# Patient Record
Sex: Female | Born: 1980
Health system: Southern US, Community
[De-identification: ages and names within clinical notes are randomized; demographics above are authoritative.]

## PROBLEM LIST (undated history)

## (undated) ENCOUNTER — Ambulatory Visit (HOSPITAL_COMMUNITY): Admission: EM

## (undated) ENCOUNTER — Ambulatory Visit: Admission: EM | Source: Home / Self Care

## (undated) DIAGNOSIS — D649 Anemia, unspecified: Secondary | ICD-10-CM

## (undated) DIAGNOSIS — F419 Anxiety disorder, unspecified: Secondary | ICD-10-CM

## (undated) DIAGNOSIS — K219 Gastro-esophageal reflux disease without esophagitis: Secondary | ICD-10-CM

## (undated) DIAGNOSIS — F410 Panic disorder [episodic paroxysmal anxiety] without agoraphobia: Secondary | ICD-10-CM

## (undated) DIAGNOSIS — R42 Dizziness and giddiness: Secondary | ICD-10-CM

## (undated) HISTORY — PX: NO PAST SURGERIES: SHX2092

## (undated) HISTORY — DX: Gastro-esophageal reflux disease without esophagitis: K21.9

---
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
## 2002-01-24 ENCOUNTER — Inpatient Hospital Stay (HOSPITAL_COMMUNITY): Admission: AD | Admit: 2002-01-24 | Discharge: 2002-01-27 | Payer: Self-pay | Admitting: Obstetrics

## 2002-12-31 ENCOUNTER — Other Ambulatory Visit: Admission: RE | Admit: 2002-12-31 | Discharge: 2002-12-31 | Payer: Self-pay | Admitting: Obstetrics and Gynecology

## 2003-07-11 ENCOUNTER — Inpatient Hospital Stay (HOSPITAL_COMMUNITY): Admission: AD | Admit: 2003-07-11 | Discharge: 2003-07-11 | Payer: Self-pay | Admitting: Obstetrics and Gynecology

## 2003-07-11 ENCOUNTER — Inpatient Hospital Stay (HOSPITAL_COMMUNITY): Admission: AD | Admit: 2003-07-11 | Discharge: 2003-07-13 | Payer: Self-pay | Admitting: Obstetrics and Gynecology

## 2003-10-24 ENCOUNTER — Emergency Department (HOSPITAL_COMMUNITY): Admission: EM | Admit: 2003-10-24 | Discharge: 2003-10-24 | Payer: Self-pay | Admitting: Emergency Medicine

## 2004-06-15 ENCOUNTER — Emergency Department (HOSPITAL_COMMUNITY): Admission: EM | Admit: 2004-06-15 | Discharge: 2004-06-15 | Payer: Self-pay | Admitting: Emergency Medicine

## 2004-06-21 ENCOUNTER — Inpatient Hospital Stay (HOSPITAL_COMMUNITY): Admission: AD | Admit: 2004-06-21 | Discharge: 2004-06-21 | Payer: Self-pay | Admitting: Obstetrics and Gynecology

## 2004-06-26 ENCOUNTER — Emergency Department (HOSPITAL_COMMUNITY): Admission: EM | Admit: 2004-06-26 | Discharge: 2004-06-26 | Payer: Self-pay | Admitting: Emergency Medicine

## 2004-07-16 ENCOUNTER — Inpatient Hospital Stay (HOSPITAL_COMMUNITY): Admission: AD | Admit: 2004-07-16 | Discharge: 2004-07-16 | Payer: Self-pay | Admitting: Obstetrics and Gynecology

## 2004-09-17 ENCOUNTER — Emergency Department (HOSPITAL_COMMUNITY): Admission: EM | Admit: 2004-09-17 | Discharge: 2004-09-17 | Payer: Self-pay | Admitting: Emergency Medicine

## 2004-09-19 ENCOUNTER — Ambulatory Visit: Payer: Self-pay | Admitting: *Deleted

## 2004-09-19 ENCOUNTER — Ambulatory Visit: Payer: Self-pay

## 2006-12-18 ENCOUNTER — Inpatient Hospital Stay (HOSPITAL_COMMUNITY): Admission: AD | Admit: 2006-12-18 | Discharge: 2006-12-18 | Payer: Self-pay | Admitting: Family Medicine

## 2007-03-24 ENCOUNTER — Emergency Department (HOSPITAL_COMMUNITY): Admission: EM | Admit: 2007-03-24 | Discharge: 2007-03-24 | Payer: Self-pay | Admitting: Emergency Medicine

## 2007-06-28 ENCOUNTER — Inpatient Hospital Stay (HOSPITAL_COMMUNITY): Admission: AD | Admit: 2007-06-28 | Discharge: 2007-06-28 | Payer: Self-pay | Admitting: Obstetrics

## 2007-07-17 ENCOUNTER — Inpatient Hospital Stay (HOSPITAL_COMMUNITY): Admission: AD | Admit: 2007-07-17 | Discharge: 2007-07-17 | Payer: Self-pay | Admitting: Obstetrics

## 2007-07-31 ENCOUNTER — Ambulatory Visit (HOSPITAL_COMMUNITY): Admission: RE | Admit: 2007-07-31 | Discharge: 2007-07-31 | Payer: Self-pay | Admitting: Obstetrics and Gynecology

## 2007-09-08 ENCOUNTER — Ambulatory Visit (HOSPITAL_COMMUNITY): Admission: RE | Admit: 2007-09-08 | Discharge: 2007-09-08 | Payer: Self-pay | Admitting: Obstetrics and Gynecology

## 2007-09-13 ENCOUNTER — Ambulatory Visit: Payer: Self-pay | Admitting: Obstetrics and Gynecology

## 2007-09-13 ENCOUNTER — Inpatient Hospital Stay (HOSPITAL_COMMUNITY): Admission: AD | Admit: 2007-09-13 | Discharge: 2007-09-13 | Payer: Self-pay | Admitting: Internal Medicine

## 2007-10-10 ENCOUNTER — Ambulatory Visit (HOSPITAL_COMMUNITY): Admission: RE | Admit: 2007-10-10 | Discharge: 2007-10-10 | Payer: Self-pay | Admitting: Obstetrics and Gynecology

## 2008-01-07 ENCOUNTER — Inpatient Hospital Stay (HOSPITAL_COMMUNITY): Admission: RE | Admit: 2008-01-07 | Discharge: 2008-01-09 | Payer: Self-pay | Admitting: Obstetrics and Gynecology

## 2008-03-11 ENCOUNTER — Emergency Department (HOSPITAL_COMMUNITY): Admission: EM | Admit: 2008-03-11 | Discharge: 2008-03-11 | Payer: Self-pay | Admitting: Emergency Medicine

## 2008-04-13 ENCOUNTER — Emergency Department (HOSPITAL_COMMUNITY): Admission: EM | Admit: 2008-04-13 | Discharge: 2008-04-13 | Payer: Self-pay | Admitting: Emergency Medicine

## 2008-04-17 IMAGING — US US OB DETAIL+14 WK
1 series · 14 of 28 positions shown · non-contrast
Comparison: none

OBSTETRICAL ULTRASOUND:
 This ultrasound was performed in The [HOSPITAL], and the [facility] report will be stored to [REDACTED] PACS.

[Series 1: us ob detail+14 wk · 14 of 83 slices shown]
[im 4/83]
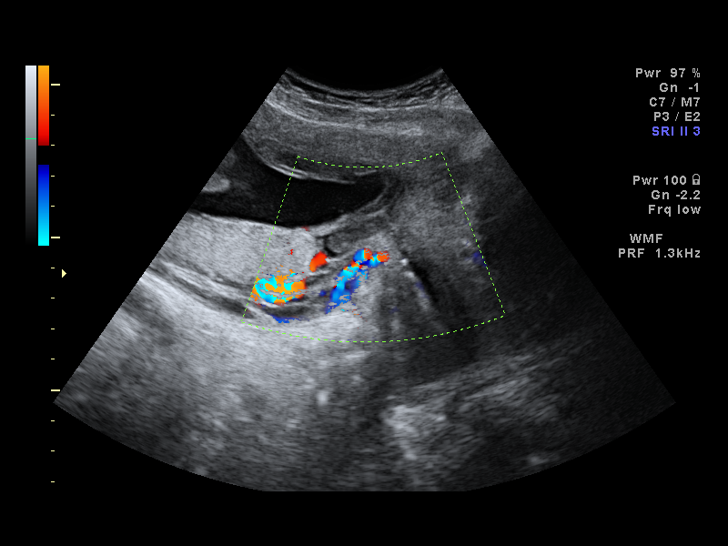
[im 10/83]
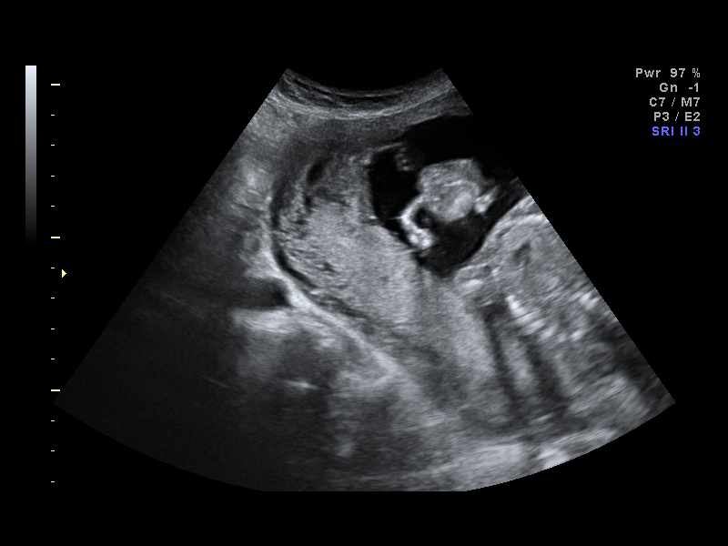
[im 16/83]
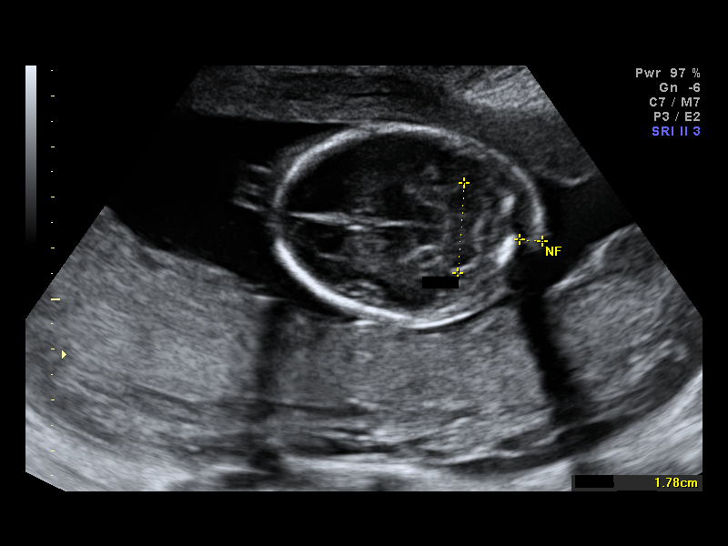
[im 22/83]
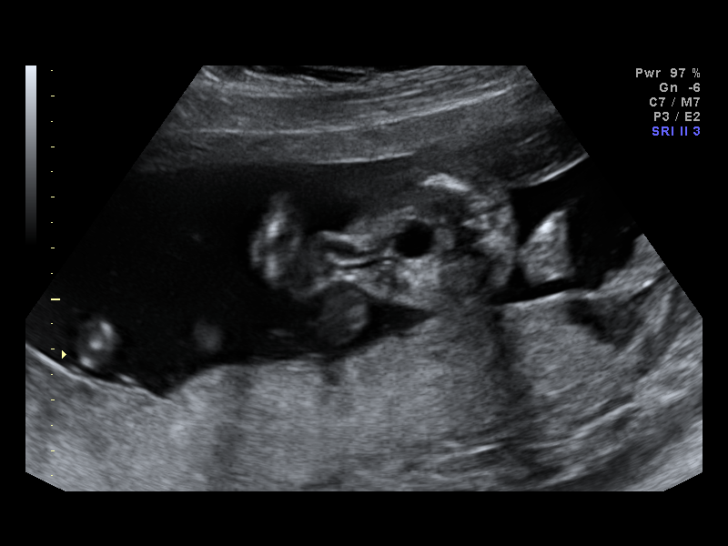
[im 28/83]
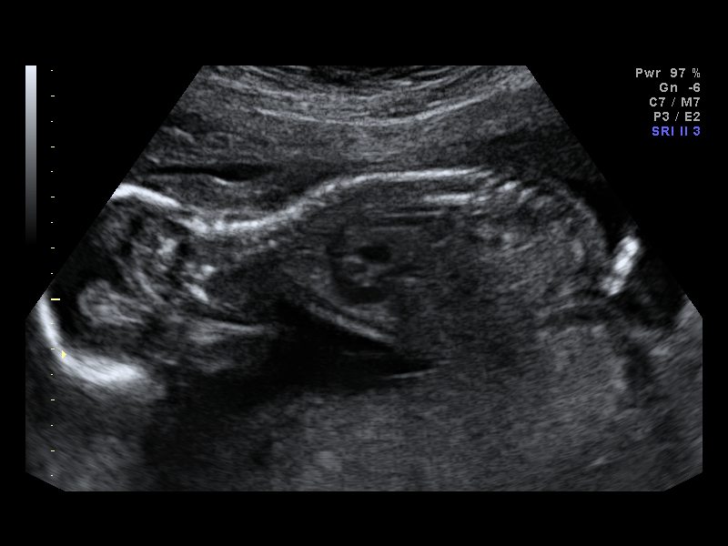
[im 34/83]
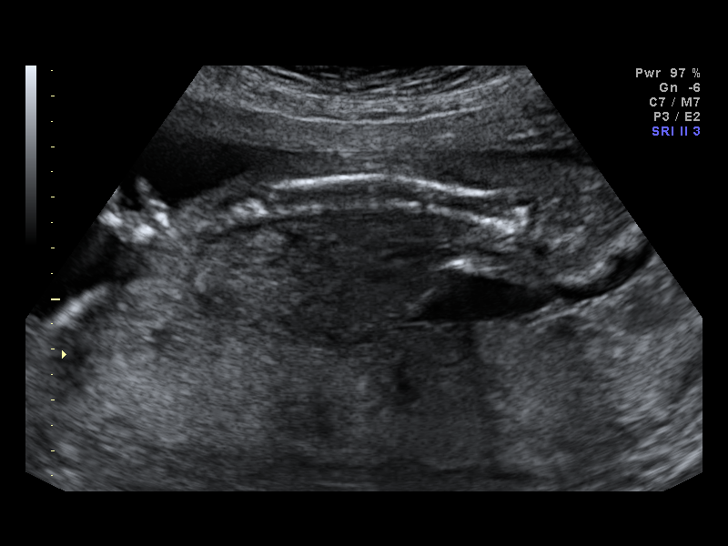
[im 40/83]
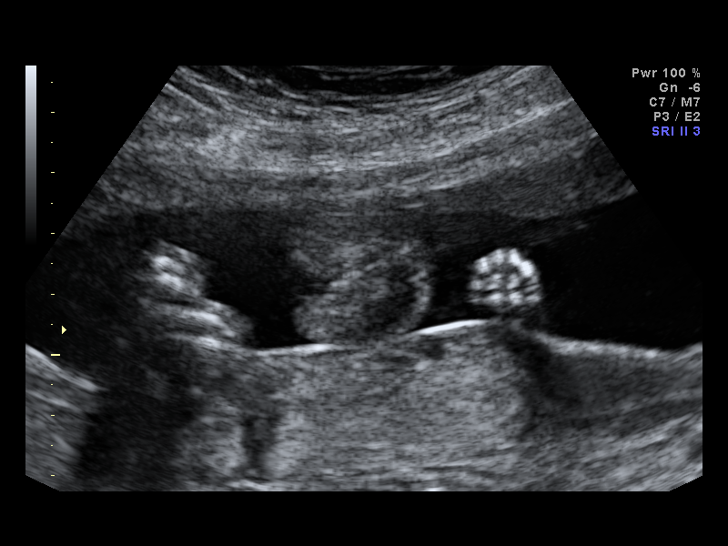
[im 46/83]
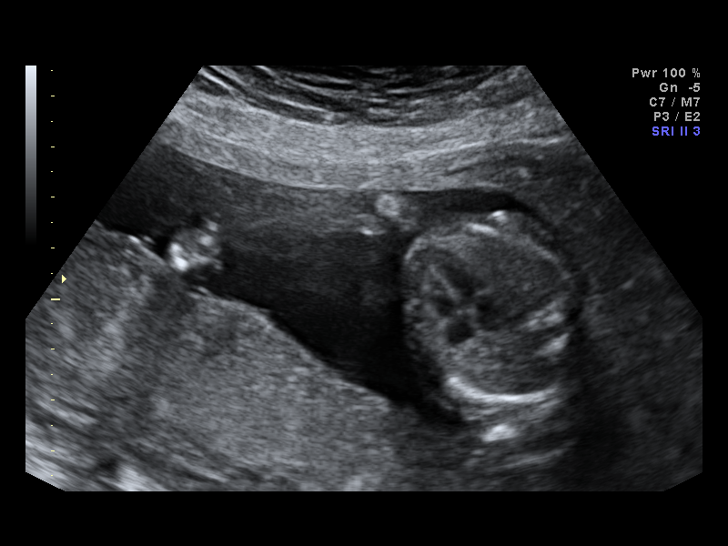
[im 52/83]
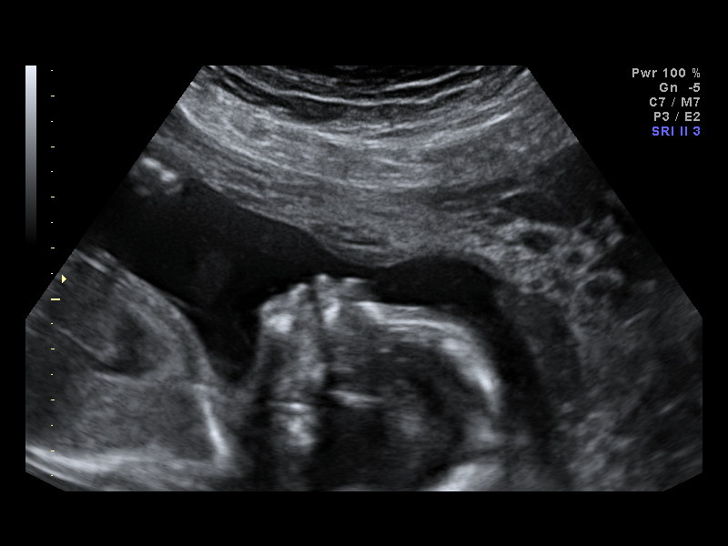
[im 58/83]
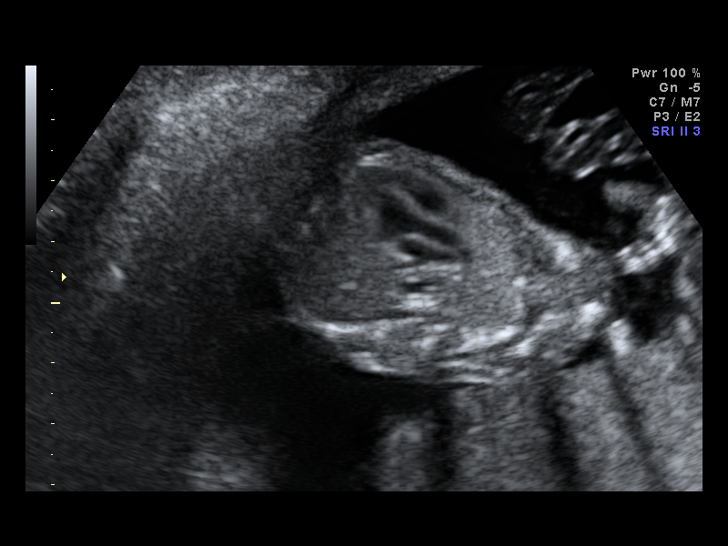
[im 64/83]
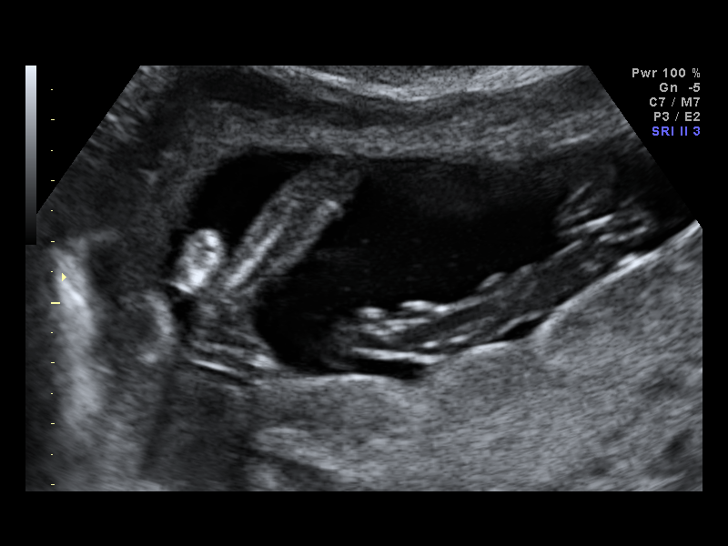
[im 70/83]
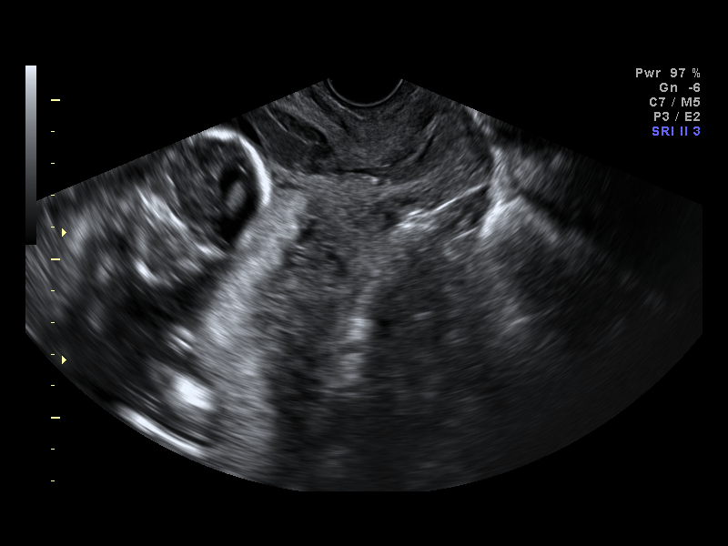
[im 76/83]
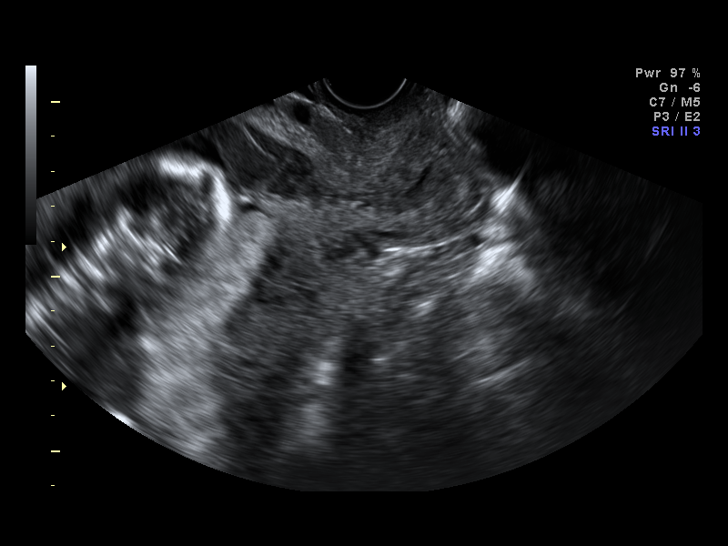
[im 83/83]
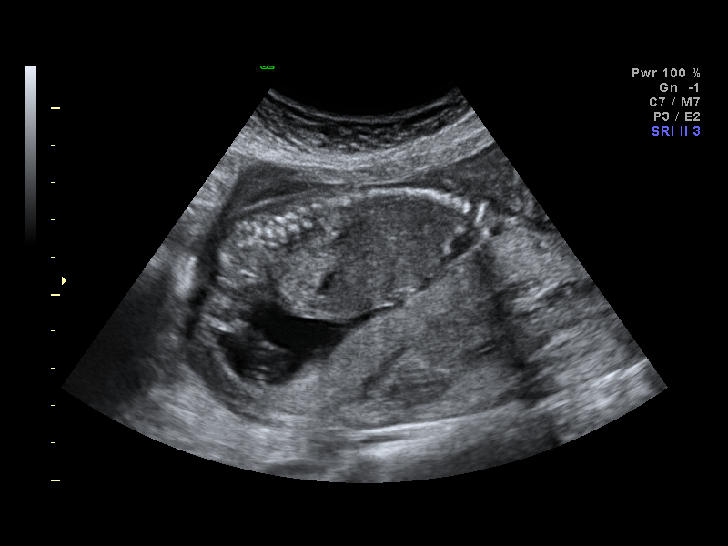

[14 of 28 positions shown; findings below may reference images not displayed]

IMPRESSION: The [facility] report has also been faxed to the ordering physician.

## 2008-04-19 ENCOUNTER — Emergency Department (HOSPITAL_COMMUNITY): Admission: EM | Admit: 2008-04-19 | Discharge: 2008-04-19 | Payer: Self-pay | Admitting: Family Medicine

## 2010-08-13 ENCOUNTER — Encounter: Payer: Self-pay | Admitting: Obstetrics and Gynecology

## 2010-12-05 NOTE — H&P (Signed)
NAMEDEMYA, SCRUGGS            ACCOUNT NO.:  [id number]   MEDICAL RECORD NO.:  [id number]          PATIENT TYPE:  INP   LOCATION:  9164                          FACILITY:  [facility]   PHYSICIAN:  Naima A. Dillard, M.D. DATE OF BIRTH:  11/10/1980   DATE OF ADMISSION:  01/07/2008  DATE OF DISCHARGE:                              HISTORY & PHYSICAL   Ms. Suman is a 30 year old gravida 4, para 2-0-1-2 at 40-6/7 weeks,  who presents for induction secondary to post dates.  She reports  positive fetal movement.  Denies any leaking or bleeding.  Denies any  PIH symptoms.  Her pregnancy has been remarkable for:  1.  Positive  group B Streptococcus; 2.  Marginal cord insertion; 3.  History of a  previa.   PRENATAL LABORATORIES:  Blood type is O positive, Rh antibody negative,  VDRL nonreactive, rubella titer positive, hepatitis B surface antigen  negative, HIV nonreactive.  Sickle test was negative.  Pap was normal in  November 2008.  Quadruple screen was normal.  The patient had a PT, PTT,  and INR done in January 2009 which were normal.  I am unsure of why  those were done.  She had a normal Glucola.  Group B Streptococcus  culture was positive at 36 weeks.   HISTORY OF PRESENT PREGNANCY:  The patient entered care at approximately  28 weeks at [facility].  She had had an ultrasound at [facility] just prior to that time, with estimated fetal weight at  54th percentile, posterior placenta, with a marginal cord insertion.  She had been followed at Dr. Elsie Stain office prior to transferring to  [facility].  At 35 weeks, she had another ultrasound at [facility].  There was a marginal cord insertion.  Still, estimated fetal  weight was at the 63rd percentile, with normal fluid. She had a Glucola  at 36 weeks, because she had not had it at Dr. Elsie Stain office. GC and  Chlamydia cultures were negative at 35 weeks.  She had some decreased  fetal movement at 37  weeks.  She had an NST that was reactive.  Cervix  at that time was 1, 50%, vertex -2.  The cervical exam that Dr. Normand Sloop  performed approximately 1 week ago showed her cervix to be 3 cm.  She  has had no other complications.   OBSTETRICAL HISTORY:  In 2003, she had a vaginal birth of a female infant,  weight 8 pounds 6 ounces, at 41 weeks.  She had a not long labor.  She  had epidural anesthesia, but had no complications.  In 2004, she had a  vaginal birth of a female infant, weight 6 pounds, at 41 weeks.  She had a  not long labor, per her report. Epidural anesthesia.  She had no  complications.  In 2006, she had a 5-week TAB in [place].   MEDICAL HISTORY:  1. She is a previous patch and Yasmin user.  2. She reports the usual childhood illnesses.  3. She has had occasional UTIs.   SURGICAL HISTORY:  Wisdom teeth x4.  Only other hospitalization was for childbirth.   She has no known medication allergies.   FAMILY HISTORY:  Her mother has hypertension.  Both her son have sickle  cell trait.  Her mother has chronic bronchitis.  Her father has  seizures.  Her paternal grandmother has Alzheimer's.  Her mother has  breast cancer.  She is not sure of the timing of that diagnosis.   GENETIC HISTORY:  Remarkable the patient's brother having some type of  spina bifida, and the patient's sons x2 with sickle cell trait.   SOCIAL HISTORY:  The patient is single.  Father of the baby is involved  and supportive.  His name is [employer]. The patient has 2 years  of college.  She is employed at a daycare.  Her partner has his GED.  He  is a Nature conservation officer.  She has been followed by the physician service at [facility].  She denies any alcohol, drug, or tobacco use during this  pregnancy.   PHYSICAL EXAMINATION:  VITAL SIGNS:  Stable.  The patient is febrile.  HEENT: Within normal limits.  LUNGS:  Breath sounds are clear.  HEART:  Regular rate and rhythm, without murmur.  BREASTS:   Soft and nontender.  ABDOMEN:  Fundal height is approximately 39 cm.  Estimated fetal weight  is 7-8 pounds.  PELVIC:  Uterine contractions are occasional and mild.  Cervix on the  last exam was 3 cm, vertex by Dr. Redmond Baseman exam.  Fetal heart rate is  reactive.  There was one mild variable noted with a contraction.  Contractions are occasional and mild.  EXTREMITIES:  Deep tendon reflexes are 2+, without clonus.  There is a  trace edema noted.   IMPRESSION:  1. Intrauterine pregnancy at 40-6/7 weeks.  2. Post dates.  3. Positive group B Streptococcus.  4. Marginal cord insertion.   PLAN:  1. Admit to birthing suite for consult with Dr. Jaymes Graff as      attending physician.  2. Routine physician orders.  3. Plan group B Streptococcus prophylaxis with penicillin G.  4. The patient plans epidural as labor progresses.      Renaldo Reel Emilee Hero, C.N.M.      Naima A. Normand Sloop, M.D.  Electronically Signed    VLL/MEDQ  D:  01/07/2008  T:  01/07/2008  Job:  [id number]

## 2010-12-08 NOTE — H&P (Signed)
NAME:  Breanna Thomas, Breanna Thomas                      ACCOUNT NO.:  [id number]   MEDICAL RECORD NO.:  [id number]                   PATIENT TYPE:   LOCATION:                                       [facility]:  [facility]   PHYSICIAN:  Naima A. Dillard, M.D.              DATE OF BIRTH:  06-28-1981   DATE OF ADMISSION:  07/11/2003  DATE OF DISCHARGE:                                HISTORY & PHYSICAL   HISTORY:  Breanna Thomas is a 30 year old single black female gravida 2, para  1-0-0-1 at 40-6/7 who presents with uterine contractions every three to five  minutes for one hour.  She denies bleeding, leaking, headache, nausea and  vomiting, visual disturbances.  She reports positive fetal movement.  She  rates her pain as a 4/10 on the pain scale now.  Her pregnancy has been  followed by the [facility] OB[facility]GYN M.D. service and has been remarkable  for questionable last menstrual period, history of frequent UTIs, group B  Strep negative.  She was evaluated for labor at noon today and her cervical  examination was 2 and 60% with no change after ambulation.  Upon arrival to  the hospital this evening, her cervix was 2.5 cm, 70%, vertex -2/-3 with  intact bag of waters.  She declined ambulation and also declined going home  because of the pain.  The patient spoke with Dr. Normand Sloop and was offered  morphine rest.  Her prenatal laboratories were collected on December 31, 2002.  Hemoglobin 10.1, hematocrit 32.0, platelets 280,000.  Blood type O+.  Antibody negative.  RPR nonreactive.  Rubella immune.  Hepatitis B surface  antigen negative.  HIV nonreactive.  Pap within normal limits.  Gonorrhea  negative.  Chlamydia negative.  February 08, 2003 her quad screen was within  normal limits.  Her one-hour Glucola on January 04, 2003 was 143 and her  hemoglobin at that time was 9.1.  Her three-hour glucose tolerance test on  May 21, 2003 was within normal limits.  Culture of the vaginal tract for  group B Strep in November  was negative.   HISTORY OF PRESENT PREGNANCY:  She presented for care at [facility] on  December 31, 2002 at [redacted] weeks gestation.  Pregnancy ultrasonography at [redacted] weeks  gestation showed growth consistent with [facility] of July 05, 2003.  Pregnancy  ultrasonography at [redacted] weeks gestation.  __________ dates showed EFW in the  50th-75th percentile with normal fluid.  Rest of her prenatal care was  unremarkable.   PAST OBSTETRICAL HISTORY:  She is a gravida 2, para 1-0-0-1.  In July of  2003, she vaginally delivered a female infant at [redacted] weeks gestation weighing 8  pounds 6 ounces after 16 hours of labor.  She had an epidural for anesthesia  and there were no complications.   ALLERGIES:  She has no medication allergies.   PAST MEDICAL HISTORY:  She reports having had the  usual childhood illnesses.  She has frequent urinary tract infections.   PAST SURGICAL HISTORY:  Wisdom teeth extraction at the age of 52.   FAMILY HISTORY:  Remarkable for parents with history of hypertension, father  with history of seizure, mother with breast cancer.   GENETIC HISTORY:  Remarkable for father of the baby with positive sickle  cell trait.   SOCIAL HISTORY:  The patient is single.  Father of the baby is involved.  His name is Therapist, nutritional.  The patient has 14 years of education and is unemployed  presently.  Father of the baby has 13 years of education and works at [employer].  They deny any alcohol, tobacco, or illicit drug use since  a positive pregnancy test.   PHYSICAL EXAMINATION:  VITAL SIGNS:  Stable.  She is afebrile.  HEENT:  Grossly within normal limits.  CHEST:  Clear to auscultation.  HEART:  Regular rate and rhythm.  ABDOMEN:  Gravid in contour with fundal height extending approximately 39 cm  to the pubic symphysis.  Fetal heart rate is reactive and reassuring with  moderate variables.  Uterine contractions are irregular.  PELVIC:  Cervical examination is 6 cm, 90%, vertex, -1 after  morphine rest.  EXTREMITIES:  Within normal limits.   ASSESSMENT:  1. Intrauterine pregnancy at term.  2. Active labor.  3. Group B Strep negative.   PLAN:  1. Admit to birthing suites per Dr. Normand Sloop.  2. Routine M.D. orders.  3. The patient plans epidural.  4. Anticipate normal spontaneous vaginal birth.     Cam Hai, C.N.M.                     Naima A. Normand Sloop, M.D.    KS/MEDQ  D:  07/12/2003  T:  07/12/2003  Job:  [id number]

## 2011-04-19 LAB — CBC
HCT: 27.6 — ABNORMAL LOW
Hemoglobin: 8.9 — ABNORMAL LOW
MCHC: 31.9
MCHC: 32.3
MCV: 79.5
Platelets: 223
RDW: 16.7 — ABNORMAL HIGH

## 2011-12-04 ENCOUNTER — Telehealth: Payer: Self-pay | Admitting: Obstetrics and Gynecology

## 2011-12-04 NOTE — Telephone Encounter (Signed)
Triage/pt not seen since 09

## 2011-12-05 NOTE — Telephone Encounter (Signed)
SR pt 

## 2011-12-07 NOTE — Telephone Encounter (Signed)
Had Implanon put in at [facility] and asking if spotting is normal.  Told pt that it is normal and can take 3 - 6 cycles to regulate.  Advised pt to make appt if bleeding no better by then.  Pt agreeable.  ld

## 2012-03-24 ENCOUNTER — Emergency Department (HOSPITAL_COMMUNITY)
Admission: EM | Admit: 2012-03-24 | Discharge: 2012-03-24 | Disposition: A | Discharge status: Home / Self Care | Payer: Medicaid Other | Attending: Emergency Medicine | Admitting: Emergency Medicine

## 2012-03-24 ENCOUNTER — Encounter (HOSPITAL_COMMUNITY): Payer: Self-pay | Admitting: Emergency Medicine

## 2012-03-24 ENCOUNTER — Emergency Department (HOSPITAL_COMMUNITY): Payer: Medicaid Other

## 2012-03-24 DIAGNOSIS — R42 Dizziness and giddiness: Secondary | ICD-10-CM | POA: Insufficient documentation

## 2012-03-24 DIAGNOSIS — R51 Headache: Secondary | ICD-10-CM | POA: Insufficient documentation

## 2012-03-24 HISTORY — DX: Anemia, unspecified: D64.9

## 2012-03-24 HISTORY — DX: Dizziness and giddiness: R42

## 2012-03-24 LAB — URINALYSIS, ROUTINE W REFLEX MICROSCOPIC
Bilirubin Urine: NEGATIVE
Glucose, UA: NEGATIVE mg/dL
Hgb urine dipstick: NEGATIVE
Ketones, ur: NEGATIVE mg/dL
Leukocytes, UA: NEGATIVE
Nitrite: NEGATIVE
Protein, ur: NEGATIVE mg/dL
Specific Gravity, Urine: 1.016 (ref 1.005–1.030)
Urobilinogen, UA: 1 mg/dL (ref 0.0–1.0)
pH: 6 (ref 5.0–8.0)

## 2012-03-24 LAB — CBC WITH DIFFERENTIAL/PLATELET
Basophils Absolute: 0 10*3/uL (ref 0.0–0.1)
Basophils Relative: 0 % (ref 0–1)
Eosinophils Absolute: 0.1 10*3/uL (ref 0.0–0.7)
Eosinophils Relative: 1 % (ref 0–5)
HCT: 36.5 % (ref 36.0–46.0)
Hemoglobin: 11.6 g/dL — ABNORMAL LOW (ref 12.0–15.0)
Lymphocytes Relative: 23 % (ref 12–46)
Lymphs Abs: 1.6 10*3/uL (ref 0.7–4.0)
MCH: 28.1 pg (ref 26.0–34.0)
MCHC: 31.8 g/dL (ref 30.0–36.0)
MCV: 88.4 fL (ref 78.0–100.0)
Monocytes Absolute: 0.5 10*3/uL (ref 0.1–1.0)
Monocytes Relative: 7 % (ref 3–12)
Neutro Abs: 4.8 10*3/uL (ref 1.7–7.7)
Neutrophils Relative %: 69 % (ref 43–77)
Platelets: 250 10*3/uL (ref 150–400)
RBC: 4.13 MIL/uL (ref 3.87–5.11)
RDW: 12.1 % (ref 11.5–15.5)
WBC: 6.9 10*3/uL (ref 4.0–10.5)

## 2012-03-24 LAB — BASIC METABOLIC PANEL
BUN: 9 mg/dL (ref 6–23)
CO2: 26 mEq/L (ref 19–32)
Calcium: 9.2 mg/dL (ref 8.4–10.5)
Chloride: 104 mEq/L (ref 96–112)
Creatinine, Ser: 0.68 mg/dL (ref 0.50–1.10)
GFR calc Af Amer: >90 mL/min (ref >90)
GFR calc non Af Amer: >90 mL/min (ref >90)
Glucose, Bld: 102 mg/dL — ABNORMAL HIGH (ref 70–99)
Potassium: 3.9 mEq/L (ref 3.5–5.1)
Sodium: 138 mEq/L (ref 135–145)

## 2012-03-24 LAB — PREGNANCY, URINE: Preg Test, Ur: NEGATIVE

## 2012-03-24 MED ORDER — KETOROLAC TROMETHAMINE 30 MG/ML IJ SOLN
30.0000 mg | Freq: Once | INTRAMUSCULAR | Status: AC
  Administered 2012-03-24: 30 mg via INTRAVENOUS
  Filled 2012-03-24: qty 1

## 2012-03-24 MED ORDER — MECLIZINE HCL 25 MG PO TABS: 25.0000 mg | ORAL_TABLET | Freq: Three times a day (TID) | ORAL | Status: AC | PRN

## 2012-03-24 MED ORDER — SODIUM CHLORIDE 0.9 % IV BOLUS (SEPSIS)
1000.0000 mL | Freq: Once | INTRAVENOUS | Status: AC
  Administered 2012-03-24: 1000 mL via INTRAVENOUS

## 2012-03-24 MED ORDER — SODIUM CHLORIDE 0.9 % IV SOLN
Freq: Once | INTRAVENOUS | Status: AC
  Administered 2012-03-24: 14:00:00 via INTRAVENOUS

## 2012-03-24 NOTE — ED Notes (Signed)
Pt to CT

## 2012-03-24 NOTE — ED Notes (Addendum)
Pt states she has been "lightheaded" for about two weeks. States she feels a little better when it's cool in the room and worse when it's warm. She is lightheaded when she goes to bed and when she wakes up. Pt went to [facility] on Thursday (4 days ago) and was told she had low BP, might be dehydrated and to increase her fluid intake. States she has been drinking roughly 8 oz of fluid every hour while awake since Thursday without relief.   States she has hx Vertigo, "but this is not vertigo. It feels different."

## 2012-03-24 NOTE — ED Notes (Addendum)
Ambulated independently to restroom. Steady gait.

## 2012-03-24 NOTE — ED Provider Notes (Signed)
History     CSN: [id number]  Arrival date & time 03/24/12  3086   First MD Initiated Contact with Patient 03/24/12 [phone]      Chief Complaint  Patient presents with  . Dizziness    (Consider location/radiation/quality/duration/timing/severity/associated sxs/prior treatment) HPI The patient is a 31 year old female with a history of vertigo that presents to the ED with a chief complaint of dizziness.  This feeling of being "lightheaded" began about 2 weeks ago and comes on intermittently.  She states this is "not the same as vertigo."  The patient also reports intermittent headache for the past two days.  She took acetaminophen for the headache with mild relief.  The patient denies fever, syncope, vision changes, chest pain, SOB, nausea, vomiting, abdominal pain, constipation, diarrhea, weakness, numbness, and urinary frequency.  The patient denies that her dizziness is associated with postural changes.  She reports increasing her fluid intake recently to try and prevent dehydration.  The patient is currently on birth control and is not sexually active.  The patient states that it seems to be worse when she is at work walking a lot.  Past Medical History  Diagnosis Date  . Anemia   . Vertigo     History reviewed. No pertinent past surgical history.  No family history on file.  History  Substance Use Topics  . Smoking status: Never Smoker   . Smokeless tobacco: Never Used  . Alcohol Use: 0.6 oz/week    0 Cans of beer, 1 Glasses of wine, 0 Shots of liquor, 0 Drinks containing 0.5 oz of alcohol per week     Has a glass of wine maybe once a month    OB History    Grav Para Term Preterm Abortions TAB SAB Ect Mult Living                  Review of Systems All other systems negative except as documented in the HPI. All pertinent positives and negatives as reviewed in the HPI.   Allergies  Review of patient's allergies indicates no known allergies.  Home Medications   Current  Outpatient Rx  Name Route Sig Dispense Refill  . ETONOGESTREL 68 MG [place] IMPL Subcutaneous Inject 1 each into the skin once.      BP 110/68  Pulse 84  Temp 99 F (37.2 C) (Oral)  Resp 18  SpO2 99%  Physical Exam  Nursing note and vitals reviewed. Constitutional: She is oriented to person, place, and time. She appears well-developed and well-nourished. No distress.  HENT:  Head: Normocephalic and atraumatic.  Mouth/Throat: Oropharynx is clear and moist. No oropharyngeal exudate.  Eyes: Conjunctivae and EOM are normal. Pupils are equal, round, and reactive to light. Right eye exhibits no discharge. Left eye exhibits no discharge.  Neck: Normal range of motion. Neck supple. No thyromegaly present.  Cardiovascular: Normal rate, regular rhythm, normal heart sounds and intact distal pulses.  Exam reveals no gallop and no friction rub.   No murmur heard. Pulmonary/Chest: Effort normal and breath sounds normal. No respiratory distress. She has no wheezes. She has no rales.  Abdominal: Soft. Bowel sounds are normal. She exhibits no distension and no mass. There is no tenderness. There is no guarding.  Musculoskeletal: She exhibits no edema.  Lymphadenopathy:    She has no cervical adenopathy.  Neurological: She is alert and oriented to person, place, and time. She has normal strength. No sensory deficit. She exhibits normal muscle tone. Coordination and gait  normal. GCS eye subscore is 4. GCS verbal subscore is 5. GCS motor subscore is 6.  Skin: Skin is warm and dry. No rash noted. No erythema.  Psychiatric: She has a normal mood and affect. Her behavior is normal. Judgment and thought content normal.    ED Course  Procedures (including critical care time)  Labs Reviewed  CBC WITH DIFFERENTIAL - Abnormal; Notable for the following:    Hemoglobin 11.6 (*)     All other components within normal limits  BASIC METABOLIC PANEL - Abnormal; Notable for the following:    Glucose, Bld 102 (*)       All other components within normal limits  URINALYSIS, ROUTINE W REFLEX MICROSCOPIC  PREGNANCY, URINE      MDM   Patient presented with 2 weeks of intermittent dizziness. The patient states that this gets worse with working and walking. The patient has normal neurological exam here in the ER. The patient states that this feels like she is swimmy headed and not like her previous vertigo. The patient is given IV fluids here in the ER. The patient has had benign testing except her hgb was slightly low at 11.6. CT scan was normal. Patient is advised to follow up with her PCP.     Carlyle Dolly, PA-C 03/25/12 [phone]

## 2012-03-25 NOTE — ED Provider Notes (Signed)
Medical screening examination/treatment/procedure(s) were performed by non-physician practitioner and as supervising physician I was immediately available for consultation/collaboration.  Jones Skene, M.D.     Jones Skene, MD 03/25/12 1031

## 2013-03-28 ENCOUNTER — Encounter (HOSPITAL_COMMUNITY): Payer: Self-pay | Admitting: Physical Medicine and Rehabilitation

## 2013-03-28 ENCOUNTER — Emergency Department (HOSPITAL_COMMUNITY)
Admission: EM | Admit: 2013-03-28 | Discharge: 2013-03-28 | Disposition: A | Discharge status: Home / Self Care | Payer: BC Managed Care – PPO | Attending: Emergency Medicine | Admitting: Emergency Medicine

## 2013-03-28 ENCOUNTER — Emergency Department (HOSPITAL_COMMUNITY): Payer: BC Managed Care – PPO

## 2013-03-28 DIAGNOSIS — F411 Generalized anxiety disorder: Secondary | ICD-10-CM | POA: Insufficient documentation

## 2013-03-28 DIAGNOSIS — F419 Anxiety disorder, unspecified: Secondary | ICD-10-CM

## 2013-03-28 DIAGNOSIS — Z3202 Encounter for pregnancy test, result negative: Secondary | ICD-10-CM | POA: Insufficient documentation

## 2013-03-28 DIAGNOSIS — R5381 Other malaise: Secondary | ICD-10-CM | POA: Insufficient documentation

## 2013-03-28 DIAGNOSIS — Z862 Personal history of diseases of the blood and blood-forming organs and certain disorders involving the immune mechanism: Secondary | ICD-10-CM | POA: Insufficient documentation

## 2013-03-28 DIAGNOSIS — R110 Nausea: Secondary | ICD-10-CM | POA: Insufficient documentation

## 2013-03-28 DIAGNOSIS — R0602 Shortness of breath: Secondary | ICD-10-CM | POA: Insufficient documentation

## 2013-03-28 HISTORY — DX: Anxiety disorder, unspecified: F41.9

## 2013-03-28 LAB — URINALYSIS, ROUTINE W REFLEX MICROSCOPIC
Glucose, UA: NEGATIVE mg/dL
Hgb urine dipstick: NEGATIVE
Protein, ur: NEGATIVE mg/dL
Specific Gravity, Urine: 1.017 (ref 1.005–1.030)
Urobilinogen, UA: 1 mg/dL (ref 0.0–1.0)

## 2013-03-28 LAB — POCT I-STAT, CHEM 8
BUN: 8 mg/dL (ref 6–23)
Creatinine, Ser: 0.8 mg/dL (ref 0.50–1.10)
Hemoglobin: 13.6 g/dL (ref 12.0–15.0)
Potassium: 4.1 mEq/L (ref 3.5–5.1)
Sodium: 142 mEq/L (ref 135–145)
TCO2: 23 mmol/L (ref 0–100)

## 2013-03-28 LAB — URINE MICROSCOPIC-ADD ON

## 2013-03-28 LAB — POCT PREGNANCY, URINE: Preg Test, Ur: NEGATIVE

## 2013-03-28 MED ORDER — SODIUM CHLORIDE 0.9 % IV BOLUS (SEPSIS)
1000.0000 mL | Freq: Once | INTRAVENOUS | Status: AC
  Administered 2013-03-28: 1000 mL via INTRAVENOUS

## 2013-03-28 MED ORDER — LORAZEPAM 1 MG PO TABS: 1.0000 mg | ORAL_TABLET | Freq: Four times a day (QID) | ORAL | Status: DC | PRN

## 2013-03-28 MED ORDER — LORAZEPAM 2 MG/ML IJ SOLN
1.0000 mg | Freq: Once | INTRAMUSCULAR | Status: AC
  Administered 2013-03-28: 1 mg via INTRAVENOUS
  Filled 2013-03-28: qty 1

## 2013-03-28 NOTE — ED Notes (Signed)
Pt presents to department via [facility] for evaluation of anxiety and palpitations. Ongoing x1 week. States panic attacks and severe anxiety for the past several days. Was seen by PCP and prescribed clonazepam, but unable to get prescription filled. Pt also states nausea, dizziness and diaphoresis. Pt is conscious alert and oriented x4. Ambulatory upon arrival. CBG 106.

## 2013-03-28 NOTE — ED Provider Notes (Signed)
CSN: [id number]     Arrival date & time 03/28/13  6213 History   First MD Initiated Contact with Patient 03/28/13 [phone]     Chief Complaint  Patient presents with  . Anxiety   (Consider location/radiation/quality/duration/timing/severity/associated sxs/prior Treatment) HPI Comments: Patient is a 32 year old female with history of anemia, vertigo, anxiety presents today after having panic attacks for the past week. She reports that these attacks last approximately 10 minutes when they occur. She stopped them by putting ice on her head. She reports it gets worse when she thinks of "her situation". She states that she tends to catastrophize and is concerned that these panic attacks could be more serious. She was at her primary care physician yesterday and was given a prescription for clonazepam. She was unable to fill this prescription because her PCP apparently did not call in the prescription. She also had her thyroid tested yesterday. No fevers, chills, cough, vomiting, abdominal pain, chest pain, pleuritic pain.   The history is provided by the patient. No language interpreter was used.    Past Medical History  Diagnosis Date  . Anemia   . Vertigo   . Anxiety    No past surgical history on file. No family history on file. History  Substance Use Topics  . Smoking status: Never Smoker   . Smokeless tobacco: Never Used  . Alcohol Use: 0.6 oz/week    1 Glasses of wine, 0 Cans of beer, 0 Shots of liquor, 0 Drinks containing 0.5 oz of alcohol per week     Comment: social   OB History   Grav Para Term Preterm Abortions TAB SAB Ect Mult Living                 Review of Systems  Constitutional: Positive for fatigue. Negative for fever and chills.  Respiratory: Positive for shortness of breath. Negative for cough, chest tightness, wheezing and stridor.   Gastrointestinal: Positive for nausea. Negative for vomiting and abdominal pain.  Skin: Negative for rash.  Psychiatric/Behavioral: The  patient is nervous/anxious.   All other systems reviewed and are negative.    Allergies  Review of patient's allergies indicates no known allergies.  Home Medications   Current Outpatient Rx  Name  Route  Sig  Dispense  Refill  . etonogestrel (IMPLANON) 68 MG IMPL implant   Subcutaneous   Inject 1 each into the skin once.          BP 115/78  Pulse 89  Temp(Src) 98.1 F (36.7 C) (Oral)  Resp 16  SpO2 98% Physical Exam  Nursing note and vitals reviewed. Constitutional: She is oriented to person, place, and time. She appears well-developed and well-nourished. No distress.  HENT:  Head: Normocephalic and atraumatic.  Right Ear: External ear normal.  Left Ear: External ear normal.  Nose: Nose normal.  Mouth/Throat: Oropharynx is clear and moist.  Eyes: Conjunctivae are normal.  Neck: Normal range of motion.  Cardiovascular: Normal rate, regular rhythm and normal heart sounds.   Pulmonary/Chest: Effort normal and breath sounds normal. No stridor. No respiratory distress. She has no wheezes. She has no rales.  Abdominal: Soft. She exhibits no distension.  Musculoskeletal: Normal range of motion.  Neurological: She is alert and oriented to person, place, and time. She has normal strength.  Skin: Skin is warm and dry. She is not diaphoretic. No erythema.  Psychiatric: She has a normal mood and affect. Her behavior is normal.    ED Course  Procedures (including critical  care time) Labs Review Labs Reviewed  URINALYSIS, ROUTINE W REFLEX MICROSCOPIC - Abnormal; Notable for the following:    APPearance CLOUDY (*)    Leukocytes, UA MODERATE (*)    All other components within normal limits  URINE MICROSCOPIC-ADD ON - Abnormal; Notable for the following:    Squamous Epithelial / LPF MANY (*)    Bacteria, UA FEW (*)    All other components within normal limits  POCT I-STAT, CHEM 8 - Abnormal; Notable for the following:    Glucose, Bld 105 (*)    All other components within  normal limits  URINE CULTURE  POCT PREGNANCY, URINE    Date: 03/28/2013  Rate: 86  Rhythm: normal sinus rhythm  QRS Axis: normal  Intervals: normal  ST/T Wave abnormalities: nonspecific T wave changes  Conduction Disutrbances:none  Narrative Interpretation:   Old EKG Reviewed: none available    Imaging Review Dg Chest 2 View  03/28/2013   CLINICAL DATA:  Shortness of breath.  EXAM: CHEST  2 VIEW  COMPARISON:  03/11/2008  FINDINGS: The lungs appear clear. The cardiac and mediastinal contours normal.  No pleural effusion identified.  IMPRESSION: No active cardiopulmonary disease.   Electronically Signed   By: Herbie Baltimore   On: 03/28/2013 08:58    MDM   1. Anxiety    Patient presents to the emergency department complaining of symptoms consistent with anxiety.  Patient has a history of same with similar episodes.  The patient is resting comfortably, in no apparent distress and asymptomatic.  Labs, ECG and vital signs reviewed.  No exophthalmos, pregnancy test negative, no signs of UTI.  Stress reducing mechanisms discussed including caffeine intake.  Patient has been referred to psychiatric services for follow-up.  Discharged with a 3 day prescription for Ativan 1mg .  I discussed this case with Dr. Rosalia Hammers who agrees with plan.        Mora Bellman, PA-C 03/28/13 1048

## 2013-03-30 LAB — URINE CULTURE: Colony Count: 55000

## 2013-03-30 NOTE — ED Provider Notes (Signed)
History/physical exam/procedure(s) were performed by non-physician practitioner and as supervising physician I was immediately available for consultation/collaboration. I have reviewed all notes and am in agreement with care and plan.   Hilario Quarry, MD 03/30/13 1245

## 2013-04-06 ENCOUNTER — Emergency Department (HOSPITAL_COMMUNITY): Payer: BC Managed Care – PPO

## 2013-04-06 ENCOUNTER — Emergency Department (HOSPITAL_COMMUNITY)
Admission: EM | Admit: 2013-04-06 | Discharge: 2013-04-06 | Disposition: A | Discharge status: Home / Self Care | Payer: BC Managed Care – PPO | Attending: Emergency Medicine | Admitting: Emergency Medicine

## 2013-04-06 ENCOUNTER — Encounter (HOSPITAL_COMMUNITY): Payer: Self-pay | Admitting: *Deleted

## 2013-04-06 DIAGNOSIS — R000 Tachycardia, unspecified: Secondary | ICD-10-CM | POA: Insufficient documentation

## 2013-04-06 DIAGNOSIS — R5381 Other malaise: Secondary | ICD-10-CM | POA: Insufficient documentation

## 2013-04-06 DIAGNOSIS — Z862 Personal history of diseases of the blood and blood-forming organs and certain disorders involving the immune mechanism: Secondary | ICD-10-CM | POA: Insufficient documentation

## 2013-04-06 DIAGNOSIS — R0789 Other chest pain: Secondary | ICD-10-CM | POA: Insufficient documentation

## 2013-04-06 DIAGNOSIS — F411 Generalized anxiety disorder: Secondary | ICD-10-CM | POA: Insufficient documentation

## 2013-04-06 DIAGNOSIS — R0602 Shortness of breath: Secondary | ICD-10-CM

## 2013-04-06 DIAGNOSIS — R209 Unspecified disturbances of skin sensation: Secondary | ICD-10-CM | POA: Insufficient documentation

## 2013-04-06 LAB — BASIC METABOLIC PANEL
BUN: 6 mg/dL (ref 6–23)
CO2: 25 mEq/L (ref 19–32)
Chloride: 102 mEq/L (ref 96–112)
GFR calc non Af Amer: >90 mL/min (ref >90)
Glucose, Bld: 98 mg/dL (ref 70–99)
Potassium: 4.4 mEq/L (ref 3.5–5.1)
Sodium: 136 mEq/L (ref 135–145)

## 2013-04-06 LAB — CBC WITH DIFFERENTIAL/PLATELET
Basophils Relative: 4 % — ABNORMAL HIGH (ref 0–1)
Eosinophils Relative: 1 % (ref 0–5)
HCT: 35.1 % — ABNORMAL LOW (ref 36.0–46.0)
Hemoglobin: 11.2 g/dL — ABNORMAL LOW (ref 12.0–15.0)
Lymphocytes Relative: 66 % — ABNORMAL HIGH (ref 12–46)
Monocytes Relative: 11 % (ref 3–12)
Neutro Abs: 1.4 10*3/uL — ABNORMAL LOW (ref 1.7–7.7)
Neutrophils Relative %: 18 % — ABNORMAL LOW (ref 43–77)
RBC: 4.03 MIL/uL (ref 3.87–5.11)

## 2013-04-06 LAB — TROPONIN I: Troponin I: <0.3 ng/mL (ref <0.30)

## 2013-04-06 LAB — POCT PREGNANCY, URINE: Preg Test, Ur: NEGATIVE

## 2013-04-06 MED ORDER — LORAZEPAM 2 MG/ML IJ SOLN
1.0000 mg | Freq: Once | INTRAMUSCULAR | Status: AC
  Administered 2013-04-06: 1 mg via INTRAVENOUS
  Filled 2013-04-06: qty 1

## 2013-04-06 MED ORDER — IOHEXOL 350 MG/ML SOLN
100.0000 mL | Freq: Once | INTRAVENOUS | Status: AC | PRN
  Administered 2013-04-06: 100 mL via INTRAVENOUS

## 2013-04-06 MED ORDER — HYDROXYZINE HCL 25 MG PO TABS: 25.0000 mg | ORAL_TABLET | Freq: Four times a day (QID) | ORAL | Status: DC

## 2013-04-06 NOTE — ED Notes (Signed)
Patient transported to CT 

## 2013-04-06 NOTE — ED Notes (Signed)
Pt states for the past 2 weeks her heart has been beating fast when she is sitting or at night when laying down, pt states has a hx of anxiety attacks but this is different, pt states has felt weak and short of breath also, been to doctor x 2 and was told had an irregular heart beat, states heart rate is usually 60-80 but has recently been 100.

## 2013-04-06 NOTE — ED Provider Notes (Signed)
CSN: [id number]     Arrival date & time 04/06/13  0827 History   First MD Initiated Contact with Patient 04/06/13 0840     Chief Complaint  Patient presents with  . Tachycardia  . Anxiety   (Consider location/radiation/quality/duration/timing/severity/associated sxs/prior Treatment) HPI  Breanna Thomas is a 32 y.o. female with past medical history significant for anemia and anxiety complaining of palpitations (sensation that her heart is beating fast and irregular) especially at night when she is sitting or laying down. This is associated with sensation of bilateral head numbness. Patient states it is a typical of her prior anxiety attacks, patient has had associated weakness, chest pain described as sharp and  shortness of breath. Symptoms have been intermittent for 2 weeks. States she has seen her primary care physician and has been told that she has an "irregular" States her rate is normally in the 60s. Patient denies recent travel, mobilizations, history of DVT or PE, family history of early cardiac death, nausea vomiting, change in bowel or bladder habits, dysarthria, ataxia, focal weakness.  Past Medical History  Diagnosis Date  . Anemia   . Vertigo   . Anxiety    History reviewed. No pertinent past surgical history. No family history on file. History  Substance Use Topics  . Smoking status: Never Smoker   . Smokeless tobacco: Never Used  . Alcohol Use: 0.6 oz/week    1 Glasses of wine, 0 Cans of beer, 0 Shots of liquor, 0 Drinks containing 0.5 oz of alcohol per week     Comment: social   OB History   Grav Para Term Preterm Abortions TAB SAB Ect Mult Living                 Review of Systems 10 systems reviewed and found to be negative, except as noted in the HPI   Allergies  Review of patient's allergies indicates no known allergies.  Home Medications   Current Outpatient Rx  Name  Route  Sig  Dispense  Refill  . acetaminophen (TYLENOL) 500 MG tablet   Oral    Take 1,000 mg by mouth every 6 (six) hours as needed for pain.         Marland Kitchen etonogestrel (IMPLANON) 68 MG IMPL implant   Subcutaneous   Inject 1 each into the skin once.         Marland Kitchen LORazepam (ATIVAN) 1 MG tablet   Oral   Take 1 tablet (1 mg total) by mouth every 6 (six) hours as needed for anxiety.   10 tablet   0    BP 111/50  Pulse 111  Temp(Src) 98.1 F (36.7 C) (Oral)  Resp 18  SpO2 99% Physical Exam  Nursing note and vitals reviewed. Constitutional: She is oriented to person, place, and time. She appears well-developed and well-nourished. No distress.  HENT:  Head: Normocephalic.  Mouth/Throat: Oropharynx is clear and moist.  Eyes: Conjunctivae and EOM are normal. Pupils are equal, round, and reactive to light.  Neck: Normal range of motion.  Cardiovascular: Regular rhythm and intact distal pulses.   Mild tachycardia  Pulmonary/Chest: Effort normal and breath sounds normal. No stridor. No respiratory distress. She has no wheezes. She has no rales. She exhibits no tenderness.  Abdominal: Soft. Bowel sounds are normal. She exhibits no distension and no mass. There is no tenderness. There is no rebound and no guarding.  Musculoskeletal: Normal range of motion.  Neurological: She is alert and oriented to person, place, and  time.  Cranial nerves III through XII intact, strength 5 out of 5x4 extremities, negative pronator drift, finger to nose and heel-to-shin coordinated, sensation intact to pinprick and light touch in bilateral upper and lower extremities and also on the face, gait is coordinated and Romberg is negative.     Psychiatric: She has a normal mood and affect.  Seems anxious    ED Course  Procedures (including critical care time) Labs Review Labs Reviewed  CBC WITH DIFFERENTIAL - Abnormal; Notable for the following:    Hemoglobin 11.2 (*)    HCT 35.1 (*)    Neutrophils Relative % 18 (*)    Lymphocytes Relative 66 (*)    Basophils Relative 4 (*)    Neutro  Abs 1.4 (*)    Lymphs Abs 5.1 (*)    Basophils Absolute 0.3 (*)    All other components within normal limits  D-DIMER, QUANTITATIVE - Abnormal; Notable for the following:    D-Dimer, Quant 1.54 (*)    All other components within normal limits  BASIC METABOLIC PANEL  TROPONIN I  POCT PREGNANCY, URINE   Imaging Review Dg Chest 2 View  04/06/2013   *RADIOLOGY REPORT*  Clinical Data: Chest pain, shortness of breath  CHEST - 2 VIEW  Comparison:  03/28/2013  Findings:  The heart size and mediastinal contours are within normal limits.  Both lungs are clear.  The visualized skeletal structures are unremarkable.  IMPRESSION: No active cardiopulmonary disease.   Original Report Authenticated By: Judie Petit. Miles Costain, M.D.   Ct Angio Chest Pe W/cm &/or Wo Cm  04/06/2013   CLINICAL DATA:  Chest pain and shortness of breast  EXAM: CT ANGIOGRAPHY CHEST WITH CONTRAST  TECHNIQUE: Multidetector CT imaging of the chest was performed using the standard protocol during bolus administration of intravenous contrast. Multiplanar CT image reconstructions including MIPs were obtained to evaluate the vascular anatomy.  CONTRAST:  OMNIPAQUE IOHEXOL 350 MG/ML SOLN  COMPARISON:  Chest radiograph April 06, 2013  FINDINGS: There is no demonstrable pulmonary embolus. There is no thoracic aortic aneurysm or dissection.  The lungs are clear. There is no demonstrable pulmonary embolus. Pericardium is not thickened.  In the visualized upper abdomen, the spleen appears prominent, measuring slightly greater than 13 cm in length, although incompletely visualized. Visualized upper abdomen otherwise appears normal.  No blastic or lytic bone lesions are appreciated. Thyroid appears normal.  Review of the MIP images confirms the above findings.  IMPRESSION: Prominent spleen. Lungs clear. No demonstrable pulmonary embolus.   Electronically Signed   By: Bretta Bang   On: 04/06/2013 10:50     Date: 04/06/2013  Rate: 88  Rhythm: normal  sinus rhythm  QRS Axis: normal  Intervals: normal  ST/T Wave abnormalities: Nonspecific T wave changes  Conduction Disutrbances:none  Narrative Interpretation:   Old EKG Reviewed: unchanged  MDM   1. SOB (shortness of breath)     Filed Vitals:   04/06/13 0834  BP: 111/50  Pulse: 111  Temp: 98.1 F (36.7 C)  TempSrc: Oral  Resp: 18  SpO2: 99%     Schyler E Duling is a 32 y.o. female with past medical history of anemia and anxiety disorder complaining of sensation of palpitations associated with chest pain and shortness of breath, worse when laying flat or sitting down for 2 weeks. Patient states that this is not typical of her prior anxiety exacerbations, however, she seems anxious to me. Patient is not anemic, EKG nonischemic, troponin is negative however d-dimer  is elevated at 1.54. CT angiogram shows no signs of PE. I had extensive discussion with the patient and her gra.ndparents about the physical exam and testing today I have advised her that I think this is likely secondary to anxiety and have also advised her that long-term use of Ativan may be addictive, I will write her a prescription for Atarax I have encouraged her to followup with her counselor. Discussed case with attending who agrees with plan and stability to d/c to home.   Medications  LORazepam (ATIVAN) injection 1 mg (1 mg Intravenous Given 04/06/13 0946)  iohexol (OMNIPAQUE) 350 MG/ML injection 100 mL (100 mLs Intravenous Contrast Given 04/06/13 1023)    Pt is hemodynamically stable, appropriate for, and amenable to discharge at this time. Pt verbalized understanding and agrees with care plan. All questions answered. Outpatient follow-up and specific return precautions discussed.    Note: Portions of this report may have been transcribed using voice recognition software. Every effort was made to ensure accuracy; however, inadvertent computerized transcription errors may be present      Wynetta Emery,  PA-C 04/06/13 1615

## 2013-04-06 NOTE — ED Notes (Signed)
Pt taken to restroom, could not get urine sample at this time.

## 2013-04-08 NOTE — ED Provider Notes (Signed)
Medical screening examination/treatment/procedure(s) were performed by non-physician practitioner and as supervising physician I was immediately available for consultation/collaboration.   Laray Anger, DO 04/08/13 2300

## 2013-04-09 ENCOUNTER — Emergency Department (HOSPITAL_COMMUNITY)
Admission: EM | Admit: 2013-04-09 | Discharge: 2013-04-09 | Disposition: A | Discharge status: Home / Self Care | Payer: BC Managed Care – PPO | Attending: Emergency Medicine | Admitting: Emergency Medicine

## 2013-04-09 ENCOUNTER — Encounter (HOSPITAL_COMMUNITY): Payer: Self-pay | Admitting: Emergency Medicine

## 2013-04-09 DIAGNOSIS — Z79899 Other long term (current) drug therapy: Secondary | ICD-10-CM | POA: Insufficient documentation

## 2013-04-09 DIAGNOSIS — R0602 Shortness of breath: Secondary | ICD-10-CM | POA: Insufficient documentation

## 2013-04-09 DIAGNOSIS — F411 Generalized anxiety disorder: Secondary | ICD-10-CM | POA: Insufficient documentation

## 2013-04-09 DIAGNOSIS — Z862 Personal history of diseases of the blood and blood-forming organs and certain disorders involving the immune mechanism: Secondary | ICD-10-CM | POA: Insufficient documentation

## 2013-04-09 DIAGNOSIS — F419 Anxiety disorder, unspecified: Secondary | ICD-10-CM

## 2013-04-09 NOTE — ED Notes (Signed)
Pt was seen on Monday for anxiety attacks , prescribed a medication but it is not working. Pt states she has been having attacks all day.

## 2013-04-09 NOTE — ED Provider Notes (Signed)
CSN: [id number]     Arrival date & time 04/09/13  1612 History  This chart was scribed for non-physician practitioner Renne Crigler, PA-C working with Roney Marion, MD by Danella Maiers, ED Scribe. This patient was seen in room WTR8/WTR8 and the patient's care was started at 4:39 PM.    Chief Complaint  Patient presents with  . Anxiety   The history is provided by the patient. No language interpreter was used.   HPI Comments: Breanna Thomas is a 32 y.o. female who presents to the Emergency Department complaining of constant anxiety from when she woke up this morning until now. She states that for the past two weeks every night around 1230-1am she wakes from sleep gasping for air because of an anxiety attack and she feels like she is going to die. She has been to the ER multiple times where she had her blood and heart checked and was told it was anxiety. She has an appt with a psychiatrist but not until Oct 21st and with her primary care next Wednesday. She tried hydroxyzine with no relief. She was started on escitalopram however it made her stomach hurt so she discontinued after one dose. She has a prescription for lorazepam but claims she does not like how it makes her feel "like a zombie". No SI/HI. The onset of this condition was acute. The course is waxing and waning. Aggravating factors: none. Alleviating factors: none.    Past Medical History  Diagnosis Date  . Anemia   . Vertigo   . Anxiety    History reviewed. No pertinent past surgical history. No family history on file. History  Substance Use Topics  . Smoking status: Never Smoker   . Smokeless tobacco: Never Used  . Alcohol Use: 0.6 oz/week    1 Glasses of wine, 0 Cans of beer, 0 Shots of liquor, 0 Drinks containing 0.5 oz of alcohol per week     Comment: social   OB History   Grav Para Term Preterm Abortions TAB SAB Ect Mult Living                 Review of Systems  Constitutional: Negative for fever.  HENT:  Negative for sore throat and rhinorrhea.   Eyes: Negative for redness.  Respiratory: Positive for shortness of breath. Negative for cough.   Cardiovascular: Negative for chest pain.  Gastrointestinal: Negative for nausea, vomiting, abdominal pain and diarrhea.  Genitourinary: Negative for dysuria.  Musculoskeletal: Negative for myalgias.  Skin: Negative for rash.  Neurological: Negative for headaches.  Psychiatric/Behavioral: Negative for suicidal ideas. The patient is nervous/anxious.     Allergies  Review of patient's allergies indicates no known allergies.  Home Medications   Current Outpatient Rx  Name  Route  Sig  Dispense  Refill  . acetaminophen (TYLENOL) 500 MG tablet   Oral   Take 1,000 mg by mouth every 6 (six) hours as needed for pain.         Marland Kitchen etonogestrel (IMPLANON) 68 MG IMPL implant   Subcutaneous   Inject 1 each into the skin once.         . hydrOXYzine (ATARAX/VISTARIL) 25 MG tablet   Oral   Take 1 tablet (25 mg total) by mouth every 6 (six) hours.   12 tablet   0   . LORazepam (ATIVAN) 0.5 MG tablet   Oral   Take 0.5 mg by mouth 2 (two) times daily as needed for anxiety.  BP 106/66  Pulse 103  Temp(Src) 98.5 F (36.9 C) (Oral)  Resp 20  SpO2 95% Physical Exam  Nursing note and vitals reviewed. Constitutional: She appears well-developed and well-nourished.  HENT:  Head: Normocephalic and atraumatic.  Eyes: Conjunctivae are normal. Right eye exhibits no discharge. Left eye exhibits no discharge.  Neck: Normal range of motion. Neck supple.  Cardiovascular: Normal rate, regular rhythm and normal heart sounds.   Pulmonary/Chest: Effort normal and breath sounds normal.  Abdominal: Soft. There is no tenderness.  Neurological: She is alert.  Skin: Skin is warm and dry.  Psychiatric: She has a normal mood and affect.    ED Course  Procedures (including critical care time) Medications - No data to display  DIAGNOSTIC  STUDIES: Oxygen Saturation is 95% on room air, normal by my interpretation.    COORDINATION OF CARE: 5:44 PM- Discussed treatment plan with pt which includes continuing with the lorazepam until she can see the psychiatrist. Pt agrees to plan.  Vital signs reviewed and are as follows: Filed Vitals:   04/09/13 1800  BP: 115/74  Pulse:   Temp:   Resp:   BP 115/74  Pulse 103  Temp(Src) 98.5 F (36.9 C) (Oral)  Resp 20  SpO2 95%  Patient counseled at length. Urged patient to followup with primary care physician and psychiatrist. She will use Ativan for now.  Labs Review Labs Reviewed - No data to display Imaging Review No results found.  MDM   1. Anxiety    Patient with history of anxiety, recurrent panic attacks. Do not suspect any cardiovascular problems. Patient appears well but anxious. She seems to be have been receiving appropriate care, unfortunately she is unable to tolerate the SSRI that she was prescribed and does not like taking a benzodiazepine medication. She has followup, but anxiety is debilitating. No acute indications for psychiatric intervention. Patient will continue to make due with benzodiazepine and followup as soon as possible.      I personally performed the services described in this documentation, which was scribed in my presence. The recorded information has been reviewed and is accurate.   Renne Crigler, PA-C 04/09/13 2032

## 2013-04-10 ENCOUNTER — Ambulatory Visit (INDEPENDENT_AMBULATORY_CARE_PROVIDER_SITE_OTHER): Payer: BC Managed Care – PPO | Admitting: Neurology

## 2013-04-10 ENCOUNTER — Encounter: Payer: Self-pay | Admitting: Neurology

## 2013-04-10 VITALS — BP 112/78 | HR 124 | Temp 99.1°F | Ht 64.0 in | Wt 211.0 lb

## 2013-04-10 DIAGNOSIS — J988 Other specified respiratory disorders: Secondary | ICD-10-CM

## 2013-04-10 DIAGNOSIS — F410 Panic disorder [episodic paroxysmal anxiety] without agoraphobia: Secondary | ICD-10-CM

## 2013-04-10 DIAGNOSIS — R400 Somnolence: Secondary | ICD-10-CM

## 2013-04-10 DIAGNOSIS — G4710 Hypersomnia, unspecified: Secondary | ICD-10-CM

## 2013-04-10 DIAGNOSIS — R0609 Other forms of dyspnea: Secondary | ICD-10-CM

## 2013-04-10 DIAGNOSIS — F419 Anxiety disorder, unspecified: Secondary | ICD-10-CM | POA: Insufficient documentation

## 2013-04-10 DIAGNOSIS — Z836 Family history of other diseases of the respiratory system: Secondary | ICD-10-CM

## 2013-04-10 DIAGNOSIS — R002 Palpitations: Secondary | ICD-10-CM

## 2013-04-10 DIAGNOSIS — R0683 Snoring: Secondary | ICD-10-CM

## 2013-04-10 DIAGNOSIS — F411 Generalized anxiety disorder: Secondary | ICD-10-CM

## 2013-04-10 NOTE — Patient Instructions (Addendum)
Based on your symptoms and your exam I believe you are at risk for obstructive sleep apnea or OSA, and I think we should proceed with a sleep study to determine whether you do or do not have OSA and how severe it is. If you have more than mild OSA, I want you to consider treatment with CPAP. Please remember, the risks and ramifications of moderate to severe obstructive sleep apnea or OSA are: Cardiovascular disease, including congestive heart failure, stroke, difficult to control hypertension, arrhythmias, and even type 2 diabetes has been linked to untreated OSA. Sleep apnea causes disruption of sleep and sleep deprivation in most cases, which, in turn, can cause recurrent headaches, problems with memory, mood, concentration, focus, and vigilance. Most people with untreated sleep apnea report excessive daytime sleepiness, which can affect their ability to drive. Please do not drive if you feel sleepy.  I will see you back after your sleep study to go over the test results and where to go from there. We will call you after your sleep study and to set up an appointment at the time.   Please remember to try to maintain good sleep hygiene, which means: Keep a regular sleep and wake schedule, try not to exercise or have a meal within 2 hours of your bedtime, try to keep your bedroom conducive for sleep, that is, cool and dark, without light distractors such as an illuminated alarm clock, and refrain from watching TV right before sleep or in the middle of the night and do not keep the TV or radio on during the night. Also, try not to use or play on electronic devices at bedtime, such as your cell phone, tablet PC or laptop. If you like to read at bedtime on an electronic device, try to dim the background light as much as possible.  

## 2013-04-10 NOTE — Progress Notes (Signed)
Subjective:    Patient ID: Breanna Thomas is a 32 y.o. female.  HPI  Huston Foley, MD, PhD [facility] [address] [address] [place], [place] [address]  Dear Dr. Leavy Cella,   I saw your patient, Breanna Thomas, upon your kind request in my neurologic clinic today for initial consultation of her sleep disorder, in particular, concern for obstructive sleep apnea. The patient is accompanied by her father today. As you know, Breanna Thomas is a very pleasant 32 year old right-handed woman with an underlying medical history of anxiety, and depression, who has been waking up with a sense of panic and inability to breathe. She has a long history of anxiety, but has only recently started medication.  She has recently been to the ER several times for Sx of SOB, palpitations, dizziness and a hightened sense of anxiety. She was prescribed 04/03/13, but stopped it after one day, as she felt drowsy, had stomach ache. She was given Ativan by the ER, which actually worked well and she is taking 0.5 mg bid at this time.   Her typical bedtime is reported to be around 9:30 or 10 PM and usual wake time is around 6 AM. Sleep onset typically occurs within 15 minutes. She reports feeling poorly rested upon awakening. She wakes up on an average 1 times in the middle of the night and mostly around 12:30 AM or 1 AM with a sense of lightheadedness, inability to breathe, and palpitations. She has to go to the bathroom 0 times on a typical night. She admits to occasional morning headaches.  She reports excessive daytime somnolence (EDS) and Her Epworth Sleepiness Score (ESS) is 9/24 today. She has not fallen asleep while driving. The patient has not been taking a planned nap, but she is off work until next week and has been taking a nap during the day.  She has an appointment with a psychiatrist on 05/12/13. She has started seeing a therapist.   She may have some snoring, but the degree is unclear  and it is unclear, if she has witnessed apneas. The patient has not noted any RLS symptoms and is not known to kick while asleep or before falling asleep. There is a family history of OSA in her father and sister.  She is a restless sleeper and in the morning, the bed is quite disheveled.   She denies cataplexy, sleep paralysis, hypnagogic or hypnopompic hallucinations, or sleep attacks. She does not report any vivid dreams, nightmares, dream enactments, or parasomnias, such as sleep talking or sleep walking. The patient has not had a sleep study or a home sleep test.  She consumes very little caffeine now.  Her bedroom is usually dark and cool. There is TV in the bedroom and usually it is on at night, sometimes all night.  She is a single mother of 3 younger children ages 54 through 18.  Her Past Medical History Is Significant For: Past Medical History  Diagnosis Date  . Anemia   . Vertigo   . Anxiety     Her Past Surgical History Is Significant For: History reviewed. No pertinent past surgical history.  Her Family History Is Significant For: Family History  Problem Relation Age of Onset  . Seizures    . Heart failure      Her Social History Is Significant For: History   Social History  . Marital Status: Single    Spouse Name: N/A    Number of Children: 3  .  Years of Education: College   Occupational History  .      [employer]   Social History Main Topics  . Smoking status: Never Smoker   . Smokeless tobacco: Never Used  . Alcohol Use: 0.6 oz/week    1 Glasses of wine, 0 Cans of beer, 0 Shots of liquor, 0 Drinks containing 0.5 oz of alcohol per week     Comment: social  . Drug Use: No  . Sexual Activity: Not Currently    Birth Control/ Protection: Implant   Other Topics Concern  . None   Social History Narrative   Patient lives at home alone.   Caffeine Use: none in the past 2 weeks, soda occasionally    Her Allergies Are:  No Known Allergies:   Her  Current Medications Are:  Outpatient Encounter Prescriptions as of 04/10/2013  Medication Sig Dispense Refill  . acetaminophen (TYLENOL) 500 MG tablet Take 1,000 mg by mouth every 6 (six) hours as needed for pain.      Marland Kitchen etonogestrel (IMPLANON) 68 MG IMPL implant Inject 1 each into the skin once.      Marland Kitchen LORazepam (ATIVAN) 0.5 MG tablet Take 0.5 mg by mouth 2 (two) times daily as needed for anxiety.      . [DISCONTINUED] hydrOXYzine (ATARAX/VISTARIL) 25 MG tablet Take 1 tablet (25 mg total) by mouth every 6 (six) hours.  12 tablet  0   No facility-administered encounter medications on file as of 04/10/2013.  :  Review of Systems:  Out of a complete 14 point review of systems, all are reviewed and negative with the exception of these symptoms as listed below:  Review of Systems  Constitutional: Positive for fever and fatigue.  Respiratory: Positive for shortness of breath.   Cardiovascular: Positive for palpitations.  Endocrine: Positive for cold intolerance (feeling cold), heat intolerance (feeling hot) and polydipsia.  Musculoskeletal: Positive for myalgias.  Neurological: Positive for dizziness, numbness and headaches.  Hematological:       Anemia  Psychiatric/Behavioral: Positive for sleep disturbance (sleepiness).       Anxiety Decreased energy Change in appetite Disinterest in activites    Objective:  Neurologic Exam  Physical Exam Physical Examination:   Filed Vitals:   04/10/13 1014  BP: 112/78  Pulse: 124  Temp:     General Examination: The patient is a very pleasant 32 y.o. female in no acute distress. She appears well-developed and well-nourished and well groomed. She is overweight and appears mildly anxious.   HEENT: Normocephalic, atraumatic, pupils are equal, round and reactive to light and accommodation. Funduscopic exam is normal with sharp disc margins noted. Extraocular tracking is good without limitation to gaze excursion or nystagmus noted. Normal smooth  pursuit is noted. Hearing is grossly intact. Tympanic membranes are clear bilaterally. Face is symmetric with normal facial animation and normal facial sensation. Speech is clear with no dysarthria noted. There is no hypophonia. There is no lip, neck/head, jaw or voice tremor. Neck is supple with full range of passive and active motion. There are no carotid bruits on auscultation. Oropharynx exam reveals: mild mouth dryness, adequate dental hygiene and moderate airway crowding, due to larger tongue, narrow airway entry and tonsillar size of 1-2+, she has a very sensitive gag reflex. Mallampati is class III. Tongue protrudes centrally and palate elevates symmetrically.   Chest: Clear to auscultation without wheezing, rhonchi or crackles noted.  Heart: S1+S2+0, regular and normal without murmurs, rubs or gallops noted.   Abdomen: Soft, non-tender  and non-distended with normal bowel sounds appreciated on auscultation.  Extremities: There is no pitting edema in the distal lower extremities bilaterally. Pedal pulses are intact.  Skin: Warm and dry without trophic changes noted. There are no varicose veins.  Musculoskeletal: exam reveals no obvious joint deformities, tenderness or joint swelling or erythema.   Neurologically:  Mental status: The patient is awake, alert and oriented in all 4 spheres. Her memory, attention, language and knowledge are appropriate. There is no aphasia, agnosia, apraxia or anomia. Speech is clear with normal prosody and enunciation. Thought process is linear. Mood is congruent and affect is normal.  Cranial nerves are as described above under HEENT exam. In addition, shoulder shrug is normal with equal shoulder height noted. Motor exam: Normal bulk, strength and tone is noted. There is no drift, tremor or rebound. Romberg is negative. Reflexes are 2+ throughout. Toes are downgoing bilaterally. Fine motor skills are intact with normal finger taps, normal hand movements, normal  rapid alternating patting, normal foot taps and normal foot agility.  Cerebellar testing shows no dysmetria or intention tremor on finger to nose testing. Heel to shin is unremarkable bilaterally. There is no truncal or gait ataxia.  Sensory exam is intact to light touch, pinprick, vibration, temperature sense and proprioception in the upper and lower extremities.  Gait, station and balance are unremarkable. No veering to one side is noted. No leaning to one side is noted. Posture is age-appropriate and stance is narrow based. No problems turning are noted. She turns en bloc. Tandem walk is unremarkable. Intact toe and heel stance is noted.             Assessment and Plan:   In summary, Breanna Thomas is a very pleasant 32 y.o.-year old female with a history and physical exam concerning for obstructive sleep apnea (OSA). She has a positive family history of obstructive sleep apnea and her sister and her father who both use a CPAP machine, in addition she has been waking up with a sense of palpitation, inability to breathe and a sense of panic. While this could be all related to her anxiety attacks, I do believe that we need to do further testing in the form of sleep study because she also has a moderately tight airway and she is overweight and endorses daytime tiredness. I had a long chat with the patient and her father about my findings and the diagnosis, its prognosis and treatment options. We talked about medical treatments and non-pharmacological approaches. I explained in particular the risks and ramifications of untreated moderate to severe OSA, especially with respect to developing cardiovascular disease down the Road, including congestive heart failure, difficult to treat hypertension, cardiac arrhythmias, or stroke. Even type 2 diabetes has in part been linked to untreated OSA. We talked about trying to maintain a healthy lifestyle in general, as well as the importance of weight control. I  encouraged the patient to eat healthy, exercise daily and keep well hydrated, to keep a scheduled bedtime and wake time routine, to not skip any meals and eat healthy snacks in between meals. I have suggested that she touch base with you regarding an alternative to Lexapro which she stopped after only one day due to side effects. She has started therapy. I recommended the following at this time: sleep study with potential positive airway pressure titration.  I explained the sleep test procedure to the patient and also outlined possible surgical and non-surgical treatment options of OSA, including the use  of a custom-made dental device, upper airway surgical options, such as pillar implants, radiofrequency surgery, tongue base surgery, and UPPP. I also explained the CPAP treatment option to the patient, who indicated that she would be willing to try CPAP if the need arises. I explained the importance of being compliant with PAP treatment, not only for insurance purposes but primarily to improve Her symptoms, and for the patient's long term health benefit, including to reduce Her cardiovascular risks. I answered all her questions today and the patient and her father were in agreement. I would like to see her back after the sleep study is completed and encouraged them to call with any interim questions, concerns, problems or updates.   Thank you very much for allowing me to participate in the care of this nice patient. If I can be of any further assistance to you please do not hesitate to call me at [phone].  Sincerely,   Huston Foley, MD, PhD

## 2013-04-12 NOTE — ED Provider Notes (Signed)
Medical screening examination/treatment/procedure(s) were performed by non-physician practitioner and as supervising physician I was immediately available for consultation/collaboration.  Roney Marion, MD 04/12/13 [phone]

## 2013-05-15 ENCOUNTER — Ambulatory Visit (INDEPENDENT_AMBULATORY_CARE_PROVIDER_SITE_OTHER): Payer: BC Managed Care – PPO

## 2013-05-15 DIAGNOSIS — Z836 Family history of other diseases of the respiratory system: Secondary | ICD-10-CM

## 2013-05-15 DIAGNOSIS — F410 Panic disorder [episodic paroxysmal anxiety] without agoraphobia: Secondary | ICD-10-CM

## 2013-05-15 DIAGNOSIS — R0609 Other forms of dyspnea: Secondary | ICD-10-CM

## 2013-05-15 DIAGNOSIS — R002 Palpitations: Secondary | ICD-10-CM

## 2013-05-15 DIAGNOSIS — IMO0002 Reserved for concepts with insufficient information to code with codable children: Secondary | ICD-10-CM

## 2013-05-15 DIAGNOSIS — R400 Somnolence: Secondary | ICD-10-CM

## 2013-05-15 DIAGNOSIS — J988 Other specified respiratory disorders: Secondary | ICD-10-CM

## 2013-05-15 DIAGNOSIS — R0683 Snoring: Secondary | ICD-10-CM

## 2013-05-29 ENCOUNTER — Telehealth: Payer: Self-pay | Admitting: Neurology

## 2013-05-29 NOTE — Telephone Encounter (Signed)
Please call and notify the patient that the recent sleep study did not show any significant obstructive sleep apnea. Please inform patient that I would like to go over the details of the study during a follow up appointment and if not already previously scheduled, arrange a followup appointment (please utilize a followu-up slot). Also, route or fax report to PCP and referring MD, if other than PCP.  Once you have spoken to patient, you can close this encounter.   Thanks,  Treylan Mcclintock, MD, PhD [facility]  

## 2013-06-01 NOTE — Telephone Encounter (Signed)
I called and spoke with the patient to inform her that her recent sleep study didn't reveal any significant obstructive sleep apnea. Patient is scheduled to see Dr. Frances Furbish on 06-24-13 to discuss her finding. I will mail a copy to the patient and fax a copy to Dr. Sharrell Ku office.

## 2013-06-03 ENCOUNTER — Encounter: Payer: Self-pay | Admitting: *Deleted

## 2013-06-24 ENCOUNTER — Ambulatory Visit: Payer: BC Managed Care – PPO | Admitting: Neurology

## 2013-07-22 ENCOUNTER — Encounter (HOSPITAL_COMMUNITY): Payer: Self-pay | Admitting: Family

## 2013-07-22 ENCOUNTER — Inpatient Hospital Stay (HOSPITAL_COMMUNITY)
Admission: AD | Admit: 2013-07-22 | Discharge: 2013-07-22 | Disposition: A | Discharge status: Home / Self Care | Payer: BC Managed Care – PPO | Source: Ambulatory Visit | Attending: Obstetrics & Gynecology | Admitting: Obstetrics & Gynecology

## 2013-07-22 DIAGNOSIS — N939 Abnormal uterine and vaginal bleeding, unspecified: Secondary | ICD-10-CM

## 2013-07-22 DIAGNOSIS — N760 Acute vaginitis: Secondary | ICD-10-CM | POA: Insufficient documentation

## 2013-07-22 DIAGNOSIS — N898 Other specified noninflammatory disorders of vagina: Secondary | ICD-10-CM

## 2013-07-22 DIAGNOSIS — B9689 Other specified bacterial agents as the cause of diseases classified elsewhere: Secondary | ICD-10-CM | POA: Insufficient documentation

## 2013-07-22 DIAGNOSIS — N949 Unspecified condition associated with female genital organs and menstrual cycle: Secondary | ICD-10-CM | POA: Insufficient documentation

## 2013-07-22 DIAGNOSIS — N841 Polyp of cervix uteri: Secondary | ICD-10-CM | POA: Insufficient documentation

## 2013-07-22 DIAGNOSIS — N938 Other specified abnormal uterine and vaginal bleeding: Secondary | ICD-10-CM | POA: Insufficient documentation

## 2013-07-22 DIAGNOSIS — A499 Bacterial infection, unspecified: Secondary | ICD-10-CM | POA: Insufficient documentation

## 2013-07-22 LAB — POCT PREGNANCY, URINE: Preg Test, Ur: NEGATIVE

## 2013-07-22 LAB — URINALYSIS, ROUTINE W REFLEX MICROSCOPIC
Bilirubin Urine: NEGATIVE
Glucose, UA: NEGATIVE mg/dL
Ketones, ur: NEGATIVE mg/dL
Nitrite: NEGATIVE
Specific Gravity, Urine: 1.01 (ref 1.005–1.030)
Urobilinogen, UA: 0.2 mg/dL (ref 0.0–1.0)

## 2013-07-22 LAB — WET PREP, GENITAL: Yeast Wet Prep HPF POC: NONE SEEN

## 2013-07-22 MED ORDER — METRONIDAZOLE 500 MG PO TABS: 500.0000 mg | ORAL_TABLET | Freq: Two times a day (BID) | ORAL | Status: DC

## 2013-07-22 MED ORDER — DOXYCYCLINE HYCLATE 100 MG PO CAPS: 100.0000 mg | ORAL_CAPSULE | Freq: Two times a day (BID) | ORAL | Status: DC

## 2013-07-22 NOTE — MAU Provider Note (Signed)
History     CSN: [id number]  Arrival date and time: 07/22/13 1012   None     Chief Complaint  Patient presents with  . Vaginal Bleeding  . Back Pain  . Dysuria   HPI Pt is not pregnant with Nexplanon/Implanon which pt had inserted in 2013.  Pt has had normal periods since Nexplanon And has had some in between periods.  Pt does not have cramps. Pt has urine that feels warm and feel like she has to pee Constantly- sometimes has left back pain on and off  Past Medical History  Diagnosis Date  . Anemia   . Vertigo   . Anxiety     History reviewed. No pertinent past surgical history.  Family History  Problem Relation Age of Onset  . Seizures    . Heart failure      History  Substance Use Topics  . Smoking status: Never Smoker   . Smokeless tobacco: Never Used  . Alcohol Use: 0.6 oz/week    1 Glasses of wine, 0 Cans of beer, 0 Shots of liquor, 0 Drinks containing 0.5 oz of alcohol per week     Comment: social    Allergies: No Known Allergies  Prescriptions prior to admission  Medication Sig Dispense Refill  . acetaminophen (TYLENOL) 500 MG tablet Take 1,000 mg by mouth every 6 (six) hours as needed for pain.      Marland Kitchen etonogestrel (IMPLANON) 68 MG IMPL implant Inject 1 each into the skin once.      Marland Kitchen LORazepam (ATIVAN) 2 MG tablet Take 2 mg by mouth at bedtime.      . Multiple Vitamin (MULTIVITAMIN WITH MINERALS) TABS tablet Take 1 tablet by mouth daily.        Review of Systems  Constitutional: Negative for fever and chills.  Gastrointestinal: Negative for nausea, vomiting, abdominal pain, diarrhea and constipation.  Genitourinary: Negative for dysuria and urgency.   Physical Exam   Blood pressure 116/80, pulse 85, temperature 98.1 F (36.7 C), temperature source Oral, resp. rate 16, height 5\' 3"  (1.6 m), weight 93.35 kg (205 lb 12.8 oz), last menstrual period 06/22/2013, SpO2 100.00%.  Physical Exam  Nursing note and vitals reviewed. Constitutional: She is  oriented to person, place, and time. She appears well-developed and well-nourished. No distress.  HENT:  Head: Normocephalic.  Eyes: Pupils are equal, round, and reactive to light.  Neck: Normal range of motion. Neck supple.  Cardiovascular: Normal rate.   Respiratory: Effort normal.  GI: Soft.  Genitourinary:  Small amount of pink blood in vault; cervix clean, endocervical polyp friable and partially able to grasp with kelly forceps but not in entirety; uterus NSSC NT; adnexa without palpable enlargement or tenderness  Musculoskeletal: Normal range of motion.  Neurological: She is alert and oriented to person, place, and time.  Skin: Skin is warm and dry.    MAU Course  Procedures Results for orders placed during the hospital encounter of 07/22/13 (from the past 24 hour(s))  URINALYSIS, ROUTINE W REFLEX MICROSCOPIC     Status: Abnormal   Collection Time    07/22/13 10:45 AM      Result Value Range   Color, Urine YELLOW  YELLOW   APPearance CLEAR  CLEAR   Specific Gravity, Urine 1.010  1.005 - 1.030   pH 6.0  5.0 - 8.0   Glucose, UA NEGATIVE  NEGATIVE mg/dL   Hgb urine dipstick LARGE (*) NEGATIVE   Bilirubin Urine NEGATIVE  NEGATIVE  Ketones, ur NEGATIVE  NEGATIVE mg/dL   Protein, ur NEGATIVE  NEGATIVE mg/dL   Urobilinogen, UA 0.2  0.0 - 1.0 mg/dL   Nitrite NEGATIVE  NEGATIVE   Leukocytes, UA NEGATIVE  NEGATIVE  URINE MICROSCOPIC-ADD ON     Status: None   Collection Time    07/22/13 10:45 AM      Result Value Range   WBC, UA 0-2  <3 WBC/hpf   RBC / HPF 0-2  <3 RBC/hpf  POCT PREGNANCY, URINE     Status: None   Collection Time    07/22/13 11:28 AM      Result Value Range   Preg Test, Ur NEGATIVE  NEGATIVE  WET PREP, GENITAL     Status: Abnormal   Collection Time    07/22/13 12:00 PM      Result Value Range   Yeast Wet Prep HPF POC NONE SEEN  NONE SEEN   Trich, Wet Prep NONE SEEN  NONE SEEN   Clue Cells Wet Prep HPF POC MANY (*) NONE SEEN   WBC, Wet Prep HPF POC  MODERATE (*) NONE SEEN      Assessment and Plan  Abnormal bleeding with Nexplanon/Implanon- Doxycycline 100mg  BId for 10 days Endocervical polyp Bacterial vaginosis- flagyl 500mg  BID for 7 days F/u for GYN care- recommended GYN clinic   [facility] 07/22/2013, 11:28 AM

## 2013-07-22 NOTE — MAU Note (Addendum)
32 yo, Implanon since 2013, presents to MAU with c/o vaginal bleeding between periods x 4 months. Denies pain. Reports pain with urination and urinary urgency. New sexual partner within last 6 months.

## 2013-07-22 NOTE — MAU Note (Signed)
Patient states she has had an Implanon for almost 6 years, the last one is due to be changed 2016. Has had periods but is now bleeding in between the periods and having back pain. Has been having a little abdominal pain with urination. Spotting bright red today.

## 2013-07-24 LAB — GC/CHLAMYDIA PROBE AMP
CT Probe RNA: NEGATIVE
GC Probe RNA: NEGATIVE

## 2013-07-26 NOTE — MAU Provider Note (Signed)
Attestation of Attending Supervision of Advanced Practitioner (CNM/NP): Evaluation and management procedures were performed by the Advanced Practitioner under my supervision and collaboration. I have reviewed the Advanced Practitioner's note and chart, and I agree with the management and plan.  LEGGETT,KELLY H. 12:56 PM

## 2013-08-21 ENCOUNTER — Ambulatory Visit (INDEPENDENT_AMBULATORY_CARE_PROVIDER_SITE_OTHER): Payer: BC Managed Care – PPO | Admitting: Family Medicine

## 2013-08-21 ENCOUNTER — Encounter: Payer: Self-pay | Admitting: Family Medicine

## 2013-08-21 VITALS — BP 120/83 | HR 95 | Temp 97.2°F | Ht 64.0 in | Wt 204.9 lb

## 2013-08-21 DIAGNOSIS — N841 Polyp of cervix uteri: Secondary | ICD-10-CM

## 2013-08-21 DIAGNOSIS — N926 Irregular menstruation, unspecified: Secondary | ICD-10-CM

## 2013-08-21 NOTE — Patient Instructions (Signed)
Etonogestrel implant What is this medicine? ETONOGESTREL (et oh noe JES trel) is a contraceptive (birth control) device. It is used to prevent pregnancy. It can be used for up to 3 years. This medicine may be used for other purposes; ask your health care provider or pharmacist if you have questions. COMMON BRAND NAME(S): Implanon, Nexplanon  What should I tell my health care provider before I take this medicine? They need to know if you have any of these conditions: -abnormal vaginal bleeding -blood vessel disease or blood clots -cancer of the breast, cervix, or liver -depression -diabetes -gallbladder disease -headaches -heart disease or recent heart attack -high blood pressure -high cholesterol -kidney disease -liver disease -renal disease -seizures -tobacco smoker -an unusual or allergic reaction to etonogestrel, other hormones, anesthetics or antiseptics, medicines, foods, dyes, or preservatives -pregnant or trying to get pregnant -breast-feeding How should I use this medicine? This device is inserted just under the skin on the inner side of your upper arm by a health care professional. Talk to your pediatrician regarding the use of this medicine in children. Special care may be needed. Overdosage: If you think you've taken too much of this medicine contact a poison control center or emergency room at once. Overdosage: If you think you have taken too much of this medicine contact a poison control center or emergency room at once. NOTE: This medicine is only for you. Do not share this medicine with others. What if I miss a dose? This does not apply. What may interact with this medicine? Do not take this medicine with any of the following medications: -amprenavir -bosentan -fosamprenavir This medicine may also interact with the following medications: -barbiturate medicines for inducing sleep or treating seizures -certain medicines for fungal infections like ketoconazole and  itraconazole -griseofulvin -medicines to treat seizures like carbamazepine, felbamate, oxcarbazepine, phenytoin, topiramate -modafinil -phenylbutazone -rifampin -some medicines to treat HIV infection like atazanavir, indinavir, lopinavir, nelfinavir, tipranavir, ritonavir -St. John's wort This list may not describe all possible interactions. Give your health care provider a list of all the medicines, herbs, non-prescription drugs, or dietary supplements you use. Also tell them if you smoke, drink alcohol, or use illegal drugs. Some items may interact with your medicine. What should I watch for while using this medicine? This product does not protect you against HIV infection (AIDS) or other sexually transmitted diseases. You should be able to feel the implant by pressing your fingertips over the skin where it was inserted. Tell your doctor if you cannot feel the implant. What side effects may I notice from receiving this medicine? Side effects that you should report to your doctor or health care professional as soon as possible: -allergic reactions like skin rash, itching or hives, swelling of the face, lips, or tongue -breast lumps -changes in vision -confusion, trouble speaking or understanding -dark urine -depressed mood -general ill feeling or flu-like symptoms -light-colored stools -loss of appetite, nausea -right upper belly pain -severe headaches -severe pain, swelling, or tenderness in the abdomen -shortness of breath, chest pain, swelling in a leg -signs of pregnancy -sudden numbness or weakness of the face, arm or leg -trouble walking, dizziness, loss of balance or coordination -unusual vaginal bleeding, discharge -unusually weak or tired -yellowing of the eyes or skin Side effects that usually do not require medical attention (Report these to your doctor or health care professional if they continue or are bothersome.): -acne -breast pain -changes in  weight -cough -fever or chills -headache -irregular menstrual bleeding -itching, burning,   and vaginal discharge -pain or difficulty passing urine -sore throat This list may not describe all possible side effects. Call your doctor for medical advice about side effects. You may report side effects to FDA at 1-800-FDA-1088. Where should I keep my medicine? This drug is given in a hospital or clinic and will not be stored at home. NOTE: This sheet is a summary. It may not cover all possible information. If you have questions about this medicine, talk to your doctor, pharmacist, or health care provider.  2014, Elsevier/Gold Standard. (2012-01-14 15:37:45)  

## 2013-08-21 NOTE — Progress Notes (Signed)
Subjective:     Patient ID: Breanna Thomas, female   DOB: 1981/05/16, 33 y.o.   MRN: [id number]  HPI Patient is a 33 yo G3P3003 who presents for f/u from the MAU for vaginal bleeding. In the MAU she was found to have a cervical polyp. Per the patient this was apparently removed. She notes being here today for evaluation of whether or not she has additional polyps. She notes being on implanon since 2013. During this time she has had irregular periods typically once a month, but the bleeding can last for 1-3 weeks at a time. Over the past couple of months she has what she calls her regular period, then she will have bleeding later in the month. She describes all her bleeding as spotting. She notes starting menarche at age 82. She notes some lower pelvic pressure with this. She has no abdominal surgical history. Her last pap smear was in August 2014. She notes she is currently on her period.   Review of Systems see HPI     Objective:   Physical Exam  Constitutional: She appears well-developed and well-nourished. No distress.  HENT:  Head: Normocephalic and atraumatic.  Cardiovascular: Normal rate, regular rhythm and normal heart sounds.   Pulmonary/Chest: Effort normal and breath sounds normal.  Abdominal: Soft. She exhibits no distension. There is no tenderness.  Genitourinary:  Vulva without abnormalities, external vagina and internal vagina without abnormalities, cervix and os visualized without evidence of a polyp, small amount of bleeding noted from the os  BP 120/83  Pulse 95  Temp(Src) 97.2 F (36.2 C)  Ht 5\' 4"  (1.626 m)  Wt 204 lb 14.4 oz (92.942 kg)  BMI 35.15 kg/m2  LMP 08/17/2013      Assessment:     Abnormal uterine bleeding most likely related to implanon     Plan:     Discussed that removal of polyp may resolve this issue, though this is likely due to the implanon and that this is a normal variant reaction. Patient to be scheduled for [place] to evaluate for any  additional structural lesions if bleeding different from her baseline. Discussed that if she becomes unhappy with the irregular bleeding in the future, we could treat this with a course of OCP to her implanon.    Tommi Rumps, MD [facility] PGY-2   I spoke with and examined patient and agree with resident's note and plan of care.  Fredrik Rigger, MD OB Fellow 08/21/2013 9:17 AM

## 2013-08-25 ENCOUNTER — Ambulatory Visit (HOSPITAL_COMMUNITY): Payer: BC Managed Care – PPO

## 2013-09-04 ENCOUNTER — Ambulatory Visit: Payer: BC Managed Care – PPO | Admitting: Family Medicine

## 2013-09-22 ENCOUNTER — Encounter: Payer: Self-pay | Admitting: Neurology

## 2013-09-22 ENCOUNTER — Ambulatory Visit (INDEPENDENT_AMBULATORY_CARE_PROVIDER_SITE_OTHER): Payer: BC Managed Care – PPO | Admitting: Neurology

## 2013-09-22 ENCOUNTER — Encounter (INDEPENDENT_AMBULATORY_CARE_PROVIDER_SITE_OTHER): Payer: Self-pay

## 2013-09-22 VITALS — BP 110/74 | HR 77 | Temp 98.9°F | Ht 64.0 in | Wt 203.0 lb

## 2013-09-22 DIAGNOSIS — R209 Unspecified disturbances of skin sensation: Secondary | ICD-10-CM

## 2013-09-22 DIAGNOSIS — S0990XA Unspecified injury of head, initial encounter: Secondary | ICD-10-CM

## 2013-09-22 DIAGNOSIS — R42 Dizziness and giddiness: Secondary | ICD-10-CM

## 2013-09-22 DIAGNOSIS — F411 Generalized anxiety disorder: Secondary | ICD-10-CM

## 2013-09-22 DIAGNOSIS — R202 Paresthesia of skin: Secondary | ICD-10-CM
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
## 2013-09-22 NOTE — Patient Instructions (Signed)
We will do some blood work, MRI brain and MRA head and call you with the test results.  I can see you back as needed.  I would recommend that you see a psychiatrist and a counselor.

## 2013-09-25 ENCOUNTER — Other Ambulatory Visit (INDEPENDENT_AMBULATORY_CARE_PROVIDER_SITE_OTHER): Payer: BC Managed Care – PPO

## 2013-09-25 DIAGNOSIS — Z0289 Encounter for other administrative examinations: Secondary | ICD-10-CM

## 2013-09-26 LAB — TSH: TSH: 1.29 u[IU]/mL (ref 0.450–4.500)

## 2013-09-26 LAB — SEDIMENTATION RATE: SED RATE: 4 mm/h (ref 0–32)

## 2013-09-26 LAB — C-REACTIVE PROTEIN: CRP: 1 mg/L (ref 0.0–4.9)

## 2013-09-26 LAB — ANA W/REFLEX: Anti Nuclear Antibody(ANA): NEGATIVE

## 2013-09-26 LAB — RHEUMATOID FACTOR: Rhuematoid fact SerPl-aCnc: 7.3 IU/mL (ref 0.0–13.9)

## 2013-09-28 NOTE — Progress Notes (Signed)
Quick Note:  Please advise patient that the recent labs which included thyroid function and inflammatory markers were negative, i.e. In the normal range. Star Age, MD, PhD [facility] ([facility]  ______

## 2013-09-29 ENCOUNTER — Ambulatory Visit (HOSPITAL_COMMUNITY): Payer: BC Managed Care – PPO

## 2013-09-29 NOTE — Progress Notes (Signed)
Quick Note:  Shared normal labs with patient thru VM message per Dr Guadelupe Sabin findings ______

## 2013-10-01 ENCOUNTER — Other Ambulatory Visit: Payer: Self-pay

## 2013-10-15 ENCOUNTER — Encounter (HOSPITAL_COMMUNITY): Payer: Self-pay | Admitting: Emergency Medicine

## 2013-10-15 DIAGNOSIS — H53149 Visual discomfort, unspecified: Secondary | ICD-10-CM | POA: Insufficient documentation

## 2013-10-15 DIAGNOSIS — Z862 Personal history of diseases of the blood and blood-forming organs and certain disorders involving the immune mechanism: Secondary | ICD-10-CM | POA: Insufficient documentation

## 2013-10-15 DIAGNOSIS — IMO0002 Reserved for concepts with insufficient information to code with codable children: Secondary | ICD-10-CM | POA: Insufficient documentation

## 2013-10-15 DIAGNOSIS — F411 Generalized anxiety disorder: Secondary | ICD-10-CM | POA: Insufficient documentation

## 2013-10-15 DIAGNOSIS — Y939 Activity, unspecified: Secondary | ICD-10-CM | POA: Insufficient documentation

## 2013-10-15 DIAGNOSIS — Z79899 Other long term (current) drug therapy: Secondary | ICD-10-CM | POA: Insufficient documentation

## 2013-10-15 DIAGNOSIS — S0990XA Unspecified injury of head, initial encounter: Secondary | ICD-10-CM | POA: Insufficient documentation

## 2013-10-15 DIAGNOSIS — R42 Dizziness and giddiness: Secondary | ICD-10-CM | POA: Insufficient documentation

## 2013-10-15 DIAGNOSIS — Y9289 Other specified places as the place of occurrence of the external cause: Secondary | ICD-10-CM | POA: Insufficient documentation

## 2013-10-15 NOTE — ED Notes (Signed)
Pt. reports frontal headache onset last week , pt. stated she accidentally hit her head against edge of wall last week , no LOC / ambulatory , alert and oriented .

## 2013-10-16 ENCOUNTER — Encounter (HOSPITAL_COMMUNITY): Payer: Self-pay | Admitting: Radiology

## 2013-10-16 ENCOUNTER — Emergency Department (HOSPITAL_COMMUNITY): Payer: Medicaid Other

## 2013-10-16 ENCOUNTER — Emergency Department (HOSPITAL_COMMUNITY)
Admission: EM | Admit: 2013-10-16 | Discharge: 2013-10-16 | Disposition: A | Discharge status: Home / Self Care | Payer: Medicaid Other | Attending: Emergency Medicine | Admitting: Emergency Medicine

## 2013-10-16 DIAGNOSIS — R51 Headache: Secondary | ICD-10-CM

## 2013-10-16 DIAGNOSIS — R519 Headache, unspecified: Secondary | ICD-10-CM

## 2013-10-16 MED ORDER — KETOROLAC TROMETHAMINE 30 MG/ML IJ SOLN
30.0000 mg | Freq: Once | INTRAMUSCULAR | Status: AC
  Administered 2013-10-16: 30 mg via INTRAVENOUS
  Filled 2013-10-16: qty 1

## 2013-10-16 MED ORDER — ONDANSETRON 8 MG PO TBDP: 8.0000 mg | ORAL_TABLET | Freq: Three times a day (TID) | ORAL | Status: DC | PRN

## 2013-10-16 MED ORDER — HYDROCODONE-ACETAMINOPHEN 5-325 MG PO TABS: 1.0000 | ORAL_TABLET | ORAL | Status: DC | PRN

## 2013-10-16 MED ORDER — DIPHENHYDRAMINE HCL 50 MG/ML IJ SOLN
25.0000 mg | Freq: Once | INTRAMUSCULAR | Status: AC
  Administered 2013-10-16: 25 mg via INTRAVENOUS
  Filled 2013-10-16: qty 1

## 2013-10-16 MED ORDER — DEXAMETHASONE SODIUM PHOSPHATE 10 MG/ML IJ SOLN
10.0000 mg | Freq: Once | INTRAMUSCULAR | Status: AC
  Administered 2013-10-16: 10 mg via INTRAVENOUS
  Filled 2013-10-16: qty 1

## 2013-10-16 MED ORDER — METOCLOPRAMIDE HCL 5 MG/ML IJ SOLN
10.0000 mg | Freq: Once | INTRAMUSCULAR | Status: AC
  Administered 2013-10-16: 10 mg via INTRAVENOUS
  Filled 2013-10-16: qty 2

## 2013-10-16 NOTE — ED Provider Notes (Signed)
Medical screening examination/treatment/procedure(s) were conducted as a shared visit with non-physician practitioner(s) and myself.  I personally evaluated the patient during the encounter.   EKG Interpretation None      Feels much better at time of discharge. Dc home. Outpatient follow up. Doubt [place], MD 10/16/13 [phone]

## 2013-10-16 NOTE — Discharge Instructions (Signed)
Your CAT scan and examination today has not shown any signs concerning for any emergent cause of your headache. Your providers today feel that your headache may be related to your head injury and possible mild concussion. Please followup with your primary care provider and neurology specialists for continued evaluation and treatment of your symptoms. Return at any time for changing or worsening symptoms.    Migraine Headache A migraine headache is very bad, throbbing pain on one or both sides of your head. Talk to your doctor about what things may bring on (trigger) your migraine headaches. HOME CARE  Only take medicines as told by your doctor.  Lie down in a dark, quiet room when you have a migraine.  Keep a journal to find out if certain things bring on migraine headaches. For example, write down:  What you eat and drink.  How much sleep you get.  Any change to your diet or medicines.  Lessen how much alcohol you drink.  Quit smoking if you smoke.  Get enough sleep.  Lessen any stress in your life.  Keep lights dim if bright lights bother you or make your migraines worse. GET HELP RIGHT AWAY IF:   Your migraine becomes really bad.  You have a fever.  You have a stiff neck.  You have trouble seeing.  Your muscles are weak, or you lose muscle control.  You lose your balance or have trouble walking.  You feel like you will pass out (faint), or you pass out.  You have really bad symptoms that are different than your first symptoms. MAKE SURE YOU:   Understand these instructions.  Will watch your condition.  Will get help right away if you are not doing well or get worse. Document Released: 04/17/2008 Document Revised: 10/01/2011 Document Reviewed: 03/16/2013 [facility] Patient Information 2014 [place].

## 2013-10-16 NOTE — ED Provider Notes (Signed)
CSN: [id number]     Arrival date & time 10/15/13  2120 History   First MD Initiated Contact with Patient 10/16/13 0007     Chief Complaint  Patient presents with  . Headache   HPI  History provided by the patient. Patient is a 33 year old female with history of menstrual headaches, anemia and vertigo who presents with complaints of persistent headache for the past one week. Patient states that her headache seemed to begin after she bumped her for head on the edge of a wall in her closet. Since that time she's continued to have headache that feels like other headaches she has had in the past but it has not improved with any over-the-counter or home medications. Headache is located around the 4 head and temporal areas. Patient does report some continued dizziness symptoms that she has been having for a long period of time. She was evaluated by a neurologist for this who has scheduled an MRI earlier this month but she states the co-pay was too expensive and she has not been able to followup. No other change in her dizziness symptoms. She has not had any associated fever, chills or sweats. No nausea or vomiting symptoms. No other aggravating or alleviating factors. No other associated symptoms.    Past Medical History  Diagnosis Date  . Anemia   . Vertigo   . Anxiety    History reviewed. No pertinent past surgical history. Family History  Problem Relation Age of Onset  . Seizures    . Heart failure     History  Substance Use Topics  . Smoking status: Never Smoker   . Smokeless tobacco: Never Used  . Alcohol Use: 0.6 oz/week    1 Glasses of wine, 0 Cans of beer, 0 Shots of liquor, 0 Drinks containing 0.5 oz of alcohol per week     Comment: social   OB History   Grav Para Term Preterm Abortions TAB SAB Ect Mult Living   3 3             Review of Systems  Constitutional: Negative for fever, chills and diaphoresis.  Eyes: Positive for photophobia.  Respiratory: Negative for shortness  of breath.   Cardiovascular: Negative for chest pain.  Gastrointestinal: Negative for nausea, vomiting and abdominal pain.  Musculoskeletal: Negative for neck pain.  Neurological: Positive for dizziness and headaches.  Psychiatric/Behavioral: Negative for confusion.  All other systems reviewed and are negative.      Allergies  Review of patient's allergies indicates no known allergies.  Home Medications   Current Outpatient Rx  Name  Route  Sig  Dispense  Refill  . acetaminophen (TYLENOL) 500 MG tablet   Oral   Take 1,000 mg by mouth every 6 (six) hours as needed for pain.         Marland Kitchen ALPRAZolam (XANAX XR) 2 MG 24 hr tablet   Oral   Take 1 tablet by mouth at bedtime.         Marland Kitchen amphetamine-dextroamphetamine (ADDERALL) 10 MG tablet   Oral   Take 10 mg by mouth daily with breakfast.         . busPIRone (BUSPAR) 7.5 MG tablet   Oral   Take 1 tablet by mouth at bedtime.         Marland Kitchen etonogestrel (IMPLANON) 68 MG IMPL implant   Subcutaneous   Inject 1 each into the skin once.         Marland Kitchen LORazepam (ATIVAN) 2 MG tablet  Oral   Take 1 mg by mouth at bedtime.           BP 116/77  Pulse 72  Temp(Src) 98.2 F (36.8 C) (Oral)  Resp 18  Wt 206 lb 2 oz (93.498 kg)  SpO2 97%  LMP 10/03/2013 Physical Exam  Nursing note and vitals reviewed. Constitutional: She is oriented to person, place, and time. She appears well-developed and well-nourished. No distress.  HENT:  Head: Normocephalic and atraumatic.  Eyes: Conjunctivae and EOM are normal. Pupils are equal, round, and reactive to light.  Neck: Normal range of motion. Neck supple.  No meningeal signs  Cardiovascular: Normal rate and regular rhythm.   No murmur heard. Pulmonary/Chest: Effort normal and breath sounds normal. No respiratory distress. She has no wheezes. She has no rales.  Abdominal: Soft. There is no tenderness. There is no rigidity, no rebound, no guarding, no CVA tenderness and no tenderness at  McBurney's point.  Musculoskeletal: Normal range of motion.  Neurological: She is alert and oriented to person, place, and time. She has normal strength. No cranial nerve deficit or sensory deficit. Gait normal.  Skin: Skin is warm and dry. No rash noted.  Psychiatric: She has a normal mood and affect. Her behavior is normal.    ED Course  Procedures   DIAGNOSTIC STUDIES: Oxygen Saturation is 97% on room air.    COORDINATION OF CARE:  Nursing notes reviewed. Vital signs reviewed. Initial pt interview and examination performed.   12:38 AM-patient seen and evaluated. Patient with no acute distress. No focal neurologic findings. Patient has had some ongoing chronic dizziness symptoms. Was scheduled for an MRI earlier this month however has not followed through. No changes in her dizziness symptoms. No other concerning or red flag symptoms for her headache today.   Patient having significant improvement of headache. CT scan unremarkable. At this time she will be discharged home and can followup with her primary care provider and neurologist. She agrees with plan.  Treatment plan initiated: Medications  metoCLOPramide (REGLAN) injection 10 mg (10 mg Intravenous Given 10/16/13 0105)  diphenhydrAMINE (BENADRYL) injection 25 mg (25 mg Intravenous Given 10/16/13 0059)  ketorolac (TORADOL) 30 MG/ML injection 30 mg (30 mg Intravenous Given 10/16/13 0101)  dexamethasone (DECADRON) injection 10 mg (10 mg Intravenous Given 10/16/13 0103)      Imaging Review Ct Head Wo Contrast  10/16/2013   CLINICAL DATA:  Frontal headache and  EXAM: CT HEAD WITHOUT CONTRAST  TECHNIQUE: Contiguous axial images were obtained from the base of the skull through the vertex without intravenous contrast.  COMPARISON:  Prior CT from 03/24/2012  FINDINGS: There is no acute intracranial hemorrhage or infarct. No mass lesion or midline shift. Gray-white matter differentiation is well maintained. Ventricles are normal in size  without evidence of hydrocephalus. CSF containing spaces are within normal limits. No extra-axial fluid collection.  The calvarium is intact.  Orbital soft tissues are within normal limits.  The paranasal sinuses and mastoid air cells are well pneumatized and free of fluid.  Scalp soft tissues are unremarkable.  IMPRESSION: No acute intracranial process.   Electronically Signed   By: Jeannine Boga M.D.   On: 10/16/2013 01:32     MDM   Final diagnoses:  Headache        Martie Lee, PA-C 10/16/13 [phone]

## 2013-10-20 ENCOUNTER — Telehealth: Payer: Self-pay

## 2013-10-20 NOTE — Telephone Encounter (Signed)
Patient wants to cancel her appointment.  Stated she has already established care with another provider.

## 2013-10-21 ENCOUNTER — Ambulatory Visit: Payer: Medicaid Other | Admitting: Family Medicine

## 2013-10-27 ENCOUNTER — Other Ambulatory Visit: Payer: Self-pay

## 2013-10-29 ENCOUNTER — Encounter (HOSPITAL_COMMUNITY): Payer: Self-pay | Admitting: Emergency Medicine

## 2013-10-29 ENCOUNTER — Emergency Department (HOSPITAL_COMMUNITY)
Admission: EM | Admit: 2013-10-29 | Discharge: 2013-10-29 | Disposition: A | Discharge status: Home / Self Care | Payer: PRIVATE HEALTH INSURANCE | Source: Home / Self Care | Attending: Emergency Medicine | Admitting: Emergency Medicine

## 2013-10-29 DIAGNOSIS — F411 Generalized anxiety disorder: Secondary | ICD-10-CM

## 2013-10-29 DIAGNOSIS — F419 Anxiety disorder, unspecified: Secondary | ICD-10-CM

## 2013-10-29 MED ORDER — BUSPIRONE HCL 7.5 MG PO TABS: 7.5000 mg | ORAL_TABLET | Freq: Two times a day (BID) | ORAL | Status: DC

## 2013-10-29 MED ORDER — LORAZEPAM 0.5 MG PO TABS: 0.5000 mg | ORAL_TABLET | Freq: Two times a day (BID) | ORAL | Status: DC

## 2013-10-29 NOTE — ED Provider Notes (Signed)
Medical screening examination/treatment/procedure(s) were performed by non-physician practitioner and as supervising physician I was immediately available for consultation/collaboration.  Philipp Deputy, M.D.  Harden Mo, MD 10/29/13 [phone]

## 2013-10-29 NOTE — ED Provider Notes (Signed)
CSN: [id number]     Arrival date & time 10/29/13  5784 History   First MD Initiated Contact with Patient 10/29/13 0915     Chief Complaint  Patient presents with  . Anxiety  . Medication Refill   (Consider location/radiation/quality/duration/timing/severity/associated sxs/prior Treatment) HPI Comments: Pt takes buspirone 7.5mg  BID. Takes lorazepam 0.5mg  qhs but would like to wean off of it. Is out of meds, unable to get appt with [facility] until late in May, they referred her here.  Sx stable, no change.  Gets panic attacks 2-3 times per week at night. Pt does not want to increase buspirone dose, feels she is stable at this dose.   Patient is a 33 y.o. female presenting with anxiety. The history is provided by the patient.  Anxiety This is a chronic problem. Episode onset: 9 months ago. The problem occurs every several days. Progression since onset: stable. Nothing aggravates the symptoms. Nothing relieves the symptoms. Treatments tried: buspirone and lorazepam.    Past Medical History  Diagnosis Date  . Anemia   . Vertigo   . Anxiety    History reviewed. No pertinent past surgical history. Family History  Problem Relation Age of Onset  . Seizures    . Heart failure     History  Substance Use Topics  . Smoking status: Never Smoker   . Smokeless tobacco: Never Used  . Alcohol Use: 0.6 oz/week    1 Glasses of wine, 0 Cans of beer, 0 Shots of liquor, 0 Drinks containing 0.5 oz of alcohol per week     Comment: social   OB History   Grav Para Term Preterm Abortions TAB SAB Ect Mult Living   3 3             Review of Systems  Constitutional: Negative for activity change and appetite change.  Psychiatric/Behavioral: Positive for sleep disturbance. Negative for suicidal ideas, hallucinations, self-injury and dysphoric mood. The patient is nervous/anxious.     Allergies  Review of patient's allergies indicates no known allergies.  Home Medications    Current Outpatient Rx  Name  Route  Sig  Dispense  Refill  . etonogestrel (IMPLANON) 68 MG IMPL implant   Subcutaneous   Inject 1 each into the skin once.         . busPIRone (BUSPAR) 7.5 MG tablet   Oral   Take 1 tablet (7.5 mg total) by mouth 2 (two) times daily.   60 tablet   1   . HYDROcodone-acetaminophen (NORCO/VICODIN) 5-325 MG per tablet   Oral   Take 1 tablet by mouth every 4 (four) hours as needed for moderate pain.   12 tablet   0   . LORazepam (ATIVAN) 0.5 MG tablet   Oral   Take 1 tablet (0.5 mg total) by mouth 2 (two) times daily.   60 tablet   0   . ondansetron (ZOFRAN ODT) 8 MG disintegrating tablet   Oral   Take 1 tablet (8 mg total) by mouth every 8 (eight) hours as needed for nausea or vomiting.   10 tablet   0    BP 114/79  Pulse 84  Temp(Src) 99.1 F (37.3 C) (Oral)  Resp 16  SpO2 100%  LMP 10/03/2013 Physical Exam  Constitutional: She appears well-developed and well-nourished.  Psychiatric: She has a normal mood and affect. Her speech is normal and behavior is normal. Judgment and thought content normal. Cognition and memory are normal.  ED Course  Procedures (including critical care time) Labs Review Labs Reviewed - No data to display Imaging Review No results found.   MDM   1. Anxiety   refilled buspirone 7.5mg  BID #30 with 1 refill. Rx lorazepam 0.5mg  BID #60. Pt is not to take lorazapam like this, but is going to wean herself off. Discussed 0.5mg  qod, 0.25mg  qod for a couple of weeks. If stable, take 0.25mg  qhs for a couple of weeks. If stable, take 0.25mg  qod for a couple of weeks then stop taking. Pt to f/u with [facility] at end of May as scheduled for further tx/refills.      Carvel Getting, NP 10/29/13 [phone]

## 2013-10-29 NOTE — ED Notes (Signed)
Patient ran out of medication yesterday.  Patient contacted [facility] and unable to be seen before May--told to come to [facility].  Patient reports how she is feeling today is no different than how she has felt for 7-8 months.  Patient specifically requesting buspiron

## 2013-10-29 NOTE — Discharge Instructions (Signed)
Take 0.5mg  lorazepam every other night, and take half (0.25mg ) every other night.  Try this for a couple of weeks. If you are not having increases in symptoms, take 0.25mg  every night for a couple of weeks.  If that goes well, take 0.25mg  every other night.  If that goes well, you can take 0.25mg  twice weekly and then stop completely.    Panic Attacks Panic attacks are sudden, short feelings of great fear or discomfort. You may have them for no reason when you are relaxed, when you are uneasy (anxious), or when you are sleeping.  HOME CARE  Take all your medicines as told.  Check with your doctor before starting new medicines.  Keep all doctor visits. GET HELP IF:  You are not able to take your medicines as told.  Your symptoms do not get better.  Your symptoms get worse. GET HELP RIGHT AWAY IF:  Your attacks seem different than your normal attacks.  You have thoughts about hurting yourself or others.  You take panic attack medicine and you have a side effect. MAKE SURE YOU:  Understand these instructions.  Will watch your condition.  Will get help right away if you are not doing well or get worse. Document Released: 08/11/2010 Document Revised: 04/29/2013 Document Reviewed: 02/20/2013 [facility] Patient Information 2014 [place], [place].

## 2013-11-17 ENCOUNTER — Ambulatory Visit (HOSPITAL_COMMUNITY): Payer: Self-pay | Admitting: Psychiatry

## 2013-12-03 ENCOUNTER — Encounter (HOSPITAL_COMMUNITY): Payer: Self-pay | Admitting: Emergency Medicine

## 2013-12-03 ENCOUNTER — Emergency Department (HOSPITAL_COMMUNITY)
Admission: EM | Admit: 2013-12-03 | Discharge: 2013-12-03 | Disposition: A | Discharge status: Home / Self Care | Payer: PRIVATE HEALTH INSURANCE | Attending: Emergency Medicine | Admitting: Emergency Medicine

## 2013-12-03 DIAGNOSIS — R209 Unspecified disturbances of skin sensation: Secondary | ICD-10-CM | POA: Insufficient documentation

## 2013-12-03 DIAGNOSIS — M25569 Pain in unspecified knee: Secondary | ICD-10-CM | POA: Insufficient documentation

## 2013-12-03 DIAGNOSIS — Z862 Personal history of diseases of the blood and blood-forming organs and certain disorders involving the immune mechanism: Secondary | ICD-10-CM | POA: Insufficient documentation

## 2013-12-03 DIAGNOSIS — F411 Generalized anxiety disorder: Secondary | ICD-10-CM | POA: Insufficient documentation

## 2013-12-03 DIAGNOSIS — M549 Dorsalgia, unspecified: Secondary | ICD-10-CM | POA: Insufficient documentation

## 2013-12-03 DIAGNOSIS — M25561 Pain in right knee: Secondary | ICD-10-CM

## 2013-12-03 MED ORDER — IBUPROFEN 400 MG PO TABS
800.0000 mg | ORAL_TABLET | Freq: Once | ORAL | Status: AC
  Administered 2013-12-03: 800 mg via ORAL
  Filled 2013-12-03: qty 2

## 2013-12-03 NOTE — Discharge Instructions (Signed)
1. Medications: Ibuprofen 800 mg 3 times per day with food, usual home medications 2. Treatment: rest, drink plenty of fluids, gentle stretching as discussed, use brace, ice, elevate  3. Follow Up: Please followup with your primary doctor at your already scheduled appointment in one week for discussion of your diagnoses and further evaluation after today's visit;   Arthralgia Your caregiver has diagnosed you as suffering from an arthralgia. Arthralgia means there is pain in a joint. This can come from many reasons including:  Bruising the joint which causes soreness (inflammation) in the joint.  Wear and tear on the joints which occur as we grow older (osteoarthritis).  Overusing the joint.  Various forms of arthritis.  Infections of the joint. Regardless of the cause of pain in your joint, most of these different pains respond to anti-inflammatory drugs and rest. The exception to this is when a joint is infected, and these cases are treated with antibiotics, if it is a bacterial infection. HOME CARE INSTRUCTIONS   Rest the injured area for as long as directed by your caregiver. Then slowly start using the joint as directed by your caregiver and as the pain allows. Crutches as directed may be useful if the ankles, knees or hips are involved. If the knee was splinted or casted, continue use and care as directed. If an stretchy or elastic wrapping bandage has been applied today, it should be removed and re-applied every 3 to 4 hours. It should not be applied tightly, but firmly enough to keep swelling down. Watch toes and feet for swelling, bluish discoloration, coldness, numbness or excessive pain. If any of these problems (symptoms) occur, remove the ace bandage and re-apply more loosely. If these symptoms persist, contact your caregiver or return to this location.  For the first 24 hours, keep the injured extremity elevated on pillows while lying down.  Apply ice for 15-20 minutes to the sore  joint every couple hours while awake for the first half day. Then 03-04 times per day for the first 48 hours. Put the ice in a plastic bag and place a towel between the bag of ice and your skin.  Wear any splinting, casting, elastic bandage applications, or slings as instructed.  Only take over-the-counter or prescription medicines for pain, discomfort, or fever as directed by your caregiver. Do not use aspirin immediately after the injury unless instructed by your physician. Aspirin can cause increased bleeding and bruising of the tissues.  If you were given crutches, continue to use them as instructed and do not resume weight bearing on the sore joint until instructed. Persistent pain and inability to use the sore joint as directed for more than 2 to 3 days are warning signs indicating that you should see a caregiver for a follow-up visit as soon as possible. Initially, a hairline fracture (break in bone) may not be evident on X-rays. Persistent pain and swelling indicate that further evaluation, non-weight bearing or use of the joint (use of crutches or slings as instructed), or further X-rays are indicated. X-rays may sometimes not show a small fracture until a week or 10 days later. Make a follow-up appointment with your own caregiver or one to whom we have referred you. A radiologist (specialist in reading X-rays) may read your X-rays. Make sure you know how you are to obtain your X-ray results. Do not assume everything is normal if you do not hear from [place]. SEEK MEDICAL CARE IF: Bruising, swelling, or pain increases. [place]  IF:   Your fingers or toes are numb or blue.  The pain is not responding to medications and continues to stay the same or get worse.  The pain in your joint becomes severe.  You develop a fever over 102 F (38.9 C).  It becomes impossible to move or use the joint. MAKE SURE YOU:   Understand these instructions.  Will watch your  condition.  Will get help right away if you are not doing well or get worse. Document Released: 07/09/2005 Document Revised: 10/01/2011 Document Reviewed: 02/25/2008 [facility] Patient Information 2014 [place].

## 2013-12-03 NOTE — ED Provider Notes (Signed)
CSN: [id number]     Arrival date & time 12/03/13  1744 History   This chart was scribed for non-physician practitioner Abigail Butts, PA-C, working with Babette Relic, MD, by Neta Ehlers, ED Scribe. This patient was seen in room TR08C/TR08C and the patient's care was started at 7:24 PM.  First MD Initiated Contact with Patient 12/03/13 1859     Chief Complaint  Patient presents with  . Leg Pain     The history is provided by the patient. No language interpreter was used.   HPI Comments: Breanna Thomas is a 33 y.o. female, with a ho anxiety, who presents to the Emergency Department complaining of pain which "shoots" from her right lower leg anterioraly towards her knee and which has been occurring intermittently for six months. She states the pain became most noticeable to her right knee two days ago. She states the pain occurs two to three times a week, and that the pain lasts for several minutes. She characterizes the pain as "throbbing" and "shooting." Episodes last for only several minutes at a time and then resolve completely and spontaneously. The pt reports that when she is recumbent, the pain is most noticeable. Nothing seems to improve the pain. She also endorses "stiffness" to her back, but no back pain. She denies recent injuries or falls. She also denies exercising on a regular basis. The pt states the bruises present on her leg "come out of nowhere." She has not used any medication for the symptoms.   Past Medical History  Diagnosis Date  . Anemia   . Vertigo   . Anxiety    History reviewed. No pertinent past surgical history. Family History  Problem Relation Age of Onset  . Seizures    . Heart failure     History  Substance Use Topics  . Smoking status: Never Smoker   . Smokeless tobacco: Never Used  . Alcohol Use: 0.6 oz/week    1 Glasses of wine, 0 Cans of beer, 0 Shots of liquor, 0 Drinks containing 0.5 oz of alcohol per week     Comment: social   OB  History   Grav Para Term Preterm Abortions TAB SAB Ect Mult Living   3 3             Review of Systems  Musculoskeletal: Positive for arthralgias, back pain and myalgias.  Neurological: Positive for numbness.  All other systems reviewed and are negative.   Allergies  Review of patient's allergies indicates no known allergies.  Home Medications   Prior to Admission medications   Medication Sig Start Date End Date Taking? Authorizing Provider  busPIRone (BUSPAR) 7.5 MG tablet Take 7.5 mg by mouth at bedtime.   Yes Historical Provider, MD  etonogestrel (IMPLANON) 68 MG IMPL implant Inject 1 each into the skin once.   Yes Historical Provider, MD   Triage Vitals: BP 125/74  Pulse 87  Temp(Src) 98.5 F (36.9 C) (Oral)  Resp 18  SpO2 98%  Physical Exam  Nursing note and vitals reviewed. Constitutional: She appears well-developed and well-nourished. No distress.  Awake, alert, nontoxic appearance  HENT:  Head: Normocephalic and atraumatic.  Mouth/Throat: Oropharynx is clear and moist. No oropharyngeal exudate.  Eyes: Conjunctivae are normal. No scleral icterus.  Neck: Normal range of motion. Neck supple.  Full ROM without pain  Cardiovascular: Normal rate, regular rhythm, normal heart sounds and intact distal pulses.   No murmur heard. Capillary refill is <3 seconds  Pulmonary/Chest: Effort  normal and breath sounds normal. No respiratory distress. She has no wheezes.  Abdominal: Soft. Bowel sounds are normal. She exhibits no distension and no mass. There is no tenderness. There is no rebound and no guarding.  Musculoskeletal: Normal range of motion. She exhibits tenderness. She exhibits no edema.       Right knee: Normal. No tenderness found.  Full range of motion of the T-spine and L-spine No tenderness to palpation of the spinous processes of the T-spine or L-spine No tenderness to palpation of the paraspinous muscles of the L-spine Full range of motion of bilateral hips,  knees, ankles and toes  No swelling or edema to the right leg, negative Homans sign  No pain to palpation of the calf, no palpable cord  Pain to palpation of the right knee, no joint line tenderness, no abnormal patellar movement  Lymphadenopathy:    She has no cervical adenopathy.  Neurological: She is alert. She has normal reflexes. Coordination normal.  Speech is clear and goal oriented, follows commands Normal 5/5 strength in upper and lower extremities bilaterally including dorsiflexion and plantar flexion, strong and equal grip strength Sensation normal to light and sharp touch Moves extremities without ataxia, coordination intact Normal gait Normal balance  Skin: Skin is warm and dry. No rash noted. She is not diaphoretic. No erythema.  No tenting of the skin  Psychiatric: She has a normal mood and affect. Her behavior is normal.    ED Course  Procedures (including critical care time)  DIAGNOSTIC STUDIES: Oxygen Saturation is 98% on room air, normal by my interpretation.    COORDINATION OF CARE:  7:31 PM- Discussed treatment plan with patient, and the patient agreed to the plan.  The plan includes usage of IBU and a brace.   Labs Review Labs Reviewed - No data to display  Imaging Review No results found.   EKG Interpretation None      MDM   Final diagnoses:  Arthralgia of right knee  Arthralgia of right lower leg   Breanna Thomas presents with intermittent, infrequent and spontaneously resolving anterior right lower leg pain. Patient now endorsing right knee pain for 2 days also intermittent and spontaneously resolving. The physical exam no abnormality noted. Specifically no edema or pain to palpation of the calf to suggest a DVT. No knee effusion. No erythema or induration of the skin to suggest septic joint. Patient with full range of motion, normal sensation and neurologically intact in the lower extremities.  Recommend anti-inflammatories for possible  knee arthritis or malacia patellar. Patient without injury and therefore does not need x-ray.  Patient will see her primary care physician within one week for further evaluation.  She is to return to the emergency department for worsening symptoms, shortness of breath or other concerning complaints.  It has been determined that no acute conditions requiring further emergency intervention are present at this time. The patient/guardian have been advised of the diagnosis and plan. We have discussed signs and symptoms that warrant return to the ED, such as changes or worsening in symptoms.   Vital signs are stable at discharge.   BP 125/74  Pulse 87  Temp(Src) 98.5 F (36.9 C) (Oral)  Resp 18  SpO2 98%  Patient/guardian has voiced understanding and agreed to follow-up with the PCP or specialist.  I personally performed the services described in this documentation, which was scribed in my presence. The recorded information has been reviewed and is accurate.      Jarrett Soho  [place], PA-C 12/03/13 1944

## 2013-12-03 NOTE — ED Notes (Signed)
She states shes had "shooting pains from my right foot to my knee for over 6 months." describes as intermittent. She denies any injuries to her leg.

## 2013-12-04 NOTE — ED Provider Notes (Signed)
Medical screening examination/treatment/procedure(s) were performed by non-physician practitioner and as supervising physician I was immediately available for consultation/collaboration.   EKG Interpretation None       Babette Relic, MD 12/04/13 2146

## 2013-12-10 ENCOUNTER — Emergency Department (HOSPITAL_COMMUNITY)
Admission: EM | Admit: 2013-12-10 | Discharge: 2013-12-10 | Disposition: A | Discharge status: Home / Self Care | Payer: Medicaid Other | Attending: Emergency Medicine | Admitting: Emergency Medicine

## 2013-12-10 ENCOUNTER — Encounter (HOSPITAL_COMMUNITY): Payer: Self-pay | Admitting: Emergency Medicine

## 2013-12-10 ENCOUNTER — Emergency Department (HOSPITAL_COMMUNITY): Payer: Medicaid Other

## 2013-12-10 DIAGNOSIS — Z79899 Other long term (current) drug therapy: Secondary | ICD-10-CM | POA: Insufficient documentation

## 2013-12-10 DIAGNOSIS — F411 Generalized anxiety disorder: Secondary | ICD-10-CM | POA: Insufficient documentation

## 2013-12-10 DIAGNOSIS — Z862 Personal history of diseases of the blood and blood-forming organs and certain disorders involving the immune mechanism: Secondary | ICD-10-CM | POA: Insufficient documentation

## 2013-12-10 DIAGNOSIS — R202 Paresthesia of skin: Secondary | ICD-10-CM

## 2013-12-10 DIAGNOSIS — R209 Unspecified disturbances of skin sensation: Secondary | ICD-10-CM | POA: Insufficient documentation

## 2013-12-10 DIAGNOSIS — M79609 Pain in unspecified limb: Secondary | ICD-10-CM | POA: Insufficient documentation

## 2013-12-10 NOTE — ED Notes (Signed)
Pt reports decreased sensation in neck/L upper arm since this am. Pt reports issues with cervical spine pain for several months. Also reports R toe "numbness" for past 3 months. Full ROM.

## 2013-12-10 NOTE — Discharge Instructions (Signed)
Paresthesia °Paresthesia is a burning or prickling feeling. This feeling can happen in any part of the body. It often happens in the hands, arms, legs, or feet. °HOME CARE °· Avoid drinking alcohol. °· Try massage or needle therapy (acupuncture) to help with your problems. °· Keep all doctor visits as told. °GET HELP RIGHT AWAY IF:  °· You feel weak. °· You have trouble walking or moving. °· You have problems speaking or seeing. °· You feel confused. °· You cannot control when you poop (bowel movement) or pee (urinate). °· You lose feeling (numbness) after an injury. °· You pass out (faint). °· Your burning or prickling feeling gets worse when you walk. °· You have pain, cramps, or feel dizzy. °· You have a rash. °MAKE SURE YOU:  °· Understand these instructions. °· Will watch your condition. °· Will get help right away if you are not doing well or get worse. °Document Released: 06/21/2008 Document Revised: 10/01/2011 Document Reviewed: 03/30/2011 °[employer]® Patient Information ©2014 [employer]. ° °

## 2013-12-10 NOTE — ED Provider Notes (Signed)
CSN: [id number]     Arrival date & time 12/10/13  0827 History   First MD Initiated Contact with Patient 12/10/13 [phone]     Chief Complaint  Patient presents with  . Neck Pain  . Toe Pain     (Consider location/radiation/quality/duration/timing/severity/associated sxs/prior Treatment) Patient is a 33 y.o. female presenting with neck pain and toe pain.  Neck Pain Toe Pain   Patient complaining of right side shoulder numbness that began an unknown time ago.  It is constant.  She denies neck injury or pain.  She can not name any increasing or decreasing factors.  She has no weakness, speech problem or visual changes.   She states she was seen two days ago for right knee pain in the [place] and no x-rays were obtained.  She had some right great toe numbness.  Past Medical History  Diagnosis Date  . Anemia   . Vertigo   . Anxiety    History reviewed. No pertinent past surgical history. Family History  Problem Relation Age of Onset  . Seizures    . Heart failure     History  Substance Use Topics  . Smoking status: Never Smoker   . Smokeless tobacco: Never Used  . Alcohol Use: 0.6 oz/week    1 Glasses of wine, 0 Cans of beer, 0 Shots of liquor, 0 Drinks containing 0.5 oz of alcohol per week     Comment: social   OB History   Grav Para Term Preterm Abortions TAB SAB Ect Mult Living   3 3             Review of Systems  Musculoskeletal: Positive for neck pain.  All other systems reviewed and are negative.     Allergies  Review of patient's allergies indicates no known allergies.  Home Medications   Prior to Admission medications   Medication Sig Start Date End Date Taking? Authorizing Provider  busPIRone (BUSPAR) 15 MG tablet Take 7.5 mg by mouth daily.   Yes Historical Provider, MD  etonogestrel (IMPLANON) 68 MG IMPL implant Inject 1 each into the skin once.   Yes Historical Provider, MD   BP 118/81  Pulse 83  Temp(Src) 98.4 F (36.9 C) (Oral)  Resp 20  SpO2  100%  LMP 12/04/1942 Physical Exam  Nursing note and vitals reviewed. Constitutional: She is oriented to person, place, and time. She appears well-developed and well-nourished.  obese  HENT:  Head: Normocephalic and atraumatic.  Right Ear: Tympanic membrane and external ear normal.  Left Ear: Tympanic membrane and external ear normal.  Nose: Nose normal. Right sinus exhibits no maxillary sinus tenderness and no frontal sinus tenderness. Left sinus exhibits no maxillary sinus tenderness and no frontal sinus tenderness.  Mouth/Throat: Oropharynx is clear and moist.  Eyes: Conjunctivae and EOM are normal. Pupils are equal, round, and reactive to light. Right eye exhibits no nystagmus. Left eye exhibits no nystagmus.  Neck: Normal range of motion. Neck supple.  Cardiovascular: Normal rate, regular rhythm, normal heart sounds and intact distal pulses.   Pulmonary/Chest: Effort normal and breath sounds normal. No respiratory distress. She exhibits no tenderness.  Abdominal: Soft. Bowel sounds are normal. She exhibits no distension and no mass. There is no tenderness.  Musculoskeletal: Normal range of motion. She exhibits no edema and no tenderness.  Neurological: She is alert and oriented to person, place, and time. She has normal strength and normal reflexes. No sensory deficit. She displays a negative Romberg sign. GCS eye  subscore is 4. GCS verbal subscore is 5. GCS motor subscore is 6.  Reflex Scores:      Tricep reflexes are 2+ on the right side and 2+ on the left side.      Bicep reflexes are 2+ on the right side and 2+ on the left side.      Brachioradialis reflexes are 2+ on the right side and 2+ on the left side.      Patellar reflexes are 2+ on the right side and 2+ on the left side.      Achilles reflexes are 2+ on the right side and 2+ on the left side. Patient with normal gait without ataxia, shuffling, spasm, or antalgia. Speech is normal without dysarthria, dysphasia, or  aphasia. Muscle strength is 5/5 in bilateral shoulders, elbow flexor and extensors, wrist flexor and extensors, and intrinsic hand muscles. 5/5 bilateral lower extremity hip flexors, extensors, knee flexors and extensors, and ankle dorsi and plantar flexors. Sensation intact bilaterally to sharp and soft stimuli    Skin: Skin is warm and dry. No rash noted.  Psychiatric: She has a normal mood and affect. Her behavior is normal. Judgment and thought content normal.    ED Course  Procedures (including critical care time) Labs Review Labs Reviewed - No data to display  Imaging Review Dg Cervical Spine Complete  12/10/2013   CLINICAL DATA:  Neck pain for 3-4 weeks.  Right arm numbness.  EXAM: CERVICAL SPINE  4+ VIEWS  COMPARISON:  None.  FINDINGS: There is no evidence of cervical spine fracture or prevertebral soft tissue swelling. Alignment is normal. No other significant bone abnormalities are identified.  IMPRESSION: Negative cervical spine radiographs.   Electronically Signed   By: Rolla Flatten M.D.   On: 12/10/2013 09:54     EKG Interpretation None      MDM   Final diagnoses:  Paresthesia   Patient with normal neurological exam and complaints of paresthesias right shoulder area. Plain cervical spine x-rays obtained do not show any abnormalities. She has no complaints of pain of the head neck, thoracic, or lumbar spine. Given her normal neurological exam with the normal sensory component, I have low index of suspicion for cord or head abnormalities. I discussed return precautions and need for followup the patient and she wishes understanding.    Shaune Pollack, MD 12/10/13 1014

## 2013-12-15 ENCOUNTER — Encounter: Payer: Self-pay | Admitting: Internal Medicine

## 2013-12-15 ENCOUNTER — Ambulatory Visit: Payer: PRIVATE HEALTH INSURANCE | Attending: Internal Medicine | Admitting: Internal Medicine

## 2013-12-15 VITALS — BP 111/79 | HR 83 | Temp 98.1°F | Resp 16 | Wt 206.6 lb

## 2013-12-15 DIAGNOSIS — M5380 Other specified dorsopathies, site unspecified: Secondary | ICD-10-CM

## 2013-12-15 DIAGNOSIS — F419 Anxiety disorder, unspecified: Secondary | ICD-10-CM

## 2013-12-15 DIAGNOSIS — Z130 Encounter for screening for diseases of the blood and blood-forming organs and certain disorders involving the immune mechanism: Secondary | ICD-10-CM | POA: Insufficient documentation

## 2013-12-15 DIAGNOSIS — Z79899 Other long term (current) drug therapy: Secondary | ICD-10-CM | POA: Insufficient documentation

## 2013-12-15 DIAGNOSIS — M62830 Muscle spasm of back: Secondary | ICD-10-CM

## 2013-12-15 DIAGNOSIS — F411 Generalized anxiety disorder: Secondary | ICD-10-CM | POA: Insufficient documentation

## 2013-12-15 DIAGNOSIS — Z139 Encounter for screening, unspecified: Secondary | ICD-10-CM

## 2013-12-15 LAB — CBC WITH DIFFERENTIAL/PLATELET
Basophils Absolute: 0 10*3/uL (ref 0.0–0.1)
Basophils Relative: 0 % (ref 0–1)
Eosinophils Absolute: 0.1 10*3/uL (ref 0.0–0.7)
Eosinophils Relative: 1 % (ref 0–5)
HEMATOCRIT: 34.6 % — AB (ref 36.0–46.0)
Hemoglobin: 11.4 g/dL — ABNORMAL LOW (ref 12.0–15.0)
LYMPHS PCT: 38 % (ref 12–46)
Lymphs Abs: 2.5 10*3/uL (ref 0.7–4.0)
MCH: 28.7 pg (ref 26.0–34.0)
MCHC: 32.9 g/dL (ref 30.0–36.0)
MCV: 87.2 fL (ref 78.0–100.0)
Monocytes Absolute: 0.5 10*3/uL (ref 0.1–1.0)
Monocytes Relative: 7 % (ref 3–12)
NEUTROS PCT: 54 % (ref 43–77)
Neutro Abs: 3.5 10*3/uL (ref 1.7–7.7)
Platelets: 275 10*3/uL (ref 150–400)
RBC: 3.97 MIL/uL (ref 3.87–5.11)
RDW: 12.2 % (ref 11.5–15.5)
WBC: 6.5 10*3/uL (ref 4.0–10.5)

## 2013-12-15 LAB — COMPLETE METABOLIC PANEL WITH GFR
ALT: 11 U/L (ref 0–35)
AST: 20 U/L (ref 0–37)
Albumin: 3.9 g/dL (ref 3.5–5.2)
Alkaline Phosphatase: 42 U/L (ref 39–117)
BILIRUBIN TOTAL: 0.4 mg/dL (ref 0.2–1.2)
BUN: 10 mg/dL (ref 6–23)
CALCIUM: 9 mg/dL (ref 8.4–10.5)
CO2: 27 meq/L (ref 19–32)
CREATININE: 0.68 mg/dL (ref 0.50–1.10)
Chloride: 107 mEq/L (ref 96–112)
GLUCOSE: 111 mg/dL — AB (ref 70–99)
Potassium: 3.8 mEq/L (ref 3.5–5.3)
Sodium: 137 mEq/L (ref 135–145)
Total Protein: 6.6 g/dL (ref 6.0–8.3)

## 2013-12-15 MED ORDER — CYCLOBENZAPRINE HCL 10 MG PO TABS: 10.0000 mg | ORAL_TABLET | Freq: Every day | ORAL | Status: DC

## 2013-12-15 NOTE — Progress Notes (Signed)
Patient here to establish care Takes buspar and ativan as needed

## 2013-12-15 NOTE — Progress Notes (Signed)
Patient Demographics  Breanna Thomas, is a 33 y.o. female  VOH:[id number]  IRS:[id number]  DOB - [birth year]  CC:  Chief Complaint  Patient presents with  . Establish Care       HPI: Breanna Thomas is a 33 y.o. female here today to establish medical care. Patient has history of anxiety and is currently taking BuSpar and Ativan when necessary as per patient she saw a psychiatrist 6 right months ago, she denies any SI or HI, and is requesting referral to see a counselor, patient also reported to have some pain in upper back denies any fall or trauma had been to ER a few times for the pain including right knee pain which she doesn't have now, she denies any fever chills. Patient has No headache, No chest pain, No abdominal pain - No Nausea, No new weakness tingling or numbness, No Cough - SOB.  No Known Allergies Past Medical History  Diagnosis Date  . Anemia   . Vertigo   . Anxiety    Current Outpatient Prescriptions on File Prior to Visit  Medication Sig Dispense Refill  . etonogestrel (IMPLANON) 68 MG IMPL implant Inject 1 each into the skin once.      . busPIRone (BUSPAR) 15 MG tablet Take 7.5 mg by mouth 2 (two) times daily.        No current facility-administered medications on file prior to visit.   Family History  Problem Relation Age of Onset  . Seizures    . Heart failure    . Diabetes Mother   . Hypertension Mother   . Heart disease Father   . Seizures Father   . Diabetes Sister    History   Social History  . Marital Status: Single    Spouse Name: N/A    Number of Children: 3  . Years of Education: College   Occupational History  .      [place] History Main Topics  . Smoking status: Never Smoker   . Smokeless tobacco: Never Used  . Alcohol Use: 0.6 oz/week    1 Glasses of wine, 0 Cans of beer, 0 Shots of liquor, 0 Drinks containing 0.5 oz of alcohol per week     Comment: social  . Drug Use: No  . Sexual Activity: Not  Currently    Birth Control/ Protection: Implant   Other Topics Concern  . Not on file   Social History Narrative   Patient lives at home alone.   Caffeine Use: none in the past 2 weeks, soda occasionally    Review of Systems: Constitutional: Negative for fever, chills, diaphoresis, activity change, appetite change and fatigue. HENT: Negative for ear pain, nosebleeds, congestion, facial swelling, rhinorrhea, neck pain, neck stiffness and ear discharge.  Eyes: Negative for pain, discharge, redness, itching and visual disturbance. Respiratory: Negative for cough, choking, chest tightness, shortness of breath, wheezing and stridor.  Cardiovascular: Negative for chest pain, palpitations and leg swelling. Gastrointestinal: Negative for abdominal distention. Genitourinary: Negative for dysuria, urgency, frequency, hematuria, flank pain, decreased urine volume, difficulty urinating and dyspareunia.  Musculoskeletal: Negative for back pain, joint swelling, arthralgia and gait problem. Neurological: Negative for dizziness, tremors, seizures, syncope, facial asymmetry, speech difficulty, weakness, light-headedness, numbness and headaches.  Hematological: Negative for adenopathy. Does not bruise/bleed easily. Psychiatric/Behavioral: Negative for hallucinations, behavioral problems, confusion, dysphoric mood, decreased concentration and agitation.    Objective:   Filed Vitals:   12/15/13 1214  BP: 111/79  Pulse: 83  Temp: 98.1 F (36.7 C)  Resp: 16    Physical Exam: Constitutional: Patient appears well-developed and well-nourished. No distress. HENT: Normocephalic, atraumatic, External right and left ear normal. Oropharynx is clear and moist.  Eyes: Conjunctivae and EOM are normal. PERRLA, no scleral icterus. Neck: Normal ROM. Neck supple. No JVD. No tracheal deviation. No thyromegaly. CVS: RRR, S1/S2 +, no murmurs, no gallops, no carotid bruit.  Pulmonary: Effort and breath sounds  normal, no stridor, rhonchi, wheezes, rales.  Abdominal: Soft. BS +, no distension, tenderness, rebound or guarding.  Musculoskeletal: Normal range of motion. No edema and no tenderness.  Lymphadenopathy: No lymphadenopathy noted, cervical, inguinal or axillar Neuro: Alert. Normal reflexes, muscle tone coordination. No cranial nerve deficit. Skin: Skin is warm and dry. No rash noted. Not diaphoretic. No erythema. No pallor. Psychiatric: Normal mood and affect. Behavior, judgment, thought content normal.  Lab Results  Component Value Date   WBC 7.7 04/06/2013   HGB 11.2* 04/06/2013   HCT 35.1* 04/06/2013   MCV 87.1 04/06/2013   PLT 202 04/06/2013   Lab Results  Component Value Date   CREATININE 0.66 04/06/2013   BUN 6 04/06/2013   NA 136 04/06/2013   K 4.4 04/06/2013   CL 102 04/06/2013   CO2 25 04/06/2013    No results found for this basename: HGBA1C   Lipid Panel  No results found for this basename: chol, trig, hdl, cholhdl, vldl, ldlcalc       Assessment and plan:   1. Anxiety Continue with BuSpar, Ativan when necessary, she'll be given a followup with [facility].  2. Back muscle spasm Advised patient to apply heating pad, Flexeril each bedtime. - cyclobenzaprine (FLEXERIL) 10 MG tablet; Take 1 tablet (10 mg total) by mouth at bedtime.  Dispense: 30 tablet; Refill: 1  3. Screening Baseline blood work - COMPLETE METABOLIC PANEL WITH GFR - Vit D  25 hydroxy (rtn osteoporosis monitoring) - CBC with Differential        Health Maintenance  -Pap Smear: Following up with the GYN   Return in about 3 months (around 03/17/2014) for anxiety, [facility] officer, [facility] Visit.     Breanna Marek, MD

## 2013-12-16 ENCOUNTER — Ambulatory Visit: Payer: PRIVATE HEALTH INSURANCE | Attending: Internal Medicine | Admitting: *Deleted

## 2013-12-16 ENCOUNTER — Telehealth: Payer: Self-pay

## 2013-12-16 DIAGNOSIS — Z599 Problem related to housing and economic circumstances, unspecified: Secondary | ICD-10-CM

## 2013-12-16 LAB — VITAMIN D 25 HYDROXY (VIT D DEFICIENCY, FRACTURES): VIT D 25 HYDROXY: 24 ng/mL — AB (ref 30–89)

## 2013-12-16 MED ORDER — VITAMIN D (ERGOCALCIFEROL) 1.25 MG (50000 UNIT) PO CAPS: 50000.0000 [IU] | ORAL_CAPSULE | ORAL | Status: DC

## 2013-12-16 NOTE — Progress Notes (Signed)
Patient needed mental health resources.  LCSW provided patient with walk in information for [facility].  Christene Lye MSW, LCSW

## 2013-12-16 NOTE — Telephone Encounter (Signed)
Patient is aware of her lab results and [facility]

## 2013-12-16 NOTE — Telephone Encounter (Signed)
Message copied by Dorothe Pea on Wed Dec 16, 2013  2:41 PM ------      Message from: Lorayne Marek      Created: Wed Dec 16, 2013 10:10 AM       Blood work reviewed, noticed low vitamin D, call patient advise to start ergocalciferol 50,000 units once a week for the duration of  12 weeks.       noticed impaired fasting glucose, call and advise patient for low carbohydrate diet.       ------

## 2013-12-28 ENCOUNTER — Telehealth: Payer: Self-pay | Admitting: Internal Medicine

## 2013-12-28 NOTE — Telephone Encounter (Signed)
Pt calling because after 12/15/13 visit was not told what to do regarding right leg pain/swelling. Please f/u with pt.

## 2013-12-30 ENCOUNTER — Telehealth: Payer: Self-pay

## 2013-12-30 ENCOUNTER — Encounter: Payer: Self-pay | Admitting: Internal Medicine

## 2013-12-30 ENCOUNTER — Ambulatory Visit: Payer: PRIVATE HEALTH INSURANCE | Attending: Internal Medicine | Admitting: Internal Medicine

## 2013-12-30 VITALS — BP 119/81 | HR 74 | Temp 98.4°F | Resp 17 | Wt 208.4 lb

## 2013-12-30 DIAGNOSIS — R42 Dizziness and giddiness: Secondary | ICD-10-CM | POA: Insufficient documentation

## 2013-12-30 DIAGNOSIS — R7301 Impaired fasting glucose: Secondary | ICD-10-CM | POA: Insufficient documentation

## 2013-12-30 DIAGNOSIS — Z79899 Other long term (current) drug therapy: Secondary | ICD-10-CM | POA: Insufficient documentation

## 2013-12-30 DIAGNOSIS — M25561 Pain in right knee: Secondary | ICD-10-CM | POA: Insufficient documentation

## 2013-12-30 DIAGNOSIS — M25569 Pain in unspecified knee: Secondary | ICD-10-CM

## 2013-12-30 DIAGNOSIS — E559 Vitamin D deficiency, unspecified: Secondary | ICD-10-CM | POA: Insufficient documentation

## 2013-12-30 DIAGNOSIS — D649 Anemia, unspecified: Secondary | ICD-10-CM | POA: Insufficient documentation

## 2013-12-30 DIAGNOSIS — F411 Generalized anxiety disorder: Secondary | ICD-10-CM | POA: Insufficient documentation

## 2013-12-30 DIAGNOSIS — F419 Anxiety disorder, unspecified: Secondary | ICD-10-CM

## 2013-12-30 MED ORDER — BUSPIRONE HCL 15 MG PO TABS: 15.0000 mg | ORAL_TABLET | Freq: Two times a day (BID) | ORAL | Status: DC

## 2013-12-30 NOTE — Progress Notes (Signed)
Patient here with her mom States was at the gym this am and felt like she was going to pass out Was having some SOB, heart was racinp Mom had called paramedics and they advised her to follow up with her primary office

## 2013-12-30 NOTE — Telephone Encounter (Signed)
Returned patient call  Patient not available Left message on voice mail to return our call 

## 2013-12-30 NOTE — Progress Notes (Signed)
MRN: [id number] Name: Breanna Thomas  Sex: female Age: 33 y.o. DOB: 07/12/1981  Allergies: Review of patient's allergies indicates no known allergies.  Chief Complaint  Patient presents with  . Panic Attack    HPI: Patient is 33 y.o. female who has history of generalized anxiety disorder/panic attack as per patient she was taking BuSpar 7.5 mg twice a day for the last one month she reduced down to 7.5 mg daily, also last few days she has been having lot of panic attacks as per patient she also tried it in the past didn't help much, currently denies any chest shortness of breath her blood pressure is normal and pulses normal, previous blood work reviewed with the patient her TSH level was normal noted vitamin D deficiency and impaired fasting glucose. Patient reported to have right knee pain on and off, denies any fall or trauma.  Past Medical History  Diagnosis Date  . Anemia   . Vertigo   . Anxiety     History reviewed. No pertinent past surgical history.    Medication List       This list is accurate as of: 12/30/13 11:35 AM.  Always use your most recent med list.               busPIRone 15 MG tablet  Commonly known as:  BUSPAR  Take 7.5 mg by mouth 2 (two) times daily.     busPIRone 15 MG tablet  Commonly known as:  BUSPAR  Take 1 tablet (15 mg total) by mouth 2 (two) times daily.     cyclobenzaprine 10 MG tablet  Commonly known as:  FLEXERIL  Take 1 tablet (10 mg total) by mouth at bedtime.     IMPLANON 68 MG Impl implant  Generic drug:  etonogestrel  Inject 1 each into the skin once.     LORazepam 1 MG tablet  Commonly known as:  ATIVAN  Take 1 mg by mouth every 8 (eight) hours as needed for anxiety.     Vitamin D (Ergocalciferol) 50000 UNITS Caps capsule  Commonly known as:  DRISDOL  Take 1 capsule (50,000 Units total) by mouth every 7 (seven) days.        Meds ordered this encounter  Medications  . busPIRone (BUSPAR) 15 MG tablet    Sig:  Take 1 tablet (15 mg total) by mouth 2 (two) times daily.    Dispense:  60 tablet    Refill:  3     There is no immunization history on file for this patient.  Family History  Problem Relation Age of Onset  . Seizures    . Heart failure    . Diabetes Mother   . Hypertension Mother   . Heart disease Father   . Seizures Father   . Diabetes Sister     History  Substance Use Topics  . Smoking status: Never Smoker   . Smokeless tobacco: Never Used  . Alcohol Use: 0.6 oz/week    1 Glasses of wine, 0 Cans of beer, 0 Shots of liquor, 0 Drinks containing 0.5 oz of alcohol per week     Comment: social    Review of Systems   As noted in HPI  Filed Vitals:   12/30/13 1120  BP: 119/81  Pulse: 74  Temp: 98.4 F (36.9 C)  Resp: 17    Physical Exam  Physical Exam  Constitutional: No distress.  Eyes: EOM are normal. Pupils are equal, round, and reactive  to light.  Cardiovascular: Normal rate and regular rhythm.   Pulmonary/Chest: Breath sounds normal. No respiratory distress. She has no wheezes. She has no rales.  Musculoskeletal:  Right knee Good ROM, minimal tenderness medially     CBC    Component Value Date/Time   WBC 6.5 12/15/2013 1239   RBC 3.97 12/15/2013 1239   HGB 11.4* 12/15/2013 1239   HCT 34.6* 12/15/2013 1239   PLT 275 12/15/2013 1239   MCV 87.2 12/15/2013 1239   LYMPHSABS 2.5 12/15/2013 1239   MONOABS 0.5 12/15/2013 1239   EOSABS 0.1 12/15/2013 1239   BASOSABS 0.0 12/15/2013 1239    CMP     Component Value Date/Time   NA 137 12/15/2013 1239   K 3.8 12/15/2013 1239   CL 107 12/15/2013 1239   CO2 27 12/15/2013 1239   GLUCOSE 111* 12/15/2013 1239   BUN 10 12/15/2013 1239   CREATININE 0.68 12/15/2013 1239   CREATININE 0.66 04/06/2013 0925   CALCIUM 9.0 12/15/2013 1239   PROT 6.6 12/15/2013 1239   ALBUMIN 3.9 12/15/2013 1239   AST 20 12/15/2013 1239   ALT 11 12/15/2013 1239   ALKPHOS 42 12/15/2013 1239   BILITOT 0.4 12/15/2013 1239   GFRNONAA >89 12/15/2013 1239     GFRNONAA >90 04/06/2013 0925   GFRAA >89 12/15/2013 1239   GFRAA >90 04/06/2013 0925    No results found for this basename: chol, tri, ldl    No components found with this basename: hga1c    Lab Results  Component Value Date/Time   AST 20 12/15/2013 12:39 PM    Assessment and Plan  Anxiety - Plan: I have increased the dose of BuSpar, she will take 50 mg at night and 7.5 mg in the morning for a few weeks and if needed she will increase it to 15mg  twice a day busPIRone (BUSPAR) 15 MG tablet, she can take lorazepam when necessary  Right knee pain - Plan: DG Knee Complete 4 Views Right  IFG (impaired fasting glucose) Advised patient for low carbohydrate diet.  Unspecified vitamin D deficiency Patient has started taking vitamin D supplement.   Return in about 3 months (around 04/01/2014) for IFG, anxiety.  Lorayne Marek, MD

## 2013-12-30 NOTE — Patient Instructions (Signed)

## 2013-12-31 ENCOUNTER — Ambulatory Visit (HOSPITAL_COMMUNITY)
Admission: RE | Admit: 2013-12-31 | Discharge: 2013-12-31 | Disposition: A | Discharge status: Home / Self Care | Payer: PRIVATE HEALTH INSURANCE | Source: Ambulatory Visit | Attending: Internal Medicine | Admitting: Internal Medicine

## 2013-12-31 DIAGNOSIS — M25569 Pain in unspecified knee: Secondary | ICD-10-CM | POA: Insufficient documentation

## 2013-12-31 DIAGNOSIS — M25469 Effusion, unspecified knee: Secondary | ICD-10-CM | POA: Insufficient documentation

## 2013-12-31 DIAGNOSIS — M25561 Pain in right knee: Secondary | ICD-10-CM

## 2014-01-01 ENCOUNTER — Telehealth: Payer: Self-pay

## 2014-01-01 DIAGNOSIS — M25569 Pain in unspecified knee: Secondary | ICD-10-CM

## 2014-01-01 NOTE — Telephone Encounter (Signed)
Message copied by Dorothe Pea on Fri Jan 01, 2014 10:05 AM ------      Message from: Lorayne Marek      Created: Fri Jan 01, 2014  9:37 AM       Call and let the patient know that her knee x-rays is normal. ------

## 2014-01-01 NOTE — Telephone Encounter (Signed)
Patient is aware of her x ray results Patient is requesting referral to ortho

## 2014-01-07 ENCOUNTER — Encounter (HOSPITAL_COMMUNITY): Payer: Self-pay | Admitting: Emergency Medicine

## 2014-01-07 ENCOUNTER — Emergency Department (HOSPITAL_COMMUNITY): Payer: Medicaid Other

## 2014-01-07 ENCOUNTER — Emergency Department (HOSPITAL_COMMUNITY)
Admission: EM | Admit: 2014-01-07 | Discharge: 2014-01-07 | Disposition: A | Discharge status: Home / Self Care | Payer: Medicaid Other | Attending: Emergency Medicine | Admitting: Emergency Medicine

## 2014-01-07 DIAGNOSIS — R0602 Shortness of breath: Secondary | ICD-10-CM | POA: Insufficient documentation

## 2014-01-07 DIAGNOSIS — R51 Headache: Secondary | ICD-10-CM | POA: Insufficient documentation

## 2014-01-07 DIAGNOSIS — F411 Generalized anxiety disorder: Secondary | ICD-10-CM | POA: Insufficient documentation

## 2014-01-07 DIAGNOSIS — Z862 Personal history of diseases of the blood and blood-forming organs and certain disorders involving the immune mechanism: Secondary | ICD-10-CM | POA: Insufficient documentation

## 2014-01-07 DIAGNOSIS — R079 Chest pain, unspecified: Secondary | ICD-10-CM | POA: Insufficient documentation

## 2014-01-07 DIAGNOSIS — F419 Anxiety disorder, unspecified: Secondary | ICD-10-CM

## 2014-01-07 DIAGNOSIS — Z79899 Other long term (current) drug therapy: Secondary | ICD-10-CM | POA: Insufficient documentation

## 2014-01-07 LAB — BASIC METABOLIC PANEL
BUN: 11 mg/dL (ref 6–23)
CHLORIDE: 104 meq/L (ref 96–112)
CO2: 22 mEq/L (ref 19–32)
Calcium: 8.9 mg/dL (ref 8.4–10.5)
Creatinine, Ser: 0.69 mg/dL (ref 0.50–1.10)
GFR calc Af Amer: >90 mL/min (ref >90)
Glucose, Bld: 93 mg/dL (ref 70–99)
Potassium: 4 mEq/L (ref 3.7–5.3)
Sodium: 137 mEq/L (ref 137–147)

## 2014-01-07 LAB — CBC WITH DIFFERENTIAL/PLATELET
BASOS PCT: 0 % (ref 0–1)
Basophils Absolute: 0 10*3/uL (ref 0.0–0.1)
Eosinophils Absolute: 0 10*3/uL (ref 0.0–0.7)
Eosinophils Relative: 0 % (ref 0–5)
HEMATOCRIT: 36.8 % (ref 36.0–46.0)
HEMOGLOBIN: 11.6 g/dL — AB (ref 12.0–15.0)
Lymphocytes Relative: 42 % (ref 12–46)
Lymphs Abs: 2 10*3/uL (ref 0.7–4.0)
MCH: 28.3 pg (ref 26.0–34.0)
MCHC: 31.5 g/dL (ref 30.0–36.0)
MCV: 89.8 fL (ref 78.0–100.0)
MONO ABS: 0.3 10*3/uL (ref 0.1–1.0)
MONOS PCT: 7 % (ref 3–12)
Neutro Abs: 2.4 10*3/uL (ref 1.7–7.7)
Neutrophils Relative %: 51 % (ref 43–77)
Platelets: 270 10*3/uL (ref 150–400)
RBC: 4.1 MIL/uL (ref 3.87–5.11)
RDW: 12.4 % (ref 11.5–15.5)
WBC: 4.7 10*3/uL (ref 4.0–10.5)

## 2014-01-07 LAB — TROPONIN I: Troponin I: <0.3 ng/mL (ref <0.30)

## 2014-01-07 MED ORDER — ACETAMINOPHEN 325 MG PO TABS
650.0000 mg | ORAL_TABLET | Freq: Once | ORAL | Status: DC
  Filled 2014-01-07: qty 2

## 2014-01-07 NOTE — ED Provider Notes (Signed)
Medical screening examination/treatment/procedure(s) were conducted as a shared visit with non-physician practitioner(s) and myself.  I personally evaluated the patient during the encounter.   EKG Interpretation   Date/Time:  Thursday January 07 2014 08:00:43 EDT Ventricular Rate:  87 PR Interval:  127 QRS Duration: 83 QT Interval:  397 QTC Calculation: 478 R Axis:   32 Text Interpretation:  Sinus rhythm Borderline T abnormalities, inferior  leads Borderline prolonged QT interval No significant change since last  tracing Confirmed by Mart Colpitts  MD, [place] ([id number]) on 01/07/2014 8:14:37 AM      I interviewed and examined the patient. Lungs are CTAB. Cardiac exam wnl. Abdomen soft.  Sx c/w previous panic attacks. Pt now following w/ psychiatrist. Labs non-contrib. Low risk for MACE per HEART score. Will rec f/u w/ psych.   Blanchard Kelch, MD 01/07/14 2216

## 2014-01-07 NOTE — ED Provider Notes (Signed)
CSN: [id number]     Arrival date & time 01/07/14  0757 History   First MD Initiated Contact with Patient 01/07/14 0805     Chief Complaint  Patient presents with  . Chest Pain   HPI  Breanna Thomas is a 33 y.o. female with a PMH of anemia, vertigo, and anxiety who presents to the ED for evaluation of chest pain. History was provided by the patient and her mother. Patient states she woke up with chest tightness which started at 5:00 this morning. States she also felt diaphoretic and "clammy", lightheaded, SOB, and anxious. Similar to panic attacks in the past. Patient denies any nausea, palpitations, emesis, cough, leg edema, dizziness. Her symptoms have almost resolved and she feels "much better." States now has developed a right sided headache. Has hx of panic attacks in the past. Patient has had several 911 calls this week for panic attacks. She was seen at [facility] yesterday and has a new psychiatrist who started a new prescription. She currently takes Buspar for anxiety and is planning to pick up her new prescription today (unknown medication). She also previously took Ativan but was told to stop taking this. Patient is a single mother of 3 children and is very stressed. She denies any hx of tobacco use. No drug use including cocaine. No FH of cardiac disease. No recent fevers, chills, cough, or other concerns. No history of DVT, PE, clotting disorder, cancer, recent surgery, immobilization, travel. On implanon for contraception.     Past Medical History  Diagnosis Date  . Anemia   . Vertigo   . Anxiety    Past Surgical History  Procedure Laterality Date  . No past surgeries     Family History  Problem Relation Age of Onset  . Seizures    . Heart failure    . Diabetes Mother   . Hypertension Mother   . Heart disease Father   . Seizures Father   . Diabetes Sister    History  Substance Use Topics  . Smoking status: Never Smoker   . Smokeless tobacco: Never Used  . Alcohol Use:  No     Comment: social   OB History   Grav Para Term Preterm Abortions TAB SAB Ect Mult Living   3 3             Review of Systems  Constitutional: Negative for fever, chills, activity change, appetite change and fatigue.  HENT: Negative for congestion, rhinorrhea and sore throat.   Respiratory: Positive for chest tightness and shortness of breath. Negative for cough and wheezing.   Cardiovascular: Positive for chest pain. Negative for palpitations and leg swelling.  Gastrointestinal: Negative for nausea, vomiting and abdominal pain.  Genitourinary: Negative for dysuria and difficulty urinating.  Musculoskeletal: Negative for back pain and myalgias.  Neurological: Positive for light-headedness and headaches. Negative for dizziness, syncope, weakness and numbness.    Allergies  Review of patient's allergies indicates no known allergies.  Home Medications   Prior to Admission medications   Medication Sig Start Date End Date Taking? Authorizing Provider  busPIRone (BUSPAR) 15 MG tablet Take 1 tablet (15 mg total) by mouth 2 (two) times daily. 12/30/13  Yes Lorayne Marek, MD  etonogestrel (IMPLANON) 68 MG IMPL implant Inject 1 each into the skin once.   Yes Historical Provider, MD  LORazepam (ATIVAN) 1 MG tablet Take 1 mg by mouth every 8 (eight) hours as needed for anxiety.   Yes Historical Provider, MD  Vitamin  D, Ergocalciferol, (DRISDOL) 50000 UNITS CAPS capsule Take 1 capsule (50,000 Units total) by mouth every 7 (seven) days. 12/16/13  Yes Deepak Advani, MD   BP 119/76  Pulse 83  Temp(Src) 98.3 F (36.8 C)  Resp 20  SpO2 100%  LMP 12/31/2013  Filed Vitals:   01/07/14 0804 01/07/14 0830 01/07/14 0926 01/07/14 1110  BP: 119/76  119/82 112/85  Pulse: 83 82 64 74  Temp: 98.3 F (36.8 C)     Resp: 20 18 20 18   SpO2: 100% 100% 100% 100%    Physical Exam  Nursing note and vitals reviewed. Constitutional: She is oriented to person, place, and time. She appears  well-developed and well-nourished. No distress.  HENT:  Head: Normocephalic and atraumatic.  Right Ear: External ear normal.  Left Ear: External ear normal.  Mouth/Throat: Oropharynx is clear and moist.  Eyes: Conjunctivae are normal. Right eye exhibits no discharge. Left eye exhibits no discharge.  Neck: Normal range of motion. Neck supple.  Cardiovascular: Normal rate, regular rhythm, normal heart sounds and intact distal pulses.  Exam reveals no gallop and no friction rub.   No murmur heard. Pulmonary/Chest: Effort normal and breath sounds normal. No respiratory distress. She has no wheezes. She has no rales. She exhibits no tenderness.  Abdominal: Soft. She exhibits no distension. There is no tenderness.  Musculoskeletal: Normal range of motion. She exhibits no edema and no tenderness.  No LE edema or calf tenderness bilaterally  Neurological: She is alert and oriented to person, place, and time.  Skin: Skin is warm and dry. She is not diaphoretic.    ED Course  Procedures (including critical care time) Labs Review Labs Reviewed - No data to display  Imaging Review Dg Chest 2 View  01/07/2014   CLINICAL DATA:  Chest pain  EXAM: CHEST  2 VIEW  COMPARISON:  04/06/2013  FINDINGS: The heart size and mediastinal contours are within normal limits. Both lungs are clear. The visualized skeletal structures are unremarkable.  IMPRESSION: No active cardiopulmonary disease.   Electronically Signed   By: Lahoma Crocker M.D.   On: 01/07/2014 09:16     EKG Interpretation   Date/Time:  Thursday January 07 2014 08:00:43 EDT Ventricular Rate:  87 PR Interval:  127 QRS Duration: 83 QT Interval:  397 QTC Calculation: 478 R Axis:   32 Text Interpretation:  Sinus rhythm Borderline T abnormalities, inferior  leads Borderline prolonged QT interval No significant change since last  tracing Confirmed by HARRISON  MD, [place] ([id number]) on 01/07/2014 8:14:37 AM      Results for orders placed during the  hospital encounter of 01/07/14  CBC WITH DIFFERENTIAL      Result Value Ref Range   WBC 4.7  4.0 - 10.5 K/uL   RBC 4.10  3.87 - 5.11 MIL/uL   Hemoglobin 11.6 (*) 12.0 - 15.0 g/dL   HCT 36.8  36.0 - 46.0 %   MCV 89.8  78.0 - 100.0 fL   MCH 28.3  26.0 - 34.0 pg   MCHC 31.5  30.0 - 36.0 g/dL   RDW 12.4  11.5 - 15.5 %   Platelets 270  150 - 400 K/uL   Neutrophils Relative % 51  43 - 77 %   Neutro Abs 2.4  1.7 - 7.7 K/uL   Lymphocytes Relative 42  12 - 46 %   Lymphs Abs 2.0  0.7 - 4.0 K/uL   Monocytes Relative 7  3 - 12 %   Monocytes  Absolute 0.3  0.1 - 1.0 K/uL   Eosinophils Relative 0  0 - 5 %   Eosinophils Absolute 0.0  0.0 - 0.7 K/uL   Basophils Relative 0  0 - 1 %   Basophils Absolute 0.0  0.0 - 0.1 K/uL  BASIC METABOLIC PANEL      Result Value Ref Range   Sodium 137  137 - 147 mEq/L   Potassium 4.0  3.7 - 5.3 mEq/L   Chloride 104  96 - 112 mEq/L   CO2 22  19 - 32 mEq/L   Glucose, Bld 93  70 - 99 mg/dL   BUN 11  6 - 23 mg/dL   Creatinine, Ser 0.69  0.50 - 1.10 mg/dL   Calcium 8.9  8.4 - 10.5 mg/dL   GFR calc non Af Amer >90  >90 mL/min   GFR calc Af Amer >90  >90 mL/min  TROPONIN I      Result Value Ref Range   Troponin I <0.30  <0.30 ng/mL     MDM   CHESSA BARRASSO is a 33 y.o. female with a PMH of anemia, vertigo, and anxiety who presents to the ED for evaluation of chest pain. Etiology of chest pain likely due to anxiety. Patient has had similar presentations in the past. Currently seeing a psychiatrist for this and is starting a new medication. EKG negative for any acute ischemic changes. Chest x-ray negative for an acute cardiopulmonary process. Troponin negative. No risk factors for cardiac disease. Vital signs stable. Has exogenous estrogen with implanon, however, doubt PE. No tachycardia, tachypnea, or hypoxia. Vital signs stable. Patient had improvements in her condition throughout her ED visit. Social work evaluated patient for further resources for her anxiety.  Return precautions, discharge instructions, and follow-up was discussed with the patient before discharge.    Rechecks  9:45 AM = Headache improving. No chest pain or SOB. Patient's condition improved. Talking with social worker about plan for anxiety.     Discharge Medication List as of 01/07/2014  9:47 AM       Final impressions: 1. Chest pain   2. Anxiety      Mercy Moore PA-C   This patient was discussed with Dr. Floria Raveling, PA-C 01/07/14 1213

## 2014-01-07 NOTE — ED Notes (Signed)
Social work at bedside with patient and patient's mother.

## 2014-01-07 NOTE — ED Notes (Signed)
Patient transported to X-ray 

## 2014-01-07 NOTE — ED Notes (Signed)
MD at bedside. (Dr. Harrison) 

## 2014-01-07 NOTE — Discharge Instructions (Signed)
Follow-up with your psychiatrist in 3 weeks  Pick up your new prescription for your new medication and take as directed  Return to the emergency department if you develop any changing/worsening condition, difficulty breathing, fever, coughing up blood, or any other concerns (please read additional information regarding your condition below)   Chest Pain (Nonspecific) It is often hard to give a specific diagnosis for the cause of chest pain. There is always a chance that your pain could be related to something serious, such as a heart attack or a blood clot in the lungs. You need to follow up with your health care provider for further evaluation. CAUSES   Heartburn.  Pneumonia or bronchitis.  Anxiety or stress.  Inflammation around your heart (pericarditis) or lung (pleuritis or pleurisy).  A blood clot in the lung.  A collapsed lung (pneumothorax). It can develop suddenly on its own (spontaneous pneumothorax) or from trauma to the chest.  Shingles infection (herpes zoster virus). The chest wall is composed of bones, muscles, and cartilage. Any of these can be the source of the pain.  The bones can be bruised by injury.  The muscles or cartilage can be strained by coughing or overwork.  The cartilage can be affected by inflammation and become sore (costochondritis). DIAGNOSIS  Lab tests or other studies may be needed to find the cause of your pain. Your health care provider may have you take a test called an ambulatory electrocardiogram (ECG). An ECG records your heartbeat patterns over a 24-hour period. You may also have other tests, such as:  Transthoracic echocardiogram (TTE). During echocardiography, sound waves are used to evaluate how blood flows through your heart.  Transesophageal echocardiogram (TEE).  Cardiac monitoring. This allows your health care provider to monitor your heart rate and rhythm in real time.  Holter monitor. This is a portable device that records your  heartbeat and can help diagnose heart arrhythmias. It allows your health care provider to track your heart activity for several days, if needed.  Stress tests by exercise or by giving medicine that makes the heart beat faster. TREATMENT   Treatment depends on what may be causing your chest pain. Treatment may include:  Acid blockers for heartburn.  Anti-inflammatory medicine.  Pain medicine for inflammatory conditions.  Antibiotics if an infection is present.  You may be advised to change lifestyle habits. This includes stopping smoking and avoiding alcohol, caffeine, and chocolate.  You may be advised to keep your head raised (elevated) when sleeping. This reduces the chance of acid going backward from your stomach into your esophagus. Most of the time, nonspecific chest pain will improve within 2-3 days with rest and mild pain medicine.  HOME CARE INSTRUCTIONS   If antibiotics were prescribed, take them as directed. Finish them even if you start to feel better.  For the next few days, avoid physical activities that bring on chest pain. Continue physical activities as directed.  Do not use any tobacco products, including cigarettes, chewing tobacco, or electronic cigarettes.  Avoid drinking alcohol.  Only take medicine as directed by your health care provider.  Follow your health care provider's suggestions for further testing if your chest pain does not go away.  Keep any follow-up appointments you made. If you do not go to an appointment, you could develop lasting (chronic) problems with pain. If there is any problem keeping an appointment, call to reschedule. SEEK MEDICAL CARE IF:   Your chest pain does not go away, even after treatment.  You have a rash with blisters on your chest.  You have a fever. SEEK IMMEDIATE MEDICAL CARE IF:   You have increased chest pain or pain that spreads to your arm, neck, jaw, back, or abdomen.  You have shortness of breath.  You have  an increasing cough, or you cough up blood.  You have severe back or abdominal pain.  You feel nauseous or vomit.  You have severe weakness.  You faint.  You have chills. This is an emergency. Do not wait to see if the pain will go away. Get medical help at once. Call your local emergency services (911 in U.S.). Do not drive yourself to the hospital. MAKE SURE YOU:   Understand these instructions.  Will watch your condition.  Will get help right away if you are not doing well or get worse. Document Released: 04/18/2005 Document Revised: 07/14/2013 Document Reviewed: 02/12/2008 [facility] Patient Information 2015 [place], [place]. This information is not intended to replace advice given to you by your health care provider. Make sure you discuss any questions you have with your health care provider.  Generalized Anxiety Disorder Generalized anxiety disorder (GAD) is a mental disorder. It interferes with life functions, including relationships, work, and school. GAD is different from normal anxiety, which everyone experiences at some point in their lives in response to specific life events and activities. Normal anxiety actually helps [place] prepare for and get through these life events and activities. Normal anxiety goes away after the event or activity is over.  GAD causes anxiety that is not necessarily related to specific events or activities. It also causes excess anxiety in proportion to specific events or activities. The anxiety associated with GAD is also difficult to control. GAD can vary from mild to severe. People with severe GAD can have intense waves of anxiety with physical symptoms (panic attacks).  SYMPTOMS The anxiety and worry associated with GAD are difficult to control. This anxiety and worry are related to many life events and activities and also occur more days than not for 6 months or longer. People with GAD also have three or more of the following symptoms (one or more in  children):  Restlessness.   Fatigue.  Difficulty concentrating.   Irritability.  Muscle tension.  Difficulty sleeping or unsatisfying sleep. DIAGNOSIS GAD is diagnosed through an assessment by your caregiver. Your caregiver will ask you questions aboutyour mood,physical symptoms, and events in your life. Your caregiver may ask you about your medical history and use of alcohol or drugs, including prescription medications. Your caregiver may also do a physical exam and blood tests. Certain medical conditions and the use of certain substances can cause symptoms similar to those associated with GAD. Your caregiver may refer you to a mental health specialist for further evaluation. TREATMENT The following therapies are usually used to treat GAD:   Medication--Antidepressant medication usually is prescribed for long-term daily control. Antianxiety medications may be added in severe cases, especially when panic attacks occur.   Talk therapy (psychotherapy)--Certain types of talk therapy can be helpful in treating GAD by providing support, education, and guidance. A form of talk therapy called cognitive behavioral therapy can teach you healthy ways to think about and react to daily life events and activities.  Stress managementtechniques--These include yoga, meditation, and exercise and can be very helpful when they are practiced regularly. A mental health specialist can help determine which treatment is best for you. Some people see improvement with one therapy. However, other people require a  combination of therapies. Document Released: 11/03/2012 Document Reviewed: 11/03/2012 [facility] Patient Information 2015 [place]. This information is not intended to replace advice given to you by your health care provider. Make sure you discuss any questions you have with your health care provider.   Emergency Department Resource Guide 1) Find a Doctor and Pay Out of Pocket Although you  won't have to find out who is covered by your insurance plan, it is a good idea to ask around and get recommendations. You will then need to call the office and see if the doctor you have chosen will accept you as a new patient and what types of options they offer for patients who are self-pay. Some doctors offer discounts or will set up payment plans for their patients who do not have insurance, but you will need to ask so you aren't surprised when you get to your appointment.  2) Contact Your Local Health Department Not all health departments have doctors that can see patients for sick visits, but many do, so it is worth a call to see if yours does. If you don't know where your local health department is, you can check in your phone book. The CDC also has a tool to help you locate your state's health department, and many state websites also have listings of all of their local health departments.  3) Find a [facility] If your illness is not likely to be very severe or complicated, you may want to try a walk in clinic. These are popping up all over the country in pharmacies, drugstores, and shopping centers. They're usually staffed by nurse practitioners or physician assistants that have been trained to treat common illnesses and complaints. They're usually fairly quick and inexpensive. However, if you have serious medical issues or chronic medical problems, these are probably not your best option.  No Primary Care Doctor: - Call Health Connect at  [phone] - they can help you locate a primary care doctor that  accepts your insurance, provides certain services, etc. - Physician Referral Service- [phone]  Chronic Pain Problems: Organization         Address  Phone   Notes  [facility]  [phone] Patients need to be referred by their primary care doctor.   Medication Assistance: Organization         Address  Phone   Notes  [facility] Medication [facility] [address] [place], [place] [address] [phone] --Must be a resident of [facility] -- Must have NO insurance coverage whatsoever (no Medicaid/ Medicare, etc.) -- The pt. MUST have a primary care doctor that directs their care regularly and follows them in the community   MedAssist  [phone]   [employer]  [phone]    Agencies that provide inexpensive medical care: Organization         Address  Phone   Notes  [place]  [phone]   [facility]    [phone]   [facility] [place], [place] [address] [phone]   [place] [address] [place] [phone]   Planned Parenthood    [phone]   [facility]    [phone]   [place] and [address], [place] Phone:  [phone], Fax:  [phone] Hours of Operation:  9 am - 6 pm, M-F.  Also accepts Medicaid/Medicare and self-pay.  [facility] for [address], [address], [place] Phone: [phone], Fax: [phone]. Hours of Operation:  8:30 am - 5:30 pm, M-F.  Also accepts Medicaid and self-pay.  [facility] [place] [address] [place] Phone: [phone]   [place], [place], [place] [phone], Ext. [phone] Mondays & Thursdays: 7-9 AM.  First 15 patients are seen on a first come, first serve basis.    [place] Providers:  Organization         Address  Phone   Notes  [facility] [address], [place] [phone] Also accepts self-pay patients.  [facility] [address], [place]  [phone]   [place], [address], [place] [phone]   [facility] [address] [place] [phone]   Lucianne Lei [address], [place]   [phone] Only accepts [place] Access [place] patients after they have their name applied to their card.   Self-Pay (no insurance) in [facility]:  Organization         Address  Phone   Notes  Sickle Cell Patients, [facility] [place] [phone]   [facility] [place] [phone]   [facility] [place]  [place], [place], [place] [phone]   [facility]/Dr. Osei-Bonsu  [address] [place] or [address], [place] [phone] Phone number for both [place] and [place] locations is the same.  Urgent Medical and [facility] [address] [place] [phone]   [facility] [address] [place] or [address] [phone]   [facility] [address], [place] [phone], phone; [phone], fax Sees patients 1st and 3rd Saturday of every month.  Must not qualify for public or private insurance (i.e. Medicaid, Medicare, [place] Health Choice, Veterans' Benefits)  Household income should be no more than 200% of the poverty level The clinic cannot treat you if you are pregnant or think you are pregnant  Sexually transmitted diseases are not treated at the clinic.    Dental Care: Organization         Address  Phone  Notes  [facility] of [facility] [place] [phone] Accepts children up to age 42 who are enrolled in [place] or [place]; pregnant women with a Medicaid card; and children who have applied for Medicaid or [place] Health Choice, but were declined, whose parents can pay a reduced fee at time of service.  [facility] Department of [facility]  [address], [place] [phone] Accepts  children up to age 58 who are enrolled in [place] or [place]; pregnant women with a Medicaid card; and children who have applied for Medicaid or [place] Health Choice, but were declined, whose parents can pay a reduced fee at time of service.  [place] Adult Dental Access PROGRAM  [place] [phone] Patients are seen by appointment only. Walk-ins are not accepted. [place] will see patients 50 years of age and older. Monday - Tuesday (  8am-5pm) Most Wednesdays (8:30-5pm) $30 per visit, cash only  [facility] Adult Dental Access PROGRAM  [address], [facility] [phone] Patients are seen by appointment only. Walk-ins are not accepted. [place] will see patients 21 years of age and older. One Wednesday Evening (Monthly: Volunteer Based).  $30 per visit, cash only  [place]  [phone] for adults; Children under age 45, call Graduate Pediatric Dentistry at [phone]. Children aged 43-14, please call [phone] to request a pediatric application.  Dental services are provided in all areas of dental care including fillings, crowns and bridges, complete and partial dentures, implants, gum treatment, root canals, and extractions. Preventive care is also provided. Treatment is provided to both adults and children. Patients are selected via a lottery and there is often a waiting list.   [facility] [address] [place]  [phone] [email or web address]   [facility] [address] [place], [place] [phone], Ext. [phone] Second and Fourth Thursday of each month, opens at 6:30 AM; Clinic ends at 9 AM.  Patients are seen on a first-come first-served basis, and a limited number are seen during each clinic.   [facility]  [address] Hillard Danker [place], [place] [phone]   Eligibility Requirements You must have lived in [place], [place], or [place] counties for at least the  last three months.   You cannot be eligible for state or federal sponsored [employer], including [employer], [place], or [employer].   You generally cannot be eligible for healthcare insurance through your employer.    How to apply: Eligibility screenings are held every Tuesday and Wednesday afternoon from 1:00 pm until 4:00 pm. You do not need an appointment for the interview!  [facility] [address], [place], [place]   [place]  [place] Department  [place]  [phone]    Behavioral Health Resources in the Community: Intensive Outpatient Programs Organization         Address  Phone  Notes  [place]. [address] [place], [place] [phone]   [facility] Outpatient [address] [place], [place]   ADS: Alcohol & Drug Svcs [address], [place], [place]   [place] [address]  [place], [place] or [phone]   Substance Abuse Resources Organization         Address  Phone  Notes  Alcohol and Drug Services  [phone]   [place]  [phone]   [facility] [place]   Chinita Pester  [phone]   Residential & Outpatient Substance Abuse Program  [phone]   Psychological Services Organization         Address  Phone  Notes  [facility] [place]  [place]  [phone]   [place] [address], [place] or [phone]    Mobile Crisis Teams Organization         Address  Phone  Notes  [facility], Mobile Crisis Care Unit  [phone]   [facility]  [address] [place], [place]   [place] Levels [address], [place] [phone]     Self-Help/Support Groups Organization         Address  Phone  Notes  Mental Health Assoc. of [place] - variety of support groups  [place] Call for more information  Narcotics Anonymous (NA), [facility] [address], [employer] [place]  2 meetings at this location   Special educational needs teacher         Address  Phone  Notes  [facility] [place],    [place]  [phone]   [facility]  [address] [place] [address], [place], [place]   [place], [place] [phone] Admissions: 8am-3pm M-F  Incentives Substance [place] [address]    [place], [place] [phone]   [facility] [address] [place], [employer], [place]   [facility] [address]  [place], [place]   [facility] Programs - Intensive Outpatient [address], [place], [place]   [facility].,  [place], [place] [phone] or [phone]   [facility] (RTS) [address], [place], [place] Accepts Medicaid  [facility] [place] [address],  [place] [phone] Substance Abuse/Addiction Treatment   [facility] [place] Organization         Address  Phone  Notes  [facility]  [phone]   Domenic Schwab, PhD [address] Arlis Porta [place], [place]   9053483088 or [phone]   [place], [place] [phone]   [facility] [address], [place], [place] [phone] Insurance/Medicaid/sponsorship through [facility] and Families [address] [place]                                    [place], [place] [phone] [place] [address], [place] [phone]    Dr. Adele Schilder  [phone]   Free  Clinic of [place] Dept. 1) [address] [place] 2) [place] 3)  [place] [address], [place] [phone]  [phone]   [place] [phone] or [phone] (After Hours)

## 2014-01-07 NOTE — ED Notes (Signed)
Pt arrives [facility] from home for chest pain that began at 0500 this morning. Pt states it woke her up out of her sleep. Pt with history of Anxiety. Pt takes Lorazepam. PCP instructed patient to stop taking medication. Pt stopped taking medication 2 days ago.

## 2014-01-07 NOTE — Progress Notes (Signed)
CSW consult to pt regarding anxiety and requesting for Cognitive Behavioral Therapy (CBT). CSW spoke with pt regarding coping skills to combat automatic negative thoughts. CSW shared information on breathing techniques to assist with anxiety. CSW gave resources regarding Mobile Crisis Unit for assistance. Pt [facility], pt wanted to know if [facility] has a CBT group. CSW suggested that pt stop by [facility] to inquire about the group for CSW could not reach by phone. CSW shared information on how to apply for SSDI per pt.'s request. Pt/pt.'s mother voiced understanding and appreciative of information shared. No further CSW intervention needed.   [address] [place]

## 2014-01-11 ENCOUNTER — Encounter (HOSPITAL_COMMUNITY): Payer: Self-pay | Admitting: Emergency Medicine

## 2014-01-11 ENCOUNTER — Telehealth: Payer: Self-pay | Admitting: Internal Medicine

## 2014-01-11 ENCOUNTER — Emergency Department (HOSPITAL_COMMUNITY)
Admission: EM | Admit: 2014-01-11 | Discharge: 2014-01-11 | Disposition: A | Discharge status: Home / Self Care | Payer: Medicaid Other | Attending: Emergency Medicine | Admitting: Emergency Medicine

## 2014-01-11 DIAGNOSIS — T50905A Adverse effect of unspecified drugs, medicaments and biological substances, initial encounter: Secondary | ICD-10-CM

## 2014-01-11 DIAGNOSIS — F419 Anxiety disorder, unspecified: Secondary | ICD-10-CM

## 2014-01-11 DIAGNOSIS — R5383 Other fatigue: Principal | ICD-10-CM

## 2014-01-11 DIAGNOSIS — T43205A Adverse effect of unspecified antidepressants, initial encounter: Secondary | ICD-10-CM | POA: Insufficient documentation

## 2014-01-11 DIAGNOSIS — Z79899 Other long term (current) drug therapy: Secondary | ICD-10-CM | POA: Insufficient documentation

## 2014-01-11 DIAGNOSIS — F411 Generalized anxiety disorder: Secondary | ICD-10-CM | POA: Insufficient documentation

## 2014-01-11 DIAGNOSIS — N390 Urinary tract infection, site not specified: Secondary | ICD-10-CM

## 2014-01-11 DIAGNOSIS — Z862 Personal history of diseases of the blood and blood-forming organs and certain disorders involving the immune mechanism: Secondary | ICD-10-CM | POA: Insufficient documentation

## 2014-01-11 DIAGNOSIS — R630 Anorexia: Secondary | ICD-10-CM | POA: Insufficient documentation

## 2014-01-11 DIAGNOSIS — R5381 Other malaise: Secondary | ICD-10-CM | POA: Insufficient documentation

## 2014-01-11 DIAGNOSIS — R51 Headache: Secondary | ICD-10-CM | POA: Insufficient documentation

## 2014-01-11 DIAGNOSIS — Z3202 Encounter for pregnancy test, result negative: Secondary | ICD-10-CM | POA: Insufficient documentation

## 2014-01-11 LAB — URINALYSIS, ROUTINE W REFLEX MICROSCOPIC
BILIRUBIN URINE: NEGATIVE
Glucose, UA: NEGATIVE mg/dL
Ketones, ur: >80 mg/dL — AB
Nitrite: NEGATIVE
PROTEIN: NEGATIVE mg/dL
Specific Gravity, Urine: 1.022 (ref 1.005–1.030)
UROBILINOGEN UA: 2 mg/dL — AB (ref 0.0–1.0)
pH: 6 (ref 5.0–8.0)

## 2014-01-11 LAB — I-STAT CHEM 8, ED
BUN: 8 mg/dL (ref 6–23)
CALCIUM ION: 1.13 mmol/L (ref 1.12–1.23)
Chloride: 107 mEq/L (ref 96–112)
Creatinine, Ser: 0.6 mg/dL (ref 0.50–1.10)
GLUCOSE: 74 mg/dL (ref 70–99)
HEMATOCRIT: 39 % (ref 36.0–46.0)
HEMOGLOBIN: 13.3 g/dL (ref 12.0–15.0)
POTASSIUM: 3 meq/L — AB (ref 3.7–5.3)
Sodium: 145 mEq/L (ref 137–147)
TCO2: 20 mmol/L (ref 0–100)

## 2014-01-11 LAB — CBC WITH DIFFERENTIAL/PLATELET
BASOS ABS: 0 10*3/uL (ref 0.0–0.1)
BASOS PCT: 0 % (ref 0–1)
EOS ABS: 0 10*3/uL (ref 0.0–0.7)
Eosinophils Relative: 1 % (ref 0–5)
HCT: 40.5 % (ref 36.0–46.0)
HEMOGLOBIN: 13.1 g/dL (ref 12.0–15.0)
Lymphocytes Relative: 47 % — ABNORMAL HIGH (ref 12–46)
Lymphs Abs: 2.3 10*3/uL (ref 0.7–4.0)
MCH: 28.4 pg (ref 26.0–34.0)
MCHC: 32.3 g/dL (ref 30.0–36.0)
MCV: 87.9 fL (ref 78.0–100.0)
MONOS PCT: 9 % (ref 3–12)
Monocytes Absolute: 0.4 10*3/uL (ref 0.1–1.0)
Neutro Abs: 2 10*3/uL (ref 1.7–7.7)
Neutrophils Relative %: 43 % (ref 43–77)
Platelets: 306 10*3/uL (ref 150–400)
RBC: 4.61 MIL/uL (ref 3.87–5.11)
RDW: 12 % (ref 11.5–15.5)
WBC: 4.7 10*3/uL (ref 4.0–10.5)

## 2014-01-11 LAB — URINE MICROSCOPIC-ADD ON

## 2014-01-11 LAB — CK: CK TOTAL: 77 U/L (ref 7–177)

## 2014-01-11 LAB — PREGNANCY, URINE: Preg Test, Ur: NEGATIVE

## 2014-01-11 MED ORDER — POTASSIUM CHLORIDE CRYS ER 20 MEQ PO TBCR
40.0000 meq | EXTENDED_RELEASE_TABLET | Freq: Once | ORAL | Status: AC
  Administered 2014-01-11: 40 meq via ORAL
  Filled 2014-01-11: qty 2

## 2014-01-11 MED ORDER — LORAZEPAM 0.5 MG PO TABS
0.5000 mg | ORAL_TABLET | Freq: Once | ORAL | Status: AC
  Administered 2014-01-11: 0.5 mg via ORAL

## 2014-01-11 MED ORDER — SODIUM CHLORIDE 0.9 % IV BOLUS (SEPSIS)
1000.0000 mL | Freq: Once | INTRAVENOUS | Status: AC
  Administered 2014-01-11: 1000 mL via INTRAVENOUS

## 2014-01-11 MED ORDER — CIPROFLOXACIN HCL 500 MG PO TABS: 500.0000 mg | ORAL_TABLET | Freq: Two times a day (BID) | ORAL | Status: DC

## 2014-01-11 NOTE — Progress Notes (Signed)
[facility] CL spoke with patient about [employer]. Patient PCP is [facility]. CL provided pt with upcoming apt information with pcp. Patient expressed concerns with CL about new medication prescribed for anxiety, making her sick. Stated to CL that she has anxiety but can still function until pt started taking new medication. Patient stated now her head fills heavy and she can't get out of bed. CL provided pt with upcoming pcp apt information.

## 2014-01-11 NOTE — ED Notes (Signed)
Toilet offered. Pt  Unable to give urine at this time

## 2014-01-11 NOTE — ED Notes (Signed)
Patient has returned from restroom, patient was unable to provide specimen- RN Renita notified

## 2014-01-11 NOTE — ED Notes (Signed)
Pt c/o head pain and weakness x 3 days. Pt c/o nausea but no vomiting. Pt also states she has blurred vision at times.

## 2014-01-11 NOTE — ED Provider Notes (Addendum)
CSN: [id number]     Arrival date & time 01/11/14  0849 History   First MD Initiated Contact with Patient 01/11/14 [phone]     Chief Complaint  Patient presents with  . Headache  . Weakness     (Consider location/radiation/quality/duration/timing/severity/associated sxs/prior Treatment) HPI Comments: Patient presents with generalized weakness. She states she started a new medication for anxiety 3 days ago and the day after she started she started feeling weak all over. She says her arms and legs feel heavy. She's remained in bed for the last 2 days. She feels at times lightheaded and at times dizzy. She says it's worse when she is up walking. She denies any spinning sensation. She denies any headache but says her head feels swollen. She denies any nausea or vomiting. She feels like the symptoms could be related to a new medication. She is [facility]. She denies any URI symptoms other than some mild rhinorrhea. She denies any urinary symptoms. She denies any abdominal pain, nausea or vomiting. She denies he fevers or chills.  Patient is a 33 y.o. female presenting with headaches and weakness.  Headache Associated symptoms: dizziness, fatigue and myalgias   Associated symptoms: no abdominal pain, no back pain, no congestion, no cough, no diarrhea, no fever, no nausea, no numbness and no vomiting   Weakness Pertinent negatives include no chest pain, no abdominal pain, no headaches and no shortness of breath.    Past Medical History  Diagnosis Date  . Anemia   . Vertigo   . Anxiety    Past Surgical History  Procedure Laterality Date  . No past surgeries     Family History  Problem Relation Age of Onset  . Seizures    . Heart failure    . Diabetes Mother   . Hypertension Mother   . Heart disease Father   . Seizures Father   . Diabetes Sister    History  Substance Use Topics  . Smoking status: Never Smoker   . Smokeless tobacco: Never Used  .  Alcohol Use: No     Comment: social   OB History   Grav Para Term Preterm Abortions TAB SAB Ect Mult Living   3 3             Review of Systems  Constitutional: Positive for activity change, appetite change and fatigue. Negative for fever, chills and diaphoresis.  HENT: Negative for congestion, rhinorrhea and sneezing.   Eyes: Negative.   Respiratory: Negative for cough, chest tightness and shortness of breath.   Cardiovascular: Negative for chest pain and leg swelling.  Gastrointestinal: Negative for nausea, vomiting, abdominal pain, diarrhea and blood in stool.  Genitourinary: Negative for frequency, hematuria, flank pain and difficulty urinating.  Musculoskeletal: Positive for myalgias. Negative for arthralgias and back pain.  Skin: Negative for rash.  Neurological: Positive for dizziness, weakness and light-headedness. Negative for speech difficulty, numbness and headaches.      Allergies  Review of patient's allergies indicates no known allergies.  Home Medications   Prior to Admission medications   Medication Sig Start Date End Date Taking? Authorizing Provider  etonogestrel (IMPLANON) 68 MG IMPL implant Inject 1 each into the skin once.   Yes Historical Provider, MD  LORazepam (ATIVAN) 1 MG tablet Take 1 mg by mouth every 8 (eight) hours as needed for anxiety.   Yes Historical Provider, MD  traZODone (DESYREL) 50 MG tablet Take 50 mg by mouth at bedtime.  Yes Historical Provider, MD  venlafaxine (EFFEXOR) 75 MG tablet Take 75 mg by mouth 2 (two) times daily.   Yes Historical Provider, MD  Vitamin D, Ergocalciferol, (DRISDOL) 50000 UNITS CAPS capsule Take 1 capsule (50,000 Units total) by mouth every 7 (seven) days. 12/16/13  Yes Lorayne Marek, MD  ciprofloxacin (CIPRO) 500 MG tablet Take 1 tablet (500 mg total) by mouth 2 (two) times daily. One po bid x 7 days 01/11/14   Malvin Johns, MD   BP 126/84  Pulse 96  Temp(Src) 98.5 F (36.9 C) (Oral)  Resp 20  SpO2 96%  LMP  12/31/2013 Physical Exam  Constitutional: She is oriented to person, place, and time. She appears well-developed and well-nourished.  HENT:  Head: Normocephalic and atraumatic.  Eyes: Pupils are equal, round, and reactive to light.  Neck: Normal range of motion. Neck supple.  Cardiovascular: Normal rate, regular rhythm and normal heart sounds.   Pulmonary/Chest: Effort normal and breath sounds normal. No respiratory distress. She has no wheezes. She has no rales. She exhibits no tenderness.  Abdominal: Soft. Bowel sounds are normal. There is no tenderness. There is no rebound and no guarding.  Musculoskeletal: Normal range of motion. She exhibits no edema.  Lymphadenopathy:    She has no cervical adenopathy.  Neurological: She is alert and oriented to person, place, and time. She has normal strength. No cranial nerve deficit or sensory deficit. GCS eye subscore is 4. GCS verbal subscore is 5. GCS motor subscore is 6.  Finger to nose intact. No pronator drift  Skin: Skin is warm and dry. No rash noted.  Psychiatric: She has a normal mood and affect.    ED Course  Procedures (including critical care time) Labs Review Results for orders placed during the hospital encounter of 01/11/14  CBC WITH DIFFERENTIAL      Result Value Ref Range   WBC 4.7  4.0 - 10.5 K/uL   RBC 4.61  3.87 - 5.11 MIL/uL   Hemoglobin 13.1  12.0 - 15.0 g/dL   HCT 40.5  36.0 - 46.0 %   MCV 87.9  78.0 - 100.0 fL   MCH 28.4  26.0 - 34.0 pg   MCHC 32.3  30.0 - 36.0 g/dL   RDW 12.0  11.5 - 15.5 %   Platelets 306  150 - 400 K/uL   Neutrophils Relative % 43  43 - 77 %   Neutro Abs 2.0  1.7 - 7.7 K/uL   Lymphocytes Relative 47 (*) 12 - 46 %   Lymphs Abs 2.3  0.7 - 4.0 K/uL   Monocytes Relative 9  3 - 12 %   Monocytes Absolute 0.4  0.1 - 1.0 K/uL   Eosinophils Relative 1  0 - 5 %   Eosinophils Absolute 0.0  0.0 - 0.7 K/uL   Basophils Relative 0  0 - 1 %   Basophils Absolute 0.0  0.0 - 0.1 K/uL  CK      Result  Value Ref Range   Total CK 77  7 - 177 U/L  URINALYSIS, ROUTINE W REFLEX MICROSCOPIC      Result Value Ref Range   Color, Urine YELLOW  YELLOW   APPearance TURBID (*) CLEAR   Specific Gravity, Urine 1.022  1.005 - 1.030   pH 6.0  5.0 - 8.0   Glucose, UA NEGATIVE  NEGATIVE mg/dL   Hgb urine dipstick LARGE (*) NEGATIVE   Bilirubin Urine NEGATIVE  NEGATIVE   Ketones, ur >80 (*)  NEGATIVE mg/dL   Protein, ur NEGATIVE  NEGATIVE mg/dL   Urobilinogen, UA 2.0 (*) 0.0 - 1.0 mg/dL   Nitrite NEGATIVE  NEGATIVE   Leukocytes, UA MODERATE (*) NEGATIVE  PREGNANCY, URINE      Result Value Ref Range   Preg Test, Ur NEGATIVE  NEGATIVE  URINE MICROSCOPIC-ADD ON      Result Value Ref Range   Squamous Epithelial / LPF FEW (*) RARE   WBC, UA 7-10  <3 WBC/hpf   RBC / HPF 3-6  <3 RBC/hpf   Bacteria, UA MANY (*) RARE  I-STAT CHEM 8, ED      Result Value Ref Range   Sodium 145  137 - 147 mEq/L   Potassium 3.0 (*) 3.7 - 5.3 mEq/L   Chloride 107  96 - 112 mEq/L   BUN 8  6 - 23 mg/dL   Creatinine, Ser 0.60  0.50 - 1.10 mg/dL   Glucose, Bld 74  70 - 99 mg/dL   Calcium, Ion 1.13  1.12 - 1.23 mmol/L   TCO2 20  0 - 100 mmol/L   Hemoglobin 13.3  12.0 - 15.0 g/dL   HCT 39.0  36.0 - 46.0 %   Dg Chest 2 View  01/07/2014   CLINICAL DATA:  Chest pain  EXAM: CHEST  2 VIEW  COMPARISON:  04/06/2013  FINDINGS: The heart size and mediastinal contours are within normal limits. Both lungs are clear. The visualized skeletal structures are unremarkable.  IMPRESSION: No active cardiopulmonary disease.   Electronically Signed   By: Lahoma Crocker M.D.   On: 01/07/2014 09:16   Dg Knee Complete 4 Views Right  12/31/2013   CLINICAL DATA:  Anterior knee pain and swelling 1 month.  No injury.  EXAM: RIGHT KNEE - COMPLETE 4+ VIEW  COMPARISON:  None.  FINDINGS: There is no evidence of fracture, dislocation, or joint effusion. There is no evidence of arthropathy or other focal bone abnormality. Soft tissues are unremarkable.   IMPRESSION: Negative.   Electronically Signed   By: Marin Olp M.D.   On: 12/31/2013 13:39     Imaging Review No results found.   EKG Interpretation None      MDM   Final diagnoses:  Anxiety  Medication reaction, initial encounter  UTI (lower urinary tract infection)    Patient presents with fatigue and muscle aches. She's otherwise well-appearing. She's never been ambulate without problem. Her labs are unremarkable other than her potassium is low. She was given a dose of K-Dur in the ED. She says evidence of urinary tract infection and was started on Cipro for this. She does seem to have a component of depression but she has no suicidal or homicidal ideations. I offered her to see a psychiatrist here to recommend medication adjustments but she says that she'll just call her psychiatrist. She started talking her psychiatrist about the Effexor and its side effects and at this point her psychiatrist did not want to switch her to something else. I recommended discussing not begin with her psychiatrist. I advised to return here for symptoms worsen.    Malvin Johns, MD 01/11/14 [place], MD 01/11/14 [phone]

## 2014-01-11 NOTE — ED Notes (Addendum)
Patient still is unable to use the restroom- patient states "Unless I have a bunch of water, I won't be able to go." RN Renita advised

## 2014-01-11 NOTE — Discharge Instructions (Signed)

## 2014-01-11 NOTE — Telephone Encounter (Signed)
Pt. Was seen by provider on 12/30/13, according to patient medication prescribed has made her feel worse (loss of appetite, in bed the past 3 days)...Marland KitchenMarland Kitchenplease call patient if medication can be changed

## 2014-01-11 NOTE — ED Notes (Signed)
Patient in restroom attempting to give specimen

## 2014-01-12 ENCOUNTER — Telehealth: Payer: Self-pay | Admitting: Emergency Medicine

## 2014-01-12 NOTE — Telephone Encounter (Signed)
Left message for pt to call when message received 

## 2014-01-26 ENCOUNTER — Telehealth: Payer: Self-pay | Admitting: Internal Medicine

## 2014-01-26 ENCOUNTER — Ambulatory Visit: Payer: PRIVATE HEALTH INSURANCE | Admitting: Sports Medicine

## 2014-01-26 NOTE — Telephone Encounter (Signed)
Pt has come in today to ask if she can receive an order for an MRI; pt was scheduled to have an MRI in the past but could not keep appt due to dropped insurance; please f/u with pt at your earliest convenience to discuss nature of this request; pt prefers to keep concerns confidential; [phone]

## 2014-01-27 ENCOUNTER — Ambulatory Visit: Payer: Self-pay | Admitting: Sports Medicine

## 2014-01-27 ENCOUNTER — Telehealth: Payer: Self-pay

## 2014-01-27 NOTE — Telephone Encounter (Signed)
Patient already had orders placed in Epic from March Appointment was made for MRI for July 29 at [facility] Patient is aware of this appointment

## 2014-02-08 ENCOUNTER — Ambulatory Visit: Payer: PRIVATE HEALTH INSURANCE | Admitting: Sports Medicine

## 2014-02-17 ENCOUNTER — Ambulatory Visit (HOSPITAL_COMMUNITY): Payer: Self-pay

## 2014-03-05 ENCOUNTER — Telehealth: Payer: Self-pay | Admitting: Neurology

## 2014-03-05 DIAGNOSIS — R202 Paresthesia of skin: Secondary | ICD-10-CM

## 2014-03-05 DIAGNOSIS — R51 Headache: Secondary | ICD-10-CM

## 2014-03-05 DIAGNOSIS — R42 Dizziness and giddiness: Secondary | ICD-10-CM

## 2014-03-05 NOTE — Telephone Encounter (Signed)
Re-entered MRI and MRA orders

## 2014-03-12 ENCOUNTER — Encounter (HOSPITAL_COMMUNITY): Payer: Self-pay | Admitting: Emergency Medicine

## 2014-03-12 ENCOUNTER — Emergency Department (INDEPENDENT_AMBULATORY_CARE_PROVIDER_SITE_OTHER)
Admission: EM | Admit: 2014-03-12 | Discharge: 2014-03-12 | Disposition: A | Discharge status: Home / Self Care | Payer: PRIVATE HEALTH INSURANCE | Source: Home / Self Care | Attending: Family Medicine | Admitting: Family Medicine

## 2014-03-12 DIAGNOSIS — K219 Gastro-esophageal reflux disease without esophagitis: Secondary | ICD-10-CM

## 2014-03-12 DIAGNOSIS — R300 Dysuria: Secondary | ICD-10-CM

## 2014-03-12 DIAGNOSIS — IMO0001 Reserved for inherently not codable concepts without codable children: Secondary | ICD-10-CM

## 2014-03-12 LAB — POCT URINALYSIS DIP (DEVICE)
BILIRUBIN URINE: NEGATIVE
GLUCOSE, UA: NEGATIVE mg/dL
Hgb urine dipstick: NEGATIVE
KETONES UR: NEGATIVE mg/dL
Leukocytes, UA: NEGATIVE
Nitrite: NEGATIVE
Protein, ur: NEGATIVE mg/dL
SPECIFIC GRAVITY, URINE: 1.025 (ref 1.005–1.030)
Urobilinogen, UA: 0.2 mg/dL (ref 0.0–1.0)
pH: 5.5 (ref 5.0–8.0)

## 2014-03-12 LAB — POCT PREGNANCY, URINE: PREG TEST UR: NEGATIVE

## 2014-03-12 MED ORDER — OMEPRAZOLE 40 MG PO CPDR: 40.0000 mg | DELAYED_RELEASE_CAPSULE | Freq: Every day | ORAL | Status: DC

## 2014-03-12 MED ORDER — CEPHALEXIN 500 MG PO CAPS: 500.0000 mg | ORAL_CAPSULE | Freq: Two times a day (BID) | ORAL | Status: DC

## 2014-03-12 NOTE — ED Notes (Signed)
C/o poss UTI onset 1 week Sx include: urinary freq/urg, dysuria, back pain, dizziness, nauseas Denies f/v/d, gyn sx Alert, no signs of acute distress.

## 2014-03-12 NOTE — ED Provider Notes (Signed)
Breanna Thomas is a 33 y.o. female who presents to Urgent Care today for urinary tract infection symptoms and heartburn.  1) patient has had a greater than one week history of urinary frequency urgency and burning. Her symptoms are consistent with previous episodes of urinary tract infection. She denies any vaginal discharge. She denies any significant fevers chills nausea vomiting or diarrhea. She additionally notes mild low back pain. She has not tried any medications for her symptoms yet.  2) reflux. Patient has had a 2 to three-month history of burning in her throat associated with regurgitation. She's been in the emergency room for this issue several times. She's tried Zantac which helps. No exertional chest pain palpitations or shortness of breath.   Past Medical History  Diagnosis Date  . Anemia   . Vertigo   . Anxiety    History  Substance Use Topics  . Smoking status: Never Smoker   . Smokeless tobacco: Never Used  . Alcohol Use: No     Comment: social   ROS as above Medications: No current facility-administered medications for this encounter.   Current Outpatient Prescriptions  Medication Sig Dispense Refill  . Sertraline HCl (ZOLOFT PO) Take by mouth.      . cephALEXin (KEFLEX) 500 MG capsule Take 1 capsule (500 mg total) by mouth 2 (two) times daily.  14 capsule  0  . ciprofloxacin (CIPRO) 500 MG tablet Take 1 tablet (500 mg total) by mouth 2 (two) times daily. One po bid x 7 days  14 tablet  0  . etonogestrel (IMPLANON) 68 MG IMPL implant Inject 1 each into the skin once.      Marland Kitchen LORazepam (ATIVAN) 1 MG tablet Take 1 mg by mouth every 8 (eight) hours as needed for anxiety.      Marland Kitchen omeprazole (PRILOSEC) 40 MG capsule Take 1 capsule (40 mg total) by mouth daily.  30 capsule  1  . traZODone (DESYREL) 50 MG tablet Take 50 mg by mouth at bedtime.      Marland Kitchen venlafaxine (EFFEXOR) 75 MG tablet Take 75 mg by mouth 2 (two) times daily.      . Vitamin D, Ergocalciferol, (DRISDOL)  50000 UNITS CAPS capsule Take 1 capsule (50,000 Units total) by mouth every 7 (seven) days.  12 capsule  0    Exam:  BP 106/77  Pulse 79  Temp(Src) 98.3 F (36.8 C) (Oral)  Resp 16  SpO2 99%  LMP 03/05/2014 Gen: Well NAD HEENT: EOMI,  MMM Lungs: Normal work of breathing. CTABL Heart: RRR no MRG Abd: NABS, Soft. Nondistended, Nontender no CV angle tenderness to percussion Exts: Brisk capillary refill, warm and well perfused.   Results for orders placed during the hospital encounter of 03/12/14 (from the past 24 hour(s))  POCT URINALYSIS DIP (DEVICE)     Status: None   Collection Time    03/12/14  8:42 AM      Result Value Ref Range   Glucose, UA NEGATIVE  NEGATIVE mg/dL   Bilirubin Urine NEGATIVE  NEGATIVE   Ketones, ur NEGATIVE  NEGATIVE mg/dL   Specific Gravity, Urine 1.025  1.005 - 1.030   Hgb urine dipstick NEGATIVE  NEGATIVE   pH 5.5  5.0 - 8.0   Protein, ur NEGATIVE  NEGATIVE mg/dL   Urobilinogen, UA 0.2  0.0 - 1.0 mg/dL   Nitrite NEGATIVE  NEGATIVE   Leukocytes, UA NEGATIVE  NEGATIVE  POCT PREGNANCY, URINE     Status: None   Collection Time  03/12/14  8:45 AM      Result Value Ref Range   Preg Test, Ur NEGATIVE  NEGATIVE   No results found.  Assessment and Plan: 33 y.o. female with  1) dysuria: Urine culture pending. Empiric treatment with Keflex. If not improved followup with primary care provider.  2) reflux: Patient has acid reflux symptoms. Will treat with omeprazole. Followup with primary care provider.  Discussed warning signs or symptoms. Please see discharge instructions. Patient expresses understanding.   This note was created using Systems analyst. Any transcription errors are unintended.    Gregor Hams, MD 03/12/14 [phone]

## 2014-03-12 NOTE — Discharge Instructions (Signed)
Thank you for coming in today. Call or go to the emergency room if you get worse, have trouble breathing, have chest pains, or palpitations.  If your belly pain worsens, or you have high fever, bad vomiting, blood in your stool or black tarry stool go to the Emergency Room.  Follow up with your doctor.   Urinary Tract Infection Urinary tract infections (UTIs) can develop anywhere along your urinary tract. Your urinary tract is your body's drainage system for removing wastes and extra water. Your urinary tract includes two kidneys, two ureters, a bladder, and a urethra. Your kidneys are a pair of bean-shaped organs. Each kidney is about the size of your fist. They are located below your ribs, one on each side of your spine. CAUSES Infections are caused by microbes, which are microscopic organisms, including fungi, viruses, and bacteria. These organisms are so small that they can only be seen through a microscope. Bacteria are the microbes that most commonly cause UTIs. SYMPTOMS  Symptoms of UTIs may vary by age and gender of the patient and by the location of the infection. Symptoms in young women typically include a frequent and intense urge to urinate and a painful, burning feeling in the bladder or urethra during urination. Older women and men are more likely to be tired, shaky, and weak and have muscle aches and abdominal pain. A fever may mean the infection is in your kidneys. Other symptoms of a kidney infection include pain in your back or sides below the ribs, nausea, and vomiting. DIAGNOSIS To diagnose a UTI, your caregiver will ask you about your symptoms. Your caregiver also will ask to provide a urine sample. The urine sample will be tested for bacteria and white blood cells. White blood cells are made by your body to help fight infection. TREATMENT  Typically, UTIs can be treated with medication. Because most UTIs are caused by a bacterial infection, they usually can be treated with the use  of antibiotics. The choice of antibiotic and length of treatment depend on your symptoms and the type of bacteria causing your infection. HOME CARE INSTRUCTIONS  If you were prescribed antibiotics, take them exactly as your caregiver instructs you. Finish the medication even if you feel better after you have only taken some of the medication.  Drink enough water and fluids to keep your urine clear or pale yellow.  Avoid caffeine, tea, and carbonated beverages. They tend to irritate your bladder.  Empty your bladder often. Avoid holding urine for long periods of time.  Empty your bladder before and after sexual intercourse.  After a bowel movement, women should cleanse from front to back. Use each tissue only once. SEEK MEDICAL CARE IF:   You have back pain.  You develop a fever.  Your symptoms do not begin to resolve within 3 days. SEEK IMMEDIATE MEDICAL CARE IF:   You have severe back pain or lower abdominal pain.  You develop chills.  You have nausea or vomiting.  You have continued burning or discomfort with urination. MAKE SURE YOU:   Understand these instructions.  Will watch your condition.  Will get help right away if you are not doing well or get worse. Document Released: 04/18/2005 Document Revised: 01/08/2012 Document Reviewed: 08/17/2011 [facility] Patient Information 2015 [place], [place]. This information is not intended to replace advice given to you by your health care provider. Make sure you discuss any questions you have with your health care provider.   Gastroesophageal Reflux Disease, Adult Gastroesophageal reflux  disease (GERD) happens when acid from your stomach flows up into the esophagus. When acid comes in contact with the esophagus, the acid causes soreness (inflammation) in the esophagus. Over time, GERD may create small holes (ulcers) in the lining of the esophagus. CAUSES   Increased body weight. This puts pressure on the stomach, making acid rise  from the stomach into the esophagus.  Smoking. This increases acid production in the stomach.  Drinking alcohol. This causes decreased pressure in the lower esophageal sphincter (valve or ring of muscle between the esophagus and stomach), allowing acid from the stomach into the esophagus.  Late evening meals and a full stomach. This increases pressure and acid production in the stomach.  A malformed lower esophageal sphincter. Sometimes, no cause is found. SYMPTOMS   Burning pain in the lower part of the mid-chest behind the breastbone and in the mid-stomach area. This may occur twice a week or more often.  Trouble swallowing.  Sore throat.  Dry cough.  Asthma-like symptoms including chest tightness, shortness of breath, or wheezing. DIAGNOSIS  Your caregiver may be able to diagnose GERD based on your symptoms. In some cases, X-rays and other tests may be done to check for complications or to check the condition of your stomach and esophagus. TREATMENT  Your caregiver may recommend over-the-counter or prescription medicines to help decrease acid production. Ask your caregiver before starting or adding any new medicines.  HOME CARE INSTRUCTIONS   Change the factors that you can control. Ask your caregiver for guidance concerning weight loss, quitting smoking, and alcohol consumption.  Avoid foods and drinks that make your symptoms worse, such as:  Caffeine or alcoholic drinks.  Chocolate.  Peppermint or mint flavorings.  Garlic and onions.  Spicy foods.  Citrus fruits, such as oranges, lemons, or limes.  Tomato-based foods such as sauce, chili, salsa, and pizza.  Fried and fatty foods.  Avoid lying down for the 3 hours prior to your bedtime or prior to taking a nap.  Eat small, frequent meals instead of large meals.  Wear loose-fitting clothing. Do not wear anything tight around your waist that causes pressure on your stomach.  Raise the head of your bed 6 to 8  inches with wood blocks to help you sleep. Extra pillows will not help.  Only take over-the-counter or prescription medicines for pain, discomfort, or fever as directed by your caregiver.  Do not take aspirin, ibuprofen, or other nonsteroidal anti-inflammatory drugs (NSAIDs). SEEK IMMEDIATE MEDICAL CARE IF:   You have pain in your arms, neck, jaw, teeth, or back.  Your pain increases or changes in intensity or duration.  You develop nausea, vomiting, or sweating (diaphoresis).  You develop shortness of breath, or you faint.  Your vomit is green, yellow, black, or looks like coffee grounds or blood.  Your stool is red, bloody, or black. These symptoms could be signs of other problems, such as heart disease, gastric bleeding, or esophageal bleeding. MAKE SURE YOU:   Understand these instructions.  Will watch your condition.  Will get help right away if you are not doing well or get worse. Document Released: 04/18/2005 Document Revised: 10/01/2011 Document Reviewed: 01/26/2011 [facility] Patient Information 2015 [place], [place]. This information is not intended to replace advice given to you by your health care provider. Make sure you discuss any questions you have with your health care provider.

## 2014-03-13 LAB — URINE CULTURE: Special Requests: NORMAL

## 2014-03-16 ENCOUNTER — Ambulatory Visit: Payer: Self-pay | Admitting: Internal Medicine

## 2014-03-22 ENCOUNTER — Ambulatory Visit (HOSPITAL_COMMUNITY)
Admission: RE | Admit: 2014-03-22 | Discharge: 2014-03-22 | Disposition: A | Discharge status: Home / Self Care | Payer: Medicaid Other | Source: Ambulatory Visit | Attending: Neurology | Admitting: Neurology

## 2014-03-22 ENCOUNTER — Ambulatory Visit (HOSPITAL_COMMUNITY): Payer: Medicaid Other

## 2014-03-22 DIAGNOSIS — R42 Dizziness and giddiness: Secondary | ICD-10-CM | POA: Diagnosis present

## 2014-03-22 DIAGNOSIS — R209 Unspecified disturbances of skin sensation: Secondary | ICD-10-CM | POA: Insufficient documentation

## 2014-03-22 DIAGNOSIS — R202 Paresthesia of skin: Secondary | ICD-10-CM

## 2014-03-22 DIAGNOSIS — R51 Headache: Secondary | ICD-10-CM | POA: Diagnosis not present

## 2014-03-23 NOTE — Progress Notes (Signed)
Quick Note:  Please call and advise the patient that the recent scans we did were within normal limits. We did a brain MRI and MRA, which showed normal findings. In particular, there were no acute findings, such as a stroke, or mass or blood products and no evidence of tightening of her brain blood vessels or any significant hardening of the arteries. No further action is required on this test at this time. Please remind patient to keep any upcoming appointments or tests and to call us with any interim questions, concerns, problems or updates. Thanks,  Star Age, MD, PhD   ______

## 2014-03-24 NOTE — Progress Notes (Signed)
Quick Note:  Shared MR Brain results with patient ,verbalized understanding ______

## 2014-04-05 ENCOUNTER — Ambulatory Visit: Payer: Self-pay | Admitting: Internal Medicine

## 2014-04-06 ENCOUNTER — Other Ambulatory Visit: Payer: Self-pay | Admitting: Internal Medicine

## 2014-05-02 ENCOUNTER — Encounter (HOSPITAL_COMMUNITY): Payer: Self-pay | Admitting: Emergency Medicine

## 2014-05-02 ENCOUNTER — Emergency Department (HOSPITAL_COMMUNITY)
Admission: EM | Admit: 2014-05-02 | Discharge: 2014-05-02 | Disposition: A | Discharge status: Home / Self Care | Payer: Medicaid Other | Source: Home / Self Care | Attending: Family Medicine | Admitting: Family Medicine

## 2014-05-02 DIAGNOSIS — R202 Paresthesia of skin: Secondary | ICD-10-CM

## 2014-05-02 DIAGNOSIS — W5503XA Scratched by cat, initial encounter: Secondary | ICD-10-CM

## 2014-05-02 DIAGNOSIS — T148 Other injury of unspecified body region: Secondary | ICD-10-CM

## 2014-05-02 MED ORDER — TETANUS-DIPHTH-ACELL PERTUSSIS 5-2.5-18.5 LF-MCG/0.5 IM SUSP
0.5000 mL | Freq: Once | INTRAMUSCULAR | Status: AC
  Administered 2014-05-02: 0.5 mL via INTRAMUSCULAR

## 2014-05-02 MED ORDER — AMOXICILLIN-POT CLAVULANATE 875-125 MG PO TABS: 1.0000 | ORAL_TABLET | Freq: Two times a day (BID) | ORAL | Status: DC

## 2014-05-02 MED ORDER — TETANUS-DIPHTH-ACELL PERTUSSIS 5-2.5-18.5 LF-MCG/0.5 IM SUSP
INTRAMUSCULAR | Status: AC
  Filled 2014-05-02: qty 0.5

## 2014-05-02 NOTE — Discharge Instructions (Signed)
Thank you for coming in today. Take Augmentin twice daily for one week. Take with probiotics to prevent diarrhea. Followup with Dr. Ermalene Postin at [facility].  Come back as needed.   Cat Scratch Disease Cats often injure people by scratching or biting. This site of injury can become infected with a particular germ or bacteria present in the mouth of or on the cat. This germ is called Bartonella henselae. This infection is identified by the common name cat scratch disease (CSD).  SYMPTOMS  A red and sore pimple or bump, with or without pus, on the skin where the cat scratched or bit. The pimple or sore may be present for as long as three weeks after the scratch or bite occurred.  One or more enlarged lymph glands located toward the center of the body from where the injury occurred.  Less common symptoms include low-grade fever, tiredness, fatigue, headache and/or sore throat. DIAGNOSIS  The diagnosis is typically made by your caregiver who notes the history of a scratch or bite from a cat, and finds the skin sore and swollen lymph glands in the described area.  Culture of any drainage or pus from the injury site, or a needle aspiration or piece of tissue (biopsy) from a swollen lymph gland may also be done to confirm the diagnosis and assure that a different infection or disease is not causing your illness. Rare but serious complications may occur, they include:  Parinaud's syndrome - fever, swollen lymph glands and inflammation of the eye (conjunctivitis).  Infection of the brain (encephalitis).  Infection of the nerve of the eye (neuroretinitis).  Infection of the bone (osteomyelitis). TREATMENT  Usually treatment is not necessary or helpful, especially if you have a normal immune system. When infection is very severe, it may be treated with a medicine that kills the bacteria (antibiotic).  People with immune system problems (such as having AIDS or an organ transplant, or  being on steroids or other immune modifying drugs) should be treated with antibiotics. HOME CARE INSTRUCTIONS   Avoid injury while playing with cats.  Wash well after playing with cats.  Do not let your cat lick sores on your body.  Do not let your cat roam around outside of your house.  Keep the area of the cat scratch clean. Wash it with soap and water or apply an antiseptic solution such as povidone iodine.  You should get a tetanus shot if you have not had one in the past 5 or 10 years. If you receive one, your arm may get swollen and red and warm to the touch at the shot site. This is a common response to the medication in the shot. If you did not receive a tetanus shot here because you did not recall when your last one was given, make sure to check with your caregiver's office and determine if one is needed. Generally, for a "dirty" wound, you should receive a tetanus booster if you have not had one in the last five years. If you have a "clean" wound, you should receive a tetanus booster if you have not had one in the last ten years. SEEK IMMEDIATE MEDICAL CARE IF:   You have worsening signs of infection, such as more redness, increased pain, red streaking or pus coming from the wound, or warmth or swelling around the area of the scratch.  You develop worsening swollen lymph glands.  You develop abdominal pain, have problems with your vision or develop a skin rash.  You have a fever.  You become more tired or dizzy, or have a worsening headache.  You develop inflammation of your eye or have increasing vision problems.  You have pain in one of your bones.  You develop a stiff neck.  You pass out. MAKE SURE YOU:   Understand these instructions.  Will watch your condition.  Will get help right away if you are not doing well or get worse. Document Released: 07/06/2000 Document Revised: 10/01/2011 Document Reviewed: 08/18/2008 [facility] Patient Information 2015 [place],  [place]. This information is not intended to replace advice given to you by your health care provider. Make sure you discuss any questions you have with your health care provider.  Paresthesia Paresthesia is an abnormal burning or prickling sensation. This sensation is generally felt in the hands, arms, legs, or feet. However, it may occur in any part of the body. It is usually not painful. The feeling may be described as:  Tingling or numbness.  "Pins and needles."  Skin crawling.  Buzzing.  Limbs "falling asleep."  Itching. Most people experience temporary (transient) paresthesia at some time in their lives. CAUSES  Paresthesia may occur when you breathe too quickly (hyperventilation). It can also occur without any apparent cause. Commonly, paresthesia occurs when pressure is placed on a nerve. The feeling quickly goes away once the pressure is removed. For some people, however, paresthesia is a long-lasting (chronic) condition caused by an underlying disorder. The underlying disorder may be:  A traumatic, direct injury to nerves. Examples include a:  Broken (fractured) neck.  Fractured skull.  A disorder affecting the brain and spinal cord (central nervous system). Examples include:  Transverse myelitis.  Encephalitis.  Transient ischemic attack.  Multiple sclerosis.  Stroke.  Tumor or blood vessel problems, such as an arteriovenous malformation pressing against the brain or spinal cord.  A condition that damages the peripheral nerves (peripheral neuropathy). Peripheral nerves are not part of the brain and spinal cord. These conditions include:  Diabetes.  Peripheral vascular disease.  Nerve entrapment syndromes, such as carpal tunnel syndrome.  Shingles.  Hypothyroidism.  Vitamin B12 deficiencies.  Alcoholism.  Heavy metal poisoning (lead, arsenic).  Rheumatoid arthritis.  Systemic lupus erythematosus. DIAGNOSIS  Your caregiver will attempt to find the  underlying cause of your paresthesia. Your caregiver may:  Take your medical history.  Perform a physical exam.  Order various lab tests.  Order imaging tests. TREATMENT  Treatment for paresthesia depends on the underlying cause. HOME CARE INSTRUCTIONS  Avoid drinking alcohol.  You may consider massage or acupuncture to help relieve your symptoms.  Keep all follow-up appointments as directed by your caregiver. SEEK IMMEDIATE MEDICAL CARE IF:   You feel weak.  You have trouble walking or moving.  You have problems with speech or vision.  You feel confused.  You cannot control your bladder or bowel movements.  You feel numbness after an injury.  You faint.  Your burning or prickling feeling gets worse when walking.  You have pain, cramps, or dizziness.  You develop a rash. MAKE SURE YOU:  Understand these instructions.  Will watch your condition.  Will get help right away if you are not doing well or get worse. Document Released: 06/29/2002 Document Revised: 10/01/2011 Document Reviewed: 03/30/2011 [facility] Patient Information 2015 [place], [place]. This information is not intended to replace advice given to you by your health care provider. Make sure you discuss any questions you have with your health care provider.

## 2014-05-02 NOTE — ED Provider Notes (Signed)
Breanna Thomas is a 33 y.o. female who presents to Urgent Care today for 2 issues. 1) cat scratch: Patient was at the animal shelter today looking 2 adopted that when she was scratched on her chest wall. The cat is up-to-date with rabies vaccine the patient is very anxious that she may contract cat scratch fever. No fevers or chills nausea vomiting or diarrhea. 2) patient incidentally additionally notes a several month history of subjective left arm numbness. She denies any weakness neck pain fevers or chills. She feels well otherwise. No treatment yet.   Past Medical History  Diagnosis Date  . Anemia   . Vertigo   . Anxiety    History  Substance Use Topics  . Smoking status: Never Smoker   . Smokeless tobacco: Never Used  . Alcohol Use: No     Comment: social   ROS as above Medications: No current facility-administered medications for this encounter.   Current Outpatient Prescriptions  Medication Sig Dispense Refill  . Sertraline HCl (ZOLOFT PO) Take by mouth.      Marland Kitchen amoxicillin-clavulanate (AUGMENTIN) 875-125 MG per tablet Take 1 tablet by mouth every 12 (twelve) hours.  14 tablet  0  . cephALEXin (KEFLEX) 500 MG capsule Take 1 capsule (500 mg total) by mouth 2 (two) times daily.  14 capsule  0  . ciprofloxacin (CIPRO) 500 MG tablet Take 1 tablet (500 mg total) by mouth 2 (two) times daily. One po bid x 7 days  14 tablet  0  . etonogestrel (IMPLANON) 68 MG IMPL implant Inject 1 each into the skin once.      Marland Kitchen LORazepam (ATIVAN) 1 MG tablet Take 1 mg by mouth every 8 (eight) hours as needed for anxiety.      Marland Kitchen omeprazole (PRILOSEC) 40 MG capsule Take 1 capsule (40 mg total) by mouth daily.  30 capsule  1  . traZODone (DESYREL) 50 MG tablet Take 50 mg by mouth at bedtime.      Marland Kitchen venlafaxine (EFFEXOR) 75 MG tablet Take 75 mg by mouth 2 (two) times daily.      . Vitamin D, Ergocalciferol, (DRISDOL) 50000 UNITS CAPS capsule Take 1 capsule (50,000 Units total) by mouth every 7  (seven) days.  12 capsule  0    Exam:  BP 143/87  Pulse 85  Temp(Src) 98.2 F (36.8 C) (Oral)  SpO2 100% Gen: Well NAD HEENT: EOMI,  MMM Lungs: Normal work of breathing. CTABL Heart: RRR no MRG Abd: NABS, Soft. Nondistended, Nontender Exts: Brisk capillary refill, warm and well perfused.  Chest wall: Tiny mildly erythematous scratch. Nontender. Neck: Nontender to midline. Normal neck range of motion. Upper extremity strength is intact bilaterally. Reflexes are equal and normal bilaterally. Sensation is slightly decreased but the lateral medial aspect of her left upper arm and into her forearm. Capillary refill and pulses are intact distally bilateral upper extremities  No results found for this or any previous visit (from the past 24 hour(s)). No results found.  Assessment and Plan: 33 y.o. female with  1) cat scratch: Prophylactic Augmentin 2) arm numbness: Unclear etiology. Followup with [facility]  Discussed warning signs or symptoms. Please see discharge instructions. Patient expresses understanding.     Gregor Hams, MD 05/02/14 1025

## 2014-05-02 NOTE — ED Notes (Signed)
Reports being scratched by a cat yesterday on upper chest.  Mild numbness at site.

## 2014-05-24 ENCOUNTER — Encounter (HOSPITAL_COMMUNITY): Payer: Self-pay | Admitting: Emergency Medicine

## 2014-08-02 ENCOUNTER — Telehealth: Payer: Self-pay | Admitting: Internal Medicine

## 2014-08-02 ENCOUNTER — Emergency Department (HOSPITAL_COMMUNITY)
Admission: EM | Admit: 2014-08-02 | Discharge: 2014-08-02 | Disposition: A | Discharge status: Home / Self Care | Payer: Medicaid Other | Source: Home / Self Care | Attending: Family Medicine | Admitting: Family Medicine

## 2014-08-02 ENCOUNTER — Encounter (HOSPITAL_COMMUNITY): Payer: Self-pay | Admitting: Emergency Medicine

## 2014-08-02 DIAGNOSIS — F419 Anxiety disorder, unspecified: Secondary | ICD-10-CM | POA: Diagnosis not present

## 2014-08-02 MED ORDER — SERTRALINE HCL 100 MG PO TABS: 100.0000 mg | ORAL_TABLET | Freq: Every day | ORAL | Status: DC

## 2014-08-02 NOTE — Telephone Encounter (Signed)
Patient has come in today to request a refill for medication Sertraline HCl (ZOLOFT PO); Patient was informed that that medication was scripted by another provider; please f/u with patient about this request

## 2014-08-02 NOTE — ED Provider Notes (Addendum)
Breanna Thomas is a 34 y.o. female who presents to Urgent Care today for anxiety and Zoloft refill. Patient takes 100 mg of Zoloft daily for anxiety. She took her last pill today. She was receiving this medication at the [facility] however that doctor is no longer working there. They will not refill the medication. He has an appointment with a new doctor in 1 week. She feels well otherwise. No SI/HI   Past Medical History  Diagnosis Date  . Anemia   . Vertigo   . Anxiety    Past Surgical History  Procedure Laterality Date  . No past surgeries     History  Substance Use Topics  . Smoking status: Never Smoker   . Smokeless tobacco: Never Used  . Alcohol Use: No     Comment: social   ROS as above Medications: No current facility-administered medications for this encounter.   Current Outpatient Prescriptions  Medication Sig Dispense Refill  . etonogestrel (IMPLANON) 68 MG IMPL implant Inject 1 each into the skin once.    Marland Kitchen LORazepam (ATIVAN) 1 MG tablet Take 1 mg by mouth every 8 (eight) hours as needed for anxiety.    Marland Kitchen omeprazole (PRILOSEC) 40 MG capsule Take 1 capsule (40 mg total) by mouth daily. 30 capsule 1  . sertraline (ZOLOFT) 100 MG tablet Take 1 tablet (100 mg total) by mouth daily. 30 tablet 1  . traZODone (DESYREL) 50 MG tablet Take 50 mg by mouth at bedtime.    Marland Kitchen venlafaxine (EFFEXOR) 75 MG tablet Take 75 mg by mouth 2 (two) times daily.    . Vitamin D, Ergocalciferol, (DRISDOL) 50000 UNITS CAPS capsule Take 1 capsule (50,000 Units total) by mouth every 7 (seven) days. 12 capsule 0   No Known Allergies   Exam:  BP 124/84 mmHg  Pulse 90  Temp(Src) 97.5 F (36.4 C) (Oral)  Resp 18  SpO2 97% Gen: Well NAD Psych: Alert and oriented normal affect normal speech and thought process. No SI or HI  No results found for this or any previous visit (from the past 24 hour(s)). No results found.  Assessment and Plan: 34 y.o. female with anxiety. Refill Zoloft follow-up  with PCP  Discussed warning signs or symptoms. Please see discharge instructions. Patient expresses understanding.     Gregor Hams, MD 08/02/14 1827   TDAP given prior to d/c  Gregor Hams, MD 08/24/14 [phone]

## 2014-08-02 NOTE — Telephone Encounter (Signed)
[facility] in the past Advised to call [facility] for refills

## 2014-08-02 NOTE — Discharge Instructions (Signed)
Thank you for coming in today. Continue Zoloft.  Follow up with your doctor.  Return as needed.   Go to the ER if your get worse.

## 2014-08-02 NOTE — ED Notes (Signed)
Pt states that she needs her zoloft refilled

## 2014-10-02 ENCOUNTER — Encounter (HOSPITAL_COMMUNITY): Payer: Self-pay | Admitting: *Deleted

## 2014-10-02 ENCOUNTER — Emergency Department (HOSPITAL_COMMUNITY)
Admission: EM | Admit: 2014-10-02 | Discharge: 2014-10-02 | Disposition: A | Discharge status: Home / Self Care | Payer: Medicaid Other | Source: Home / Self Care | Attending: Family Medicine | Admitting: Family Medicine

## 2014-10-02 DIAGNOSIS — R002 Palpitations: Secondary | ICD-10-CM | POA: Diagnosis not present

## 2014-10-02 DIAGNOSIS — F419 Anxiety disorder, unspecified: Secondary | ICD-10-CM | POA: Diagnosis not present

## 2014-10-02 MED ORDER — SERTRALINE HCL 100 MG PO TABS: 100.0000 mg | ORAL_TABLET | Freq: Every day | ORAL | Status: DC

## 2014-10-02 NOTE — ED Provider Notes (Signed)
Breanna Thomas is a 34 y.o. female who presents to Urgent Care today for anxiety and palpitations. 1) patient has anxiety that is well controlled with sertraline. She took her last dose of this medicine yesterday and has run out. She has an appointment on March 18 with her primary care provider. She feels well otherwise from an anxiety standpoint.  2) additionally patient notes a 2 week history of feeling a cold sensation in her chest. This is nonexertional and comes and goes within seconds. She denies any pain or palpitations shortness of breath lightheadedness or dizziness.   Past Medical History  Diagnosis Date  . Anemia   . Vertigo   . Anxiety    Past Surgical History  Procedure Laterality Date  . No past surgeries     History  Substance Use Topics  . Smoking status: Never Smoker   . Smokeless tobacco: Never Used  . Alcohol Use: No     Comment: social   ROS as above Medications: No current facility-administered medications for this encounter.   Current Outpatient Prescriptions  Medication Sig Dispense Refill  . etonogestrel (IMPLANON) 68 MG IMPL implant Inject 1 each into the skin once.    Marland Kitchen LORazepam (ATIVAN) 1 MG tablet Take 1 mg by mouth every 8 (eight) hours as needed for anxiety.    Marland Kitchen omeprazole (PRILOSEC) 40 MG capsule Take 1 capsule (40 mg total) by mouth daily. 30 capsule 1  . sertraline (ZOLOFT) 100 MG tablet Take 1 tablet (100 mg total) by mouth daily. 30 tablet 0  . traZODone (DESYREL) 50 MG tablet Take 50 mg by mouth at bedtime.    Marland Kitchen venlafaxine (EFFEXOR) 75 MG tablet Take 75 mg by mouth 2 (two) times daily.    . Vitamin D, Ergocalciferol, (DRISDOL) 50000 UNITS CAPS capsule Take 1 capsule (50,000 Units total) by mouth every 7 (seven) days. 12 capsule 0   No Known Allergies   Exam:  BP 118/83 mmHg  Pulse 73  Temp(Src) 98.8 F (37.1 C) (Oral)  Resp 16  SpO2 100% Gen: Well NAD HEENT: EOMI,  MMM Lungs: Normal work of breathing. CTABL Heart: RRR no  MRG Abd: NABS, Soft. Nondistended, Nontender Exts: Brisk capillary refill, warm and well perfused.  Psych: Alert and oriented normal affect. Process no delusions or hallucinations expressed.  ED ECG REPORT   Date: 10/02/2014  EKG Time: 1:30 PM  Rate: 77 bpm  Rhythm: normal sinus rhythm,  normal EKG, normal sinus rhythm, unchanged from previous tracings  Axis: normal  Intervals:QTc 454   ST&T Change: none  Narrative Interpretation: Normal. Unchanged   No results found for this or any previous visit (from the past 24 hour(s)). No results found.  Assessment and Plan: 34 y.o. female with  1) Anxiety. Refill Zoloft. F/u with PCP 2) Cold Sensation in chest: Unclear etiology. EKG unchanged. F/u with PCP.   Discussed warning signs or symptoms. Please see discharge instructions. Patient expresses understanding.     Gregor Hams, MD 10/02/14 [phone]

## 2014-10-02 NOTE — ED Notes (Addendum)
Pt  Here  For  medication  Refill   Pt   Reports  Symptoms  Of  Anxiety            C/o     Coldness  In   Chest

## 2014-10-02 NOTE — Discharge Instructions (Signed)
Thank you for coming in today. Continue Zoloft. Follow-up with primary care provider. We will not refill this medication again. Call or go to the emergency room if you get worse, have trouble breathing, have chest pains, or palpitations.   Generalized Anxiety Disorder Generalized anxiety disorder (GAD) is a mental disorder. It interferes with life functions, including relationships, work, and school. GAD is different from normal anxiety, which everyone experiences at some point in their lives in response to specific life events and activities. Normal anxiety actually helps [place] prepare for and get through these life events and activities. Normal anxiety goes away after the event or activity is over.  GAD causes anxiety that is not necessarily related to specific events or activities. It also causes excess anxiety in proportion to specific events or activities. The anxiety associated with GAD is also difficult to control. GAD can vary from mild to severe. People with severe GAD can have intense waves of anxiety with physical symptoms (panic attacks).  SYMPTOMS The anxiety and worry associated with GAD are difficult to control. This anxiety and worry are related to many life events and activities and also occur more days than not for 6 months or longer. People with GAD also have three or more of the following symptoms (one or more in children):  Restlessness.   Fatigue.  Difficulty concentrating.   Irritability.  Muscle tension.  Difficulty sleeping or unsatisfying sleep. DIAGNOSIS GAD is diagnosed through an assessment by your health care provider. Your health care provider will ask you questions aboutyour mood,physical symptoms, and events in your life. Your health care provider may ask you about your medical history and use of alcohol or drugs, including prescription medicines. Your health care provider may also do a physical exam and blood tests. Certain medical conditions and the use of  certain substances can cause symptoms similar to those associated with GAD. Your health care provider may refer you to a mental health specialist for further evaluation. TREATMENT The following therapies are usually used to treat GAD:   Medication. Antidepressant medication usually is prescribed for long-term daily control. Antianxiety medicines may be added in severe cases, especially when panic attacks occur.   Talk therapy (psychotherapy). Certain types of talk therapy can be helpful in treating GAD by providing support, education, and guidance. A form of talk therapy called cognitive behavioral therapy can teach you healthy ways to think about and react to daily life events and activities.  Stress managementtechniques. These include yoga, meditation, and exercise and can be very helpful when they are practiced regularly. A mental health specialist can help determine which treatment is best for you. Some people see improvement with one therapy. However, other people require a combination of therapies. Document Released: 11/03/2012 Document Revised: 11/23/2013 Document Reviewed: 11/03/2012 [facility] Patient Information 2015 [place], [place]. This information is not intended to replace advice given to you by your health care provider. Make sure you discuss any questions you have with your health care provider.   Palpitations A palpitation is the feeling that your heartbeat is irregular or is faster than normal. It may feel like your heart is fluttering or skipping a beat. Palpitations are usually not a serious problem. However, in some cases, you may need further medical evaluation. CAUSES  Palpitations can be caused by:  Smoking.  Caffeine or other stimulants, such as diet pills or energy drinks.  Alcohol.  Stress and anxiety.  Strenuous physical activity.  Fatigue.  Certain medicines.  Heart disease, especially if you  have a history of irregular heart rhythms (arrhythmias), such  as atrial fibrillation, atrial flutter, or supraventricular tachycardia.  An improperly working pacemaker or defibrillator. DIAGNOSIS  To find the cause of your palpitations, your health care provider will take your medical history and perform a physical exam. Your health care provider may also have you take a test called an ambulatory electrocardiogram (ECG). An ECG records your heartbeat patterns over a 24-hour period. You may also have other tests, such as:  Transthoracic echocardiogram (TTE). During echocardiography, sound waves are used to evaluate how blood flows through your heart.  Transesophageal echocardiogram (TEE).  Cardiac monitoring. This allows your health care provider to monitor your heart rate and rhythm in real time.  Holter monitor. This is a portable device that records your heartbeat and can help diagnose heart arrhythmias. It allows your health care provider to track your heart activity for several days, if needed.  Stress tests by exercise or by giving medicine that makes the heart beat faster. TREATMENT  Treatment of palpitations depends on the cause of your symptoms and can vary greatly. Most cases of palpitations do not require any treatment other than time, relaxation, and monitoring your symptoms. Other causes, such as atrial fibrillation, atrial flutter, or supraventricular tachycardia, usually require further treatment. HOME CARE INSTRUCTIONS   Avoid:  Caffeinated coffee, tea, soft drinks, diet pills, and energy drinks.  Chocolate.  Alcohol.  Stop smoking if you smoke.  Reduce your stress and anxiety. Things that can help you relax include:  A method of controlling things in your body, such as your heartbeats, with your mind (biofeedback).  Yoga.  Meditation.  Physical activity such as swimming, jogging, or walking.  Get plenty of rest and sleep. SEEK MEDICAL CARE IF:   You continue to have a fast or irregular heartbeat beyond 24 hours.  Your  palpitations occur more often. SEEK IMMEDIATE MEDICAL CARE IF:  You have chest pain or shortness of breath.  You have a severe headache.  You feel dizzy or you faint. MAKE SURE YOU:  Understand these instructions.  Will watch your condition.  Will get help right away if you are not doing well or get worse. Document Released: 07/06/2000 Document Revised: 07/14/2013 Document Reviewed: 09/07/2011 [facility] Patient Information 2015 [place], [place]. This information is not intended to replace advice given to you by your health care provider. Make sure you discuss any questions you have with your health care provider.

## 2015-01-22 ENCOUNTER — Encounter (HOSPITAL_COMMUNITY): Payer: Self-pay | Admitting: Emergency Medicine

## 2015-01-22 ENCOUNTER — Emergency Department (HOSPITAL_COMMUNITY)
Admission: EM | Admit: 2015-01-22 | Discharge: 2015-01-22 | Disposition: A | Discharge status: Home / Self Care | Payer: Medicaid Other | Source: Home / Self Care | Attending: Emergency Medicine | Admitting: Emergency Medicine

## 2015-01-22 DIAGNOSIS — M7080 Other soft tissue disorders related to use, overuse and pressure of unspecified site: Secondary | ICD-10-CM

## 2015-01-22 DIAGNOSIS — T148XXA Other injury of unspecified body region, initial encounter: Secondary | ICD-10-CM

## 2015-01-22 DIAGNOSIS — T148 Other injury of unspecified body region: Secondary | ICD-10-CM

## 2015-01-22 DIAGNOSIS — M779 Enthesopathy, unspecified: Secondary | ICD-10-CM

## 2015-01-22 DIAGNOSIS — S76911A Strain of unspecified muscles, fascia and tendons at thigh level, right thigh, initial encounter: Secondary | ICD-10-CM | POA: Diagnosis not present

## 2015-01-22 HISTORY — DX: Panic disorder (episodic paroxysmal anxiety): F41.0

## 2015-01-22 NOTE — ED Notes (Signed)
C/o pain behind both knees.  Today has additional pain into right thigh, including groin.  Patient cannot describe this pain, repeatedly calls this pain "weird"  Reports right leg feels weak.  No back pain.  No known injury

## 2015-01-22 NOTE — ED Provider Notes (Signed)
CSN: [id number]     Arrival date & time 01/22/15  1524 History   First MD Initiated Contact with Patient 01/22/15 1818     Chief Complaint  Patient presents with  . 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 It started 2-3 days ago behind the knees and the right thigh today. She works in [place] and she is on her feet for several hours during the day. She denies any trauma. No fall or twisting of the hip or knees. She occasionally feels as though she has edema in her lower extremities.    Past Medical History  Diagnosis Date  . Anemia   . Vertigo   . Anxiety   . Anxiety   . Panic attacks    Past Surgical History  Procedure Laterality Date  . No past surgeries     Family History  Problem Relation Age of Onset  . Seizures    . Heart failure    . Diabetes Mother   . Hypertension Mother   . Heart disease Father   . Seizures Father   . Diabetes Sister    History  Substance Use Topics  . Smoking status: Never Smoker   . Smokeless tobacco: Never Used  . Alcohol Use: No     Comment: social   OB History    Gravida Para Term Preterm AB TAB SAB Ectopic Multiple Living   3 3             Review of Systems  Constitutional: Negative for fever, activity change and fatigue.  Respiratory: Negative.   Musculoskeletal: Positive for myalgias. Negative for joint swelling and neck pain.  Skin: Negative.   Neurological: Negative for dizziness, seizures and headaches.  All other systems reviewed and are negative.   Allergies  Review of patient's allergies indicates no known allergies.  Home Medications   Prior to Admission medications   Medication Sig Start Date End Date Taking? Authorizing Provider  etonogestrel  (IMPLANON) 68 MG IMPL implant Inject 1 each into the skin once.    Historical Provider, MD  LORazepam (ATIVAN) 1 MG tablet Take 1 mg by mouth every 8 (eight) hours as needed for anxiety.    Historical Provider, MD  omeprazole (PRILOSEC) 40 MG capsule Take 1 capsule (40 mg total) by mouth daily. 03/12/14   Gregor Hams, MD  sertraline (ZOLOFT) 100 MG tablet Take 1 tablet (100 mg total) by mouth daily. 10/02/14   Gregor Hams, MD  traZODone (DESYREL) 50 MG tablet Take 50 mg by mouth at bedtime.    Historical Provider, MD  venlafaxine (EFFEXOR) 75 MG tablet Take 75 mg by mouth 2 (two) times daily.    Historical Provider, MD  Vitamin D, Ergocalciferol, (DRISDOL) 50000 UNITS CAPS capsule Take 1 capsule (50,000 Units total) by mouth every 7 (seven) days. 12/16/13   Lorayne Marek, MD   There were no vitals taken for this visit. Physical Exam  Constitutional: She is oriented to person, place, and time. She appears well-developed and well-nourished. No distress.  Musculoskeletal:  Lower extremities with tenderness to the tendons in the popliteal fossa bilaterally. Mild tenderness to the proximal calf. There is no swelling to the lower extremities or calf. Distal neurovascular is intact. Pedal pulses are 2+. Normal warmth and color of the lower extremities.  No tenderness to the knee joint. Full extension to 180 and full flexion without limitation. No pitting edema. After standing for prolonged period of time she believes she has some swelling in her lower extremities. She may have 1+ nonpitting edema nail but difficult to tell due to obesity.  Neurological: She is alert and oriented to person, place, and time. She exhibits normal muscle tone.  Skin: Skin is warm and dry.  Psychiatric: She has a normal mood and affect.  Nursing note and vitals reviewed.   ED Course  Procedures (including critical care time) Labs Review Labs Reviewed - No data to display  Imaging Review No results found.   MDM   1.  Tendinitis   2. Muscle strain of thigh, right, initial encounter   3. Repetitive motion injury    Recommend placing cold or ice packs behind the knees often known 3-4 times a day. Perform stretches of the calves and hamstrings as demonstrated. Wear compression stockings when working and for prolonged standing. Follow-up with your primary care doctor next week. No suspicion for DVT. Physical exam does not reveal evidence of Baker cyst. Circulation is intact.  Janne Napoleon, NP 01/22/15 [phone]

## 2015-01-22 NOTE — Discharge Instructions (Signed)
Muscle Strain Recommend placing cold or ice packs behind the knees often known 3-4 times a day. Perform stretches of the calves and hamstrings as demonstrated. Wear compression stockings when working and for prolonged standing. Follow-up with your primary care doctor next week. A muscle strain is an injury that occurs when a muscle is stretched beyond its normal length. Usually a small number of muscle fibers are torn when this happens. Muscle strain is rated in degrees. First-degree strains have the least amount of muscle fiber tearing and pain. Second-degree and third-degree strains have increasingly more tearing and pain.  Usually, recovery from muscle strain takes 1-2 weeks. Complete healing takes 5-6 weeks.  CAUSES  Muscle strain happens when a sudden, violent force placed on a muscle stretches it too far. This may occur with lifting, sports, or a fall.  RISK FACTORS Muscle strain is especially common in athletes.  SIGNS AND SYMPTOMS At the site of the muscle strain, there may be:  Pain.  Bruising.  Swelling.  Difficulty using the muscle due to pain or lack of normal function. DIAGNOSIS  Your health care provider will perform a physical exam and ask about your medical history. TREATMENT  Often, the best treatment for a muscle strain is resting, icing, and applying cold compresses to the injured area.  HOME CARE INSTRUCTIONS   Use the PRICE method of treatment to promote muscle healing during the first 2-3 days after your injury. The PRICE method involves:  Protecting the muscle from being injured again.  Restricting your activity and resting the injured body part.  Icing your injury. To do this, put ice in a plastic bag. Place a towel between your skin and the bag. Then, apply the ice and leave it on from 15-20 minutes each hour. After the third day, switch to moist heat packs.  Apply compression to the injured area with a splint or elastic bandage. Be careful not to wrap it too  tightly. This may interfere with blood circulation or increase swelling.  Elevate the injured body part above the level of your heart as often as you can.  Only take over-the-counter or prescription medicines for pain, discomfort, or fever as directed by your health care provider.  Warming up prior to exercise helps to prevent future muscle strains. SEEK MEDICAL CARE IF:   You have increasing pain or swelling in the injured area.  You have numbness, tingling, or a significant loss of strength in the injured area. MAKE SURE YOU:   Understand these instructions.  Will watch your condition.  Will get help right away if you are not doing well or get worse. Document Released: 07/09/2005 Document Revised: 04/29/2013 Document Reviewed: 02/05/2013 [facility] Patient Information 2015 [place], [place]. This information is not intended to replace advice given to you by your health care provider. Make sure you discuss any questions you have with your health care provider.  Repetitive Strain Injuries Repetitive strain injuries (RSIs) result from overuse or misuse of soft tissues including muscles, tendons, or nerves. Tendons are the cord-like structures that attach muscles to bones. RSIs can affect almost any part of the body. However, RSIs are most common in the arms (thumbs, wrists, elbows, shoulders) and legs (ankles, knees). Common medical conditions that are often caused by repetitive strain include carpal tunnel syndrome, tennis or golfer's elbow, bursitis, and tendonitis. If RSIs are treated early, and therepeated activity is reduced or removed, the severity and length of your problems can usually be reduced. RSIs are also called cumulative  trauma disorders (CTD).  CAUSES  Many RSIs occur due to repeating the same activity at work over weeks or months without sufficient rest, such as prolonged typing. RSIs also commonly occur when a hobby or sport is done repeatedly without sufficient rest. RSIs can  also occur due to repeated strain or stress on a body part in someone who has one or more risk factors for RSIs. RISK FACTORS Workplace risk factors  Frequent computer use, especially if your workstation is not adjusted for your body type.  Infrequent rest breaks.  Working in a high-pressure environment.  Working at a [employer].  Repeating the same motion, such as frequent typing.  Working in an awkward position or holding the same position for a long time.  Forceful movements such as lifting, pulling, or pushing.  Vibration caused by using power tools.  Working in cold temperatures.  Job stress. Personal risk factors  Poor posture.  Being loose-jointed.  Not exercising regularly.  Being overweight.  Arthritis, diabetes, thyroid problems, or other long-term (chronic)medical conditions.  Vitamin deficiencies.  Keeping your fingernails long.  An unhealthy, stressful, or inactive lifestyle.  Not sleeping well. SYMPTOMS  Symptoms often begin at work but become more noticeable after the repeated stress has ended. For example, you may develop fatigue or soreness in your wrist while typingat work, and at night you may develop numbness and tingling in your fingers. Common symptoms include:   Burning, shooting, or aching pain, especially in the fingers, palms, wrists, forearms, or shoulders.  Tenderness.  Swelling.  Tingling, numbness, or loss of feeling.  Pain with certain activities, such as turning a doorknob or reaching above your head.  Weakness, heaviness, or loss of coordination in yourhand.  Muscle spasms or tightness. In some cases, symptoms can become so intense that it is difficult to perform everyday tasks. Symptoms that do not improve with rest may indicate a more serious condition.  DIAGNOSIS  Your caregiver may determine the type ofRSI you have based on your medical evaluation and a description of your activities.  TREATMENT  Treatment depends  on the severity and type of RSI you have. Your caregiver may recommend rest for the affected body part, medicines, and physical or occupational therapy to reduce pain, swelling, and soreness. Discuss the activities you do repeatedly with your caregiver. Your caregiver can help you decide whether you need to change your activities. An RSI may take months or years to heal, especially if the affected body part gets insufficient rest. In some cases, such as severe carpal tunnel syndrome, surgery may be recommended. PREVENTION  Talk with your supervisor to make sure you have the proper equipment for your work station.  Maintain good posture at your desk or work station with:  Feet flat on the floor.  Knees directly over the feet, bent at a right angle.  Lower back supported by your chair or a cushion in the curve of your lower back.  Shoulders and arms relaxed and at your sides.  Neck relaxed and not bent forwards or backwards.  Your desk and computer workstation properly adjusted to your body type.  Your chair adjusted so there is no excess pressure on the back of your thighs.  The keyboard resting above your thighs. You should be able to reach the keys with your elbows at your side, bent at a right angle. Your arms should be supported on forearm rests, with your forearms parallel to the ground.  The computer mouse within easy reach.  The monitor directly in front of you, so that your eyes are aligned with the top of the screen. The screen should be about 15 to 25 inches from your eyes.  While typing, keep your wrist straight, in a neutral position. Move your entire arm when you move your mouse or when typing hard-to-reach keys.  Only use your computer as much as you need to for work. Do not use it during breaks.  Take breaks often from any repeated activity. Alternate with another task which requires you to use different muscles, or rest at least once every hour.  Change positions  regularly. If you spend a lot of time sitting, get up, walk around, and stretch.  Do not hold pens or pencils tightly when writing.  Exercise regularly.  Maintain a normal weight.  Eat a diet with plenty of vegetables, whole grains, and fruit.  Get sufficient, restful sleep. HOME CARE INSTRUCTIONS  If your caregiver prescribed medicine to help reduce swelling, take it as directed.  Only take over-the-counter or prescription medicines for pain, discomfort, or fever as directed by your caregiver.  Reduce, and if needed, stopthe activities that are causing your problems until you have no further symptoms.If your symptoms are work-related, you may need to talk to your supervisor about changing your activities.  When symptoms develop, put ice or a cold pack on the aching area.  Put ice in a plastic bag.  Place a towel between your skin and the bag.  Leave the ice on for 15-20 minutes.  If you were given a splint to keep your wrist from bending, wear it as instructed. It is important to wear the splint at night. Use the splint for as long as your caregiver recommends. SEEK MEDICAL CARE IF:  You develop new problems.  Your problems do not get better with medicine. MAKE SURE YOU:  Understand these instructions.  Will watch your condition.  Will get help right away if you are not doing well or get worse. Document Released: 06/29/2002 Document Revised: 01/08/2012 Document Reviewed: 08/30/2011 [facility] Patient Information 2015 [place], [place]. This information is not intended to replace advice given to you by your health care provider. Make sure you discuss any questions you have with your health care provider.  Tendinitis  Tendinitis is redness, soreness, and puffiness (inflammation) of the tendons. Tendons are band-like tissues that connect muscle to bone. Tendinitis often happens in the shoulders, heels, or elbows. It might happen if your job involves doing the same motions over  and over. HOME CARE  Use a sling or splint as told by your doctor.  Put ice on the injured area.  Put ice in a plastic bag.  Place a towel between your skin and the bag.  Leave the ice on for 15-20 minutes, 03-04 times a day.  Avoid using your injured arm or leg until the pain goes away.  Do gentle exercises only as told by your doctor. Stop exercises if the pain gets worse, unless your doctor tells you otherwise.  Only take medicines as told by your doctor. GET HELP RIGHT AWAY IF:  Your pain and puffiness get worse.  You have new problems, such as loss of feeling (numbness) in the hands. MAKE SURE YOU:  Understand these instructions.  Will watch your condition.  Will get help right away if you are not doing well or get worse. Document Released: 10/19/2010 Document Revised: 10/01/2011 Document Reviewed: 10/19/2010 [facility] Patient Information 2015 [place], [place]. This information is not intended  to replace advice given to you by your health care provider. Make sure you discuss any questions you have with your health care provider. ° °

## 2015-05-16 ENCOUNTER — Encounter (HOSPITAL_COMMUNITY): Payer: Self-pay | Admitting: Emergency Medicine

## 2015-05-16 ENCOUNTER — Emergency Department (INDEPENDENT_AMBULATORY_CARE_PROVIDER_SITE_OTHER)
Admission: EM | Admit: 2015-05-16 | Discharge: 2015-05-16 | Disposition: A | Discharge status: Home / Self Care | Payer: Medicaid Other | Source: Home / Self Care

## 2015-05-16 DIAGNOSIS — T8089XS Other complications following infusion, transfusion and therapeutic injection, sequela: Secondary | ICD-10-CM

## 2015-05-16 NOTE — ED Provider Notes (Signed)
CSN: [id number]     Arrival date & time 05/16/15  1805 History   None    Chief Complaint  Patient presents with  . injection site issues    (Consider location/radiation/quality/duration/timing/severity/associated sxs/prior Treatment) The history is provided by the patient.    Past Medical History  Diagnosis Date  . Anemia   . Vertigo   . Anxiety   . Anxiety   . Panic attacks    Past Surgical History  Procedure Laterality Date  . No past surgeries     Family History  Problem Relation Age of Onset  . Seizures    . Heart failure    . Diabetes Mother   . Hypertension Mother   . Heart disease Father   . Seizures Father   . Diabetes Sister    Social History  Substance Use Topics  . Smoking status: Never Smoker   . Smokeless tobacco: Never Used  . Alcohol Use: No     Comment: social   OB History    Gravida Para Term Preterm AB TAB SAB Ectopic Multiple Living   3 3             Review of Systems  Constitutional: Negative.   HENT: Negative.   Eyes: Negative.   Respiratory: Negative.   Cardiovascular: Negative.   Gastrointestinal: Negative.   Endocrine: Negative.   Genitourinary: Negative.   Musculoskeletal: Positive for myalgias.  Skin: Positive for color change.  Allergic/Immunologic: Negative.   Neurological: Negative.   Hematological: Negative.   Psychiatric/Behavioral: Negative.     Allergies  Review of patient's allergies indicates no known allergies.  Home Medications   Prior to Admission medications   Medication Sig Start Date End Date Taking? Authorizing Provider  etonogestrel (IMPLANON) 68 MG IMPL implant Inject 1 each into the skin once.   Yes Historical Provider, MD  sertraline (ZOLOFT) 100 MG tablet Take 1 tablet (100 mg total) by mouth daily. 10/02/14  Yes Gregor Hams, MD  Vitamin D, Ergocalciferol, (DRISDOL) 50000 UNITS CAPS capsule Take 1 capsule (50,000 Units total) by mouth every 7 (seven) days. 12/16/13  Yes Deepak Advani, MD  LORazepam  (ATIVAN) 1 MG tablet Take 1 mg by mouth every 8 (eight) hours as needed for anxiety.    Historical Provider, MD  omeprazole (PRILOSEC) 40 MG capsule Take 1 capsule (40 mg total) by mouth daily. 03/12/14   Gregor Hams, MD  traZODone (DESYREL) 50 MG tablet Take 50 mg by mouth at bedtime.    Historical Provider, MD  venlafaxine (EFFEXOR) 75 MG tablet Take 75 mg by mouth 2 (two) times daily.    Historical Provider, MD   Meds Ordered and Administered this Visit  Medications - No data to display  BP 130/83 mmHg  Pulse 88  Temp(Src) 98.4 F (36.9 C) (Oral)  Resp 16  SpO2 100% No data found.   Physical Exam  Constitutional: She appears well-developed and well-nourished.  HENT:  Head: Normocephalic and atraumatic.  Right Ear: External ear normal.  Left Ear: External ear normal.  Mouth/Throat: Oropharynx is clear and moist.  Eyes: Conjunctivae and EOM are normal. Pupils are equal, round, and reactive to light.  Neck: Normal range of motion. Neck supple.  Cardiovascular: Normal rate, regular rhythm and normal heart sounds.   Pulmonary/Chest: Effort normal and breath sounds normal.  Musculoskeletal: She exhibits tenderness.  Left deltoid tenderness and muscle spasm.  Skin: There is erythema.  Left deltoid erythema over tetanus injection she had 3 days  ago.    ED Course  Procedures (including critical care time)  Labs Review Labs Reviewed - No data to display  Imaging Review No results found.   Visual Acuity Review  Right Eye Distance:   Left Eye Distance:   Bilateral Distance:    Right Eye Near:   Left Eye Near:    Bilateral Near:         MDM   1. Injection site irritation, sequela    Do not suspect any infection more of reaction due to tetanus shot and advised to take benadryl otc prn itching and tylenol and motrin otc as directed for discomfort and recommend apply a warm compress or heat at sight and this should resolve in next few days and if not follow  up.  [place], FNP 05/16/15 [phone]

## 2015-05-16 NOTE — ED Notes (Signed)
Pt had a Tetanus shot three days ago in her left arm.  She now has a hard lump at the injection site and has 5/10 pain.  She also reports itching on her right arm where she had her flu shot four days ago.  She stated that it was swollen and red for a day or two, but it continues to itch.

## 2015-05-16 NOTE — Discharge Instructions (Signed)
Advised to take motrin and tylenol otc prn as directed and benadryl otc as directed for any itching.

## 2015-08-05 ENCOUNTER — Ambulatory Visit: Payer: Self-pay

## 2016-02-24 ENCOUNTER — Encounter (HOSPITAL_COMMUNITY): Payer: Self-pay

## 2016-02-24 ENCOUNTER — Ambulatory Visit (HOSPITAL_COMMUNITY)
Admission: EM | Admit: 2016-02-24 | Discharge: 2016-02-24 | Disposition: A | Discharge status: Home / Self Care | Payer: Medicaid Other | Attending: Emergency Medicine | Admitting: Emergency Medicine

## 2016-02-24 DIAGNOSIS — M25562 Pain in left knee: Secondary | ICD-10-CM

## 2016-02-24 MED ORDER — CYCLOBENZAPRINE HCL 10 MG PO TABS
10.0000 mg | ORAL_TABLET | Freq: Two times a day (BID) | ORAL | 0 refills | Status: DC | PRN
Start: 2016-02-24 — End: 2016-04-20

## 2016-02-24 MED ORDER — NAPROXEN 500 MG PO TABS: 500.0000 mg | ORAL_TABLET | Freq: Two times a day (BID) | ORAL | 0 refills | Status: DC
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
## 2016-03-21 ENCOUNTER — Ambulatory Visit (INDEPENDENT_AMBULATORY_CARE_PROVIDER_SITE_OTHER): Payer: Self-pay

## 2016-03-21 ENCOUNTER — Encounter (HOSPITAL_COMMUNITY): Payer: Self-pay | Admitting: Emergency Medicine

## 2016-03-21 ENCOUNTER — Ambulatory Visit (HOSPITAL_COMMUNITY)
Admission: EM | Admit: 2016-03-21 | Discharge: 2016-03-21 | Disposition: A | Discharge status: Home / Self Care | Payer: Medicaid Other | Attending: Emergency Medicine | Admitting: Emergency Medicine

## 2016-03-21 DIAGNOSIS — J849 Interstitial pulmonary disease, unspecified: Secondary | ICD-10-CM

## 2016-03-21 MED ORDER — AZITHROMYCIN 250 MG PO TABS: 250.0000 mg | ORAL_TABLET | Freq: Every day | ORAL | 0 refills | Status: AC

## 2016-03-21 NOTE — ED Provider Notes (Signed)
CSN: [id number]     Arrival date & time 03/21/16  1447 History   First MD Initiated Contact with Patient 03/21/16 1537     Chief Complaint  Patient presents with  . Shortness of Breath   (Consider location/radiation/quality/duration/timing/severity/associated sxs/prior Treatment) Breanna Thomas is a well-appearing 35 y.o female presents today complaining that she feels like there is something in her throat when she swallows and is worst when she lays down. This has been present for about 1-2 weeks. She was sick 2 weeks ago with a common cold. Ever since her cold, she started experiencing something stuck in her throat. Patient states that it doesn't feel like there is an object in her throat, feels like phlegm to her and is worst when she lays down. She reports that she normally sleeps on 2 pillows but feels like she needs to sleep on 3 pillows now. She denies fever at home, no chest pain, no abdominal pain, no N/V/D, no congestion but still have to blow her nose. She endorses headache, cough, fatigue (stays fatigue), running nose, sneezing and sore throat.       Past Medical History:  Diagnosis Date  . Anemia   . Anxiety   . Anxiety   . Panic attacks   . Vertigo    Past Surgical History:  Procedure Laterality Date  . NO PAST SURGERIES     Family History  Problem Relation Age of Onset  . Diabetes Mother   . Hypertension Mother   . Heart disease Father   . Seizures Father   . Seizures    . Heart failure    . Diabetes Sister    Social History  Substance Use Topics  . Smoking status: Never Smoker  . Smokeless tobacco: Never Used  . Alcohol use No     Comment: social   OB History    Gravida Para Term Preterm AB Living   3 3           SAB TAB Ectopic Multiple Live Births                 Review of Systems  Constitutional: Positive for fatigue. Negative for chills and fever.  HENT: Positive for rhinorrhea, sneezing and sore throat. Negative for congestion, ear pain and  postnasal drip.   Eyes: Negative for discharge, redness and itching.  Respiratory: Positive for cough. Negative for choking, chest tightness, shortness of breath and wheezing.   Cardiovascular: Negative for chest pain, palpitations and leg swelling.  Gastrointestinal: Negative for abdominal pain, diarrhea, nausea and vomiting.  Neurological: Positive for headaches. Negative for dizziness and weakness.    Allergies  Review of patient's allergies indicates no known allergies.  Home Medications   Prior to Admission medications   Medication Sig Start Date End Date Taking? Authorizing Provider  sertraline (ZOLOFT) 100 MG tablet Take 1 tablet (100 mg total) by mouth daily. 10/02/14  Yes Gregor Hams, MD  Vitamin D, Ergocalciferol, (DRISDOL) 50000 UNITS CAPS capsule Take 1 capsule (50,000 Units total) by mouth every 7 (seven) days. 12/16/13  Yes Deepak Advani, MD  azithromycin (ZITHROMAX Z-PAK) 250 MG tablet Take 1 tablet (250 mg total) by mouth daily. 03/21/16 03/26/16  Barry Dienes, NP  cyclobenzaprine (FLEXERIL) 10 MG tablet Take 1 tablet (10 mg total) by mouth 2 (two) times daily as needed for muscle spasms. 02/24/16   Melanee Left, NP  etonogestrel (IMPLANON) 68 MG IMPL implant Inject 1 each into the skin once.  Historical Provider, MD  LORazepam (ATIVAN) 1 MG tablet Take 1 mg by mouth every 8 (eight) hours as needed for anxiety.    Historical Provider, MD  naproxen (NAPROSYN) 500 MG tablet Take 1 tablet (500 mg total) by mouth 2 (two) times daily. 02/24/16   Melanee Left, NP  omeprazole (PRILOSEC) 40 MG capsule Take 1 capsule (40 mg total) by mouth daily. 03/12/14   Gregor Hams, MD  traZODone (DESYREL) 50 MG tablet Take 50 mg by mouth at bedtime.    Historical Provider, MD  venlafaxine (EFFEXOR) 75 MG tablet Take 75 mg by mouth 2 (two) times daily.    Historical Provider, MD   Meds Ordered and Administered this Visit  Medications - No data to display  BP 115/75 (BP Location: Left Arm)    Pulse 96   Temp 98.4 F (36.9 C) (Oral)   Resp 18   SpO2 98%  No data found.   Physical Exam  Constitutional: She is oriented to person, place, and time. She appears well-developed and well-nourished. No distress.  HENT:  Head: Normocephalic and atraumatic.  Right Ear: External ear normal.  Left Ear: External ear normal.  Mouth/Throat: Oropharynx is clear and moist. No oropharyngeal exudate.  Nasal mucosal redness present  Eyes: Conjunctivae and EOM are normal. Pupils are equal, round, and reactive to light.  Neck: Normal range of motion. Neck supple. No tracheal deviation present. No thyromegaly present.  Cardiovascular: Normal rate, regular rhythm, normal heart sounds and intact distal pulses.   Pulmonary/Chest: Effort normal and breath sounds normal. No stridor. No respiratory distress. She has no wheezes. She has no rales. She exhibits no tenderness.  Abdominal: Soft. Bowel sounds are normal. There is no tenderness.  Musculoskeletal: She exhibits no edema.  Lymphadenopathy:    She has no cervical adenopathy.  Neurological: She is alert and oriented to person, place, and time.  Skin: Skin is warm and dry. She is not diaphoretic.  Psychiatric: She has a normal mood and affect.  Nursing note and vitals reviewed.   Urgent Care Course   Clinical Course    Procedures (including critical care time)  Labs Review Labs Reviewed - No data to display  Imaging Review Dg Chest 2 View  Result Date: 03/21/2016 CLINICAL DATA:  Respiratory difficulty, recent episode of productive cough with persistent intermittent cough now. EXAM: CHEST  2 VIEW COMPARISON:  PA and lateral chest x-ray of January 07, 2014 FINDINGS: The lungs are adequately inflated. The interstitial markings are increased bilaterally. There is no alveolar infiltrate. There is no pleural effusion. The trachea is midline. The bony thorax exhibits no acute abnormality. IMPRESSION: Mildly increased interstitial markings  bilaterally suggest mild interstitial pneumonia or subsegmental atelectasis as might be seen with acute bronchitis. There is no alveolar pneumonia nor CHF. The trachea is midline. The cervical trachea where visualized appears normal. Electronically Signed   By: David  Martinique M.D.   On: 03/21/2016 16:27       MDM   1. Interstitial pneumonia [facility])    Patient has nasal mucosal erythema on exam but otherwise physical examination was unremarkable. Respiratory examination was absolutely normal. Chest xray reveals midline trachea, the cervical trachea appears normal as well with no foreign object. Chest xray also reveals mildly increased interstitial markings bilaterally suggesting mild interstitial pneumonia or subsegmental atelectasis seem with acute bronchitis. She is afebrile. She is not in distress. Given her recent URI and current sore throat, running nose, sneezing and coughing, I am favoring  acute bronchitis, However rx given for Z-pack to treat possible pneumonia. Patient stressed to follow up with PCP if she does not improve. Instructed to take Mucinex and stay well hydrated to help with her throat. All questions were answered. Discharge instruction given.     Barry Dienes, NP 03/21/16 [phone]

## 2016-03-21 NOTE — ED Triage Notes (Signed)
The patient presented to the [facility] with a complaint of trouble breathing x 1 week. The patient stated that when she breathes in she feels as if something is blocking her upper airway....that gets worse at night when she lays down.

## 2016-03-23 ENCOUNTER — Ambulatory Visit (HOSPITAL_COMMUNITY)
Admission: EM | Admit: 2016-03-23 | Discharge: 2016-03-23 | Discharge status: Absent without leave | Payer: Medicaid Other

## 2016-03-28 ENCOUNTER — Ambulatory Visit: Payer: Self-pay

## 2016-03-29 ENCOUNTER — Inpatient Hospital Stay: Payer: Self-pay

## 2016-03-30 ENCOUNTER — Ambulatory Visit: Payer: Self-pay

## 2016-04-03 ENCOUNTER — Ambulatory Visit: Payer: Self-pay | Attending: Internal Medicine | Admitting: Physician Assistant

## 2016-04-03 VITALS — BP 121/79 | HR 101 | Temp 98.3°F | Ht 64.0 in | Wt 259.2 lb

## 2016-04-03 DIAGNOSIS — Z79899 Other long term (current) drug therapy: Secondary | ICD-10-CM | POA: Insufficient documentation

## 2016-04-03 DIAGNOSIS — R0600 Dyspnea, unspecified: Secondary | ICD-10-CM

## 2016-04-03 DIAGNOSIS — R0602 Shortness of breath: Secondary | ICD-10-CM | POA: Insufficient documentation

## 2016-04-03 DIAGNOSIS — R05 Cough: Secondary | ICD-10-CM | POA: Insufficient documentation

## 2016-04-03 DIAGNOSIS — R059 Cough, unspecified: Secondary | ICD-10-CM

## 2016-04-03 MED ORDER — PREDNISONE 20 MG PO TABS: 10.0000 mg | ORAL_TABLET | Freq: Every day | ORAL | 0 refills | Status: AC

## 2016-04-03 MED ORDER — ALBUTEROL SULFATE HFA 108 (90 BASE) MCG/ACT IN AERS: 2.0000 | INHALATION_SPRAY | Freq: Four times a day (QID) | RESPIRATORY_TRACT | 0 refills | Status: DC | PRN

## 2016-04-03 MED FILL — VENTOLIN HFA 90 MCG INHALER: 108 (90 BAS | 30 days supply | Qty: 18 | Fill #0

## 2016-04-03 MED FILL — ?PREDNISONE 20 MG TABLET: 20 | 10 days supply | Qty: 5 | Fill #0

## 2016-04-03 NOTE — Progress Notes (Signed)
"  shock" pains in abdomen and back when laying down Would like the implanon removed

## 2016-04-03 NOTE — Progress Notes (Signed)
Chief Complaint: "still having coughing spells and is short of breath"  Subjective: 35 yo female not seen here in nearly two years but has a hx of panic and anxiety presents with [facility] and coughing. She was seen in urgent care 2 weeks ago with similar sxs. They did a CXray that showed increased interstitial markings and she was empirically treated for a walking PNA/bronchitis with a Z pak. She feels a little better. She can now lay down without SHOB. She still is dyspneic with movement and having coughing fits. No sputum. No wheezing, No LEE.    ROS:  GEN: denies fever or chills, denies change in weight Skin: denies lesions or rashes HEENT: denies headache, earache, epistaxis, sore throat, or neck pain LUNGS: + SHOB, dyspnea, PND, orthopnea CV: denies CP or palpitations ABD: denies abd pain, N or V EXT: denies muscle spasms or swelling; no pain in lower ext, no weakness NEURO: denies numbness or tingling, denies sz, stroke or TIA   Objective:  Vitals:   04/03/16 1502  BP: 121/79  Pulse: (!) 101  Temp: 98.3 F (36.8 C)  TempSrc: Oral  SpO2: 99%  Weight: 259 lb 3.2 oz (117.6 kg)  Height: 5\' 4"  (1.626 m)    Physical Exam:Benign  General: in no acute distress. HEENT: no pallor, no icterus, moist oral mucosa, no JVD, no lymphadenopathy Heart: Normal  s1 &s2  Regular rate and rhythm, without murmurs, rubs, gallops. Lungs: Clear to auscultation bilaterally. Abdomen: Soft, nontender, nondistended, positive bowel sounds. Extremities: No clubbing cyanosis or edema with positive pedal pulses. Neuro: Alert, awake, oriented x3, nonfocal.  Pertinent Lab Results:none   Medications: Prior to Admission medications   Medication Sig Start Date End Date Taking? Authorizing Provider  etonogestrel (IMPLANON) 68 MG IMPL implant Inject 1 each into the skin once.   Yes Historical Provider, MD  sertraline (ZOLOFT) 100 MG tablet Take 1 tablet (100 mg total) by mouth daily. 10/02/14  Yes Gregor Hams, MD  Vitamin D, Ergocalciferol, (DRISDOL) 50000 UNITS CAPS capsule Take 1 capsule (50,000 Units total) by mouth every 7 (seven) days. 12/16/13  Yes Lorayne Marek, MD  albuterol (PROVENTIL HFA;VENTOLIN HFA) 108 (90 Base) MCG/ACT inhaler Inhale 2 puffs into the lungs every 6 (six) hours as needed for wheezing or shortness of breath. 04/03/16   Tiffany Daneil Dan, PA-C  cyclobenzaprine (FLEXERIL) 10 MG tablet Take 1 tablet (10 mg total) by mouth 2 (two) times daily as needed for muscle spasms. Patient not taking: Reported on 04/03/2016 02/24/16   Melanee Left, NP  LORazepam (ATIVAN) 1 MG tablet Take 1 mg by mouth every 8 (eight) hours as needed for anxiety.    Historical Provider, MD  naproxen (NAPROSYN) 500 MG tablet Take 1 tablet (500 mg total) by mouth 2 (two) times daily. Patient not taking: Reported on 04/03/2016 02/24/16   Melanee Left, NP  omeprazole (PRILOSEC) 40 MG capsule Take 1 capsule (40 mg total) by mouth daily. Patient not taking: Reported on 04/03/2016 03/12/14   Gregor Hams, MD  predniSONE (DELTASONE) 20 MG tablet Take 0.5 tablets (10 mg total) by mouth daily with breakfast. 04/03/16 04/08/16  Brayton Caves, PA-C  traZODone (DESYREL) 50 MG tablet Take 50 mg by mouth at bedtime.    Historical Provider, MD  venlafaxine (EFFEXOR) 75 MG tablet Take 75 mg by mouth 2 (two) times daily.    Historical Provider, MD    Assessment: 1. Dyspnea/cough   Plan: Add Prednisone and Albuterol MDI  to regimen If no improvement, consider cardiac workup  Follow up:1-2 weeks  The patient was given clear instructions to go to ER or return to medical center if symptoms don't improve, worsen or new problems develop. The patient verbalized understanding. The patient was told to call to get lab results if they haven't heard anything in the next week.   This note has been created with Surveyor, quantity. Any transcriptional errors are unintentional.    Zettie Pho, PA-C 04/03/2016, 3:33 PM

## 2016-04-20 ENCOUNTER — Encounter: Payer: Self-pay | Admitting: Family Medicine

## 2016-04-20 ENCOUNTER — Ambulatory Visit: Payer: Self-pay | Attending: Family Medicine | Admitting: Family Medicine

## 2016-04-20 VITALS — BP 114/79 | HR 101 | Temp 98.3°F | Ht 64.0 in | Wt 260.2 lb

## 2016-04-20 DIAGNOSIS — F419 Anxiety disorder, unspecified: Secondary | ICD-10-CM | POA: Insufficient documentation

## 2016-04-20 DIAGNOSIS — R059 Cough, unspecified: Secondary | ICD-10-CM | POA: Insufficient documentation

## 2016-04-20 DIAGNOSIS — R918 Other nonspecific abnormal finding of lung field: Secondary | ICD-10-CM | POA: Insufficient documentation

## 2016-04-20 DIAGNOSIS — Z6841 Body Mass Index (BMI) 40.0 and over, adult: Secondary | ICD-10-CM

## 2016-04-20 DIAGNOSIS — R05 Cough: Secondary | ICD-10-CM | POA: Insufficient documentation

## 2016-04-20 DIAGNOSIS — Z79899 Other long term (current) drug therapy: Secondary | ICD-10-CM | POA: Insufficient documentation

## 2016-04-20 DIAGNOSIS — K219 Gastro-esophageal reflux disease without esophagitis: Secondary | ICD-10-CM

## 2016-04-20 DIAGNOSIS — Z3046 Encounter for surveillance of implantable subdermal contraceptive: Secondary | ICD-10-CM

## 2016-04-20 DIAGNOSIS — K210 Gastro-esophageal reflux disease with esophagitis: Secondary | ICD-10-CM | POA: Insufficient documentation

## 2016-04-20 DIAGNOSIS — Z23 Encounter for immunization: Secondary | ICD-10-CM | POA: Insufficient documentation

## 2016-04-20 DIAGNOSIS — R079 Chest pain, unspecified: Secondary | ICD-10-CM | POA: Insufficient documentation

## 2016-04-20 MED ORDER — OMEPRAZOLE 20 MG PO CPDR: 20.0000 mg | DELAYED_RELEASE_CAPSULE | Freq: Every day | ORAL | 3 refills | Status: DC

## 2016-04-20 NOTE — Progress Notes (Signed)
Subjective:  Patient ID: [name], female    DOB: [birth year]  Age: 35 y.o. MRN: [id number]  CC: Follow-up (cough and SOB)   HPI Dawnisha E Riesen has hx of GERD, anxiety she  presents for   1. Cough: started 6 weeks ago with cold symptoms and sensation of something in her throat. She presented to [facility] on 03/21/16, CXR revealed increased interstitial markings. She was treated with Z-pac for interstitial pneumonia or acute bronchitis. She re-established care here on 04/03/16. She was feeling better.  She was able to lay down without shortness of breath but still has dyspnea on movement and coughing fits. She was treated with prednisone and albuterol. She has been afebrile. No weight loss. She has no hx of cigarettes, cocaine, asthma. She has hx of GERD with 50 # weight gain in the last 2 years. She is not taking PPI. She admits to intermittent chest pains.   2. Nexplanon: she has a nexplanon in her R forearm that was placed in 1-2 years ago, she is unsure. Since then she has gained 50#. She would like the nexplanon removed. She is interested in tubal ligation for contraception. She has 3 children. She is uninsured with the [place] discount.   Social History  Substance Use Topics  . Smoking status: Never Smoker  . Smokeless tobacco: Never Used  . Alcohol use No     Comment: social   Outpatient Medications Prior to Visit  Medication Sig Dispense Refill  . albuterol (PROVENTIL HFA;VENTOLIN HFA) 108 (90 Base) MCG/ACT inhaler Inhale 2 puffs into the lungs every 6 (six) hours as needed for wheezing or shortness of breath. 1 Inhaler 0  . etonogestrel (IMPLANON) 68 MG IMPL implant Inject 1 each into the skin once.    . sertraline (ZOLOFT) 100 MG tablet Take 1 tablet (100 mg total) by mouth daily. 30 tablet 0  . Vitamin D, Ergocalciferol, (DRISDOL) 50000 UNITS CAPS capsule Take 1 capsule (50,000 Units total) by mouth every 7 (seven) days. 12 capsule 0  . cyclobenzaprine  (FLEXERIL) 10 MG tablet Take 1 tablet (10 mg total) by mouth 2 (two) times daily as needed for muscle spasms. (Patient not taking: Reported on 04/20/2016) 20 tablet 0  . LORazepam (ATIVAN) 1 MG tablet Take 1 mg by mouth every 8 (eight) hours as needed for anxiety.    . naproxen (NAPROSYN) 500 MG tablet Take 1 tablet (500 mg total) by mouth 2 (two) times daily. (Patient not taking: Reported on 04/20/2016) 30 tablet 0  . omeprazole (PRILOSEC) 40 MG capsule Take 1 capsule (40 mg total) by mouth daily. (Patient not taking: Reported on 04/20/2016) 30 capsule 1  . traZODone (DESYREL) 50 MG tablet Take 50 mg by mouth at bedtime.    Marland Kitchen venlafaxine (EFFEXOR) 75 MG tablet Take 75 mg by mouth 2 (two) times daily.     No facility-administered medications prior to visit.     ROS Review of Systems  Constitutional: Negative for chills and fever.  Eyes: Negative for visual disturbance.  Respiratory: Positive for cough. Negative for shortness of breath.   Cardiovascular: Positive for chest pain.  Gastrointestinal: Negative for abdominal pain and blood in stool.  Musculoskeletal: Negative for arthralgias and back pain.  Allergic/Immunologic: Negative for immunocompromised state.  Hematological: Negative for adenopathy. Does not bruise/bleed easily.  Psychiatric/Behavioral: Negative for dysphoric mood and suicidal ideas.    Objective:  BP 114/79 (BP Location: Left Arm, Patient Position: Sitting, Cuff Size: Small)  Pulse (!) 101   Temp 98.3 F (36.8 C) (Oral)   Ht 5\' 4"  (1.626 m)   Wt 260 lb 3.2 oz (118 kg)   SpO2 99%   BMI 44.66 kg/m   BP/Weight 04/03/2016 [id number]  Systolic BP [id number]  Diastolic BP 79 75 77  Wt. (Lbs) 259.2 - -  BMI 44.49 - -   Wt Readings from Last 3 Encounters:  04/20/16 260 lb 3.2 oz (118 kg)  04/03/16 259 lb 3.2 oz (117.6 kg)  12/30/13 208 lb 6.4 oz (94.5 kg)    Physical Exam  Constitutional: She is oriented to person, place, and time. She appears  well-developed and well-nourished. No distress.  HENT:  Head: Normocephalic and atraumatic.  Cardiovascular: Normal rate, regular rhythm, normal heart sounds and intact distal pulses.   Pulmonary/Chest: Effort normal and breath sounds normal.  Musculoskeletal: She exhibits no edema.  Neurological: She is alert and oriented to person, place, and time.  Skin: Skin is warm and dry. No rash noted.  Psychiatric: She has a normal mood and affect.    Assessment & Plan:  Christmas was seen today for follow-up.  Diagnoses and all orders for this visit:  Nexplanon removal -     Ambulatory referral to Gynecology  Gastroesophageal reflux disease, esophagitis presence not specified -     omeprazole (PRILOSEC) 20 MG capsule; Take 1 capsule (20 mg total) by mouth daily.  Needs flu shot -     Flu Vaccine QUAD 36+ mos IM  Other orders -     DG Chest 2 View; Future   There are no diagnoses linked to this encounter.  No orders of the defined types were placed in this encounter.   Follow-up: Return in about 4 weeks (around 05/18/2016) for cough/GERD .   Boykin Nearing MD

## 2016-04-20 NOTE — Progress Notes (Signed)
Pt is getting flu shot today.  Pt is following up on cough.

## 2016-04-20 NOTE — Assessment & Plan Note (Signed)
A: GERD P: Restart prilosec GERD diet

## 2016-04-20 NOTE — Patient Instructions (Addendum)
Breanna Thomas was seen today for follow-up.  Diagnoses and all orders for this visit:  Nexplanon removal -     Ambulatory referral to Gynecology  Gastroesophageal reflux disease, esophagitis presence not specified -     omeprazole (PRILOSEC) 20 MG capsule; Take 1 capsule (20 mg total) by mouth daily.  Needs flu shot -     Flu Vaccine QUAD 36+ mos IM  Other orders -     DG Chest 2 View; Future   Moisturizers  aquaphor for body Neutrogena healthy defense for face   Zyrtec 10 mg daily as needed for allergy symptoms   Please go for repeat chest x-ray between 10/11-10/25/17   F/u in 4 weeks for GERD/cough   Dr. Adrian Blackwater   Food Choices for Gastroesophageal Reflux Disease, Adult When you have gastroesophageal reflux disease (GERD), the foods you eat and your eating habits are very important. Choosing the right foods can help ease the discomfort of GERD. WHAT GENERAL GUIDELINES DO I NEED TO FOLLOW?  Choose fruits, vegetables, whole grains, low-fat dairy products, and low-fat meat, fish, and poultry.  Limit fats such as oils, salad dressings, butter, nuts, and avocado.  Keep a food diary to identify foods that cause symptoms.  Avoid foods that cause reflux. These may be different for different people.  Eat frequent small meals instead of three large meals each day.  Eat your meals slowly, in a relaxed setting.  Limit fried foods.  Cook foods using methods other than frying.  Avoid drinking alcohol.  Avoid drinking large amounts of liquids with your meals.  Avoid bending over or lying down until 2-3 hours after eating. WHAT FOODS ARE NOT RECOMMENDED? The following are some foods and drinks that may worsen your symptoms: Vegetables Tomatoes. Tomato juice. Tomato and spaghetti sauce. Chili peppers. Onion and garlic. Horseradish. Fruits Oranges, grapefruit, and lemon (fruit and juice). Meats High-fat meats, fish, and poultry. This includes hot dogs, ribs, ham, sausage,  salami, and bacon. Dairy Whole milk and chocolate milk. Sour cream. Cream. Butter. Ice cream. Cream cheese.  Beverages Coffee and tea, with or without caffeine. Carbonated beverages or energy drinks. Condiments Hot sauce. Barbecue sauce.  Sweets/Desserts Chocolate and cocoa. Donuts. Peppermint and spearmint. Fats and Oils High-fat foods, including [place] fries and potato chips. Other Vinegar. Strong spices, such as black pepper, white pepper, red pepper, cayenne, curry powder, cloves, ginger, and chili powder. The items listed above may not be a complete list of foods and beverages to avoid. Contact your dietitian for more information.   This information is not intended to replace advice given to you by your health care provider. Make sure you discuss any questions you have with your health care provider.   Document Released: 07/09/2005 Document Revised: 07/30/2014 Document Reviewed: 05/13/2013 Elsevier Interactive Patient Education [employer].   Dr. Adrian Blackwater

## 2016-04-20 NOTE — Assessment & Plan Note (Signed)
Persistent cough suspect GERD Plan Restart PPI GERD diet

## 2016-04-25 MED FILL — OMEPRAZOLE DR 20 MG CAPSULE: 20 | 30 days supply | Qty: 30 | Fill #0

## 2016-05-16 ENCOUNTER — Ambulatory Visit (HOSPITAL_COMMUNITY)
Admission: RE | Admit: 2016-05-16 | Discharge: 2016-05-16 | Disposition: A | Discharge status: Home / Self Care | Payer: Self-pay | Source: Ambulatory Visit | Attending: Family Medicine | Admitting: Family Medicine

## 2016-05-16 ENCOUNTER — Ambulatory Visit: Payer: Self-pay | Admitting: Obstetrics & Gynecology

## 2016-05-16 DIAGNOSIS — R059 Cough, unspecified: Secondary | ICD-10-CM

## 2016-05-16 DIAGNOSIS — R05 Cough: Secondary | ICD-10-CM | POA: Insufficient documentation

## 2016-05-18 ENCOUNTER — Telehealth: Payer: Self-pay | Admitting: Family Medicine

## 2016-05-18 NOTE — Telephone Encounter (Signed)
Patient called requesting x-ray results.  X- ray was done yesturday. Please f/u

## 2016-05-19 ENCOUNTER — Emergency Department (HOSPITAL_COMMUNITY)
Admission: EM | Admit: 2016-05-19 | Discharge: 2016-05-19 | Disposition: A | Discharge status: Home / Self Care | Payer: Self-pay | Attending: Emergency Medicine | Admitting: Emergency Medicine

## 2016-05-19 ENCOUNTER — Encounter (HOSPITAL_COMMUNITY): Payer: Self-pay | Admitting: Emergency Medicine

## 2016-05-19 DIAGNOSIS — Z79899 Other long term (current) drug therapy: Secondary | ICD-10-CM | POA: Insufficient documentation

## 2016-05-19 DIAGNOSIS — R0602 Shortness of breath: Secondary | ICD-10-CM | POA: Insufficient documentation

## 2016-05-19 NOTE — Discharge Instructions (Signed)
Continue taking her home prescriptions of omeprazole and using your albuterol inhaler as prescribed. Continue drinking fluids at home to remain hydrated. I also recommend taking over-the-counter allergy medications such as Allegra for your symptoms being possible due to seasonal allergies.  Follow-up with your primary care provider within the next week if her symptoms have not improved. Please return to the Emergency Department if symptoms worsen or new onset of fever, headache, lightheadedness, dizziness, sensation of throat closing, facial/neck swelling, drooling, unable to open mouth completely, shortness of breath, difficulty breathing, chest pain, vomiting, unable to keep fluids down.

## 2016-05-19 NOTE — ED Triage Notes (Signed)
Patient c/o feeling SOB where something is blocking her airway in trachea area last night and then again this morning.  Patient states that she was treated for PNA about month ago and has inhaler.  Patient states that she used inhaler last night without any noticed difference.  Patient also diagnosed with GERD and takes Omeprazole daily.

## 2016-05-19 NOTE — ED Provider Notes (Signed)
[facility] DEPT Provider Note   CSN: [id number] Arrival date & time: 05/19/16  0900     History   Chief Complaint Chief Complaint  Patient presents with  . Shortness of Breath  . Cough    HPI Breanna Thomas is a 35 y.o. female.  HPI   Patient is a 35 year old female with history of GERD, anxiety who presents the ED with complaints of intermittent cough and shortness of breath, onset 6 weeks. Patient reports she has had an intermittent nonproductive cough that started 6 weeks ago. She reports having a change in her breathing and reports feeling like her breathing feels different in her throat. Pt reports "I feel that I just inhaled smoke". She notes this sensation is worse when laying down. Denies having sensation of something being stuck in her throat or sensation of throat closing. Denies smoking. She notes she was initially seen at [facility] urgent care on 03/21/16, chest x-ray showed increased interstitial markings and patient was treated with Z-Pak for suspected pneumonia versus acute bronchitis. Patient then followed up with her PCP on 9/12 and was given prednisone and albuterol for continued shortness of breath and was scheduled to have a repeat chest x-ray performed. She reports improvement of her symptoms with the steroids but notes when she completed the dose her symptoms return. She notes she has been taking her prescription of omeprazole for GERD as prescribed. She notes she had a repeat chest x-ray performed 3 days ago. Denies fever, chills, headache, nasal congestion, rhinorrhea, sore throat, facial/neck swelling, trismus, drooling, voice change, stridor, wheezing, hemoptysis, chest pain, abdominal pain, vomiting.  Past Medical History:  Diagnosis Date  . Anemia   . Anxiety   . Anxiety   . Panic attacks   . Vertigo     Patient Active Problem List   Diagnosis Date Noted  . GERD (gastroesophageal reflux disease) 04/20/2016  . Morbid obesity with body mass index (BMI)  of 40.0 to 44.9 in adult [facility]) 04/20/2016  . Cough 04/20/2016  . IFG (impaired fasting glucose) 12/30/2013  . Unspecified vitamin D deficiency 12/30/2013  . Back muscle spasm 12/15/2013  . Anxiety 04/10/2013    Past Surgical History:  Procedure Laterality Date  . NO PAST SURGERIES      OB History    Gravida Para Term Preterm AB Living   3 3           SAB TAB Ectopic Multiple Live Births                   Home Medications    Prior to Admission medications   Medication Sig Start Date End Date Taking? Authorizing Provider  albuterol (PROVENTIL HFA;VENTOLIN HFA) 108 (90 Base) MCG/ACT inhaler Inhale 2 puffs into the lungs every 6 (six) hours as needed for wheezing or shortness of breath. 04/03/16   Tiffany Daneil Dan, PA-C  etonogestrel (IMPLANON) 68 MG IMPL implant Inject 1 each into the skin once.    Historical Provider, MD  LORazepam (ATIVAN) 1 MG tablet Take 1 mg by mouth every 8 (eight) hours as needed for anxiety.    Historical Provider, MD  omeprazole (PRILOSEC) 20 MG capsule Take 1 capsule (20 mg total) by mouth daily. 04/20/16   Josalyn Funches, MD  sertraline (ZOLOFT) 100 MG tablet Take 1 tablet (100 mg total) by mouth daily. 10/02/14   Gregor Hams, MD  traZODone (DESYREL) 50 MG tablet Take 50 mg by mouth at bedtime.    Historical Provider, MD  venlafaxine (EFFEXOR) 75 MG tablet Take 75 mg by mouth 2 (two) times daily.    Historical Provider, MD  Vitamin D, Ergocalciferol, (DRISDOL) 50000 UNITS CAPS capsule Take 1 capsule (50,000 Units total) by mouth every 7 (seven) days. 12/16/13   Lorayne Marek, MD    Family History Family History  Problem Relation Age of Onset  . Diabetes Mother   . Hypertension Mother   . Heart disease Father   . Seizures Father   . Seizures    . Heart failure    . Diabetes Sister     Social History Social History  Substance Use Topics  . Smoking status: Never Smoker  . Smokeless tobacco: Never Used  . Alcohol use No     Comment: social      Allergies   Review of patient's allergies indicates no known allergies.   Review of Systems Review of Systems  HENT: Positive for rhinorrhea. Negative for sore throat and trouble swallowing.   Respiratory: Positive for cough and shortness of breath. Negative for wheezing and stridor.   All other systems reviewed and are negative.    Physical Exam Updated Vital Signs BP 109/86 (BP Location: Left Arm)   Pulse 95   Temp 98.2 F (36.8 C) (Oral)   Resp 18   Ht 5\' 4"  (1.626 m)   Wt 117.9 kg   LMP 04/19/2016   SpO2 100%   BMI 44.63 kg/m   Physical Exam  Constitutional: She is oriented to person, place, and time. She appears well-developed and well-nourished.  HENT:  Head: Normocephalic and atraumatic.  Right Ear: Tympanic membrane normal.  Left Ear: Tympanic membrane normal.  Nose: Nose normal. Right sinus exhibits no maxillary sinus tenderness and no frontal sinus tenderness. Left sinus exhibits no maxillary sinus tenderness and no frontal sinus tenderness.  Mouth/Throat: Uvula is midline, oropharynx is clear and moist and mucous membranes are normal. No trismus in the jaw. No uvula swelling. No oropharyngeal exudate, posterior oropharyngeal edema, posterior oropharyngeal erythema or tonsillar abscesses. No tonsillar exudate.  Eyes: Conjunctivae and EOM are normal. Right eye exhibits no discharge. Left eye exhibits no discharge. No scleral icterus.  Neck: Normal range of motion. Neck supple.  Cardiovascular: Normal rate, regular rhythm, normal heart sounds and intact distal pulses.   Pulmonary/Chest: Effort normal and breath sounds normal. No stridor. No respiratory distress. She has no wheezes. She has no rales. She exhibits no tenderness.  Abdominal: Soft. She exhibits no distension.  Musculoskeletal: Normal range of motion. She exhibits no edema.  Lymphadenopathy:    She has no cervical adenopathy.  Neurological: She is alert and oriented to person, place, and time.   Skin: Skin is warm and dry.  Nursing note and vitals reviewed.    ED Treatments / Results  Labs (all labs ordered are listed, but only abnormal results are displayed) Labs Reviewed - No data to display  EKG  EKG Interpretation None       Radiology No results found.  Procedures Procedures (including critical care time)  Medications Ordered in ED Medications - No data to display   Initial Impression / Assessment and Plan / ED Course  I have reviewed the triage vital signs and the nursing notes.  Pertinent labs & imaging results that were available during my care of the patient were reviewed by me and considered in my medical decision making (see chart for details).  Clinical Course    Patient presents with complaints of intermittent change in her breathing  which she describes as "I feel that I just inhaled smoke". Endorses mild intermittent nonproductive cough. She notes her symptoms have been present intermittently for the past 6 weeks she was treated for suspected pneumonia versus bronchitis with antibiotics over a month ago. Denies smoking. She reports having repeat chest x-ray performed 3 days ago by her PCP. VSS. Exam unremarkable, lungs clear to auscultation bilaterally. Patient tolerating secretions, no stridor. Chart review shows chest x-ray performed on 10/25 was unremarkable. Patient was seen by her PCP on 9/29, symptoms were thought to be due too GERD, patient was started on omeprazole which she endorses taking as prescribed. Due to patient with no symptoms during evaluation in the ED today I do not feel that repeat imaging is warranted at this time. Suspect patient's symptoms may likely be contributed to GERD versus seasonal allergies. Advised patient to continue taking her PPI as prescribed along with using her albuterol inhaler as needed. Advised pt to take OTC Allegra as prescribed for possible seasonal allergies. Advised patient to follow up with her PCP within the  next week. Discussed return precautions.  Final Clinical Impressions(s) / ED Diagnoses   Final diagnoses:  Shortness of breath    New Prescriptions New Prescriptions   No medications on file     Nona Dell, PA-C 05/19/16 [place], MD 05/21/16 [phone]

## 2016-05-22 NOTE — Telephone Encounter (Signed)
Left message requesting return call to [facility] re: patient call for results.

## 2016-05-23 ENCOUNTER — Telehealth: Payer: Self-pay

## 2016-05-23 NOTE — Telephone Encounter (Signed)
Pt was called on 11/01 and informed of chest x-ray.

## 2016-05-25 MED FILL — SERTRALINE HCL 100 MG TAB: 100 | 30 days supply | Qty: 30 | Fill #0

## 2016-06-20 ENCOUNTER — Telehealth: Payer: Self-pay | Admitting: Family Medicine

## 2016-06-20 NOTE — Telephone Encounter (Signed)
Patient Is needing a refill for zoloft. Patient said she lost medication

## 2016-06-21 MED ORDER — SERTRALINE HCL 100 MG PO TABS: 100.0000 mg | ORAL_TABLET | Freq: Every day | ORAL | 5 refills | Status: DC

## 2016-06-21 NOTE — Telephone Encounter (Signed)
Pt was called and informed of medication being refilled.

## 2016-06-21 NOTE — Telephone Encounter (Signed)
Please inform patient that Zoloft Rx sent to pharmacy

## 2016-06-27 MED FILL — SERTRALINE HCL 100 MG TAB: 100 | 30 days supply | Qty: 30 | Fill #0

## 2016-08-15 MED FILL — SERTRALINE HCL 100 MG TAB: 100 | 30 days supply | Qty: 30 | Fill #1

## 2016-09-21 ENCOUNTER — Ambulatory Visit (HOSPITAL_COMMUNITY)
Admission: EM | Admit: 2016-09-21 | Discharge: 2016-09-21 | Disposition: A | Discharge status: Home / Self Care | Payer: Medicaid Other | Attending: Family Medicine | Admitting: Family Medicine

## 2016-09-21 ENCOUNTER — Encounter (HOSPITAL_COMMUNITY): Payer: Self-pay | Admitting: Family Medicine

## 2016-09-21 DIAGNOSIS — R0982 Postnasal drip: Secondary | ICD-10-CM | POA: Diagnosis not present

## 2016-09-21 MED ORDER — IPRATROPIUM BROMIDE 0.03 % NA SOLN: 2.0000 | Freq: Two times a day (BID) | NASAL | 12 refills | Status: DC

## 2016-09-21 MED ORDER — GUAIFENESIN ER 600 MG PO TB12: 600.0000 mg | ORAL_TABLET | Freq: Two times a day (BID) | ORAL | 0 refills | Status: DC

## 2016-09-21 MED FILL — IPRATROPIUM 0.03% SPRAY: 0.03 | 30 days supply | Qty: 30 | Fill #0

## 2016-09-21 NOTE — Discharge Instructions (Signed)
Drink plenty of water.

## 2016-09-21 NOTE — ED Triage Notes (Signed)
Pt here fpr a couple of weeks with the sensation that when she lays down she cant breathe. sts similar to when she had PNA in the past. sts some coughing and sputum. sts she feels like her airway is blocked.

## 2016-09-21 NOTE — ED Provider Notes (Signed)
CSN: [id number]     Arrival date & time 09/21/16  1133 History   None    Chief Complaint  Patient presents with  . Shortness of Breath   (Consider location/radiation/quality/duration/timing/severity/associated sxs/prior Treatment) Onset: 2-3 weeks of SOB  Course Patient denies any cold symptoms. No fevers, chills, congestion. Patient indicates having a very mild cough off and on. Indicates having hx of acute bronchitis. Patient feels like she has "inhaled smoke."  Denies any worsening of SOB with exacerbation, constant "inhaled smoke" feeling the throat  Severity:   Worse with: Nothing   Better with: Nothing  No associated chest pain. Has hx of anxiety and panic attack. No recent panic attacks. No nausea or vomiting. No diaphoresis.  No palpations.   Symptoms Sputum:no  Fever: no  Shortness of breath:   Leg Swelling:no  Heart Burn or Reflux:yes, patient with constant burping. Patient does not taking anything for reflux.  Wheezing:No, denies any hx of asthma, patient denies being a smoker Post Nasal Drip: no   Red Flags  Weight Loss:  no Immunocompromised:  no  PMH Asthma or COPD: no  PMH of Smoking: no PMH of Cardiac issues : negative  Using ACEIs: no   No hx of HTN, HLD, T2DM       Past Medical History:  Diagnosis Date  . Anemia   . Anxiety   . Anxiety   . Panic attacks   . Vertigo    Past Surgical History:  Procedure Laterality Date  . NO PAST SURGERIES     Family History  Problem Relation Age of Onset  . Diabetes Mother   . Hypertension Mother   . Heart disease Father   . Seizures Father   . Seizures    . Heart failure    . Diabetes Sister    Social History  Substance Use Topics  . Smoking status: Never Smoker  . Smokeless tobacco: Never Used  . Alcohol use No     Comment: social   OB History    Gravida Para Term Preterm AB Living   3 3           SAB TAB Ectopic Multiple Live Births                 Review of Systems  Constitutional:  Negative for chills and fever.  HENT: Negative for congestion and postnasal drip.   Eyes: Negative for discharge and itching.  Respiratory: Positive for shortness of breath. Negative for cough.   Cardiovascular: Negative for chest pain, palpitations and leg swelling.  Gastrointestinal: Negative for abdominal distention and abdominal pain.  Musculoskeletal: Negative for arthralgias.  Skin: Negative for rash and wound.  Neurological: Negative for dizziness and headaches.  Hematological: Negative for adenopathy.    Allergies  Patient has no known allergies.  Home Medications   Prior to Admission medications   Medication Sig Start Date End Date Taking? Authorizing Provider  albuterol (PROVENTIL HFA;VENTOLIN HFA) 108 (90 Base) MCG/ACT inhaler Inhale 2 puffs into the lungs every 6 (six) hours as needed for wheezing or shortness of breath. 04/03/16   Tiffany Daneil Dan, PA-C  etonogestrel (IMPLANON) 68 MG IMPL implant Inject 1 each into the skin once.    Historical Provider, MD  guaiFENesin (MUCINEX) 600 MG 12 hr tablet Take 1 tablet (600 mg total) by mouth 2 (two) times daily. 09/21/16   Keyundra Fant Cletis Media, MD  ipratropium (ATROVENT) 0.03 % nasal spray Place 2 sprays into both nostrils every 12 (twelve)  hours. 09/21/16   Shaquana Buel Cletis Media, MD  LORazepam (ATIVAN) 1 MG tablet Take 1 mg by mouth every 8 (eight) hours as needed for anxiety.    Historical Provider, MD  omeprazole (PRILOSEC) 20 MG capsule Take 1 capsule (20 mg total) by mouth daily. 04/20/16   Josalyn Funches, MD  sertraline (ZOLOFT) 100 MG tablet Take 1 tablet (100 mg total) by mouth daily. 06/21/16   Josalyn Funches, MD  traZODone (DESYREL) 50 MG tablet Take 50 mg by mouth at bedtime.    Historical Provider, MD  venlafaxine (EFFEXOR) 75 MG tablet Take 75 mg by mouth 2 (two) times daily.    Historical Provider, MD  Vitamin D, Ergocalciferol, (DRISDOL) 50000 UNITS CAPS capsule Take 1 capsule (50,000 Units total) by mouth every 7 (seven)  days. 12/16/13   Lorayne Marek, MD   Meds Ordered and Administered this Visit  Medications - No data to display  BP 108/80   Pulse 87   Temp 98.6 F (37 C)   Resp 18   SpO2 98%  No data found.   Physical Exam  Constitutional: She is oriented to person, place, and time. She appears well-developed and well-nourished.  HENT:  Head: Normocephalic and atraumatic.  Eyes: EOM are normal. Pupils are equal, round, and reactive to light.  Neck: Normal range of motion. Neck supple.  Cardiovascular: Normal rate, regular rhythm and normal heart sounds.   Pulmonary/Chest: Effort normal and breath sounds normal.  Abdominal: Soft. Bowel sounds are normal.  Musculoskeletal: Normal range of motion.  Neurological: She is alert and oriented to person, place, and time.  Skin: Skin is warm and dry.    Urgent Care Course     Procedures (including critical care time)  Labs Review Labs Reviewed - No data to display  Imaging Review No results found.    MDM   1. Post-nasal drip    Patient presenting with throat irritation. Patient is well appearing, no acute distress. Reassured patient that this was likely post-nasal drip   Patient was seen by Dr. Juventino Slovak   Meds ordered this encounter  Medications  . DISCONTD: ipratropium (ATROVENT) 0.03 % nasal spray    Sig: Place 2 sprays into both nostrils every 12 (twelve) hours.    Dispense:  30 mL    Refill:  12  . DISCONTD: guaiFENesin (MUCINEX) 600 MG 12 hr tablet    Sig: Take 1 tablet (600 mg total) by mouth 2 (two) times daily.    Dispense:  30 tablet    Refill:  0  . guaiFENesin (MUCINEX) 600 MG 12 hr tablet    Sig: Take 1 tablet (600 mg total) by mouth 2 (two) times daily.    Dispense:  30 tablet    Refill:  0  . ipratropium (ATROVENT) 0.03 % nasal spray    Sig: Place 2 sprays into both nostrils every 12 (twelve) hours.    Dispense:  30 mL    Refill:  12      Jhoselin Crume Cletis Media, MD 09/21/16 1250

## 2016-09-27 MED FILL — SERTRALINE HCL 100 MG TAB: 100 | 30 days supply | Qty: 30 | Fill #2

## 2016-09-28 ENCOUNTER — Ambulatory Visit (HOSPITAL_COMMUNITY)
Admission: EM | Admit: 2016-09-28 | Discharge: 2016-09-28 | Disposition: A | Discharge status: Home / Self Care | Payer: Medicaid Other | Attending: Emergency Medicine | Admitting: Emergency Medicine

## 2016-09-28 ENCOUNTER — Encounter (HOSPITAL_COMMUNITY): Payer: Self-pay | Admitting: Emergency Medicine

## 2016-09-28 DIAGNOSIS — R609 Edema, unspecified: Secondary | ICD-10-CM

## 2016-09-28 DIAGNOSIS — Z1389 Encounter for screening for other disorder: Secondary | ICD-10-CM

## 2016-09-28 LAB — POCT I-STAT, CHEM 8
BUN: 9 mg/dL (ref 6–20)
CALCIUM ION: 1.16 mmol/L (ref 1.15–1.40)
CHLORIDE: 103 mmol/L (ref 101–111)
Creatinine, Ser: 0.7 mg/dL (ref 0.44–1.00)
Glucose, Bld: 98 mg/dL (ref 65–99)
HCT: 42 % (ref 36.0–46.0)
Hemoglobin: 14.3 g/dL (ref 12.0–15.0)
Potassium: 3.7 mmol/L (ref 3.5–5.1)
Sodium: 138 mmol/L (ref 135–145)
TCO2: 24 mmol/L (ref 0–100)

## 2016-09-28 LAB — POCT URINALYSIS DIP (DEVICE)
Bilirubin Urine: NEGATIVE
Glucose, UA: NEGATIVE mg/dL
Hgb urine dipstick: NEGATIVE
Ketones, ur: NEGATIVE mg/dL
NITRITE: NEGATIVE
Protein, ur: NEGATIVE mg/dL
SPECIFIC GRAVITY, URINE: 1.025 (ref 1.005–1.030)
UROBILINOGEN UA: 0.2 mg/dL (ref 0.0–1.0)
pH: 5.5 (ref 5.0–8.0)

## 2016-09-28 MED ORDER — FUROSEMIDE 20 MG PO TABS: 20.0000 mg | ORAL_TABLET | Freq: Every day | ORAL | 0 refills | Status: DC

## 2016-09-28 MED ORDER — POTASSIUM CHLORIDE ER 10 MEQ PO TBCR: 10.0000 meq | EXTENDED_RELEASE_TABLET | Freq: Every day | ORAL | 0 refills | Status: DC

## 2016-09-28 NOTE — ED Provider Notes (Signed)
HPI  SUBJECTIVE:  Breanna Thomas is a 36 y.o. female who presents with 5 days of bilateral ankle swelling. She states that both her ankles are swollen equally. She has increased her water recently and increased her walking, but she stopped doing this after her ankles started swelling 5 days ago. She tried elevation which slightly improved the swelling, symptoms are worse with walking and standing for prolonged periods of time. She states that her ankles and not really better in the morning or more swollen at the end of the day. She denies coughing, wheezing, chest pain, shortness of breath, unintentional weight pain. No PND, orthopnea, dyspnea on exertion, abdominal pain. States that she is urinating twice nightly and this is unusual for her. No calf pain, swelling. She states that she does eat a lot of restaurant food but does not add salt to her food. She has a past medical history panic attacks, asthma. She has an Nexplanon on for birth control. She is not a smoker. Past medical history negative for CHF, DVT, PE, peripheral vascular disease, coronary artery disease, hypercholesterolemia, diabetes, hypertension, kidney disease. LMP: 2 weeks ago. Denies possibility of being pregnant. PMD: Minerva Ends, MD   Past Medical History:  Diagnosis Date  . Anemia   . Anxiety   . Anxiety   . Panic attacks   . Vertigo     Past Surgical History:  Procedure Laterality Date  . NO PAST SURGERIES      Family History  Problem Relation Age of Onset  . Diabetes Mother   . Hypertension Mother   . Heart disease Father   . Seizures Father   . Seizures    . Heart failure    . Diabetes Sister     Social History  Substance Use Topics  . Smoking status: Never Smoker  . Smokeless tobacco: Never Used  . Alcohol use No     Comment: social    No current facility-administered medications for this encounter.   Current Outpatient Prescriptions:  .  sertraline (ZOLOFT) 100 MG tablet, Take 1  tablet (100 mg total) by mouth daily., Disp: 30 tablet, Rfl: 5 .  Vitamin D, Ergocalciferol, (DRISDOL) 50000 UNITS CAPS capsule, Take 1 capsule (50,000 Units total) by mouth every 7 (seven) days., Disp: 12 capsule, Rfl: 0 .  albuterol (PROVENTIL HFA;VENTOLIN HFA) 108 (90 Base) MCG/ACT inhaler, Inhale 2 puffs into the lungs every 6 (six) hours as needed for wheezing or shortness of breath., Disp: 1 Inhaler, Rfl: 0 .  etonogestrel (IMPLANON) 68 MG IMPL implant, Inject 1 each into the skin once., Disp: , Rfl:  .  furosemide (LASIX) 20 MG tablet, Take 1 tablet (20 mg total) by mouth daily., Disp: 10 tablet, Rfl: 0 .  guaiFENesin (MUCINEX) 600 MG 12 hr tablet, Take 1 tablet (600 mg total) by mouth 2 (two) times daily., Disp: 30 tablet, Rfl: 0 .  ipratropium (ATROVENT) 0.03 % nasal spray, Place 2 sprays into both nostrils every 12 (twelve) hours., Disp: 30 mL, Rfl: 12 .  LORazepam (ATIVAN) 1 MG tablet, Take 1 mg by mouth every 8 (eight) hours as needed for anxiety., Disp: , Rfl:  .  omeprazole (PRILOSEC) 20 MG capsule, Take 1 capsule (20 mg total) by mouth daily., Disp: 90 capsule, Rfl: 3 .  potassium chloride (K-DUR) 10 MEQ tablet, Take 1 tablet (10 mEq total) by mouth daily., Disp: 5 tablet, Rfl: 0 .  traZODone (DESYREL) 50 MG tablet, Take 50 mg by mouth at bedtime.,  Disp: , Rfl:  .  venlafaxine (EFFEXOR) 75 MG tablet, Take 75 mg by mouth 2 (two) times daily., Disp: , Rfl:   No Known Allergies   ROS  As noted in HPI.   Physical Exam  BP 121/84 (BP Location: Right Arm)   Pulse 94   Temp 98.3 F (36.8 C) (Oral)   Resp 19   LMP 09/14/2016 (Exact Date)   SpO2 93%   Constitutional: Well developed, well nourished, no acute distress Eyes:  EOMI, conjunctiva normal bilaterally HENT: Normocephalic, atraumatic,mucus membranes moist Respiratory: Normal inspiratory effort Cardiovascular: Normal rate GI: nondistended skin: No rash, skin intact Musculoskeletal: Calves symmetric, bilateral diffuse  calf tenderness in the right more so left area. No popliteal tenderness. 1+ pitting edema to the distal legs. Normal color. DP 2+ and equal bilaterally. Negative Homan's. Neurologic: Alert & oriented x 3, no focal neuro deficits Psychiatric: Speech and behavior appropriate   ED Course   Medications - No data to display  Orders Placed This Encounter  Procedures  . I-STAT, chem 8    Standing Status:   Standing    Number of Occurrences:   1  . POCT urinalysis dip (device)    Standing Status:   Standing    Number of Occurrences:   1    Results for orders placed or performed during the hospital encounter of 09/28/16 (from the past 24 hour(s))  I-STAT, chem 8     Status: None   Collection Time: 09/28/16  4:59 PM  Result Value Ref Range   Sodium 138 135 - 145 mmol/L   Potassium 3.7 3.5 - 5.1 mmol/L   Chloride 103 101 - 111 mmol/L   BUN 9 6 - 20 mg/dL   Creatinine, Ser 0.70 0.44 - 1.00 mg/dL   Glucose, Bld 98 65 - 99 mg/dL   Calcium, Ion 1.16 1.15 - 1.40 mmol/L   TCO2 24 0 - 100 mmol/L   Hemoglobin 14.3 12.0 - 15.0 g/dL   HCT 42.0 36.0 - 46.0 %  POCT urinalysis dip (device)     Status: Abnormal   Collection Time: 09/28/16  5:01 PM  Result Value Ref Range   Glucose, UA NEGATIVE NEGATIVE mg/dL   Bilirubin Urine NEGATIVE NEGATIVE   Ketones, ur NEGATIVE NEGATIVE mg/dL   Specific Gravity, Urine 1.025 1.005 - 1.030   Hgb urine dipstick NEGATIVE NEGATIVE   pH 5.5 5.0 - 8.0   Protein, ur NEGATIVE NEGATIVE mg/dL   Urobilinogen, UA 0.2 0.0 - 1.0 mg/dL   Nitrite NEGATIVE NEGATIVE   Leukocytes, UA MODERATE (A) NEGATIVE   No results found.  ED Clinical Impression  Peripheral edema   ED Assessment/Plan  We'll check kidney function to rule out kidney disease as cause of her symptoms, and the differential would be DVT given that she is on nexplanon, however, it would be unusual for her to have a bilateral DVT. Suspect that the calf Tenderness is from recent exercise. Discussed with  her that DVT is in the differential. Plan to check an i-STAT, UA. If this is normal, we'll send home with Lasix 20 mg one to 2 times a day for 5 days, KCl 10 mEq daily, or she is to start multivitamin while taking the Lasix to prevent hypokalemia. Also compression stockings, decrease salt in diet, elevation. If she does not get better, she will follow up with her primary care physician, Dr. Adrian Blackwater. She'll go to the ED for chest pain, shortness of breath or any concerns.  Urine:  Moderate leuks, otherwise negative. No protein. i-STAT: Normal electrolytes, kidney function.  Patient has no urinary symptoms. Denies dysuria, urgency, frequency, cloudy or odorous urine, hematuria. Will not treat this.  Plan as above  Discussed labs, MDM, plan and followup with patient. Discussed sn/sx that should prompt return to the ED. Patient agrees with plan.   Meds ordered this encounter  Medications  . furosemide (LASIX) 20 MG tablet    Sig: Take 1 tablet (20 mg total) by mouth daily.    Dispense:  10 tablet    Refill:  0  . potassium chloride (K-DUR) 10 MEQ tablet    Sig: Take 1 tablet (10 mEq total) by mouth daily.    Dispense:  5 tablet    Refill:  0    *This clinic note was created using [employer]. Therefore, there may be occasional mistakes despite careful proofreading.  ?   Melynda Ripple, MD 09/28/16 1725

## 2016-09-28 NOTE — ED Notes (Signed)
Water given to pt 

## 2016-09-28 NOTE — Discharge Instructions (Signed)
Compression hose, elevate your legs above your heart as much as possible. You may take either the potassium or take a multivitamin to help prevent low potassium levels. You do not need both. The Lasix will make you urinate out potassium. Start with one tab once a day, but you may try one tablet twice a day after checking with your doctor.

## 2016-09-28 NOTE — ED Triage Notes (Signed)
Pt stated both ankles are swollen, started Monday night, she stated she elevated her feet with no relief. Pt states she urinate frequently Pt denies pain. [place] Student

## 2016-10-30 MED FILL — SERTRALINE HCL 100 MG TAB: 100 | 30 days supply | Qty: 30 | Fill #3 | Status: TO

## 2016-12-05 ENCOUNTER — Encounter: Payer: Self-pay | Admitting: Family Medicine

## 2017-01-01 ENCOUNTER — Ambulatory Visit: Payer: Medicaid Other | Attending: Family Medicine | Admitting: *Deleted

## 2017-01-01 DIAGNOSIS — Z111 Encounter for screening for respiratory tuberculosis: Secondary | ICD-10-CM | POA: Insufficient documentation

## 2017-01-01 NOTE — Progress Notes (Signed)
PPD Placement note  Breanna Thomas, 36 y.o. female is here today for placement of PPD test Reason for PPD test: employment Pt taken PPD test before: yes Verified in allergy area and with patient that they are not allergic to the products PPD is made of (Phenol or Tween). Yes Is patient taking any oral or IV steroid medication now or have they taken it in the last month? no Has the patient ever received the BCG vaccine?: no Has the patient been in recent contact with anyone known or suspected of having active TB disease?: no        PPD placed on 01/01/2017.  Patient advised to return for reading within 48-72 hours.

## 2017-01-02 ENCOUNTER — Ambulatory Visit: Payer: Self-pay

## 2017-01-04 LAB — TB SKIN TEST
Induration: 0 mm
TB Skin Test: NEGATIVE

## 2017-02-04 ENCOUNTER — Ambulatory Visit: Payer: Self-pay | Admitting: Family Medicine

## 2017-02-12 ENCOUNTER — Ambulatory Visit: Payer: Self-pay | Admitting: Family Medicine

## 2017-02-12 NOTE — Progress Notes (Deleted)
   Subjective:  Patient ID: [name], female    DOB: 16-Sep-1980  Age: 36 y.o. MRN: [id number]  CC: No chief complaint on file.   HPI Charyl E Ruedas presents for ***  Outpatient Medications Prior to Visit  Medication Sig Dispense Refill  . albuterol (PROVENTIL HFA;VENTOLIN HFA) 108 (90 Base) MCG/ACT inhaler Inhale 2 puffs into the lungs every 6 (six) hours as needed for wheezing or shortness of breath. 1 Inhaler 0  . etonogestrel (IMPLANON) 68 MG IMPL implant Inject 1 each into the skin once.    . furosemide (LASIX) 20 MG tablet Take 1 tablet (20 mg total) by mouth daily. 10 tablet 0  . guaiFENesin (MUCINEX) 600 MG 12 hr tablet Take 1 tablet (600 mg total) by mouth 2 (two) times daily. 30 tablet 0  . ipratropium (ATROVENT) 0.03 % nasal spray Place 2 sprays into both nostrils every 12 (twelve) hours. 30 mL 12  . LORazepam (ATIVAN) 1 MG tablet Take 1 mg by mouth every 8 (eight) hours as needed for anxiety.    Marland Kitchen omeprazole (PRILOSEC) 20 MG capsule Take 1 capsule (20 mg total) by mouth daily. 90 capsule 3  . potassium chloride (K-DUR) 10 MEQ tablet Take 1 tablet (10 mEq total) by mouth daily. 5 tablet 0  . sertraline (ZOLOFT) 100 MG tablet Take 1 tablet (100 mg total) by mouth daily. 30 tablet 5  . traZODone (DESYREL) 50 MG tablet Take 50 mg by mouth at bedtime.    Marland Kitchen venlafaxine (EFFEXOR) 75 MG tablet Take 75 mg by mouth 2 (two) times daily.    . Vitamin D, Ergocalciferol, (DRISDOL) 50000 UNITS CAPS capsule Take 1 capsule (50,000 Units total) by mouth every 7 (seven) days. 12 capsule 0   No facility-administered medications prior to visit.     ROS Review of Systems      Objective:  There were no vitals taken for this visit.  BP/Weight 09/28/2016 09/21/2016 22/33/6122  Systolic BP 449 753 005  Diastolic BP 84 80 96  Wt. (Lbs) - - 260  BMI - - 44.63     Physical Exam   Assessment & Plan:   Problem List Items Addressed This Visit    None      No orders of the  defined types were placed in this encounter.   Follow-up: No Follow-up on file.   Alfonse Spruce FNP

## 2017-02-26 ENCOUNTER — Encounter: Payer: Self-pay | Admitting: Physician Assistant

## 2017-02-26 ENCOUNTER — Ambulatory Visit: Payer: Medicaid Other | Attending: Internal Medicine | Admitting: Physician Assistant

## 2017-02-26 VITALS — BP 107/75 | HR 89 | Temp 98.7°F | Resp 18 | Ht 63.0 in | Wt 259.0 lb

## 2017-02-26 DIAGNOSIS — R609 Edema, unspecified: Secondary | ICD-10-CM

## 2017-02-26 DIAGNOSIS — Z6841 Body Mass Index (BMI) 40.0 and over, adult: Secondary | ICD-10-CM | POA: Insufficient documentation

## 2017-02-26 DIAGNOSIS — R600 Localized edema: Secondary | ICD-10-CM | POA: Insufficient documentation

## 2017-02-26 NOTE — Progress Notes (Signed)
Breanna Thomas, is a 36 y.o. female  WGN:[id number]  HQI:[id number]  DOB - 06/16/1981  Subjective:  Chief Complaint and HPI: Breanna Thomas is a 36 y.o. female here today for 6-7 month h/o lower leg edema.  +worse at the end of the day.  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  Overall sedentary lifestyle.  No exercise.  Doesn't drink water.  Diet is poor.  Denies SOB/CP.  She was prescribed Lasix previously which didn't help her.  She says she took it for about 3 weeks.  Also wants to discuss weight loss.    ROS:   Constitutional:  No f/c, No night sweats, No unexplained weight loss. EENT:  No vision changes, No blurry vision, No hearing changes. No mouth, throat, or ear problems.  Respiratory: No cough, No SOB Cardiac: No CP, no palpitations GI:  No abd pain, No N/V/D. GU: No Urinary s/sx Musculoskeletal: No joint pain Neuro: No headache, no dizziness, no motor weakness.  Skin: No rash Endocrine:  No polydipsia. No polyuria.  Psych: Denies SI/HI  No problems updated.  ALLERGIES: No Known Allergies  PAST MEDICAL HISTORY: Past Medical History:  Diagnosis Date  . Anemia   . Anxiety   . Anxiety   . Panic attacks   . Vertigo     MEDICATIONS AT HOME: Prior to Admission medications   Medication Sig Start Date End Date Taking? Authorizing Provider  albuterol (PROVENTIL HFA;VENTOLIN HFA) 108 (90 Base) MCG/ACT inhaler Inhale 2 puffs into the lungs every 6 (six) hours as needed for wheezing or shortness of breath. 04/03/16  Yes Ena Dawley, Tiffany S, PA-C  etonogestrel (IMPLANON) 68 MG IMPL implant Inject 1 each into the skin once.   Yes [provider]  Vitamin D, Ergocalciferol, (DRISDOL) 50000 UNITS CAPS capsule Take 1 capsule (50,000 Units total) by mouth every 7 (seven) days. 12/16/13  Yes Advani, Vernon Prey, MD  furosemide (LASIX) 20 MG tablet Take 1 tablet (20 mg total) by mouth daily. Patient not taking: Reported on 02/26/2017 09/28/16   Melynda Ripple, MD  ipratropium  (ATROVENT) 0.03 % nasal spray Place 2 sprays into both nostrils every 12 (twelve) hours. Patient not taking: Reported on 02/26/2017 09/21/16   Tonette Bihari, MD     Objective:  EXAM:   Vitals:   02/26/17 1126  BP: 107/75  Pulse: 89  Resp: 18  Temp: 98.7 F (37.1 C)  TempSrc: Oral  SpO2: 99%  Weight: 259 lb (117.5 kg)  Height: 5\' 3"  (1.6 m)    General appearance : A&OX3. NAD. Non-toxic-appearing, morbidly obese HEENT: Atraumatic and Normocephalic.  PERRLA. EOM intact.   Neck: supple, no JVD. No cervical lymphadenopathy. No thyromegaly Chest/Lungs:  Breathing-non-labored, Good air entry bilaterally, breath sounds normal without rales, rhonchi, or wheezing  CVS: S1 S2 regular, no murmurs, gallops, rubs  Extremities: Bilateral Lower Ext shows mild 1+ edema, both legs are warm to touch with = pulse throughout.  Negative Homan's B, no calf erythema or asymmetry.   Neurology:  CN II-XII grossly intact, Non focal.   Psych:  TP linear. J/I WNL. Normal speech. Appropriate eye contact and affect.  Skin:  No Rash  Data Review No results found for: HGBA1C   Assessment & Plan   1. Edema, unspecified type Elevate legs in the evening.  Drink 80 ounces of water daily.  Healthy diet and physical activity are imperative.  TED stockings recommended.  She has some lasix left and can take 10 days worth to facilitate swelling  reduction.   - TSH - Vitamin D, [id number] - Basic metabolic panel  2. Morbid obesity with body mass index (BMI) of 40.0 to 44.9 in adult [facility]) Discussed proper exercise, healthy dietary choices, recommended OA/12 step support.  In addition recommended Mack book.    Spent 25 mins face to face discussing healthy lifestyle changes that will positively impact and possibly eradicate the problems she is having.     Patient have been counseled extensively about nutrition and exercise  Return in about 3 months (around 05/29/2017) for assign PCP; f/up  edema.  The patient was given clear instructions to go to ER or return to medical center if symptoms don't improve, worsen or new problems develop. The patient verbalized understanding. The patient was told to call to get lab results if they haven't heard anything in the next week.     Freeman Caldron, PA-C [facility] and [place], [place]   02/26/2017, 12:55 PM

## 2017-02-26 NOTE — Patient Instructions (Signed)
Edema Edema is when you have too much fluid in your body or under your skin. Edema may make your legs, feet, and ankles swell up. Swelling is also common in looser tissues, like around your eyes. This is a common condition. It gets more common as you get older. There are many possible causes of edema. Eating too much salt (sodium) and being on your feet or sitting for a long time can cause edema in your legs, feet, and ankles. Hot weather may make edema worse. Edema is usually painless. Your skin may look swollen or shiny. Follow these instructions at home:  Keep the swollen body part raised (elevated) above the level of your heart when you are sitting or lying down.  Do not sit still or stand for a long time.  Do not wear tight clothes. Do not wear garters on your upper legs.  Exercise your legs. This can help the swelling go down.  Wear elastic bandages or support stockings as told by your doctor.  Eat a low-salt (low-sodium) diet to reduce fluid as told by your doctor.  Depending on the cause of your swelling, you may need to limit how much fluid you drink (fluid restriction).  Take over-the-counter and prescription medicines only as told by your doctor. Contact a doctor if:  Treatment is not working.  You have heart, liver, or kidney disease and have symptoms of edema.  You have sudden and unexplained weight gain. Get help right away if:  You have shortness of breath or chest pain.  You cannot breathe when you lie down.  You have pain, redness, or warmth in the swollen areas.  You have heart, liver, or kidney disease and get edema all of a sudden.  You have a fever and your symptoms get worse all of a sudden. Summary  Edema is when you have too much fluid in your body or under your skin.  Edema may make your legs, feet, and ankles swell up. Swelling is also common in looser tissues, like around your eyes.  Raise (elevate) the swollen body part above the level of your  heart when you are sitting or lying down.  Follow your doctor's instructions about diet and how much fluid you can drink (fluid restriction). This information is not intended to replace advice given to you by your health care provider. Make sure you discuss any questions you have with your health care provider. Document Released: 12/26/2007 Document Revised: 07/27/2016 Document Reviewed: 07/27/2016 Elsevier Interactive Patient Education  2017 Elsevier Inc.  

## 2017-02-27 ENCOUNTER — Other Ambulatory Visit: Payer: Self-pay | Admitting: Physician Assistant

## 2017-02-27 DIAGNOSIS — E559 Vitamin D deficiency, unspecified: Secondary | ICD-10-CM

## 2017-02-27 LAB — VITAMIN D 25 HYDROXY (VIT D DEFICIENCY, FRACTURES): Vit D, 25-Hydroxy: 23.6 ng/mL — ABNORMAL LOW (ref 30.0–100.0)

## 2017-02-27 LAB — BASIC METABOLIC PANEL
BUN/Creatinine Ratio: 13 (ref 9–23)
BUN: 9 mg/dL (ref 6–20)
CO2: 18 mmol/L — ABNORMAL LOW (ref 20–29)
Calcium: 9.6 mg/dL (ref 8.7–10.2)
Chloride: 106 mmol/L (ref 96–106)
Creatinine, Ser: 0.7 mg/dL (ref 0.57–1.00)
GFR, EST AFRICAN AMERICAN: 130 mL/min/{1.73_m2} (ref >59)
GFR, EST NON AFRICAN AMERICAN: 113 mL/min/{1.73_m2} (ref >59)
Glucose: 98 mg/dL (ref 65–99)
Potassium: 4.1 mmol/L (ref 3.5–5.2)
Sodium: 140 mmol/L (ref 134–144)

## 2017-02-27 LAB — TSH: TSH: 1.79 u[IU]/mL (ref 0.450–4.500)

## 2017-02-27 MED ORDER — VITAMIN D (ERGOCALCIFEROL) 1.25 MG (50000 UNIT) PO CAPS: 50000.0000 [IU] | ORAL_CAPSULE | ORAL | 0 refills | Status: DC

## 2017-02-27 MED FILL — VIT D2 1.25 MG (50,000 UNIT: 1.25 MG | 28 days supply | Qty: 4 | Fill #0

## 2017-03-01 ENCOUNTER — Telehealth: Payer: Self-pay

## 2017-03-01 NOTE — Telephone Encounter (Signed)
Contacted pt to go over lab results pt didn't answer lvm asking pt to give me a call at her earliest convenience   If pt calls back please give results: vitamin D is low. This can contribute to muscle aches, anxiety, fatigue, and depression. I have sent a prescription to the pharmacy for them to take once a week. We will recheck this level in 3-4 months. Her thyroid function, kidney function, electrolytes, and blood sugar were normal.

## 2017-03-04 ENCOUNTER — Telehealth: Payer: Self-pay | Admitting: Family Medicine

## 2017-03-04 NOTE — Telephone Encounter (Signed)
Patient returned nurse's call to find out her lab results. Informed patient what was written on Epic:  "If pt calls back please give results: vitamin D is low. This can contribute to muscle aches, anxiety, fatigue, and depression. I have sent a prescription to the pharmacy for them to take once a week. We will recheck this level in 3-4 months. Her thyroid function, kidney function, electrolytes, and blood sugar were normal.  Patient understood and asked if her prescription was sent [facility] pharmacy. Asked patient to call tomorrow to check if her prescription was ready for pick up or not. Pt had no further questions.

## 2017-03-12 ENCOUNTER — Ambulatory Visit: Payer: Self-pay | Admitting: Family Medicine

## 2017-05-07 ENCOUNTER — Encounter (HOSPITAL_COMMUNITY): Payer: Self-pay | Admitting: Emergency Medicine

## 2017-05-07 ENCOUNTER — Ambulatory Visit (HOSPITAL_COMMUNITY)
Admission: EM | Admit: 2017-05-07 | Discharge: 2017-05-07 | Disposition: A | Discharge status: Home / Self Care | Payer: Medicaid Other | Attending: Family Medicine | Admitting: Family Medicine

## 2017-05-07 DIAGNOSIS — J069 Acute upper respiratory infection, unspecified: Secondary | ICD-10-CM

## 2017-05-07 NOTE — Discharge Instructions (Signed)
Continue to push fluids, practice good hand hygiene, and cover your mouth if you cough.  If you start having fevers, shaking or shortness of breath, seek immediate care.  

## 2017-05-07 NOTE — ED Provider Notes (Signed)
[facility]    CSN: [id number] Arrival date & time: 05/07/17  1403     History   Chief Complaint Chief Complaint  Patient presents with  . Chest Pain  . URI    HPI Breanna Thomas is a 36 y.o. female.   HPI  Duration: 4 days  Associated symptoms: sinus congestion, rhinorrhea, sore throat and chest tightness Denies: sinus pain, itchy watery eyes, ear pain, ear drainage, wheezing, shortness of breath and fevers/rigors Treatment to date: None Sick contacts: Yes- daughter got sick, she is now feeling better    Past Medical History:  Diagnosis Date  . Anemia   . Anxiety   . Panic attacks   . Vertigo     Patient Active Problem List   Diagnosis Date Noted  . GERD (gastroesophageal reflux disease) 04/20/2016  . Morbid obesity with body mass index (BMI) of 40.0 to 44.9 in adult [facility]) 04/20/2016  . Cough 04/20/2016  . IFG (impaired fasting glucose) 12/30/2013  . Unspecified vitamin D deficiency 12/30/2013  . Back muscle spasm 12/15/2013  . Anxiety 04/10/2013    Past Surgical History:  Procedure Laterality Date  . NO PAST SURGERIES      OB History    Gravida Para Term Preterm AB Living   3 3           SAB TAB Ectopic Multiple Live Births                   Home Medications    Prior to Admission medications   Medication Sig Start Date End Date Taking? Authorizing Provider  Vitamin D, Ergocalciferol, (DRISDOL) 50000 units CAPS capsule Take 1 capsule (50,000 Units total) by mouth every 7 (seven) days. 02/27/17  Yes Argentina Donovan, PA-C  etonogestrel (IMPLANON) 68 MG IMPL implant Inject 1 each into the skin once.    [provider]    Family History Family History  Problem Relation Age of Onset  . Diabetes Mother   . Hypertension Mother   . Heart disease Father   . Seizures Father   . Seizures Unknown   . Heart failure Unknown   . Diabetes Sister     Social History Social History  Substance Use Topics  . Smoking status:  Never Smoker  . Smokeless tobacco: Never Used  . Alcohol use 0.6 oz/week    1 Glasses of wine per week     Comment: social     Allergies   Patient has no known allergies.   Review of Systems Review of Systems  Constitutional: Negative for fever.     Physical Exam Triage Vital Signs ED Triage Vitals [05/07/17 1435]  Enc Vitals Group     BP 106/77     Pulse Rate 78     Resp 16     Temp 99.4 F (37.4 C)     Temp Source Oral     SpO2 100 %     Weight 250 lb (113.4 kg)     Height 5\' 4"  (1.626 m)   Updated Vital Signs BP 106/77   Pulse 78   Temp 99.4 F (37.4 C) (Oral)   Resp 16   Ht 5\' 4"  (1.626 m)   Wt 250 lb (113.4 kg)   SpO2 100%   BMI 42.91 kg/m   Physical Exam  Constitutional: She appears well-developed and well-nourished.  HENT:  Head: Normocephalic.  Right Ear: External ear normal.  Left Ear: External ear normal.  Nose: Nose  normal.  Mouth/Throat: Oropharynx is clear and moist. No oropharyngeal exudate.  Eyes: Pupils are equal, round, and reactive to light. EOM are normal.  Neck: Normal range of motion. Neck supple.  Cardiovascular: Normal rate and regular rhythm.   No murmur heard. Pulmonary/Chest: Effort normal and breath sounds normal. No respiratory distress.  Skin: Skin is warm.  Psychiatric: She has a normal mood and affect. Judgment normal.     UC Treatments / Results  Procedures Procedures none  Initial Impression / Assessment and Plan / UC Course  I have reviewed the triage vital signs and the nursing notes.  Pertinent labs & imaging results that were available during my care of the patient were reviewed by me and considered in my medical decision making (see chart for details).     Pt presents with URI symptoms. Supportive care. Letter for work excusing for today given. F/u with PCP prn. Pt voiced understanding and agreement to plan.  Final Clinical Impressions(s) / UC Diagnoses   Final diagnoses:  Viral URI    New  Prescriptions Current Discharge Medication List       Controlled Substance Prescriptions [place] Controlled Substance Registry consulted? Not Applicable   Shelda Pal, [place] 05/07/17 1454

## 2017-05-25 ENCOUNTER — Encounter (HOSPITAL_COMMUNITY): Payer: Self-pay | Admitting: Emergency Medicine

## 2017-05-25 ENCOUNTER — Ambulatory Visit (HOSPITAL_COMMUNITY)
Admission: EM | Admit: 2017-05-25 | Discharge: 2017-05-25 | Disposition: A | Discharge status: Home / Self Care | Payer: Medicaid Other | Attending: Family Medicine | Admitting: Family Medicine

## 2017-05-25 DIAGNOSIS — K219 Gastro-esophageal reflux disease without esophagitis: Secondary | ICD-10-CM | POA: Diagnosis not present

## 2017-05-25 DIAGNOSIS — R0982 Postnasal drip: Secondary | ICD-10-CM | POA: Diagnosis not present

## 2017-05-25 MED ORDER — OMEPRAZOLE 20 MG PO CPDR: 20.0000 mg | DELAYED_RELEASE_CAPSULE | Freq: Every day | ORAL | 0 refills | Status: DC

## 2017-05-25 MED ORDER — GUAIFENESIN ER 600 MG PO TB12: 600.0000 mg | ORAL_TABLET | Freq: Two times a day (BID) | ORAL | 0 refills | Status: DC

## 2017-05-25 NOTE — ED Triage Notes (Signed)
Symptoms for 2 weeks.  Reports when coughing up phlegm, feels phlegm gets lodged in throat.   Reports this is worse at night time.  Denies sinus drainage, but has had a stuffy nose.

## 2017-05-25 NOTE — ED Provider Notes (Signed)
[place]    CSN: [id number] Arrival date & time: 05/25/17  1408     History   Chief Complaint Chief Complaint  Patient presents with  . Wheezing    HPI Breanna Thomas is a 36 y.o. female.   Breanna Thomas presents with complaints of sensation of phlegm in her throat which has been ongoing for the past two weeks. It is worse at night but occurs off and on throughout the day. She is uncertain if is worse after eating. She sporadically coughs, and feels that she needs to clear her throat. She states she had a URI, was seen 10/13 for this, but those symptoms have improved. Denies runny nose, ear pain or sore throat. Without chest pain, shortness of breath. She states at times it feels like a wheezing sensation but she does not hear wheezes. Has not taken any medications for her symptoms, is not taking any medications currently.    ROS per HPI.       Past Medical History:  Diagnosis Date  . Anemia   . Anxiety   . Anxiety   . Panic attacks   . Vertigo     Patient Active Problem List   Diagnosis Date Noted  . GERD (gastroesophageal reflux disease) 04/20/2016  . Morbid obesity with body mass index (BMI) of 40.0 to 44.9 in adult [facility]) 04/20/2016  . Cough 04/20/2016  . IFG (impaired fasting glucose) 12/30/2013  . Unspecified vitamin D deficiency 12/30/2013  . Back muscle spasm 12/15/2013  . Anxiety 04/10/2013    Past Surgical History:  Procedure Laterality Date  . NO PAST SURGERIES      OB History    Gravida Para Term Preterm AB Living   3 3           SAB TAB Ectopic Multiple Live Births                   Home Medications    Prior to Admission medications   Medication Sig Start Date End Date Taking? Authorizing Provider  Multiple Vitamin (MULTIVITAMIN) tablet Take 1 tablet by mouth daily.   Yes [provider]  etonogestrel (IMPLANON) 68 MG IMPL implant Inject 1 each into the skin once.    [provider]  guaiFENesin (MUCINEX)  600 MG 12 hr tablet Take 1 tablet (600 mg total) by mouth 2 (two) times daily. 05/25/17   Zigmund Gottron, NP  omeprazole (PRILOSEC) 20 MG capsule Take 1 capsule (20 mg total) by mouth daily. 05/25/17   Zigmund Gottron, NP  Vitamin D, Ergocalciferol, (DRISDOL) 50000 units CAPS capsule Take 1 capsule (50,000 Units total) by mouth every 7 (seven) days. 02/27/17   Argentina Donovan, PA-C    Family History Family History  Problem Relation Age of Onset  . Diabetes Mother   . Hypertension Mother   . Heart disease Father   . Seizures Father   . Seizures Unknown   . Heart failure Unknown   . Diabetes Sister     Social History Social History  Substance Use Topics  . Smoking status: Never Smoker  . Smokeless tobacco: Never Used  . Alcohol use 0.6 oz/week    1 Glasses of wine per week     Comment: social     Allergies   Patient has no known allergies.   Review of Systems Review of Systems   Physical Exam Triage Vital Signs ED Triage Vitals  Enc Vitals Group     BP  05/25/17 1422 114/69     Pulse Rate 05/25/17 1422 92     Resp 05/25/17 1422 (!) 22     Temp 05/25/17 1422 98.5 F (36.9 C)     Temp Source 05/25/17 1422 Oral     SpO2 05/25/17 1422 99 %     Weight --      Height --      Head Circumference --      Peak Flow --      Pain Score 05/25/17 1420 8     Pain Loc --      Pain Edu? --      Excl. in [place]? --    No data found.   Updated Vital Signs BP 114/69 (BP Location: Left Arm) Comment (BP Location): large cuff  Pulse 92   Temp 98.5 F (36.9 C) (Oral)   Resp (!) 22   SpO2 99%   Visual Acuity Right Eye Distance:   Left Eye Distance:   Bilateral Distance:    Right Eye Near:   Left Eye Near:    Bilateral Near:     Physical Exam  Constitutional: She is oriented to person, place, and time. She appears well-developed and well-nourished. No distress.  HENT:  Head: Normocephalic and atraumatic.  Right Ear: Tympanic membrane, external ear and ear canal normal.   Left Ear: Tympanic membrane, external ear and ear canal normal.  Nose: Nose normal.  Mouth/Throat: Uvula is midline, oropharynx is clear and moist and mucous membranes are normal. No tonsillar exudate.  Without post nasal drip visualized at this time  Eyes: Pupils are equal, round, and reactive to light. Conjunctivae and EOM are normal.  Neck: Normal range of motion.  Cardiovascular: Normal rate, regular rhythm and normal heart sounds.   Pulmonary/Chest: Effort normal and breath sounds normal.  Abdominal: Soft. There is no tenderness.  Neurological: She is alert and oriented to person, place, and time.  Skin: Skin is warm and dry.     UC Treatments / Results  Labs (all labs ordered are listed, but only abnormal results are displayed) Labs Reviewed - No data to display  EKG  EKG Interpretation None       Radiology No results found.  Procedures Procedures (including critical care time)  Medications Ordered in UC Medications - No data to display   Initial Impression / Assessment and Plan / UC Course  I have reviewed the triage vital signs and the nursing notes.  Pertinent labs & imaging results that were available during my care of the patient were reviewed by me and considered in my medical decision making (see chart for details).     Without acute findings on exam. Discussed gerd vs postnasal drip as source of discomfort. Recommended daily omeprazole as well as mucinex and/or zyrtec to see if symptoms improve. If symptoms worsen or do not improve in the next week to return to be seen or to follow up with PCP. Patient verbalized understanding and agreeable to plan.    Final Clinical Impressions(s) / UC Diagnoses   Final diagnoses:  Gastroesophageal reflux disease, esophagitis presence not specified  Postnasal drip    New Prescriptions New Prescriptions   GUAIFENESIN (MUCINEX) 600 MG 12 HR TABLET    Take 1 tablet (600 mg total) by mouth 2 (two) times daily.    OMEPRAZOLE (PRILOSEC) 20 MG CAPSULE    Take 1 capsule (20 mg total) by mouth daily.     Controlled Substance Prescriptions [place] Controlled Substance Registry consulted?  Not Applicable   Zigmund Gottron, NP 05/25/17 1453

## 2017-05-25 NOTE — Discharge Instructions (Signed)
Reflux vs post nasal drip. You may try daily reflux medication to see if your symptoms improve. Mucinex and/or zyrtec may also be helpful and worth trying. Ensure adequate fluid intake. If symptoms worsen or do not improve please follow up with your primary care provider

## 2017-05-27 DIAGNOSIS — D1720 Benign lipomatous neoplasm of skin and subcutaneous tissue of unspecified limb: Secondary | ICD-10-CM | POA: Diagnosis not present

## 2017-06-07 ENCOUNTER — Ambulatory Visit: Payer: Self-pay | Admitting: Family Medicine

## 2017-06-28 ENCOUNTER — Ambulatory Visit: Payer: Self-pay | Admitting: Family Medicine

## 2017-06-28 ENCOUNTER — Encounter: Payer: Self-pay | Admitting: Family Medicine

## 2017-06-28 ENCOUNTER — Ambulatory Visit: Payer: Medicaid Other | Attending: Family Medicine | Admitting: Family Medicine

## 2017-06-28 ENCOUNTER — Other Ambulatory Visit: Payer: Self-pay | Admitting: Family Medicine

## 2017-06-28 VITALS — BP 117/77 | HR 102 | Temp 98.5°F | Resp 18 | Ht 64.0 in | Wt 252.0 lb

## 2017-06-28 DIAGNOSIS — Z23 Encounter for immunization: Secondary | ICD-10-CM

## 2017-06-28 DIAGNOSIS — E559 Vitamin D deficiency, unspecified: Secondary | ICD-10-CM | POA: Insufficient documentation

## 2017-06-28 DIAGNOSIS — K219 Gastro-esophageal reflux disease without esophagitis: Secondary | ICD-10-CM | POA: Insufficient documentation

## 2017-06-28 DIAGNOSIS — R21 Rash and other nonspecific skin eruption: Secondary | ICD-10-CM | POA: Diagnosis present

## 2017-06-28 DIAGNOSIS — F418 Other specified anxiety disorders: Secondary | ICD-10-CM

## 2017-06-28 DIAGNOSIS — F410 Panic disorder [episodic paroxysmal anxiety] without agoraphobia: Secondary | ICD-10-CM | POA: Insufficient documentation

## 2017-06-28 DIAGNOSIS — F329 Major depressive disorder, single episode, unspecified: Secondary | ICD-10-CM | POA: Diagnosis not present

## 2017-06-28 DIAGNOSIS — Z8639 Personal history of other endocrine, nutritional and metabolic disease: Secondary | ICD-10-CM

## 2017-06-28 DIAGNOSIS — B372 Candidiasis of skin and nail: Secondary | ICD-10-CM | POA: Diagnosis not present

## 2017-06-28 MED ORDER — KETOCONAZOLE 2 % EX CREA: 1.0000 "application " | TOPICAL_CREAM | Freq: Every day | CUTANEOUS | 0 refills | Status: DC

## 2017-06-28 MED ORDER — OMEPRAZOLE 20 MG PO CPDR: 20.0000 mg | DELAYED_RELEASE_CAPSULE | Freq: Every day | ORAL | 2 refills | Status: DC

## 2017-06-28 MED ORDER — SERTRALINE HCL 100 MG PO TABS: ORAL_TABLET | ORAL | 0 refills | Status: DC

## 2017-06-28 MED FILL — SERTRALINE HCL 100 MG TAB: 100 | 30 days supply | Qty: 45 | Fill #0

## 2017-06-28 MED FILL — OMEPRAZOLE DR 20 MG CAPSULE: 20 | 30 days supply | Qty: 30 | Fill #0

## 2017-06-28 NOTE — Patient Instructions (Signed)
Food Choices for Gastroesophageal Reflux Disease, Adult When you have gastroesophageal reflux disease (GERD), the foods you eat and your eating habits are very important. Choosing the right foods can help ease your discomfort. What guidelines do I need to follow?  Choose fruits, vegetables, whole grains, and low-fat dairy products.  Choose low-fat meat, fish, and poultry.  Limit fats such as oils, salad dressings, butter, nuts, and avocado.  Keep a food diary. This helps you identify foods that cause symptoms.  Avoid foods that cause symptoms. These may be different for everyone.  Eat small meals often instead of 3 large meals a day.  Eat your meals slowly, in a place where you are relaxed.  Limit fried foods.  Cook foods using methods other than frying.  Avoid drinking alcohol.  Avoid drinking large amounts of liquids with your meals.  Avoid bending over or lying down until 2-3 hours after eating. What foods are not recommended? These are some foods and drinks that may make your symptoms worse: Vegetables  Tomatoes. Tomato juice. Tomato and spaghetti sauce. Chili peppers. Onion and garlic. Horseradish. Fruits  Oranges, grapefruit, and lemon (fruit and juice). Meats  High-fat meats, fish, and poultry. This includes hot dogs, ribs, ham, sausage, salami, and bacon. Dairy  Whole milk and chocolate milk. Sour cream. Cream. Butter. Ice cream. Cream cheese. Drinks  Coffee and tea. Bubbly (carbonated) drinks or energy drinks. Condiments  Hot sauce. Barbecue sauce. Sweets/Desserts  Chocolate and cocoa. Donuts. Peppermint and spearmint. Fats and Oils  High-fat foods. This includes French fries and potato chips. Other  Vinegar. Strong spices. This includes black pepper, white pepper, red pepper, cayenne, curry powder, cloves, ginger, and chili powder. The items listed above may not be a complete list of foods and drinks to avoid. Contact your dietitian for more information.    This information is not intended to replace advice given to you by your health care provider. Make sure you discuss any questions you have with your health care provider. Document Released: 01/08/2012 Document Revised: 12/15/2015 Document Reviewed: 05/13/2013 Elsevier Interactive Patient Education  2017 Elsevier Inc.  

## 2017-06-28 NOTE — Progress Notes (Signed)
Subjective:  Patient ID: Breanna Thomas, female    DOB: 11/17/1980  Age: 36 y.o. MRN: [id number]  CC: Establish Care   HPI Breanna Thomas presents to establish care.  She complains of rash underneath bilateral areas of breasts.  Onset 1 month ago.  Associated symptoms include itching, peeling, excoriation.  She reports using Neosporin and baby powder for symptoms with minimal relief.  History of anxiety, depression, and panic attack.  Onset of symptoms 5 years ago.  She reports discontinuing medication in April due to symptoms being stabilized, however restarted medication in October due to episode of depression with excessive crying.  She reports being managed by therapist outpatient.  She is requesting medication refill of Zoloft.  History of vitamin D deficiency she reports completing vitamin D course.   Outpatient Medications Prior to Visit  Medication Sig Dispense Refill  . etonogestrel (IMPLANON) 68 MG IMPL implant Inject 1 each into the skin once.    . Multiple Vitamin (MULTIVITAMIN) tablet Take 1 tablet by mouth daily.    Marland Kitchen omeprazole (PRILOSEC) 20 MG capsule Take 1 capsule (20 mg total) by mouth daily. 30 capsule 0  . sertraline (ZOLOFT) 100 MG tablet Take 1 tablet by mouth daily.  2  . Vitamin D, Ergocalciferol, (DRISDOL) 50000 units CAPS capsule Take 1 capsule (50,000 Units total) by mouth every 7 (seven) days. 12 capsule 0  . guaiFENesin (MUCINEX) 600 MG 12 hr tablet Take 1 tablet (600 mg total) by mouth 2 (two) times daily. (Patient not taking: Reported on 06/28/2017) 20 tablet 0   No facility-administered medications prior to visit.     ROS Review of Systems  Constitutional: Negative.   Respiratory: Negative.   Cardiovascular: Negative.   Skin: Positive for rash.  Psychiatric/Behavioral: Negative for suicidal ideas. Decreased concentration: history of depression. Nervous/anxious: history of anxiety.     Objective:  BP 117/77 (BP Location: Left Arm, Patient  Position: Sitting, Cuff Size: Normal)   Pulse (!) 102   Temp 98.5 F (36.9 C) (Oral)   Resp 18   Ht 5\' 4"  (1.626 m)   Wt 252 lb (114.3 kg)   SpO2 97%   BMI 43.26 kg/m   BP/Weight 06/28/2017 05/25/2017 41/32/4401  Systolic BP 027 253 664  Diastolic BP 77 69 77  Wt. (Lbs) 252 - 250  BMI 43.26 - 42.91     Physical Exam  Constitutional: She appears well-developed and well-nourished.  Eyes: Conjunctivae are normal. Pupils are equal, round, and reactive to light.  Neck: No JVD present.  Cardiovascular: Normal rate, regular rhythm, normal heart sounds and intact distal pulses.  Pulmonary/Chest: Effort normal and breath sounds normal.  Abdominal: Soft. Bowel sounds are normal. There is no tenderness.  Skin: Skin is warm and dry. Rash (bilateral  intertriginous areas undereneath breasts) noted.  Psychiatric: She expresses no homicidal and no suicidal ideation. She expresses no suicidal plans and no homicidal plans.  Nursing note and vitals reviewed.   Assessment & Plan:   1. Depression with anxiety Medication refill. - sertraline (ZOLOFT) 100 MG tablet; TAKE ONE AND A HALF TABLETS (150 MG TOTAL)  BY MOUTH DAILY.  Dispense: 60 tablet; Refill: 0  2. Intertriginous candidiasis  - ketoconazole (NIZORAL) 2 % cream; Apply 1 application topically daily. Apply to affected areas for 14 days.  Dispense: 15 g; Refill: 0  3. Gastroesophageal reflux disease, esophagitis presence not specified  - omeprazole (PRILOSEC) 20 MG capsule; Take 1 capsule (20 mg total) by mouth daily.  Dispense: 30 capsule; Refill: 2  4. History of vitamin D deficiency  - Vitamin D, 25-hydroxy  5. Needs flu shot  - Flu Vaccine QUAD 6+ mos PF IM (Fluarix Quad PF)      Follow-up: Return in about 2 weeks (around 07/12/2017) for Physical exam.   Alfonse Spruce FNP

## 2017-06-28 NOTE — Progress Notes (Signed)
Patient  is here for establish care

## 2017-06-29 LAB — VITAMIN D 25 HYDROXY (VIT D DEFICIENCY, FRACTURES): Vit D, 25-Hydroxy: 32.4 ng/mL (ref 30.0–100.0)

## 2017-07-12 ENCOUNTER — Telehealth: Payer: Self-pay | Admitting: *Deleted

## 2017-07-12 NOTE — Telephone Encounter (Signed)
Patient verified DOB Patient is aware of vitamin d level being normal and to continue with OTC vitamin d. Patient takes Vitamin D3 everyday. No further questions.

## 2017-07-12 NOTE — Telephone Encounter (Signed)
-----   Message from Alfonse Spruce, [place] sent at 07/11/2017 12:09 PM EST ----- Vitamin D level is normal. Recommend OTC vitamin d supplement with 400 IU to maintain vitamin levels.

## 2017-07-18 ENCOUNTER — Encounter: Payer: Self-pay | Admitting: Family Medicine

## 2017-08-05 MED FILL — SERTRALINE HCL 100 MG TAB: 100 | 10 days supply | Qty: 15 | Fill #1

## 2017-08-28 ENCOUNTER — Telehealth: Payer: Self-pay | Admitting: Family Medicine

## 2017-08-28 DIAGNOSIS — F418 Other specified anxiety disorders: Secondary | ICD-10-CM

## 2017-08-28 DIAGNOSIS — R05 Cough: Secondary | ICD-10-CM | POA: Diagnosis not present

## 2017-08-28 MED ORDER — SERTRALINE HCL 100 MG PO TABS: ORAL_TABLET | ORAL | 0 refills | Status: DC

## 2017-08-28 NOTE — Telephone Encounter (Signed)
Pt called to get a refill for sertraline (ZOLOFT) 100 MG tablet Please sent to to [facility] on [address] Please follow up

## 2017-08-28 NOTE — Telephone Encounter (Signed)
Refilled x 30 days, needs office visit

## 2017-10-15 ENCOUNTER — Other Ambulatory Visit: Payer: Self-pay

## 2017-10-15 ENCOUNTER — Ambulatory Visit (HOSPITAL_COMMUNITY)
Admission: EM | Admit: 2017-10-15 | Discharge: 2017-10-15 | Disposition: A | Discharge status: Home / Self Care | Payer: Medicaid Other | Attending: Family Medicine | Admitting: Family Medicine

## 2017-10-15 ENCOUNTER — Encounter (HOSPITAL_COMMUNITY): Payer: Self-pay | Admitting: Emergency Medicine

## 2017-10-15 DIAGNOSIS — K0889 Other specified disorders of teeth and supporting structures: Secondary | ICD-10-CM | POA: Diagnosis not present

## 2017-10-15 MED ORDER — HYDROCODONE-ACETAMINOPHEN 5-325 MG PO TABS: 1.0000 | ORAL_TABLET | Freq: Four times a day (QID) | ORAL | 0 refills | Status: DC | PRN

## 2017-10-15 MED ORDER — CLINDAMYCIN HCL 300 MG PO CAPS: 300.0000 mg | ORAL_CAPSULE | Freq: Three times a day (TID) | ORAL | 0 refills | Status: DC

## 2017-10-15 NOTE — ED Triage Notes (Signed)
Right, upper tooth abscess and pain for 4-5 weeks. Took pcn vk as directed-- went away for a night, then the next morning symptoms returned

## 2017-10-15 NOTE — Discharge Instructions (Addendum)
Be aware, pain medications may cause drowsiness. Please do not drive, operate heavy machinery or make important decisions while on this medication, it can cloud your judgement.  

## 2017-10-16 NOTE — ED Provider Notes (Signed)
[place]   [id number] 10/15/17 Arrival Time: 1324  ASSESSMENT & PLAN:  1. Pain, dental    Meds ordered this encounter  Medications  . clindamycin (CLEOCIN) 300 MG capsule    Sig: Take 1 capsule (300 mg total) by mouth 3 (three) times daily.    Dispense:  30 capsule    Refill:  0  . HYDROcodone-acetaminophen (NORCO/VICODIN) 5-325 MG tablet    Sig: Take 1 tablet by mouth every 6 (six) hours as needed for moderate pain or severe pain.    Dispense:  8 tablet    Refill:  0   [place] Controlled Substances Registry consulted for this patient. I feel the risk/benefit ratio today is favorable for proceeding with this prescription for a controlled substance. Medication sedation precautions given.  She will schedule dental evaluation as soon as possible.  Reviewed expectations re: course of current medical issues. Questions answered. Outlined signs and symptoms indicating need for more acute intervention. Patient verbalized understanding. After Visit Summary given.   SUBJECTIVE:  Breanna Thomas is a 38 y.o. female who reports gradual onset of right upper dental pain described as aching. Present for several weeks but worse over the past few days. Afebrile. Tolerating PO intake but reports pain with chewing. Normal swallowing. She has a Pharmacist, community but could not get in today. No neck swelling or pain. OTC analgesics without relief.  ROS: As per HPI.  OBJECTIVE:  Vitals:   10/15/17 1822  BP: 124/73  Pulse: 85  Resp: 18  Temp: 98.3 F (36.8 C)  TempSrc: Oral  SpO2: 96%    General appearance: alert; no distress HENT: normocephalic; atraumatic; dentition: fair; gingival hypertrophy over right upper gums with small area of induration; no fluctuance Neck: supple without LAD Lungs: normal respirations Skin: warm and dry Psychological: alert and cooperative; normal mood and affect  No Known Allergies  Past Medical History:  Diagnosis Date  . Anemia   . Anxiety   .  Anxiety   . Panic attacks   . Vertigo    Social History   Socioeconomic History  . Marital status: Single    Spouse name: Not on file  . Number of children: 3  . Years of education: College  . Highest education level: Not on file  Occupational History    Employer: [employer]    Comment: [employer]  . Financial resource strain: Not on file  . Food insecurity:    Worry: Not on file    Inability: Not on file  . Transportation needs:    Medical: Not on file    Non-medical: Not on file  Tobacco Use  . Smoking status: Never Smoker  . Smokeless tobacco: Never Used  Substance and Sexual Activity  . Alcohol use: Yes    Alcohol/week: 0.6 oz    Types: 1 Glasses of wine per week    Comment: social  . Drug use: No  . Sexual activity: Not Currently    Birth control/protection: Implant  Lifestyle  . Physical activity:    Days per week: Not on file    Minutes per session: Not on file  . Stress: Not on file  Relationships  . Social connections:    Talks on phone: Not on file    Gets together: Not on file    Attends religious service: Not on file    Active member of club or organization: Not on file    Attends meetings of clubs or organizations: Not  on file    Relationship status: Not on file  . Intimate partner violence:    Fear of current or ex partner: Not on file    Emotionally abused: Not on file    Physically abused: Not on file    Forced sexual activity: Not on file  Other Topics Concern  . Not on file  Social History Narrative   Patient lives at home alone.   Caffeine Use: none in the past 2 weeks, soda occasionally   Family History  Problem Relation Age of Onset  . Diabetes Mother   . Hypertension Mother   . Heart disease Father   . Seizures Father   . Seizures Unknown   . Heart failure Unknown   . Diabetes Sister    Past Surgical History:  Procedure Laterality Date  . NO PAST SURGERIES       Vanessa Kick, MD 10/16/17 [phone]

## 2017-10-24 ENCOUNTER — Other Ambulatory Visit: Payer: Self-pay | Admitting: *Deleted

## 2017-10-24 DIAGNOSIS — F418 Other specified anxiety disorders: Secondary | ICD-10-CM

## 2017-10-24 MED ORDER — SERTRALINE HCL 100 MG PO TABS: ORAL_TABLET | ORAL | 0 refills | Status: DC

## 2017-10-24 MED FILL — SERTRALINE HCL 100 MG TAB: 100 | 30 days supply | Qty: 45 | Fill #0

## 2017-10-24 NOTE — Telephone Encounter (Signed)
Must have office visit for refills.  Appt. Scheduled for Nov 27, 2017.  pt aware of appt.

## 2017-11-27 ENCOUNTER — Encounter (INDEPENDENT_AMBULATORY_CARE_PROVIDER_SITE_OTHER): Payer: Self-pay | Admitting: Nurse Practitioner

## 2017-11-27 ENCOUNTER — Ambulatory Visit: Payer: Self-pay | Admitting: Nurse Practitioner

## 2017-11-27 ENCOUNTER — Ambulatory Visit (INDEPENDENT_AMBULATORY_CARE_PROVIDER_SITE_OTHER): Payer: Medicaid Other | Admitting: Nurse Practitioner

## 2017-11-27 VITALS — BP 103/71 | HR 86 | Temp 97.7°F | Resp 18 | Ht 64.0 in | Wt 264.0 lb

## 2017-11-27 DIAGNOSIS — L309 Dermatitis, unspecified: Secondary | ICD-10-CM | POA: Diagnosis not present

## 2017-11-27 DIAGNOSIS — F418 Other specified anxiety disorders: Secondary | ICD-10-CM

## 2017-11-27 MED ORDER — PHENTERMINE HCL 37.5 MG PO CAPS: 37.5000 mg | ORAL_CAPSULE | ORAL | 1 refills | Status: DC

## 2017-11-27 MED ORDER — TRIAMCINOLONE ACETONIDE 0.025 % EX CREA: 1.0000 "application " | TOPICAL_CREAM | Freq: Two times a day (BID) | CUTANEOUS | 0 refills | Status: DC

## 2017-11-27 MED ORDER — SERTRALINE HCL 100 MG PO TABS: ORAL_TABLET | ORAL | 6 refills | Status: DC

## 2017-11-27 MED ORDER — TRIAMCINOLONE ACETONIDE 0.025 % EX OINT: 1.0000 "application " | TOPICAL_OINTMENT | Freq: Every day | CUTANEOUS | 0 refills | Status: DC

## 2017-11-27 NOTE — Progress Notes (Signed)
Assessment & Plan:  [name] was seen today for establish care.  Diagnoses and all orders for this visit:  Depression with anxiety -     sertraline (ZOLOFT) 100 MG tablet; TAKE ONE AND A HALF TABLETS (150 MG TOTAL)  BY MOUTH DAILY.  Morbid obesity (HCC) -     phentermine 37.5 MG capsule; Take 1 capsule (37.5 mg total) by mouth every morning.  Eczema, unspecified type -     triamcinolone (KENALOG) 0.025 % cream; Apply 1 application topically 2 (two) times daily.    Patient has been counseled on age-appropriate routine health concerns for screening and prevention. These are reviewed and up-to-date. Referrals have been placed accordingly. Immunizations are up-to-date or declined.    Subjective:   Chief Complaint  Patient presents with  . Establish Care   HPI Breanna Thomas 37 y.o. female presents to office today to establish care. She has a history of depression and anxiety and has concerns today regarding BLE edema and weight management.  Depression and Anxiety/panic Attacks Chronic. Stable. Controlled with zoloft mg daily. She endorses medication compliance. Denies suicidal ideation. Depression screen PHQ 2/9 11/27/2017  Decreased Interest 0  Down, Depressed, Hopeless 1  PHQ - 2 Score 1  Altered sleeping 1  Tired, decreased energy 2  Change in appetite 0  Feeling bad or failure about yourself  0  Trouble concentrating 0  Moving slowly or fidgety/restless 0  Suicidal thoughts 0  PHQ-9 Score 4   Obesity She endorses weight loss concerns. Motivated to work out and change eating habits. Interested in weight loss options. Will try phentermine. BMI 45.3.  She does not have a history of HTN, HPL or DM.    Review of Systems  Constitutional: Negative for fever, malaise/fatigue and weight loss.  HENT: Negative.  Negative for nosebleeds.   Eyes: Negative.  Negative for blurred vision, double vision and photophobia.  Respiratory: Negative.  Negative for cough and shortness of  breath.   Cardiovascular: Negative.  Negative for chest pain, palpitations and leg swelling.  Gastrointestinal: Negative.  Negative for heartburn, nausea and vomiting.  Musculoskeletal: Negative.  Negative for myalgias.  Skin: Positive for rash.  Neurological: Negative.  Negative for dizziness, focal weakness, seizures and headaches.  Psychiatric/Behavioral: Positive for depression. Negative for suicidal ideas. The patient is nervous/anxious.     Past Medical History:  Diagnosis Date  . Anemia   . Anxiety   . Anxiety   . Panic attacks   . Vertigo     Past Surgical History:  Procedure Laterality Date  . NO PAST SURGERIES      Family History  Problem Relation Age of Onset  . Diabetes Mother   . Hypertension Mother   . Heart disease Father   . Seizures Father   . Seizures Unknown   . Heart failure Unknown   . Diabetes Sister     Social History Reviewed with no changes to be made today.   Outpatient Medications Prior to Visit  Medication Sig Dispense Refill  . etonogestrel (IMPLANON) 68 MG IMPL implant Inject 1 each into the skin once.    . Multiple Vitamin (MULTIVITAMIN) tablet Take 1 tablet by mouth daily.    . sertraline (ZOLOFT) 100 MG tablet TAKE ONE AND A HALF TABLETS (150 MG TOTAL)  BY MOUTH DAILY. 45 tablet 0  . omeprazole (PRILOSEC) 20 MG capsule Take 1 capsule (20 mg total) by mouth daily. (Patient not taking: Reported on 11/27/2017) 30 capsule 2  .  clindamycin (CLEOCIN) 300 MG capsule Take 1 capsule (300 mg total) by mouth 3 (three) times daily. 30 capsule 0  . guaiFENesin (MUCINEX) 600 MG 12 hr tablet Take 1 tablet (600 mg total) by mouth 2 (two) times daily. (Patient not taking: Reported on 06/28/2017) 20 tablet 0  . HYDROcodone-acetaminophen (NORCO/VICODIN) 5-325 MG tablet Take 1 tablet by mouth every 6 (six) hours as needed for moderate pain or severe pain. 8 tablet 0  . ketoconazole (NIZORAL) 2 % cream Apply 1 application topically daily. Apply to affected areas  for 14 days. 15 g 0   No facility-administered medications prior to visit.     No Known Allergies     Objective:    BP 103/71 (BP Location: Left Arm, Patient Position: Sitting, Cuff Size: Large)   Pulse 86   Temp 97.7 F (36.5 C) (Oral)   Resp 18   Ht 5\' 4"  (1.626 m)   Wt 264 lb (119.7 kg)   LMP 11/27/2017   SpO2 100%   BMI 45.32 kg/m  Wt Readings from Last 3 Encounters:  11/27/17 264 lb (119.7 kg)  06/28/17 252 lb (114.3 kg)  05/07/17 250 lb (113.4 kg)    Physical Exam  Constitutional: She is oriented to person, place, and time. She appears well-developed and well-nourished. She is cooperative.  HENT:  Head: Normocephalic and atraumatic.    Eyes: EOM are normal.  Neck: Normal range of motion.  Cardiovascular: Normal rate, regular rhythm and normal heart sounds. Exam reveals no gallop and no friction rub.  No murmur heard. Pulmonary/Chest: Effort normal and breath sounds normal. No tachypnea. No respiratory distress. She has no decreased breath sounds. She has no wheezes. She has no rhonchi. She has no rales. She exhibits no tenderness.  Abdominal: Soft. Bowel sounds are normal.  Musculoskeletal: Normal range of motion. She exhibits no edema.  Neurological: She is alert and oriented to person, place, and time. Coordination normal.  Skin: Skin is warm and dry.  Psychiatric: She has a normal mood and affect. Her speech is normal and behavior is normal. Judgment and thought content normal. Cognition and memory are normal.  Nursing note and vitals reviewed.      Patient has been counseled extensively about nutrition and exercise as well as the importance of adherence with medications and regular follow-up. The patient was given clear instructions to go to ER or return to medical center if symptoms don't improve, worsen or new problems develop. The patient verbalized understanding.   Follow-up: No follow-ups on file.   [name], FNP-BC [facility] and [facility] [place], [place]   11/27/2017, 5:11 PM

## 2017-11-27 NOTE — Patient Instructions (Addendum)
Phentermine tablets or capsules What is this medicine? PHENTERMINE (FEN ter meen) decreases your appetite. It is used with a reduced calorie diet and exercise to help you lose weight. This medicine may be used for other purposes; ask your health care provider or pharmacist if you have questions. COMMON BRAND NAME(S): Adipex-P, Atti-Plex P, Atti-Plex P Spansule, Fastin, Lomaira, Pro-Fast, Tara-8 What should I tell my health care provider before I take this medicine? They need to know if you have any of these conditions: -agitation -glaucoma -heart disease -high blood pressure -history of substance abuse -lung disease called Primary Pulmonary Hypertension (PPH) -taken an MAOI like Carbex, Eldepryl, Marplan, Nardil, or Parnate in last 14 days -thyroid disease -an unusual or allergic reaction to phentermine, other medicines, foods, dyes, or preservatives -pregnant or trying to get pregnant -breast-feeding How should I use this medicine? Take this medicine by mouth with a glass of water. Follow the directions on the prescription label. The instructions for use may differ based on the product and dose you are taking. Avoid taking this medicine in the evening. It may interfere with sleep. Take your doses at regular intervals. Do not take your medicine more often than directed. Talk to your pediatrician regarding the use of this medicine in children. While this drug may be prescribed for children 17 years or older for selected conditions, precautions do apply. Overdosage: If you think you have taken too much of this medicine contact a poison control center or emergency room at once. NOTE: This medicine is only for you. Do not share this medicine with others. What if I miss a dose? If you miss a dose, take it as soon as you can. If it is almost time for your next dose, take only that dose. Do not take double or extra doses. What may interact with this medicine? Do not take this medicine with any of  the following medications: -duloxetine -MAOIs like Carbex, Eldepryl, Marplan, Nardil, and Parnate -medicines for colds or breathing difficulties like pseudoephedrine or phenylephrine -procarbazine -sibutramine -SSRIs like citalopram, escitalopram, fluoxetine, fluvoxamine, paroxetine, and sertraline -stimulants like dexmethylphenidate, methylphenidate or modafinil -venlafaxine This medicine may also interact with the following medications: -medicines for diabetes This list may not describe all possible interactions. Give your health care provider a list of all the medicines, herbs, non-prescription drugs, or dietary supplements you use. Also tell them if you smoke, drink alcohol, or use illegal drugs. Some items may interact with your medicine. What should I watch for while using this medicine? Notify your physician immediately if you become short of breath while doing your normal activities. Do not take this medicine within 6 hours of bedtime. It can keep you from getting to sleep. Avoid drinks that contain caffeine and try to stick to a regular bedtime every night. This medicine was intended to be used in addition to a healthy diet and exercise. The best results are achieved this way. This medicine is only indicated for short-term use. Eventually your weight loss may level out. At that point, the drug will only help you maintain your new weight. Do not increase or in any way change your dose without consulting your doctor. You may get drowsy or dizzy. Do not drive, use machinery, or do anything that needs mental alertness until you know how this medicine affects you. Do not stand or sit up quickly, especially if you are an older patient. This reduces the risk of dizzy or fainting spells. Alcohol may increase dizziness and drowsiness. Avoid alcoholic   drinks. What side effects may I notice from receiving this medicine? Side effects that you should report to your doctor or health care professional as  soon as possible: -chest pain, palpitations -depression or severe changes in mood -increased blood pressure -irritability -nervousness or restlessness -severe dizziness -shortness of breath -problems urinating -unusual swelling of the legs -vomiting Side effects that usually do not require medical attention (report to your doctor or health care professional if they continue or are bothersome): -blurred vision or other eye problems -changes in sexual ability or desire -constipation or diarrhea -difficulty sleeping -dry mouth or unpleasant taste -headache -nausea This list may not describe all possible side effects. Call your doctor for medical advice about side effects. You may report side effects to FDA at 1-800-FDA-1088. Where should I keep my medicine? Keep out of the reach of children. This medicine can be abused. Keep your medicine in a safe place to protect it from theft. Do not share this medicine with anyone. Selling or giving away this medicine is dangerous and against the law. This medicine may cause accidental overdose and death if taken by other adults, children, or pets. Mix any unused medicine with a substance like cat litter or coffee grounds. Then throw the medicine away in a sealed container like a sealed bag or a coffee can with a lid. Do not use the medicine after the expiration date. Store at room temperature between 20 and 25 degrees C (68 and 77 degrees F). Keep container tightly closed. NOTE: This sheet is a summary. It may not cover all possible information. If you have questions about this medicine, talk to your doctor, pharmacist, or health care provider.  2018 Elsevier/Gold Standard (2015-04-15 12:53:15) Phentermine tablets or capsules What is this medicine? PHENTERMINE (FEN ter meen) decreases your appetite. It is used with a reduced calorie diet and exercise to help you lose weight. This medicine may be used for other purposes; ask your health care provider  or pharmacist if you have questions. COMMON BRAND NAME(S): Adipex-P, Atti-Plex P, Atti-Plex P Spansule, Fastin, Lomaira, Pro-Fast, Tara-8 What should I tell my health care provider before I take this medicine? They need to know if you have any of these conditions: -agitation -glaucoma -heart disease -high blood pressure -history of substance abuse -lung disease called Primary Pulmonary Hypertension (PPH) -taken an MAOI like Carbex, Eldepryl, Marplan, Nardil, or Parnate in last 14 days -thyroid disease -an unusual or allergic reaction to phentermine, other medicines, foods, dyes, or preservatives -pregnant or trying to get pregnant -breast-feeding How should I use this medicine? Take this medicine by mouth with a glass of water. Follow the directions on the prescription label. The instructions for use may differ based on the product and dose you are taking. Avoid taking this medicine in the evening. It may interfere with sleep. Take your doses at regular intervals. Do not take your medicine more often than directed. Talk to your pediatrician regarding the use of this medicine in children. While this drug may be prescribed for children 17 years or older for selected conditions, precautions do apply. Overdosage: If you think you have taken too much of this medicine contact a poison control center or emergency room at once. NOTE: This medicine is only for you. Do not share this medicine with others. What if I miss a dose? If you miss a dose, take it as soon as you can. If it is almost time for your next dose, take only that dose. Do not take double  or extra doses. What may interact with this medicine? Do not take this medicine with any of the following medications: -duloxetine -MAOIs like Carbex, Eldepryl, Marplan, Nardil, and Parnate -medicines for colds or breathing difficulties like pseudoephedrine or phenylephrine -procarbazine -sibutramine -SSRIs like citalopram, escitalopram,  fluoxetine, fluvoxamine, paroxetine, and sertraline -stimulants like dexmethylphenidate, methylphenidate or modafinil -venlafaxine This medicine may also interact with the following medications: -medicines for diabetes This list may not describe all possible interactions. Give your health care provider a list of all the medicines, herbs, non-prescription drugs, or dietary supplements you use. Also tell them if you smoke, drink alcohol, or use illegal drugs. Some items may interact with your medicine. What should I watch for while using this medicine? Notify your physician immediately if you become short of breath while doing your normal activities. Do not take this medicine within 6 hours of bedtime. It can keep you from getting to sleep. Avoid drinks that contain caffeine and try to stick to a regular bedtime every night. This medicine was intended to be used in addition to a healthy diet and exercise. The best results are achieved this way. This medicine is only indicated for short-term use. Eventually your weight loss may level out. At that point, the drug will only help you maintain your new weight. Do not increase or in any way change your dose without consulting your doctor. You may get drowsy or dizzy. Do not drive, use machinery, or do anything that needs mental alertness until you know how this medicine affects you. Do not stand or sit up quickly, especially if you are an older patient. This reduces the risk of dizzy or fainting spells. Alcohol may increase dizziness and drowsiness. Avoid alcoholic drinks. What side effects may I notice from receiving this medicine? Side effects that you should report to your doctor or health care professional as soon as possible: -chest pain, palpitations -depression or severe changes in mood -increased blood pressure -irritability -nervousness or restlessness -severe dizziness -shortness of breath -problems urinating -unusual swelling of the  legs -vomiting Side effects that usually do not require medical attention (report to your doctor or health care professional if they continue or are bothersome): -blurred vision or other eye problems -changes in sexual ability or desire -constipation or diarrhea -difficulty sleeping -dry mouth or unpleasant taste -headache -nausea This list may not describe all possible side effects. Call your doctor for medical advice about side effects. You may report side effects to FDA at 1-800-FDA-1088. Where should I keep my medicine? Keep out of the reach of children. This medicine can be abused. Keep your medicine in a safe place to protect it from theft. Do not share this medicine with anyone. Selling or giving away this medicine is dangerous and against the law. This medicine may cause accidental overdose and death if taken by other adults, children, or pets. Mix any unused medicine with a substance like cat litter or coffee grounds. Then throw the medicine away in a sealed container like a sealed bag or a coffee can with a lid. Do not use the medicine after the expiration date. Store at room temperature between 20 and 25 degrees C (68 and 77 degrees F). Keep container tightly closed. NOTE: This sheet is a summary. It may not cover all possible information. If you have questions about this medicine, talk to your doctor, pharmacist, or health care provider.  2018 Elsevier/Gold Standard (2015-04-15 12:53:15)  Obesity, Adult Obesity is having too much body fat. If you have  a BMI of 30 or more, you are obese. BMI is a number that explains how much body fat you have. Obesity is often caused by taking in (consuming) more calories than your body uses. Obesity can cause serious health problems. Changing your lifestyle can help to treat obesity. Follow these instructions at home: Eating and drinking   Follow advice from your doctor about what to eat and drink. Your doctor may tell you to: ? Cut down on  (limit) fast foods, sweets, and processed snack foods. ? Choose low-fat options. For example, choose low-fat milk instead of whole milk. ? Eat 5 or more servings of fruits or vegetables every day. ? Eat at home more often. This gives you more control over what you eat. ? Choose healthy foods when you eat out. ? Learn what a healthy portion size is. A portion size is the amount of a certain food that is healthy for you to eat at one time. This is different for each person. ? Keep low-fat snacks available. ? Avoid sugary drinks. These include soda, fruit juice, iced tea that is sweetened with sugar, and flavored milk. ? Eat a healthy breakfast.  Drink enough water to keep your pee (urine) clear or pale yellow.  Do not go without eating for long periods of time (do not fast).  Do not go on popular or trendy diets (fad diets). Physical Activity  Exercise often, as told by your doctor. Ask your doctor: ? What types of exercise are safe for you. ? How often you should exercise.  Warm up and stretch before being active.  Do slow stretching after being active (cool down).  Rest between times of being active. Lifestyle  Limit how much time you spend in front of your TV, computer, or video game system (be less sedentary).  Find ways to reward yourself that do not involve food.  Limit alcohol intake to no more than 1 drink a day for nonpregnant women and 2 drinks a day for men. One drink equals 12 oz of beer, 5 oz of wine, or 1 oz of hard liquor. General instructions  Keep a weight loss journal. This can help you keep track of: ? The food that you eat. ? The exercise that you do.  Take over-the-counter and prescription medicines only as told by your doctor.  Take vitamins and supplements only as told by your doctor.  Think about joining a support group. Your doctor may be able to help with this.  Keep all follow-up visits as told by your doctor. This is important. Contact a doctor  if:  You cannot meet your weight loss goal after you have changed your diet and lifestyle for 6 weeks. This information is not intended to replace advice given to you by your health care provider. Make sure you discuss any questions you have with your health care provider. Document Released: 10/01/2011 Document Revised: 12/15/2015 Document Reviewed: 04/27/2015 Elsevier Interactive Patient Education  2018 [employer].

## 2018-03-17 ENCOUNTER — Encounter (HOSPITAL_COMMUNITY): Payer: Self-pay

## 2018-03-17 ENCOUNTER — Ambulatory Visit (HOSPITAL_COMMUNITY)
Admission: EM | Admit: 2018-03-17 | Discharge: 2018-03-17 | Disposition: A | Discharge status: Home / Self Care | Payer: Medicaid Other | Attending: Family Medicine | Admitting: Family Medicine

## 2018-03-17 DIAGNOSIS — R197 Diarrhea, unspecified: Secondary | ICD-10-CM | POA: Diagnosis not present

## 2018-03-17 NOTE — Discharge Instructions (Signed)
Small frequent sips of fluids- Pedialyte, Gatorade, water, broth- to maintain hydration.   May use imodium if needed, but if able to tolerate stools it can still be helpful to let them occur.  Tylenol as needed for pain.  Rest.  Liquids primarily but once symptoms improve may advance to bland diet. If this is tolerated may advance to regular diet.  Good hand hygiene, especially after use of the restroom.   If worsening of abdominal pain, weakness, dizziness, blood in stool, dehydration or otherwise worsening please return or go to the Er.  If persistent irregular stool patterns please follow up with your primary care provider.

## 2018-03-17 NOTE — ED Provider Notes (Signed)
Breanna Thomas    CSN: [id number] Arrival date & time: 03/17/18  1100     History   Chief Complaint Chief Complaint  Patient presents with  . Abdominal Pain  . Diarrhea    HPI Breanna Thomas is a 37 y.o. female.   Shizuko presents with complaints of abdominal cramping and diarrhea which started yesterday evening. Diarrhea x4 episodes last evening and x2 this morning. Abdominal pain primarily prior to diarrhea, no current abdominal pain. Comes and goes. No blood or black in stool. No nausea or vomiting. Stool is loose, not watery or mucus. No recent antibiotics. Occasionally dizzy. Has been drinking fluids. States if she eats it causes her to have diarrhea. Normal urination. 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 Had chik-fil-a prior to onset of symptoms. Son did as well but does not have symptoms. No fevers. Has not taken any medications for symptoms. Hx of anemia, anxiety, vertigo, gerd.     ROS per HPI.      Past Medical History:  Diagnosis Date  . Anemia   . Anxiety   . Anxiety   . Panic attacks   . Vertigo     Patient Active Problem List   Diagnosis Date Noted  . GERD (gastroesophageal reflux disease) 04/20/2016  . Cough 04/20/2016  . IFG (impaired fasting glucose) 12/30/2013  . Unspecified vitamin D deficiency 12/30/2013  . Back muscle spasm 12/15/2013  . Anxiety 04/10/2013    Past Surgical History:  Procedure Laterality Date  . NO PAST SURGERIES      OB History    Gravida  3   Para  3   Term      Preterm      AB      Living        SAB      TAB      Ectopic      Multiple      Live Births               Home Medications    Prior to Admission medications   Medication Sig Start Date End Date Taking? Authorizing Provider  etonogestrel (IMPLANON) 68 MG IMPL implant Inject 1 each into the skin once.    [provider]  Multiple Vitamin (MULTIVITAMIN) tablet Take 1 tablet by mouth daily.    [provider]  omeprazole (PRILOSEC) 20 MG capsule Take 1 capsule (20 mg total) by mouth daily. Patient not taking: Reported on 11/27/2017 06/28/17   Alfonse Spruce, FNP  phentermine 37.5 MG capsule Take 1 capsule (37.5 mg total) by mouth every morning. 11/27/17 12/27/17  [name], NP  sertraline (ZOLOFT) 100 MG tablet TAKE ONE AND A HALF TABLETS (150 MG TOTAL)  BY MOUTH DAILY. 11/27/17   [name], NP  triamcinolone (KENALOG) 0.025 % cream Apply 1 application topically 2 (two) times daily. 11/27/17   [name], NP    Family History Family History  Problem Relation Age of Onset  . Diabetes Mother   . Hypertension Mother   . Heart disease Father   . Seizures Father   . Seizures Unknown   . Heart failure Unknown   . Diabetes Sister     Social History Social History   Tobacco Use  . Smoking status: Never Smoker  . Smokeless tobacco: Never Used  Substance Use Topics  . Alcohol use: Yes    Alcohol/week: 1.0 standard drinks    Types: 1  Glasses of wine per week    Comment: social  . Drug use: No     Allergies   Patient has no known allergies.   Review of Systems Review of Systems   Physical Exam Triage Vital Signs ED Triage Vitals  Enc Vitals Group     BP 03/17/18 1131 122/81     Pulse Rate 03/17/18 1128 95     Resp 03/17/18 1128 20     Temp 03/17/18 1128 98 F (36.7 C)     Temp Source 03/17/18 1128 Oral     SpO2 03/17/18 1128 96 %     Weight --      Height --      Head Circumference --      Peak Flow --      Pain Score --      Pain Loc --      Pain Edu? --      Excl. in [place]? --    No data found.  Updated Vital Signs BP 122/81 (BP Location: Right Arm)   Pulse 95   Temp 98 F (36.7 C) (Oral)   Resp 20   LMP  (LMP Unknown)   SpO2 96%    Physical Exam  Constitutional: She is oriented to person, place, and time. She appears well-developed and well-nourished. No distress.  Cardiovascular: Normal rate, regular rhythm and normal heart  sounds.  Pulmonary/Chest: Effort normal and breath sounds normal.  Abdominal: Soft. Bowel sounds are normal. There is no tenderness. There is no rigidity, no rebound, no guarding, no CVA tenderness, no tenderness at McBurney's point and negative Murphy's sign.  Genitourinary: Fullness:   Neurological: She is alert and oriented to person, place, and time.  Skin: Skin is warm and dry.     UC Treatments / Results  Labs (all labs ordered are listed, but only abnormal results are displayed) Labs Reviewed - No data to display  EKG None  Radiology No results found.  Procedures Procedures (including critical care time)  Medications Ordered in UC Medications - No data to display  Initial Impression / Assessment and Plan / UC Course  I have reviewed the triage vital signs and the nursing notes.  Pertinent labs & imaging results that were available during my care of the patient were reviewed by me and considered in my medical decision making (see chart for details).     Non toxic, afebrile. No current pain. Tolerating PO intake. Vitals stable. History and physical consistent with viral illness.  Supportive cares recommended. Return precautions provided. Patient verbalized understanding and agreeable to plan.    Final Clinical Impressions(s) / UC Diagnoses   Final diagnoses:  Diarrhea, unspecified type     Discharge Instructions     Small frequent sips of fluids- Pedialyte, Gatorade, water, broth- to maintain hydration.   May use imodium if needed, but if able to tolerate stools it can still be helpful to let them occur.  Tylenol as needed for pain.  Rest.  Liquids primarily but once symptoms improve may advance to bland diet. If this is tolerated may advance to regular diet.  Good hand hygiene, especially after use of the restroom.   If worsening of abdominal pain, weakness, dizziness, blood in stool, dehydration or otherwise worsening please return or go to the Er.  If  persistent irregular stool patterns please follow up with your primary care provider.    ED Prescriptions    None     Controlled Substance Prescriptions [place] Controlled  Substance Registry consulted? Not Applicable   Zigmund Gottron, NP 03/17/18 1200

## 2018-03-17 NOTE — ED Triage Notes (Signed)
Pt presents with generalized abdominal pain and diarrhea with no other symptoms.

## 2018-04-09 ENCOUNTER — Ambulatory Visit (HOSPITAL_COMMUNITY)
Admission: EM | Admit: 2018-04-09 | Discharge: 2018-04-09 | Disposition: A | Discharge status: Home / Self Care | Payer: Medicaid Other | Attending: Family Medicine | Admitting: Family Medicine

## 2018-04-09 ENCOUNTER — Other Ambulatory Visit: Payer: Self-pay

## 2018-04-09 ENCOUNTER — Encounter (HOSPITAL_COMMUNITY): Payer: Self-pay | Admitting: Emergency Medicine

## 2018-04-09 DIAGNOSIS — J069 Acute upper respiratory infection, unspecified: Secondary | ICD-10-CM | POA: Diagnosis not present

## 2018-04-09 DIAGNOSIS — B009 Herpesviral infection, unspecified: Secondary | ICD-10-CM

## 2018-04-09 DIAGNOSIS — B9789 Other viral agents as the cause of diseases classified elsewhere: Secondary | ICD-10-CM | POA: Diagnosis not present

## 2018-04-09 MED ORDER — CETIRIZINE HCL 10 MG PO TABS: 10.0000 mg | ORAL_TABLET | Freq: Every day | ORAL | 0 refills | Status: DC

## 2018-04-09 MED ORDER — BENZONATATE 100 MG PO CAPS: 100.0000 mg | ORAL_CAPSULE | Freq: Three times a day (TID) | ORAL | 0 refills | Status: DC

## 2018-04-09 MED ORDER — VALACYCLOVIR HCL 1 G PO TABS: 2000.0000 mg | ORAL_TABLET | Freq: Two times a day (BID) | ORAL | 0 refills | Status: AC

## 2018-04-09 NOTE — Discharge Instructions (Addendum)
Get plenty of rest and push fluids Tessalon Perles prescribed as needed for symptomatic relief Zyrtec prescribed take as directed Valtrex prescribed for cold sore.  Use as directed and to completion Use OTC medication as needed for symptomatic relief Follow up with PCP as needed if symptoms persists Return or go to ER if you have any new or worsening symptoms

## 2018-04-09 NOTE — ED Provider Notes (Signed)
[place]   [id number] 04/09/18 Arrival Time: 3149   CC: URI with cold sore  SUBJECTIVE: History from: patient.  Breanna Thomas is a 37 y.o. female who presents with rhinorrhea and cough x 1 week.  Admits to positive sick exposure. Describes cough as productive with green sputum. Has tried mucinex with relief.  Denies aggravating factors.  Reports previous symptoms in the past. Complains of hx of subjective fever, and fatigue.  Denies chills, sinus pain, sore throat, SOB, wheezing, chest pain, nausea, changes in bowel or bladder habits.    Patient also mentions fever blister in nose that she noticed yesterday.  Describes as painful.  Denies previous symptoms in the past.  Denies aggravating or alleviating factors.   Also mentions yellow nails x 3 weeks after using nail polish remover.  Concerned for fungus. Denies aggravating or alleviating factors.  Denies similar symptoms in the past.    ROS: As per HPI.  Past Medical History:  Diagnosis Date  . Anemia   . Anxiety   . Anxiety   . Panic attacks   . Vertigo    Past Surgical History:  Procedure Laterality Date  . NO PAST SURGERIES     No Known Allergies No current facility-administered medications on file prior to encounter.    Current Outpatient Medications on File Prior to Encounter  Medication Sig Dispense Refill  . etonogestrel (IMPLANON) 68 MG IMPL implant Inject 1 each into the skin once.    . sertraline (ZOLOFT) 100 MG tablet TAKE ONE AND A HALF TABLETS (150 MG TOTAL)  BY MOUTH DAILY. 45 tablet 6  . Multiple Vitamin (MULTIVITAMIN) tablet Take 1 tablet by mouth daily.    Marland Kitchen omeprazole (PRILOSEC) 20 MG capsule Take 1 capsule (20 mg total) by mouth daily. (Patient not taking: Reported on 11/27/2017) 30 capsule 2  . phentermine 37.5 MG capsule Take 1 capsule (37.5 mg total) by mouth every morning. 30 capsule 1  . triamcinolone (KENALOG) 0.025 % cream Apply 1 application topically 2 (two) times daily. 30 g 0    Social History   Socioeconomic History  . Marital status: Single    Spouse name: Not on file  . Number of children: 3  . Years of education: College  . Highest education level: Not on file  Occupational History    Employer: [employer]    Comment: [place] [employer]  . Financial resource strain: Not on file  . Food insecurity:    Worry: Not on file    Inability: Not on file  . Transportation needs:    Medical: Not on file    Non-medical: Not on file  Tobacco Use  . Smoking status: Never Smoker  . Smokeless tobacco: Never Used  Substance and Sexual Activity  . Alcohol use: Yes    Alcohol/week: 1.0 standard drinks    Types: 1 Glasses of wine per week    Comment: social  . Drug use: No  . Sexual activity: Not Currently    Birth control/protection: Implant  Lifestyle  . Physical activity:    Days per week: Not on file    Minutes per session: Not on file  . Stress: Not on file  Relationships  . Social connections:    Talks on phone: Not on file    Gets together: Not on file    Attends religious service: Not on file    Active member of club or organization: Not on file    Attends meetings  of clubs or organizations: Not on file    Relationship status: Not on file  . Intimate partner violence:    Fear of current or ex partner: Not on file    Emotionally abused: Not on file    Physically abused: Not on file    Forced sexual activity: Not on file  Other Topics Concern  . Not on file  Social History Narrative   Patient lives at home alone.   Caffeine Use: none in the past 2 weeks, soda occasionally   Family History  Problem Relation Age of Onset  . Diabetes Mother   . Hypertension Mother   . Heart disease Father   . Seizures Father   . Seizures Unknown   . Heart failure Unknown   . Diabetes Sister     OBJECTIVE:  Vitals:   04/09/18 0853  BP: (!) 141/87  Pulse: 74  Resp: 18  Temp: 97.9 F (36.6 C)  TempSrc: Oral  SpO2: 97%     General  appearance: alert; nontoxic appearance; speaking in full sentences without difficulty HEENT: Ears: EACs clear, TMs pearly gray with visible cone of light, without erythema; Eyes: PERRL, EOMI grossly; Nose: cluster of vesicles left nare, with mild clear rhinorrhea; Throat: oropharynx not erythematous and clear, tonsils 1+ without white tonsillar exudates, uvula midline Neck: supple without LAD Lungs: unlabored respirations, symmetrical air entry; cough: mild; no respiratory distress Heart: regular rate and rhythm.  Radial pulses 2+ symmetrical bilaterally Skin: warm and dry; slight yellow discoloration of distal tips of finger nails including left fifth and right first finger; no associated thickening of nails Psychological: alert and cooperative; normal mood and affect  ASSESSMENT & PLAN:  1. Viral URI with cough   2. Herpes infection     Meds ordered this encounter  Medications  . valACYclovir (VALTREX) 1000 MG tablet    Sig: Take 2 tablets (2,000 mg total) by mouth 2 (two) times daily for 1 day.    Dispense:  4 tablet    Refill:  0    Order Specific Question:   Supervising Provider    Answer:   Wynona Luna [phone]  . benzonatate (TESSALON) 100 MG capsule    Sig: Take 1 capsule (100 mg total) by mouth every 8 (eight) hours.    Dispense:  21 capsule    Refill:  0    Order Specific Question:   Supervising Provider    Answer:   Wynona Luna [phone]  . cetirizine (ZYRTEC) 10 MG tablet    Sig: Take 1 tablet (10 mg total) by mouth daily.    Dispense:  20 tablet    Refill:  0    Order Specific Question:   Supervising Provider    Answer:   Wynona Luna [[id number]]    Get plenty of rest and push fluids Tessalon Perles prescribed as needed for symptomatic relief Zyrtec prescribed take as directed Valtrex prescribed for cold sore.  Use as directed and to completion Use OTC medication as needed for symptomatic relief Follow up with PCP as needed if symptoms  persists Return or go to ER if you have any new or worsening symptoms   Yellow discoloration of fingernails most likely due to using acetone to remove fingernail polish a few weeks ago.  Symptoms appears to be improving/resolving.  Does not look fungal in nature.  Instructed patient to follow up with PCP in 1-2 weeks if symptoms persists.    Reviewed expectations re: course of current  medical issues. Questions answered. Outlined signs and symptoms indicating need for more acute intervention. Patient verbalized understanding. After Visit Summary given.         Lestine Box, PA-C 04/09/18 1117

## 2018-04-09 NOTE — ED Triage Notes (Signed)
Pt reports a cold x1 week and a "fever blister" in her nose that she noticed this morning.  She also reports a yellowish hue to the tips of her fingernails x3 weeks.

## 2018-04-10 ENCOUNTER — Ambulatory Visit (INDEPENDENT_AMBULATORY_CARE_PROVIDER_SITE_OTHER): Payer: Self-pay | Admitting: Physician Assistant

## 2018-04-29 ENCOUNTER — Inpatient Hospital Stay: Payer: Self-pay | Admitting: Family Medicine

## 2018-05-08 ENCOUNTER — Ambulatory Visit (INDEPENDENT_AMBULATORY_CARE_PROVIDER_SITE_OTHER): Payer: Medicaid Other

## 2018-05-08 ENCOUNTER — Other Ambulatory Visit: Payer: Self-pay

## 2018-05-08 ENCOUNTER — Ambulatory Visit (HOSPITAL_COMMUNITY)
Admission: EM | Admit: 2018-05-08 | Discharge: 2018-05-08 | Disposition: A | Discharge status: Home / Self Care | Payer: Medicaid Other | Attending: Family Medicine | Admitting: Family Medicine

## 2018-05-08 ENCOUNTER — Encounter (HOSPITAL_COMMUNITY): Payer: Self-pay | Admitting: Emergency Medicine

## 2018-05-08 DIAGNOSIS — J181 Lobar pneumonia, unspecified organism: Secondary | ICD-10-CM

## 2018-05-08 DIAGNOSIS — R05 Cough: Secondary | ICD-10-CM | POA: Diagnosis not present

## 2018-05-08 DIAGNOSIS — J189 Pneumonia, unspecified organism: Secondary | ICD-10-CM

## 2018-05-08 DIAGNOSIS — R0602 Shortness of breath: Secondary | ICD-10-CM | POA: Diagnosis not present

## 2018-05-08 MED ORDER — ALBUTEROL SULFATE HFA 108 (90 BASE) MCG/ACT IN AERS: 2.0000 | INHALATION_SPRAY | Freq: Four times a day (QID) | RESPIRATORY_TRACT | 2 refills | Status: DC | PRN

## 2018-05-08 MED ORDER — ACETAMINOPHEN 325 MG PO TABS
ORAL_TABLET | ORAL | Status: AC
  Filled 2018-05-08: qty 2

## 2018-05-08 MED ORDER — AZITHROMYCIN 250 MG PO TABS: ORAL_TABLET | ORAL | 0 refills | Status: AC

## 2018-05-08 MED ORDER — ACETAMINOPHEN 325 MG PO TABS
650.0000 mg | ORAL_TABLET | Freq: Once | ORAL | Status: AC
  Administered 2018-05-08: 650 mg via ORAL

## 2018-05-08 NOTE — ED Provider Notes (Signed)
[place]    CSN: [id number] Arrival date & time: 05/08/18  1524     History   Chief Complaint Chief Complaint  Patient presents with  . Cough    HPI Breanna Thomas is a 37 y.o. female.   Breanna Thomas presents with complaints of cough, fever, headache, shortness of breath , sore throat, chills, voice hoarseness that has been ongoing for the past 4-7 days. Her daughter has been ill also. Hasn't worsened but hasn't improved. Has had loose stool. No rash. Eating and drinking but appetite decreased. Has been taking mucinex cold and flu which has not helped with fever but helps some. No history of asthma, doesn't smoke. Hx of anemia, anxiety, gerd.    ROS per HPI.      Past Medical History:  Diagnosis Date  . Anemia   . Anxiety   . Anxiety   . Panic attacks   . Vertigo     Patient Active Problem List   Diagnosis Date Noted  . GERD (gastroesophageal reflux disease) 04/20/2016  . Cough 04/20/2016  . IFG (impaired fasting glucose) 12/30/2013  . Unspecified vitamin D deficiency 12/30/2013  . Back muscle spasm 12/15/2013  . Anxiety 04/10/2013    Past Surgical History:  Procedure Laterality Date  . NO PAST SURGERIES      OB History    Gravida  3   Para  3   Term      Preterm      AB      Living        SAB      TAB      Ectopic      Multiple      Live Births               Home Medications    Prior to Admission medications   Medication Sig Start Date End Date Taking? Authorizing Provider  albuterol (PROVENTIL HFA;VENTOLIN HFA) 108 (90 Base) MCG/ACT inhaler Inhale 2 puffs into the lungs every 6 (six) hours as needed for wheezing or shortness of breath. 05/08/18   Zigmund Gottron, NP  azithromycin (ZITHROMAX) 250 MG tablet Take 2 tablets (500 mg total) by mouth daily for 1 day, THEN 1 tablet (250 mg total) daily for 4 days. 05/08/18 05/13/18  Zigmund Gottron, NP  etonogestrel (IMPLANON) 68 MG IMPL implant Inject 1 each into the  skin once.    [provider]  Multiple Vitamin (MULTIVITAMIN) tablet Take 1 tablet by mouth daily.    [provider]  phentermine 37.5 MG capsule Take 1 capsule (37.5 mg total) by mouth every morning. 11/27/17 12/27/17  [name], NP  sertraline (ZOLOFT) 100 MG tablet TAKE ONE AND A HALF TABLETS (150 MG TOTAL)  BY MOUTH DAILY. 11/27/17   [name], NP  triamcinolone (KENALOG) 0.025 % cream Apply 1 application topically 2 (two) times daily. 11/27/17   [name], NP    Family History Family History  Problem Relation Age of Onset  . Diabetes Mother   . Hypertension Mother   . Heart disease Father   . Seizures Father   . Seizures Unknown   . Heart failure Unknown   . Diabetes Sister     Social History Social History   Tobacco Use  . Smoking status: Never Smoker  . Smokeless tobacco: Never Used  Substance Use Topics  . Alcohol use: Yes    Alcohol/week: 1.0 standard drinks    Types: 1  Glasses of wine per week    Comment: social  . Drug use: No     Allergies   Patient has no known allergies.   Review of Systems Review of Systems   Physical Exam Triage Vital Signs ED Triage Vitals  Enc Vitals Group     BP 05/08/18 1548 119/79     Pulse Rate 05/08/18 1548 (!) 115     Resp 05/08/18 1548 (!) 28     Temp 05/08/18 1548 (!) 101 F (38.3 C)     Temp Source 05/08/18 1548 Oral     SpO2 05/08/18 1548 100 %     Weight --      Height --      Head Circumference --      Peak Flow --      Pain Score 05/08/18 1546 7     Pain Loc --      Pain Edu? --      Excl. in [place]? --    No data found.  Updated Vital Signs BP 119/79 (BP Location: Right Arm)   Pulse (!) 115   Temp (!) 101 F (38.3 C) (Oral)   Resp (!) 28   SpO2 100%    Physical Exam  Constitutional: She is oriented to person, place, and time. She appears well-developed and well-nourished. No distress.  HENT:  Head: Normocephalic and atraumatic.  Right Ear: Tympanic membrane,  external ear and ear canal normal.  Left Ear: Tympanic membrane, external ear and ear canal normal.  Nose: Nose normal.  Mouth/Throat: Uvula is midline, oropharynx is clear and moist and mucous membranes are normal. No tonsillar exudate.  Eyes: Pupils are equal, round, and reactive to light. Conjunctivae and EOM are normal.  Cardiovascular: Regular rhythm and normal heart sounds. Tachycardia present.  Pulmonary/Chest: Effort normal. She has decreased breath sounds in the right lower field and the left lower field.  Neurological: She is alert and oriented to person, place, and time.  Skin: Skin is warm and dry.     UC Treatments / Results  Labs (all labs ordered are listed, but only abnormal results are displayed) Labs Reviewed - No data to display  EKG None  Radiology Dg Chest 2 View  Result Date: 05/08/2018 CLINICAL DATA:  Cough.  Headache.  Shortness of breath. EXAM: CHEST - 2 VIEW COMPARISON:  Chest x-ray 05/16/2016. FINDINGS: Mediastinum is unremarkable. Borderline cardiomegaly diffuse mild interstitial prominence. Mild CHF could present in this fashion. Pneumonitis could present in this fashion. Right base atelectasis/infiltrate. No pleural effusion or pneumothorax. No acute bony abnormality. IMPRESSION: 1.  Borderline cardiomegaly. 2. Bilateral diffuse pulmonary interstitial prominence. Mild CHF could present in this fashion. Pneumonitis could present this fashion. 3.  Right base atelectasis/infiltrate. Electronically Signed   By: Marcello Moores  Register   On: 05/08/2018 16:37    Procedures Procedures (including critical care time)  Medications Ordered in UC Medications  acetaminophen (TYLENOL) tablet 650 mg (650 mg Oral Given 05/08/18 1554)    Initial Impression / Assessment and Plan / UC Course  I have reviewed the triage vital signs and the nursing notes.  Pertinent labs & imaging results that were available during my care of the patient were reviewed by me and considered in  my medical decision making (see chart for details).     Febrile and tachycardia with cough and shortness of breath. Chest xray is concerning for possible PNA, will cover with azithromycin. Continue with supportive cares. Return precautions provided. If symptoms worsen or do  not improve in the next week to return to be seen or to follow up with PCP.  Patient verbalized understanding and agreeable to plan.   Final Clinical Impressions(s) / UC Diagnoses   Final diagnoses:  Community acquired pneumonia of right lower lobe of lung ([place])     Discharge Instructions     Push fluids to ensure adequate hydration and keep secretions thin.  Tylenol and/or ibuprofen as needed for pain or fevers.  May take ibuprofen in addition to your Mucinex over the counter.  Complete course of antibiotics.  If symptoms worsen or do not improve in the next week to return to be seen or to follow up with your PCP.      ED Prescriptions    Medication Sig Dispense Auth. Provider   azithromycin (ZITHROMAX) 250 MG tablet Take 2 tablets (500 mg total) by mouth daily for 1 day, THEN 1 tablet (250 mg total) daily for 4 days. 6 tablet Augusto Gamble B, NP   albuterol (PROVENTIL HFA;VENTOLIN HFA) 108 (90 Base) MCG/ACT inhaler Inhale 2 puffs into the lungs every 6 (six) hours as needed for wheezing or shortness of breath. 1 Inhaler Zigmund Gottron, NP     Controlled Substance Prescriptions [place] Controlled Substance Registry consulted? Not Applicable   Zigmund Gottron, NP 05/08/18 1646

## 2018-05-08 NOTE — ED Triage Notes (Signed)
Fever started on Friday.  Since then has had a fever intermittently, chills and cough.  Patient has coughed up green phlegm and has scant amount of blood.  Patient has had sore throat, loss of voice

## 2018-05-08 NOTE — Discharge Instructions (Signed)
Push fluids to ensure adequate hydration and keep secretions thin.  Tylenol and/or ibuprofen as needed for pain or fevers.  May take ibuprofen in addition to your Mucinex over the counter.  Complete course of antibiotics.  If symptoms worsen or do not improve in the next week to return to be seen or to follow up with your PCP.

## 2018-06-03 ENCOUNTER — Other Ambulatory Visit: Payer: Self-pay

## 2018-06-03 ENCOUNTER — Ambulatory Visit (INDEPENDENT_AMBULATORY_CARE_PROVIDER_SITE_OTHER): Payer: Medicaid Other | Admitting: Physician Assistant

## 2018-06-03 ENCOUNTER — Encounter (INDEPENDENT_AMBULATORY_CARE_PROVIDER_SITE_OTHER): Payer: Self-pay | Admitting: Physician Assistant

## 2018-06-03 VITALS — BP 122/84 | HR 90 | Temp 98.1°F | Ht 64.0 in | Wt 264.0 lb

## 2018-06-03 DIAGNOSIS — R21 Rash and other nonspecific skin eruption: Secondary | ICD-10-CM | POA: Diagnosis not present

## 2018-06-03 DIAGNOSIS — Z23 Encounter for immunization: Secondary | ICD-10-CM

## 2018-06-03 DIAGNOSIS — Z09 Encounter for follow-up examination after completed treatment for conditions other than malignant neoplasm: Secondary | ICD-10-CM | POA: Diagnosis not present

## 2018-06-03 DIAGNOSIS — J189 Pneumonia, unspecified organism: Secondary | ICD-10-CM

## 2018-06-03 NOTE — Patient Instructions (Signed)

## 2018-06-03 NOTE — Progress Notes (Signed)
Subjective:  Patient ID: Breanna Thomas, female    DOB: 11/17/1980  Age: 37 y.o. MRN: [id number]  CC: hospital f/u PNA  HPI Breanna Thomas is a 37 y.o. female with a medical history of anxiety, panic attacks, anemia, vertigo, GERD, peripheral edema, interstitial pneumonia, endocervical polyp, morbid obesity, and snoring presents as a new patient on hospital f/u for CAP. ED visit on 05/08/18. Prescribed Zpack and Albuterol. States she took medication to completion and is feeling better. Does not endorse cough, fever, chills, malaise.     Complains of multiple small bump on the left cheek. Diagnosed as eczema by previous provider and prescribed low potency steroid. Says she applied the steroid and "broke out" with similar bumps on right cheek. Applied proactive and her "bumps" subsided temporarily. Would like a dermatology referral.        Outpatient Medications Prior to Visit  Medication Sig Dispense Refill  . albuterol (PROVENTIL HFA;VENTOLIN HFA) 108 (90 Base) MCG/ACT inhaler Inhale 2 puffs into the lungs every 6 (six) hours as needed for wheezing or shortness of breath. 1 Inhaler 2  . etonogestrel (IMPLANON) 68 MG IMPL implant Inject 1 each into the skin once.    . Multiple Vitamin (MULTIVITAMIN) tablet Take 1 tablet by mouth daily.    . sertraline (ZOLOFT) 100 MG tablet TAKE ONE AND A HALF TABLETS (150 MG TOTAL)  BY MOUTH DAILY. 45 tablet 6  . phentermine 37.5 MG capsule Take 1 capsule (37.5 mg total) by mouth every morning. 30 capsule 1  . triamcinolone (KENALOG) 0.025 % cream Apply 1 application topically 2 (two) times daily. 30 g 0   No facility-administered medications prior to visit.      ROS Review of Systems  Constitutional: Negative for chills, fever and malaise/fatigue.  Eyes: Negative for blurred vision.  Respiratory: Negative for shortness of breath.   Cardiovascular: Negative for chest pain and palpitations.  Gastrointestinal: Negative for abdominal pain and  nausea.  Genitourinary: Negative for dysuria and hematuria.  Musculoskeletal: Negative for joint pain and myalgias.  Skin: Negative for rash.       Bumps on cheek  Neurological: Negative for tingling and headaches.  Psychiatric/Behavioral: Negative for depression. The patient is not nervous/anxious.     Objective:  BP 122/84 (BP Location: Right Arm, Patient Position: Sitting, Cuff Size: Large)   Pulse 90   Temp 98.1 F (36.7 C) (Oral)   Ht 5\' 4"  (1.626 m)   Wt 264 lb (119.7 kg)   LMP 05/23/2018 (Approximate)   SpO2 98%   BMI 45.32 kg/m   BP/Weight 06/03/2018 05/08/2018 4/33/2951  Systolic BP 884 166 063  Diastolic BP 84 86 87  Wt. (Lbs) 264 - -  BMI 45.32 - -      Physical Exam  Constitutional: She is oriented to person, place, and time.  Well developed, well nourished, NAD, polite  HENT:  Head: Normocephalic and atraumatic.  Eyes: No scleral icterus.  Neck: Normal range of motion. Neck supple. No thyromegaly present.  Cardiovascular: Normal rate, regular rhythm and normal heart sounds.  Pulmonary/Chest: Effort normal and breath sounds normal.  Musculoskeletal: She exhibits no edema.  Neurological: She is alert and oriented to person, place, and time.  Skin: Skin is warm and dry. No rash noted. No erythema. No pallor.  Multiple tiny papules on left cheek on a hyperpigmented background. No background erythema, suppuration, bleeding, scaling, or crusting.   Psychiatric: She has a normal mood and affect. Her behavior is normal.  Thought content normal.  Vitals reviewed.    Assessment & Plan:   1. Community acquired pneumonia, unspecified laterality - Resolved with treatment from ED, Zpack.  2. Hospital discharge follow-up - Notes reviewed  3. Facial rash - Ambulatory referral to Dermatology - Advised to use salicylic acid and/or benzoyl peroxide OTC.  4. Need for prophylactic vaccination and inoculation against influenza - Flu Vaccine QUAD 6+ mos PF IM  (Fluarix Quad PF)   Follow-up: Return if symptoms worsen or fail to improve.   Clent Demark PA

## 2018-09-03 DIAGNOSIS — L309 Dermatitis, unspecified: Secondary | ICD-10-CM | POA: Diagnosis not present

## 2018-09-03 DIAGNOSIS — Z872 Personal history of diseases of the skin and subcutaneous tissue: Secondary | ICD-10-CM | POA: Diagnosis not present

## 2019-01-14 ENCOUNTER — Ambulatory Visit (HOSPITAL_COMMUNITY)
Admission: EM | Admit: 2019-01-14 | Discharge: 2019-01-14 | Disposition: A | Discharge status: Home / Self Care | Payer: Medicaid Other | Attending: Family Medicine | Admitting: Family Medicine

## 2019-01-14 ENCOUNTER — Other Ambulatory Visit: Payer: Self-pay

## 2019-01-14 ENCOUNTER — Encounter (HOSPITAL_COMMUNITY): Payer: Self-pay

## 2019-01-14 DIAGNOSIS — K219 Gastro-esophageal reflux disease without esophagitis: Secondary | ICD-10-CM | POA: Diagnosis not present

## 2019-01-14 DIAGNOSIS — M26622 Arthralgia of left temporomandibular joint: Secondary | ICD-10-CM | POA: Diagnosis not present

## 2019-01-14 MED ORDER — OMEPRAZOLE 20 MG PO CPDR: 20.0000 mg | DELAYED_RELEASE_CAPSULE | Freq: Every day | ORAL | 0 refills | Status: DC

## 2019-01-14 NOTE — ED Provider Notes (Signed)
[place]    CSN: [id number] Arrival date & time: 01/14/19  1800     History   Chief Complaint Chief Complaint  Patient presents with  . Jaw Pain    HPI Breanna Thomas is a 38 y.o. female. She presents today with left jaw pain in the joint when she yawns or chews; she states this pain is relieved with Tylenol;  she states she is not able to take ibuprofen/NSAIDS.  She also reports gastric reflux when she lies down and worse at night; she states she was previously diagnosed with GERD two years ago but has not taken any medication that was prescribed; her reflux symptoms resolved but returned 2-3 months ago. She denies fever/chills, tooth pain, SOB, nausea, vomiting, or other symptoms.   The history is provided by the patient.    Past Medical History:  Diagnosis Date  . Anemia   . Anxiety   . Anxiety   . Panic attacks   . Vertigo     Patient Active Problem List   Diagnosis Date Noted  . GERD (gastroesophageal reflux disease) 04/20/2016  . Cough 04/20/2016  . IFG (impaired fasting glucose) 12/30/2013  . Unspecified vitamin D deficiency 12/30/2013  . Back muscle spasm 12/15/2013  . Anxiety 04/10/2013    Past Surgical History:  Procedure Laterality Date  . NO PAST SURGERIES      OB History    Gravida  3   Para  3   Term      Preterm      AB      Living        SAB      TAB      Ectopic      Multiple      Live Births               Home Medications    Prior to Admission medications   Medication Sig Start Date End Date Taking? Authorizing Provider  albuterol (PROVENTIL HFA;VENTOLIN HFA) 108 (90 Base) MCG/ACT inhaler Inhale 2 puffs into the lungs every 6 (six) hours as needed for wheezing or shortness of breath. 05/08/18   Zigmund Gottron, NP  etonogestrel (IMPLANON) 68 MG IMPL implant Inject 1 each into the skin once.    [provider]  Multiple Vitamin (MULTIVITAMIN) tablet Take 1 tablet by mouth daily.    [provider]  omeprazole (PRILOSEC) 20 MG capsule Take 1 capsule (20 mg total) by mouth daily. 01/14/19   Sharion Balloon, NP  sertraline (ZOLOFT) 100 MG tablet TAKE ONE AND A HALF TABLETS (150 MG TOTAL)  BY MOUTH DAILY. 11/27/17   [name], NP    Family History Family History  Problem Relation Age of Onset  . Diabetes Mother   . Hypertension Mother   . Heart disease Father   . Seizures Father   . Seizures Other   . Heart failure Other   . Diabetes Sister     Social History Social History   Tobacco Use  . Smoking status: Never Smoker  . Smokeless tobacco: Never Used  Substance Use Topics  . Alcohol use: Yes    Alcohol/week: 1.0 standard drinks    Types: 1 Glasses of wine per week    Comment: social  . Drug use: No     Allergies   Patient has no known allergies.   Review of Systems Review of Systems  Constitutional: Negative for chills and fever.  HENT: Negative  for ear pain and sore throat.   Eyes: Negative for pain and visual disturbance.  Respiratory: Negative for cough and shortness of breath.   Cardiovascular: Negative for chest pain and palpitations.  Gastrointestinal: Positive for abdominal pain. Negative for vomiting.  Genitourinary: Negative for dysuria and hematuria.  Musculoskeletal: Positive for arthralgias. Negative for back pain.  Skin: Negative for color change and rash.  Neurological: Negative for seizures and syncope.  All other systems reviewed and are negative.    Physical Exam Triage Vital Signs ED Triage Vitals  Enc Vitals Group     BP 01/14/19 1828 124/76     Pulse Rate 01/14/19 1828 90     Resp 01/14/19 1828 18     Temp 01/14/19 1828 98.9 F (37.2 C)     Temp Source 01/14/19 1828 Oral     SpO2 01/14/19 1828 100 %     Weight 01/14/19 1826 274 lb (124.3 kg)     Height --      Head Circumference --      Peak Flow --      Pain Score 01/14/19 1826 5     Pain Loc --      Pain Edu? --      Excl. in [place]? --    No data found.   Updated Vital Signs BP 124/76 (BP Location: Right Arm)   Pulse 90   Temp 98.9 F (37.2 C) (Oral)   Resp 18   Wt 274 lb (124.3 kg)   SpO2 100%   BMI 47.03 kg/m   Visual Acuity Right Eye Distance:   Left Eye Distance:   Bilateral Distance:    Right Eye Near:   Left Eye Near:    Bilateral Near:     Physical Exam Vitals signs and nursing note reviewed.  Constitutional:      General: She is not in acute distress.    Appearance: She is well-developed.  HENT:     Head: Normocephalic and atraumatic.     Right Ear: Tympanic membrane normal.     Left Ear: Tympanic membrane normal.     Mouth/Throat:     Mouth: Mucous membranes are moist.     Pharynx: No oropharyngeal exudate or posterior oropharyngeal erythema.  Eyes:     Conjunctiva/sclera: Conjunctivae normal.  Neck:     Musculoskeletal: Neck supple. No muscular tenderness.  Cardiovascular:     Rate and Rhythm: Normal rate and regular rhythm.     Heart sounds: No murmur.  Pulmonary:     Effort: Pulmonary effort is normal. No respiratory distress.     Breath sounds: Normal breath sounds.  Abdominal:     Palpations: Abdomen is soft.     Tenderness: There is no abdominal tenderness. There is no guarding or rebound.  Musculoskeletal:        General: No swelling or tenderness.  Skin:    General: Skin is warm and dry.  Neurological:     Mental Status: She is alert.      UC Treatments / Results  Labs (all labs ordered are listed, but only abnormal results are displayed) Labs Reviewed - No data to display  EKG None  Radiology No results found.  Procedures Procedures (including critical care time)  Medications Ordered in UC Medications - No data to display  Initial Impression / Assessment and Plan / UC Course  I have reviewed the triage vital signs and the nursing notes.  Pertinent labs & imaging results that were available during  my care of the patient were reviewed by me and considered in my medical  decision making (see chart for details).   Arthralgia of left temporomandibular joint. GERD.  Exam of jaw is unremarkable; discussed need to rest jaw, take Tylenol for pain, follow up with dentist or primary care provider in 2 weeks.    GERD treated with omeprazole, discussed need to follow up with primary care provider in 2 weeks.  Discussed that she is to go to ER if chest pain or other concerning symptoms.    Final Clinical Impressions(s) / UC Diagnoses   Final diagnoses:  Arthralgia of left temporomandibular joint  Gastroesophageal reflux disease, esophagitis presence not specified     Discharge Instructions     For the pain in your left jaw, rest your jaw and try not to expand the joint fully for a few days.  If the pain persists, follow up with your dentist or primary care provider.  You can take Tylenol for the pain, since you are unable to take ibuprofen.  For your acid reflux, take the omeprazole as prescribed.  If you develop worsening symptoms, go to the ER.  Follow up with your primary care provider in 2 weeks.       ED Prescriptions    Medication Sig Dispense Auth. Provider   omeprazole (PRILOSEC) 20 MG capsule Take 1 capsule (20 mg total) by mouth daily. 14 capsule Sharion Balloon, NP     Controlled Substance Prescriptions [place] Controlled Substance Registry consulted? Not Applicable   Sharion Balloon, NP 01/14/19 1948

## 2019-01-14 NOTE — Discharge Instructions (Signed)
For the pain in your left jaw, rest your jaw and try not to expand the joint fully for a few days.  If the pain persists, follow up with your dentist or primary care provider.  You can take Tylenol for the pain, since you are unable to take ibuprofen.  For your acid reflux, take the omeprazole as prescribed.  If you develop worsening symptoms, go to the ER.  Follow up with your primary care provider in 2 weeks.

## 2019-01-14 NOTE — ED Triage Notes (Signed)
Pt states she has jaw pain  And swelling on her left side of her mouth x 1 week . Pt states she has acid reflux issue as well. X 2 months or more.

## 2019-01-22 ENCOUNTER — Other Ambulatory Visit: Payer: Self-pay

## 2019-01-22 ENCOUNTER — Encounter (INDEPENDENT_AMBULATORY_CARE_PROVIDER_SITE_OTHER): Payer: Medicaid Other | Admitting: Primary Care

## 2019-01-22 ENCOUNTER — Encounter (INDEPENDENT_AMBULATORY_CARE_PROVIDER_SITE_OTHER): Payer: Self-pay | Admitting: Primary Care

## 2019-01-22 DIAGNOSIS — Z09 Encounter for follow-up examination after completed treatment for conditions other than malignant neoplasm: Secondary | ICD-10-CM

## 2019-01-22 DIAGNOSIS — K219 Gastro-esophageal reflux disease without esophagitis: Secondary | ICD-10-CM

## 2019-01-22 DIAGNOSIS — F418 Other specified anxiety disorders: Secondary | ICD-10-CM

## 2019-01-22 NOTE — Progress Notes (Signed)
Virtual Visit via Telephone Note  I connected with Breanna Thomas on 01/22/19 at  1:50 PM EDT by telephone and verified that I am speaking with the correct person using two identifiers.   I discussed the limitations, risks, security and privacy concerns of performing an evaluation and management service by telephone and the availability of in person appointments. I also discussed with the patient that there may be a patient responsible charge related to this service. The patient expressed understanding and agreed to proceed.   History of Present Illness: Patient is being seen for chest pain that she feels is related to her heart burn, the pain is non specific states goes back and forth. Previously seen in emergency for left jaw pain in the joint when she yawns or chews;(TMJ). She denies any Denies shortness of breath, headaches, chest pain or lower extremity edema   Observations/Objective: Review of Systems  HENT:       TMJ  Cardiovascular: Positive for chest pain.       Tightness    Gastrointestinal: Positive for abdominal pain and heartburn.    Assessment and Plan: Breanna Thomas was seen today for hospitalization follow-up and error.  Diagnoses and all orders for this visit:  Depression with anxiety This can be triggers increased stress mimicking chest pain. She is on zoloft it  has been increase she will follow up in 4-6 weeks or affective   Gastroesophageal reflux disease, esophagitis presence not specified On omeprazole recommend decrease fatty foods , fried foods , have head of bed elevated and after Eating sit up 30-1 hour   Hospital discharge follow-up  Arthralgia of left temporomandibular joint (TMJ)  And acid reflux . Only medication changes were omeprazole and follow up with PCP     Follow Up Instructions:    I discussed the assessment and treatment plan with the patient. The patient was provided an opportunity to ask questions and all were answered. The patient  agreed with the plan and demonstrated an understanding of the instructions.   The patient was advised to call back or seek an in-person evaluation if the symptoms worsen or if the condition fails to improve as anticipated.  I provided 15 minutes of non-face-to-face time during this encounter.   Kerin Perna, NP

## 2019-01-22 NOTE — Progress Notes (Signed)
Pt complains of chest pains. They jump from right to left side. Feels like a stabbing pain. Patient believes it to be associated with GERD.

## 2019-01-23 ENCOUNTER — Emergency Department (HOSPITAL_COMMUNITY): Payer: Medicaid Other

## 2019-01-23 ENCOUNTER — Encounter (HOSPITAL_COMMUNITY): Payer: Self-pay | Admitting: Emergency Medicine

## 2019-01-23 ENCOUNTER — Emergency Department (HOSPITAL_COMMUNITY)
Admission: EM | Admit: 2019-01-23 | Discharge: 2019-01-23 | Disposition: A | Discharge status: Home / Self Care | Payer: Medicaid Other | Attending: Emergency Medicine | Admitting: Emergency Medicine

## 2019-01-23 DIAGNOSIS — R079 Chest pain, unspecified: Secondary | ICD-10-CM | POA: Diagnosis not present

## 2019-01-23 DIAGNOSIS — Z79899 Other long term (current) drug therapy: Secondary | ICD-10-CM | POA: Insufficient documentation

## 2019-01-23 DIAGNOSIS — R9431 Abnormal electrocardiogram [ECG] [EKG]: Secondary | ICD-10-CM | POA: Diagnosis not present

## 2019-01-23 DIAGNOSIS — F419 Anxiety disorder, unspecified: Secondary | ICD-10-CM | POA: Insufficient documentation

## 2019-01-23 DIAGNOSIS — R0789 Other chest pain: Secondary | ICD-10-CM | POA: Diagnosis not present

## 2019-01-23 LAB — BASIC METABOLIC PANEL
Anion gap: 7 (ref 5–15)
BUN: 9 mg/dL (ref 6–20)
CO2: 23 mmol/L (ref 22–32)
Calcium: 9 mg/dL (ref 8.9–10.3)
Chloride: 109 mmol/L (ref 98–111)
Creatinine, Ser: 0.73 mg/dL (ref 0.44–1.00)
GFR calc Af Amer: >60 mL/min (ref >60)
GFR calc non Af Amer: >60 mL/min (ref >60)
Glucose, Bld: 90 mg/dL (ref 70–99)
Potassium: 3.8 mmol/L (ref 3.5–5.1)
Sodium: 139 mmol/L (ref 135–145)

## 2019-01-23 LAB — TROPONIN I (HIGH SENSITIVITY): Troponin I (High Sensitivity): <2 ng/L (ref <18)

## 2019-01-23 LAB — CBC
HCT: 39.2 % (ref 36.0–46.0)
Hemoglobin: 11.9 g/dL — ABNORMAL LOW (ref 12.0–15.0)
MCH: 27.8 pg (ref 26.0–34.0)
MCHC: 30.4 g/dL (ref 30.0–36.0)
MCV: 91.6 fL (ref 80.0–100.0)
Platelets: 303 10*3/uL (ref 150–400)
RBC: 4.28 MIL/uL (ref 3.87–5.11)
RDW: 12.4 % (ref 11.5–15.5)
WBC: 6.1 10*3/uL (ref 4.0–10.5)
nRBC: 0 % (ref 0.0–0.2)

## 2019-01-23 LAB — D-DIMER, QUANTITATIVE: D-Dimer, Quant: 0.33 ug/mL-FEU (ref 0.00–0.50)

## 2019-01-23 LAB — I-STAT BETA HCG BLOOD, ED (MC, WL, AP ONLY): I-stat hCG, quantitative: <5 m[IU]/mL (ref <5)

## 2019-01-23 MED ORDER — SODIUM CHLORIDE 0.9% FLUSH: 3.0000 mL | Freq: Once | INTRAVENOUS | Status: DC

## 2019-01-23 NOTE — ED Notes (Signed)
Patient verbalizes understanding of discharge instructions. Opportunity for questioning and answers were provided. Armband removed by staff, pt discharged from ED.  

## 2019-01-23 NOTE — ED Triage Notes (Addendum)
Pt here for eval of 1 month of chest pain. Treated by MD for GERD but pain to chest is persistent. Location of pain is different daily. Pt has also had intermittent jaw pain.

## 2019-01-23 NOTE — ED Provider Notes (Signed)
[facility] EMERGENCY DEPARTMENT Provider Note   CSN: [id number] Arrival date & time: 01/23/19  1106    History   Chief Complaint Chief Complaint  Patient presents with  . Chest Pain    HPI Breanna Thomas is a 38 y.o. female.     HPI Patient states she is having roughly 1 month of chest pain.  States that the pain is "pinching" lasting only a few seconds.  Is been in multiple different spots of her chest.  Currently she is denying any chest pain.  She has no associated shortness of breath.  No cough, fever or chills.  She has mild ongoing lower extremity swelling which she states is unchanged.  No recent extended travel or immobilization.  Patient has Nexplanon implanted birth control.  Unknown family history.  Denies history of cigarette use.  Was seen in urgent care and started on omeprazole but she states she did not tolerate this because it made her nauseated. Past Medical History:  Diagnosis Date  . Anemia   . Anxiety   . Anxiety   . Panic attacks   . Vertigo     Patient Active Problem List   Diagnosis Date Noted  . GERD (gastroesophageal reflux disease) 04/20/2016  . Cough 04/20/2016  . IFG (impaired fasting glucose) 12/30/2013  . Unspecified vitamin D deficiency 12/30/2013  . Back muscle spasm 12/15/2013  . Anxiety 04/10/2013    Past Surgical History:  Procedure Laterality Date  . NO PAST SURGERIES       OB History    Gravida  3   Para  3   Term      Preterm      AB      Living        SAB      TAB      Ectopic      Multiple      Live Births               Home Medications    Prior to Admission medications   Medication Sig Start Date End Date Taking? Authorizing Provider  etonogestrel (IMPLANON) 68 MG IMPL implant Inject 1 each into the skin once.   Yes [provider]  Multiple Vitamin (MULTIVITAMIN) tablet Take 1 tablet by mouth daily.   Yes [provider]  pimecrolimus (ELIDEL) 1 % cream  Apply 1 application topically 2 (two) times daily.  09/08/18  Yes [provider]  sertraline (ZOLOFT) 100 MG tablet TAKE ONE AND A HALF TABLETS (150 MG TOTAL)  BY MOUTH DAILY. 11/27/17  Yes [name], NP  albuterol (PROVENTIL HFA;VENTOLIN HFA) 108 (90 Base) MCG/ACT inhaler Inhale 2 puffs into the lungs every 6 (six) hours as needed for wheezing or shortness of breath. Patient not taking: Reported on 01/22/2019 05/08/18   Augusto Gamble B, NP  omeprazole (PRILOSEC) 20 MG capsule Take 1 capsule (20 mg total) by mouth daily. Patient not taking: Reported on 01/22/2019 01/14/19   Sharion Balloon, NP    Family History Family History  Problem Relation Age of Onset  . Diabetes Mother   . Hypertension Mother   . Heart disease Father   . Seizures Father   . Seizures Other   . Heart failure Other   . Diabetes Sister     Social History Social History   Tobacco Use  . Smoking status: Never Smoker  . Smokeless tobacco: Never Used  Substance Use Topics  . Alcohol use:  Yes    Alcohol/week: 1.0 standard drinks    Types: 1 Glasses of wine per week    Comment: social  . Drug use: No     Allergies   Patient has no known allergies.   Review of Systems Review of Systems  Constitutional: Negative for chills, fatigue and fever.  HENT: Negative for sinus pressure, sore throat and trouble swallowing.   Eyes: Negative for visual disturbance.  Respiratory: Negative for cough and shortness of breath.   Cardiovascular: Positive for chest pain. Negative for palpitations and leg swelling.  Gastrointestinal: Negative for abdominal pain, constipation, diarrhea, nausea and vomiting.  Genitourinary: Negative for flank pain.  Musculoskeletal: Negative for back pain, myalgias and neck pain.  Skin: Negative for rash and wound.  Neurological: Negative for dizziness, syncope, weakness, light-headedness, numbness and headaches.  All other systems reviewed and are negative.    Physical Exam  Updated Vital Signs BP 112/67   Pulse 84   Temp 97.8 F (36.6 C) (Oral)   Resp 18   Ht 5\' 4"  (1.626 m)   Wt 120.7 kg   LMP 01/13/2019 (Approximate)   SpO2 100%   BMI 45.66 kg/m   Physical Exam Vitals signs and nursing note reviewed.  Constitutional:      Appearance: Normal appearance. She is well-developed.  HENT:     Head: Normocephalic and atraumatic.     Nose: Nose normal.     Mouth/Throat:     Mouth: Mucous membranes are moist.  Eyes:     Pupils: Pupils are equal, round, and reactive to light.  Neck:     Musculoskeletal: Normal range of motion and neck supple. No neck rigidity or muscular tenderness.  Cardiovascular:     Rate and Rhythm: Normal rate and regular rhythm.     Heart sounds: No murmur. No friction rub. No gallop.   Pulmonary:     Effort: Pulmonary effort is normal. No respiratory distress.     Breath sounds: Normal breath sounds. No stridor. No wheezing, rhonchi or rales.  Chest:     Chest wall: No tenderness.  Abdominal:     General: Bowel sounds are normal.     Palpations: Abdomen is soft.     Tenderness: There is no abdominal tenderness. There is no right CVA tenderness, left CVA tenderness, guarding or rebound.  Musculoskeletal: Normal range of motion.        General: No swelling, tenderness, deformity or signs of injury.     Right lower leg: Edema present.     Left lower leg: Edema present.     Comments: Mild bilateral lower extremity ankle edema.  No calf asymmetry or tenderness.  Distal pulses intact.  Lymphadenopathy:     Cervical: No cervical adenopathy.  Skin:    General: Skin is warm and dry.     Capillary Refill: Capillary refill takes less than 2 seconds.     Findings: No erythema or rash.  Neurological:     General: No focal deficit present.     Mental Status: She is alert and oriented to person, place, and time.     Comments: Moves all extremities without focal deficit.  Sensation intact.  Psychiatric:        Mood and Affect: Mood  normal.        Behavior: Behavior normal.      ED Treatments / Results  Labs (all labs ordered are listed, but only abnormal results are displayed) Labs Reviewed  CBC - Abnormal; Notable for  the following components:      Result Value   Hemoglobin 11.9 (*)    All other components within normal limits  BASIC METABOLIC PANEL  TROPONIN I (HIGH SENSITIVITY)  D-DIMER, QUANTITATIVE (NOT AT [facility])  I-STAT BETA HCG BLOOD, ED ([facility], [facility], [facility] ONLY)    EKG EKG Interpretation  Date/Time:  Friday January 23 2019 11:10:30 EDT Ventricular Rate:  82 PR Interval:  134 QRS Duration: 88 QT Interval:  376 QTC Calculation: 439 R Axis:   35 Text Interpretation:  Normal sinus rhythm ST & T wave abnormality, consider inferolateral ischemia Abnormal ECG Confirmed by Lacretia Leigh ([id number]) on 01/23/2019 11:20:08 AM   Radiology Dg Chest 2 View  Result Date: 01/23/2019 CLINICAL DATA:  Chest pain and pressure for the past month. EXAM: CHEST - 2 VIEW COMPARISON:  Chest x-ray dated May 08, 2018. FINDINGS: The heart size and mediastinal contours are within normal limits. Both lungs are clear. The visualized skeletal structures are unremarkable. IMPRESSION: No active cardiopulmonary disease. Electronically Signed   By: Titus Dubin M.D.   On: 01/23/2019 11:48    Procedures Procedures (including critical care time)  Medications Ordered in ED Medications  sodium chloride flush (NS) 0.9 % injection 3 mL (3 mLs Intravenous Not Given 01/23/19 1448)     Initial Impression / Assessment and Plan / ED Course  I have reviewed the triage vital signs and the nursing notes.  Pertinent labs & imaging results that were available during my care of the patient were reviewed by me and considered in my medical decision making (see chart for details).       Patient's troponin and d-dimer are normal.  Patient does appear to have some T wave inversions in her lateral and inferior leads.  Discussed with cardiologist  on-call who reviewed patient's EKG and prior ones.  Advises that the patient gets an echo as an outpatient to look for LVH.  Does not believe that she needs further work-up.  Patient heart score is 2.  Chest pain will be very atypical for ACS.  Discussed with patient the need to follow-up as an outpatient and strict return precautions have been given.   Final Clinical Impressions(s) / ED Diagnoses   Final diagnoses:  Atypical chest pain  Abnormal EKG    ED Discharge Orders    None       Julianne Rice, MD 01/23/19 1524

## 2019-01-26 ENCOUNTER — Telehealth (INDEPENDENT_AMBULATORY_CARE_PROVIDER_SITE_OTHER): Payer: Self-pay

## 2019-01-26 ENCOUNTER — Other Ambulatory Visit: Payer: Self-pay

## 2019-01-26 ENCOUNTER — Emergency Department (HOSPITAL_COMMUNITY)
Admission: EM | Admit: 2019-01-26 | Discharge: 2019-01-26 | Disposition: A | Discharge status: Home / Self Care | Payer: Medicaid Other | Attending: Emergency Medicine | Admitting: Emergency Medicine

## 2019-01-26 ENCOUNTER — Encounter (HOSPITAL_COMMUNITY): Payer: Self-pay

## 2019-01-26 DIAGNOSIS — R079 Chest pain, unspecified: Secondary | ICD-10-CM

## 2019-01-26 DIAGNOSIS — K219 Gastro-esophageal reflux disease without esophagitis: Secondary | ICD-10-CM | POA: Diagnosis not present

## 2019-01-26 DIAGNOSIS — R0789 Other chest pain: Secondary | ICD-10-CM | POA: Diagnosis not present

## 2019-01-26 DIAGNOSIS — D34 Benign neoplasm of thyroid gland: Secondary | ICD-10-CM | POA: Insufficient documentation

## 2019-01-26 DIAGNOSIS — Z79899 Other long term (current) drug therapy: Secondary | ICD-10-CM | POA: Diagnosis not present

## 2019-01-26 DIAGNOSIS — R202 Paresthesia of skin: Secondary | ICD-10-CM | POA: Diagnosis not present

## 2019-01-26 DIAGNOSIS — R42 Dizziness and giddiness: Secondary | ICD-10-CM | POA: Diagnosis not present

## 2019-01-26 DIAGNOSIS — R9431 Abnormal electrocardiogram [ECG] [EKG]: Secondary | ICD-10-CM | POA: Diagnosis not present

## 2019-01-26 DIAGNOSIS — F419 Anxiety disorder, unspecified: Secondary | ICD-10-CM | POA: Diagnosis not present

## 2019-01-26 LAB — CBC
HCT: 40.8 % (ref 36.0–46.0)
Hemoglobin: 12.4 g/dL (ref 12.0–15.0)
MCH: 28.1 pg (ref 26.0–34.0)
MCHC: 30.4 g/dL (ref 30.0–36.0)
MCV: 92.5 fL (ref 80.0–100.0)
Platelets: 308 10*3/uL (ref 150–400)
RBC: 4.41 MIL/uL (ref 3.87–5.11)
RDW: 12.4 % (ref 11.5–15.5)
WBC: 6.5 10*3/uL (ref 4.0–10.5)
nRBC: 0 % (ref 0.0–0.2)

## 2019-01-26 LAB — URINALYSIS, ROUTINE W REFLEX MICROSCOPIC
Bilirubin Urine: NEGATIVE
Glucose, UA: NEGATIVE mg/dL
Hgb urine dipstick: NEGATIVE
Ketones, ur: 5 mg/dL — AB
Leukocytes,Ua: NEGATIVE
Nitrite: NEGATIVE
Protein, ur: NEGATIVE mg/dL
Specific Gravity, Urine: 1.013 (ref 1.005–1.030)
pH: 8 (ref 5.0–8.0)

## 2019-01-26 LAB — BASIC METABOLIC PANEL
Anion gap: 7 (ref 5–15)
BUN: 8 mg/dL (ref 6–20)
CO2: 22 mmol/L (ref 22–32)
Calcium: 8.9 mg/dL (ref 8.9–10.3)
Chloride: 108 mmol/L (ref 98–111)
Creatinine, Ser: 0.7 mg/dL (ref 0.44–1.00)
GFR calc Af Amer: >60 mL/min (ref >60)
GFR calc non Af Amer: >60 mL/min (ref >60)
Glucose, Bld: 95 mg/dL (ref 70–99)
Potassium: 4 mmol/L (ref 3.5–5.1)
Sodium: 137 mmol/L (ref 135–145)

## 2019-01-26 LAB — I-STAT BETA HCG BLOOD, ED (MC, WL, AP ONLY): I-stat hCG, quantitative: <5 m[IU]/mL (ref <5)

## 2019-01-26 MED ORDER — KETOROLAC TROMETHAMINE 15 MG/ML IJ SOLN
15.0000 mg | Freq: Once | INTRAMUSCULAR | Status: AC
  Administered 2019-01-26: 15 mg via INTRAVENOUS
  Filled 2019-01-26: qty 1

## 2019-01-26 MED ORDER — NAPROXEN 500 MG PO TABS: 500.0000 mg | ORAL_TABLET | Freq: Two times a day (BID) | ORAL | 0 refills | Status: DC

## 2019-01-26 MED ORDER — SODIUM CHLORIDE 0.9 % IV BOLUS
1000.0000 mL | Freq: Once | INTRAVENOUS | Status: AC
  Administered 2019-01-26: 1000 mL via INTRAVENOUS

## 2019-01-26 MED ORDER — HYDROXYZINE HCL 25 MG PO TABS: 25.0000 mg | ORAL_TABLET | Freq: Four times a day (QID) | ORAL | 0 refills | Status: DC

## 2019-01-26 NOTE — ED Notes (Signed)
Pt verbalized understanding of d/c instructions and has no further questions, VSS, NAD.  

## 2019-01-26 NOTE — ED Notes (Signed)
Asked phlebotomy to complete blood draw.

## 2019-01-26 NOTE — ED Triage Notes (Signed)
Chest pain x 1 months, reports seen here on Friday.  Although she says the numbness and arm, she called to get appt for echo and they told her they have to get that approved before she can have it, so she came here because she is feeling dizzy.

## 2019-01-26 NOTE — ED Notes (Signed)
Pt  Reminded of the need for urine

## 2019-01-26 NOTE — Telephone Encounter (Signed)
Patient was recently seen in the ED for atypical chest pain with abnormal EKG. Needs referral to cardiology and order for echo. Breanna Thomas, CMA

## 2019-01-26 NOTE — ED Notes (Signed)
Two unsuccessful IV attempts.

## 2019-01-26 NOTE — ED Provider Notes (Signed)
[facility] EMERGENC[facility] DEPARTMENT Provider Note   CSN: [id number] Arrival date & time: 01/26/19  1015     History   Chief Complaint Chief Complaint  Patient presents with   Chest Pain    HPI Breanna BUTTREY is a 38 y.o. female with a hx of anemia, anxiety, panic attacks, & GERD who returns to the ER with complaints of worsened chest pain since last ED visit. Patient reports chest pain is intermittent, occurs 5-8 times per day, feels like a shooting/sharp pain which can occur to the left or right chest. She notes typically pain is brief, only a few seconds, then resolves, now having some mild residual ache for a bit after that. Seen in the ED for chest pain 3 days prior, had abnormal EKG delta trop & d-dimer negative, cardiology was consulted & recommended outpatient echo to check for LVH. She called PCP today who is working on scheduling this. She notes pain was worse, she had some lightheadedness with standing & that she developed RUE paresthesias overall prompting ER visit. Other than standing worsening lightheadedness, & tylenol somewhat alleviating chest pain no alleviating/aggravating factors to her overall sxs. No exertional/pleuritic component to chest pain. The RUE paresthesias start in the trapezius area & radiates down. She cannot recall a traumatic injury or a change in activity. Reports some nausea with vomiting after taking omeprazole which was started by UC, has since stopped taking this medicine- no other emesis. Reports anxiety in general regarding everything going on symptom wise- no SI/HI. She denies fever, chills, dyspnea, syncope, dizzy like the room spinning, cough, abdominal pain, diarrhea, unilateral leg pain/swelling, hemoptysis, recent surgery/trauma, recent long travel, OCP use, personal hx of cancer, or hx of DVT/PE. Uses nexplanon for birth control & has chronic unchanged BLE ankle swelling.      HPI  Past Medical History:  Diagnosis Date    Anemia    Anxiety    Anxiety    Panic attacks    Vertigo     Patient Active Problem List   Diagnosis Date Noted   Erroneous encounter - disregard 01/25/2019   GERD (gastroesophageal reflux disease) 04/20/2016   Cough 04/20/2016   IFG (impaired fasting glucose) 12/30/2013   Unspecified vitamin D deficiency 12/30/2013   Back muscle spasm 12/15/2013   Anxiety 04/10/2013    Past Surgical History:  Procedure Laterality Date   NO PAST SURGERIES       OB History    Gravida  3   Para  3   Term      Preterm      AB      Living        SAB      TAB      Ectopic      Multiple      Live Births               Home Medications    Prior to Admission medications   Medication Sig Start Date End Date Taking? Authorizing Provider  albuterol (PROVENTIL HFA;VENTOLIN HFA) 108 (90 Base) MCG/ACT inhaler Inhale 2 puffs into the lungs every 6 (six) hours as needed for wheezing or shortness of breath. Patient not taking: Reported on 01/22/2019 05/08/18   Augusto Gamble B, NP  etonogestrel (IMPLANON) 68 MG IMPL implant Inject 1 each into the skin once.    [provider]  Multiple Vitamin (MULTIVITAMIN) tablet Take 1 tablet by mouth daily.    [provider]  omeprazole (  PRILOSEC) 20 MG capsule Take 1 capsule (20 mg total) by mouth daily. Patient not taking: Reported on 01/22/2019 01/14/19   Sharion Balloon, NP  pimecrolimus (ELIDEL) 1 % cream Apply 1 application topically 2 (two) times daily.  09/08/18   [provider]  sertraline (ZOLOFT) 100 MG tablet TAKE ONE AND A HALF TABLETS (150 MG TOTAL)  BY MOUTH DAILY. 11/27/17   [name], NP    Family History Family History  Problem Relation Age of Onset   Diabetes Mother    Hypertension Mother    Heart disease Father    Seizures Father    Seizures Other    Heart failure Other    Diabetes Sister     Social History Social History   Tobacco Use   Smoking status: Never Smoker     Smokeless tobacco: Never Used  Substance Use Topics   Alcohol use: Yes    Alcohol/week: 1.0 standard drinks    Types: 1 Glasses of wine per week    Comment: social   Drug use: No     Allergies   Patient has no known allergies.   Review of Systems Review of Systems  Constitutional: Negative for chills and fever.  Respiratory: Negative for shortness of breath.   Cardiovascular: Positive for chest pain and leg swelling (chronic unchanged to feet). Negative for palpitations.  Gastrointestinal: Positive for nausea and vomiting (resolved). Negative for abdominal pain, blood in stool, constipation and diarrhea.  Neurological: Positive for light-headedness. Negative for dizziness, seizures, syncope, speech difficulty, weakness and headaches.       + for RUE paresthesias.   All other systems reviewed and are negative.    Physical Exam Updated Vital Signs Temp 98.3 F (36.8 C) (Oral)    Ht 5\' 4"  (1.626 m)    Wt 111.6 kg    LMP 01/13/2019 (Approximate)    BMI 42.23 kg/m   Physical Exam Vitals signs and nursing note reviewed.  Constitutional:      General: She is not in acute distress.    Appearance: She is well-developed. She is not toxic-appearing.  HENT:     Head: Normocephalic and atraumatic.  Eyes:     General:        Right eye: No discharge.        Left eye: No discharge.     Extraocular Movements: Extraocular movements intact.     Conjunctiva/sclera: Conjunctivae normal.     Pupils: Pupils are equal, round, and reactive to light.  Neck:     Musculoskeletal: Neck supple. Muscular tenderness (R trapezius area) present. No edema, erythema, neck rigidity, crepitus or spinous process tenderness.     Comments: ROM intact Cardiovascular:     Rate and Rhythm: Normal rate and regular rhythm.  Pulmonary:     Effort: Pulmonary effort is normal. No respiratory distress.     Breath sounds: Normal breath sounds. No wheezing, rhonchi or rales.  Chest:     Chest wall:  Tenderness (R anterior chest) present. No crepitus.     Comments: No rashes Abdominal:     General: There is no distension.     Palpations: Abdomen is soft.     Tenderness: There is no abdominal tenderness.  Musculoskeletal:     Right lower leg: She exhibits no tenderness. No edema.     Left lower leg: She exhibits no tenderness. No edema.     Comments: Upper extremities: Intact active range of motion throughout without point/focal bony tenderness.  Skin:    General: Skin is warm and dry.     Findings: No rash.  Neurological:     Mental Status: She is alert.     Comments: Clear speech.  CN III through XII grossly intact.  Sensation grossly intact bilateral upper and lower extremities.  5 out of 5 symmetric grip strength.  5 out of 5 strength with plantar dorsiflexion bilaterally.  Patient is ambulatory.  Psychiatric:        Behavior: Behavior normal.      ED Treatments / Results  Labs (all labs ordered are listed, but only abnormal results are displayed) Labs Reviewed  URINALYSIS, ROUTINE W REFLEX MICROSCOPIC - Abnormal; Notable for the following components:      Result Value   Ketones, ur 5 (*)    All other components within normal limits  CBC  BASIC METABOLIC PANEL  I-STAT BETA HCG BLOOD, ED ([facility], [facility], [facility] ONLY)    EKG EKG Interpretation  Date/Time:  Monday January 26 2019 10:32:16 EDT Ventricular Rate:  84 PR Interval:    QRS Duration: 99 QT Interval:  360 QTC Calculation: 426 R Axis:   45 Text Interpretation:  Sinus rhythm Abnormal R-wave progression, early transition Borderline repolarization abnormality ST-t wave abnormality No significant change since last tracing Abnormal ECG Confirmed by Carmin Muskrat [phone]) on 01/26/2019 10:54:49 AM   Radiology No results found.  Procedures Procedures (including critical care time)  Medications Ordered in ED Medications  sodium chloride 0.9 % bolus 1,000 mL (0 mLs Intravenous Stopped 01/26/19 1432)  ketorolac (TORADOL) 15  MG/ML injection 15 mg (15 mg Intravenous Given 01/26/19 1247)     Initial Impression / Assessment and Plan / ED Course  I have reviewed the triage vital signs and the nursing notes.  Pertinent labs & imaging results that were available during my care of the patient were reviewed by me and considered in my medical decision making (see chart for details).   Patient returns to the ER with complaints of worsened intermittent chest pain as well as lightheadedness w/ position changes & RUE paresthesias. Nontoxic appearing, no apparent distress, vitals WNL. Plan for cardiac monitor, EKG, & basic labs. Toradol ordered for pain control.   CBC: No anemia or leukocytosis.  BMP: No significant electrolyte derangement.  preg test: negative.  UA : No UTI, minimal ketones.  EKG: No significant change from prior.  Orthostatic vitals: Negative  Per chart review: Seen in the ED for chest pain 3 days prior, had abnormal EKG delta trop & d-dimer negative, cardiology was consulted & recommended outpatient echo to check for LVH. Pain does not seem consistent w/ ACS, given delta trop negative a few days prior with no significant change in EKG do not feel troponins need repeating. Additional d-dimer the other day was negative, low risk wells, PERC negative- doubt PE. CXR reviewed from prior visit- no active cardiopulmonary disease- do not feel this needs repeating given clear & equal breath sounds- doubt PTX, pneumonia, or fluid overload. Cardiac monitor without concerning arrhythmias. No abdominal peritoneal signs. Regarding RUE paresthesias- seems muscular w/ radiculopathy given R trapezius tenderness, no focal deficits on exam- does not seem consistent w/ stroke, symmetric pulses no deficits on exam w/ normal BP- doubt dissection. She is feeling improved following toradol. Unclear definitive etiology to her sxs, but overall reassuring work-up, will send home w/ naproxen for MSK tx & atarax for anxiety. I discussed  results, treatment plan, need for follow-up, and return precautions with the patient.  Provided opportunity for questions, patient confirmed understanding and is in agreement with plan.    Findings and plan of care discussed with supervising physician Dr. Vanita Panda who evaluated patient & is in agreement.   Blood pressure 118/71, pulse 91, temperature 98.3 F (36.8 C), temperature source Oral, resp. rate 19, height 5\' 4"  (1.626 m), weight 111.6 kg, last menstrual period 01/13/2019, SpO2 100 %.  Final Clinical Impressions(s) / ED Diagnoses   Final diagnoses:  Chest pain, unspecified type    ED Discharge Orders         Ordered    naproxen (NAPROSYN) 500 MG tablet  2 times daily     01/26/19 1450    hydrOXYzine (ATARAX/VISTARIL) 25 MG tablet  Every 6 hours     01/26/19 [address], [place], PA-C 01/26/19 1517    Carmin Muskrat, MD 01/27/19 3748    Carmin Muskrat, MD 01/27/19 [phone]

## 2019-01-26 NOTE — Discharge Instructions (Signed)
You were seen in the emergency department today for chest pain. Your work-up in the emergency department has been overall reassuring. Your labs have been fairly normal and or similar to previous blood work you have had done. Your EKG did not show an acute heart attack at this time.   We are sending home with Atarax and with naproxen. - Naproxen is a nonsteroidal anti-inflammatory medication that will help with pain and swelling. Be sure to take this medication as prescribed with food, 1 pill every 12 hours,  It should be taken with food, as it can cause stomach upset, and more seriously, stomach bleeding. Do not take other nonsteroidal anti-inflammatory medications with this such as Advil, Motrin, Aleve, Mobic, Goodie Powder, or Motrin.    - Atarax- take every 6 hours as needed for anxiety.  You make take Tylenol per over the counter dosing with these medications.   We have prescribed you new medication(s) today. Discuss the medications prescribed today with your pharmacist as they can have adverse effects and interactions with your other medicines including over the counter and prescribed medications. Seek medical evaluation if you start to experience new or abnormal symptoms after taking one of these medicines, seek care immediately if you start to experience difficulty breathing, feeling of your throat closing, facial swelling, or rash as these could be indications of a more serious allergic reaction   We would like you to follow up closely with your primary care provider within 1-3 days. Return to the ER immediately should you experience any new or worsening symptoms including but not limited to return of pain, worsened pain, vomiting, shortness of breath, dizziness, lightheadedness, passing out, or any other concerns that you may have.

## 2019-01-29 ENCOUNTER — Other Ambulatory Visit (INDEPENDENT_AMBULATORY_CARE_PROVIDER_SITE_OTHER): Payer: Self-pay | Admitting: Primary Care

## 2019-01-29 ENCOUNTER — Telehealth: Payer: Self-pay | Admitting: Physician Assistant

## 2019-01-29 ENCOUNTER — Inpatient Hospital Stay: Payer: Medicaid Other | Admitting: Critical Care Medicine

## 2019-01-29 DIAGNOSIS — R079 Chest pain, unspecified: Secondary | ICD-10-CM

## 2019-01-29 NOTE — Telephone Encounter (Signed)
Referred to cardiology.

## 2019-01-29 NOTE — Progress Notes (Deleted)
Subjective:    Patient ID: Breanna Thomas, female    DOB: 1981/03/26, 38 y.o.   MRN: [id number]    Medical screening examination/treatment/procedure(s) were conducted as a shared visit with non-physician practitioner(s) and myself.  I personally evaluated the patient during the encounter.     On my exam this young female was awake, alert, in no distress, had a variety of complaints, migratory, including chest pain, trapezius pain, but findings here were reassuring, no evidence for acute new pathology.     EKG Interpretation     Date/Time:                  Monday January 26 2019 10:32:16 EDT  Ventricular Rate:         84  PR Interval:                    QRS Duration: 99  QT Interval:                 360  QTC Calculation:        426  R Axis:                         45  Text Interpretation:       Sinus rhythm Abnormal R-wave progression, early transition Borderline repolarization abnormality ST-t wave abnormality No significant change since last tracing Abnormal ECG Confirmed by Carmin Muskrat [phone]) on 01/26/2019 10:54:49 AM           EKG: EKG Interpretation     Date/Time:                  Monday January 26 2019 10:32:16 EDT  Ventricular Rate:         84  PR Interval:                    QRS Duration: 99  QT Interval:                 360  QTC Calculation:        426  R Axis:                         45  Text Interpretation:       Sinus rhythm Abnormal R-wave progression, early transition Borderline repolarization abnormality ST-t wave abnormality No significant change since last tracing Abnormal ECG Confirmed by Carmin Muskrat [phone]) on 01/26/2019 10:54:49 AM                                  Expand All Collapse All           untitled image  [place]  Provider Note        CSN: [id number]  Arrival date & time: 01/26/19  1015            History                Chief Complaint       Chief Complaint    Patient presents with    .   Chest Pain         HPI  Breanna Thomas is a 38 y.o. female with a hx of anemia, anxiety, panic attacks, & GERD who returns to the ER with complaints of worsened chest pain since last ED  visit. Patient reports chest pain is intermittent, occurs 5-8 times per day, feels like a shooting/sharp pain which can occur to the left or right chest. She notes typically pain is brief, only a few seconds, then resolves, now having some mild residual ache for a bit after that. Seen in the ED for chest pain 3 days prior, had abnormal EKG delta trop & d-dimer negative, cardiology was consulted & recommended outpatient echo to check for LVH. She called PCP today who is working on scheduling this. She notes pain was worse, she had some lightheadedness with standing & that she developed RUE paresthesias overall prompting ER visit. Other than standing worsening lightheadedness, & tylenol somewhat alleviating chest pain no alleviating/aggravating factors to her overall sxs. No exertional/pleuritic component to chest pain. The RUE paresthesias start in the trapezius area & radiates down. She cannot recall a traumatic injury or a change in activity. Reports some nausea with vomiting after taking omeprazole which was started by UC, has since stopped taking this medicine- no other emesis. Reports anxiety in general regarding everything going on symptom wise- no SI/HI. She denies fever, chills, dyspnea, syncope, dizzy like the room spinning, cough, abdominal pain, diarrhea, unilateral leg pain/swelling, hemoptysis, recent surgery/trauma, recent long travel, OCP use, personal hx of cancer, or hx of DVT/PE. Uses nexplanon for birth control & has chronic unchanged BLE ankle swelling.            HPI          Past Medical History:    Diagnosis   Date    .   Anemia        .   Anxiety        .   Anxiety        .   Panic attacks         .   Vertigo                  Patient Active Problem List        Diagnosis   Date Noted    .   Erroneous encounter - disregard   01/25/2019    .   GERD (gastroesophageal reflux disease)   04/20/2016    .   Cough   04/20/2016    .   IFG (impaired fasting glucose)   12/30/2013    .   Unspecified vitamin D deficiency   12/30/2013    .   Back muscle spasm   12/15/2013    .   Anxiety   04/10/2013               Past Surgical History:    Procedure   Laterality   Date    .   NO PAST SURGERIES                                      OB History            Gravida    3        Para    3        Term             Preterm             AB             Living  SAB             TAB             Ectopic             Multiple             Live Births                                  Home Medications                           Prior to Admission medications     Medication   Sig   Start Date   End Date   Taking?   Authorizing Provider    albuterol (PROVENTIL HFA;VENTOLIN HFA) 108 (90 Base) MCG/ACT inhaler   Inhale 2 puffs into the lungs every 6 (six) hours as needed for wheezing or shortness of breath.  Patient not taking: Reported on 01/22/2019   05/08/18           Augusto Gamble B, NP    etonogestrel (IMPLANON) 68 MG IMPL implant   Inject 1 each into the skin once.               [provider]    Multiple Vitamin (MULTIVITAMIN) tablet   Take 1 tablet by mouth daily.               [provider]    omeprazole (PRILOSEC) 20 MG capsule   Take 1 capsule (20 mg total) by mouth daily.  Patient not taking: Reported on 01/22/2019   01/14/19           Sharion Balloon, NP    pimecrolimus (ELIDEL) 1 % cream   Apply  1 application topically 2 (two) times daily.    09/08/18           [provider]    sertraline (ZOLOFT) 100 MG tablet   TAKE ONE AND A HALF TABLETS (150 MG TOTAL)  BY MOUTH DAILY.   11/27/17           [name], NP         Family History        Family History    Problem   Relation   Age of Onset    .   Diabetes   Mother        .   Hypertension   Mother        .   Heart disease   Father        .   Seizures   Father        .   Seizures   Other        .   Heart failure   Other        .   Diabetes   Sister             Social History   Social History             Tobacco Use    .   Smoking status:   Never Smoker    .   Smokeless tobacco:   Never Used    Substance Use Topics    .   Alcohol use:   Yes            Alcohol/week:   1.0 standard  drinks            Types:   1 Glasses of wine per week            Comment: social    .   Drug use:   No             Allergies                Patient has no known allergies.        Review of Systems  Review of Systems   Constitutional: Negative for chills and fever.   Respiratory: Negative for shortness of breath.    Cardiovascular: Positive for chest pain and leg swelling (chronic unchanged to feet). Negative for palpitations.   Gastrointestinal: Positive for nausea and vomiting (resolved). Negative for abdominal pain, blood in stool, constipation and diarrhea.   Neurological: Positive for light-headedness. Negative for dizziness, seizures, syncope, speech difficulty, weakness and headaches.        + for RUE paresthesias.    All other systems reviewed and are negative.           Physical Exam  Updated Vital Signs  Temp 98.3 F (36.8 C) (Oral)   Ht 5\' 4"  (1.626 m)   Wt 111.6 kg   LMP 01/13/2019 (Approximate)   BMI 42.23 kg/m      Physical Exam   Vitals signs and nursing note reviewed.  Constitutional:       General: She is not in acute distress.    Appearance: She is well-developed. She is not toxic-appearing.  HENT:      Head: Normocephalic and atraumatic.  Eyes:      General:        Right eye: No discharge.        Left eye: No discharge.     Extraocular Movements: Extraocular movements intact.     Conjunctiva/sclera: Conjunctivae normal.     Pupils: Pupils are equal, round, and reactive to light.  Neck:      Musculoskeletal: Neck supple. Muscular tenderness (R trapezius area) present. No edema, erythema, neck rigidity, crepitus or spinous process tenderness.     Comments: ROM intact Cardiovascular:      Rate and Rhythm: Normal rate and regular rhythm.  Pulmonary:      Effort: Pulmonary effort is normal. No respiratory distress.     Breath sounds: Normal breath sounds. No wheezing, rhonchi or rales.  Chest:      Chest wall: Tenderness (R anterior chest) present. No crepitus.     Comments: No rashes Abdominal:     General: There is no distension.     Palpations: Abdomen is soft.     Tenderness: There is no abdominal tenderness.   Musculoskeletal:     Right lower leg: She exhibits no tenderness. No edema.     Left lower leg: She exhibits no tenderness. No edema.     Comments: Upper extremities: Intact active range of motion throughout without point/focal bony tenderness.   Skin:     General: Skin is warm and dry.     Findings: No rash.  Neurological:      Mental Status: She is alert.     Comments: Clear speech.  CN III through XII grossly intact.  Sensation grossly intact bilateral upper and lower extremities.  5 out of 5 symmetric grip strength.  5 out of 5 strength with plantar dorsiflexion bilaterally.  Patient is ambulatory.  Psychiatric:        Behavior: Behavior normal.  ED Treatments / Results    Labs  (all labs ordered are listed, but only abnormal results are displayed)         Labs Reviewed    URINALYSIS, ROUTINE W REFLEX MICROSCOPIC - Abnormal; Notable for the following components:        Result   Value        Ketones, ur   5 (*)            All other components within normal limits    CBC    BASIC METABOLIC PANEL    I-STAT BETA HCG BLOOD, ED ([facility], [facility], [facility] ONLY)         EKG  EKG Interpretation     Date/Time:                  Monday January 26 2019 10:32:16 EDT  Ventricular Rate:         84  PR Interval:                    QRS Duration: 99  QT Interval:                 360  QTC Calculation:        426  R Axis:                         45  Text Interpretation:       Sinus rhythm Abnormal R-wave progression, early transition Borderline repolarization abnormality ST-t wave abnormality No significant change since last tracing Abnormal ECG Confirmed by Carmin Muskrat [phone]) on 01/26/2019 10:54:49 AM       Radiology   Imaging Results (Last 48 hours)          Procedures  Procedures (including critical care time)     Medications Ordered in ED   Medications    sodium chloride 0.9 % bolus 1,000 mL (0 mLs Intravenous Stopped 01/26/19 1432)    ketorolac (TORADOL) 15 MG/ML injection 15 mg (15 mg Intravenous Given 01/26/19 1247)             Initial Impression / Assessment and Plan / ED Course    I have reviewed the triage vital signs and the nursing notes.     Pertinent labs & imaging results that were available during my care of the patient were reviewed by me and considered in my medical decision making (see chart for details).      Patient returns to the ER with complaints of worsened intermittent chest pain as well as lightheadedness w/ position changes & RUE paresthesias. Nontoxic appearing, no apparent distress, vitals WNL. Plan for cardiac monitor, EKG, & basic labs. Toradol ordered for pain control.      CBC: No anemia or leukocytosis.   BMP: No significant electrolyte  derangement.   preg test: negative.   UA : No UTI, minimal ketones.   EKG: No significant change from prior.   Orthostatic vitals: Negative     Per chart review: Seen in the ED for chest pain 3 days prior, had abnormal EKG delta trop & d-dimer negative, cardiology was consulted & recommended outpatient echo to check for LVH. Pain does not seem consistent w/ ACS, given delta trop negative a few days prior with no significant change in EKG do not feel troponins need repeating. Additional d-dimer the other day was negative, low risk wells, PERC negative- doubt PE. CXR reviewed from prior visit- no active cardiopulmonary  disease- do not feel this needs repeating given clear & equal breath sounds- doubt PTX, pneumonia, or fluid overload. Cardiac monitor without concerning arrhythmias. No abdominal peritoneal signs. Regarding RUE paresthesias- seems muscular w/ radiculopathy given R trapezius tenderness, no focal deficits on exam- does not seem consistent w/ stroke, symmetric pulses no deficits on exam w/ normal BP- doubt dissection. She is feeling improved following toradol. Unclear definitive etiology to her sxs, but overall reassuring work-up, will send home w/ naproxen for MSK tx & atarax for anxiety. I discussed results, treatment plan, need for follow-up, and return precautions with the patient. Provided opportunity for questions, patient confirmed understanding and is in agreement with plan.         Findings and plan of care discussed with supervising physician Dr. Vanita Panda who evaluated patient & is in agreement.      Blood pressure 118/71, pulse 91, temperature 98.3 F (36.8 C), temperature source Oral, resp. rate 19, height 5\' 4"  (1.626 m), weight 111.6 kg, last menstrual period 01/13/2019, SpO2 100 %.            Review of Systems     Objective:   Physical Exam        Assessment & Plan:

## 2019-01-29 NOTE — Telephone Encounter (Signed)
Refer to cardiology 

## 2019-01-29 NOTE — Telephone Encounter (Signed)
FWD to PCP. Breanna Thomas Breanna Thomas, CMA  

## 2019-01-29 NOTE — Telephone Encounter (Signed)
Pt would like to be referred for a heart echo ultrasound..please follow up

## 2019-01-30 NOTE — Telephone Encounter (Signed)
Patient is aware that referral has been placed. Nat Christen, CMA

## 2019-02-10 ENCOUNTER — Other Ambulatory Visit: Payer: Self-pay

## 2019-02-10 ENCOUNTER — Ambulatory Visit (HOSPITAL_COMMUNITY)
Admission: EM | Admit: 2019-02-10 | Discharge: 2019-02-10 | Disposition: A | Discharge status: Home / Self Care | Payer: Medicaid Other | Attending: Family Medicine | Admitting: Family Medicine

## 2019-02-10 ENCOUNTER — Encounter (HOSPITAL_COMMUNITY): Payer: Self-pay

## 2019-02-10 DIAGNOSIS — Z760 Encounter for issue of repeat prescription: Secondary | ICD-10-CM

## 2019-02-10 DIAGNOSIS — R0789 Other chest pain: Secondary | ICD-10-CM | POA: Diagnosis not present

## 2019-02-10 MED ORDER — HYDROXYZINE HCL 25 MG PO TABS: 25.0000 mg | ORAL_TABLET | Freq: Four times a day (QID) | ORAL | 0 refills | Status: DC

## 2019-02-10 NOTE — ED Provider Notes (Signed)
[place]    CSN: [id number] Arrival date & time: 02/10/19  1035     History   Chief Complaint Chief Complaint  Patient presents with  . Chest Pain    HPI Breanna Thomas is a 38 y.o. female history of anxiety, GERD, presenting today for evaluation of chest pain medication refill.  Patient states that over the past 1 to 2 months she has had intermittent brief pinching sensations in her chest that migrate.  Symptoms last for a few seconds and then relieved.  She has been seen in the emergency room twice with negative work-ups.  Recommended outpatient follow-up with cardiology for echo.  She was discharged home with Naprosyn and hydroxyzine.  Patient states that she recently ran out of the hydroxyzine while she was taking that she has decreased frequency of her symptoms.  Over the past day she has had worsening/more frequent symptoms.  Symptoms similar nature as prior chest discomfort.  She notes that she did also recently start working again which may has increased her stress.  She has plans to follow-up with cardiology on 8/10.  She has follow-up with [facility] on 7/24.  Denies associated nausea or vomiting.  Denies associated shortness of breath.  Notes that she has lower leg swelling, but this is normal for her.  Denies recent travel or immobilization.  Denies tobacco use.  Does use Nexplanon.  Denies previous DVT/PE.  HPI  Past Medical History:  Diagnosis Date  . Anemia   . Anxiety   . Anxiety   . Panic attacks   . Vertigo     Patient Active Problem List   Diagnosis Date Noted  . Erroneous encounter - disregard 01/25/2019  . GERD (gastroesophageal reflux disease) 04/20/2016  . Cough 04/20/2016  . IFG (impaired fasting glucose) 12/30/2013  . Unspecified vitamin D deficiency 12/30/2013  . Back muscle spasm 12/15/2013  . Anxiety 04/10/2013    Past Surgical History:  Procedure Laterality Date  . NO PAST SURGERIES      OB History    Gravida  3   Para  3   Term      Preterm      AB      Living        SAB      TAB      Ectopic      Multiple      Live Births               Home Medications    Prior to Admission medications   Medication Sig Start Date End Date Taking? Authorizing Provider  acetaminophen (TYLENOL) 500 MG tablet Take 1,000 mg by mouth every 6 (six) hours as needed for moderate pain.    [provider]  albuterol (PROVENTIL HFA;VENTOLIN HFA) 108 (90 Base) MCG/ACT inhaler Inhale 2 puffs into the lungs every 6 (six) hours as needed for wheezing or shortness of breath. 05/08/18   Zigmund Gottron, NP  etonogestrel (IMPLANON) 68 MG IMPL implant Inject 1 each into the skin once.    [provider]  hydrOXYzine (ATARAX/VISTARIL) 25 MG tablet Take 1 tablet (25 mg total) by mouth every 6 (six) hours. 02/10/19   ,  C, PA-C  Multiple Vitamin (MULTIVITAMIN) tablet Take 1 tablet by mouth daily.    [provider]  Multiple Vitamins-Minerals (AIRBORNE GUMMIES PO) Take 3 tablets by mouth daily.    [provider]  naproxen (NAPROSYN) 500 MG tablet Take 1  tablet (500 mg total) by mouth 2 (two) times daily. 01/26/19   Petrucelli, Samantha R, PA-C  omeprazole (PRILOSEC) 20 MG capsule Take 1 capsule (20 mg total) by mouth daily. Patient not taking: Reported on 01/22/2019 01/14/19   Sharion Balloon, NP  pimecrolimus (ELIDEL) 1 % cream Apply 1 application topically 2 (two) times daily.  09/08/18   [provider]  sertraline (ZOLOFT) 100 MG tablet TAKE ONE AND A HALF TABLETS (150 MG TOTAL)  BY MOUTH DAILY. Patient taking differently: Take 150 mg by mouth daily.  11/27/17   [name], NP    Family History Family History  Problem Relation Age of Onset  . Diabetes Mother   . Hypertension Mother   . Heart disease Father   . Seizures Father   . Seizures Other   . Heart failure Other   . Diabetes Sister     Social History Social History   Tobacco  Use  . Smoking status: Never Smoker  . Smokeless tobacco: Never Used  Substance Use Topics  . Alcohol use: Not Currently    Alcohol/week: 1.0 standard drinks    Types: 1 Glasses of wine per week    Comment: social  . Drug use: No     Allergies   Patient has no known allergies.   Review of Systems Review of Systems  Constitutional: Negative for fatigue and fever.  HENT: Negative for congestion, sinus pressure and sore throat.   Eyes: Negative for photophobia, pain and visual disturbance.  Respiratory: Negative for cough and shortness of breath.   Cardiovascular: Positive for chest pain.  Gastrointestinal: Negative for abdominal pain, nausea and vomiting.  Genitourinary: Negative for decreased urine volume and hematuria.  Musculoskeletal: Negative for myalgias, neck pain and neck stiffness.  Neurological: Negative for dizziness, syncope, facial asymmetry, speech difficulty, weakness, light-headedness, numbness and headaches.     Physical Exam Triage Vital Signs ED Triage Vitals  Enc Vitals Group     BP 02/10/19 1053 118/87     Pulse Rate 02/10/19 1053 83     Resp 02/10/19 1053 17     Temp 02/10/19 1053 98.5 F (36.9 C)     Temp Source 02/10/19 1053 Oral     SpO2 02/10/19 1053 98 %     Weight --      Height --      Head Circumference --      Peak Flow --      Pain Score 02/10/19 1050 0     Pain Loc --      Pain Edu? --      Excl. in [place]? --    No data found.  Updated Vital Signs BP 118/87 (BP Location: Right Arm)   Pulse 83   Temp 98.5 F (36.9 C) (Oral)   Resp 17   LMP 01/13/2019 (Approximate)   SpO2 98%   Visual Acuity Right Eye Distance:   Left Eye Distance:   Bilateral Distance:    Right Eye Near:   Left Eye Near:    Bilateral Near:     Physical Exam Vitals signs and nursing note reviewed.  Constitutional:      General: She is not in acute distress.    Appearance: She is well-developed.  HENT:     Head: Normocephalic and atraumatic.      Ears:     Comments: Bilateral ears without tenderness to palpation of external auricle, tragus and mastoid, EAC's without erythema or swelling, TM's with good bony  landmarks and cone of light. Non erythematous.  Eyes:     Extraocular Movements: Extraocular movements intact.     Conjunctiva/sclera: Conjunctivae normal.     Pupils: Pupils are equal, round, and reactive to light.     Comments: Wearing glasses  Neck:     Musculoskeletal: Neck supple.  Cardiovascular:     Rate and Rhythm: Normal rate and regular rhythm.     Heart sounds: No murmur.  Pulmonary:     Effort: Pulmonary effort is normal. No respiratory distress.     Breath sounds: Normal breath sounds.     Comments: Breathing comfortably at rest, CTABL, no wheezing, rales or other adventitious sounds auscultated  Nonreproducible with palpation of anterior chest Abdominal:     Palpations: Abdomen is soft.     Tenderness: There is no abdominal tenderness.  Skin:    General: Skin is warm and dry.  Neurological:     Mental Status: She is alert.      UC Treatments / Results  Labs (all labs ordered are listed, but only abnormal results are displayed) Labs Reviewed - No data to display  EKG   Radiology No results found.  Procedures Procedures (including critical care time)  Medications Ordered in UC Medications - No data to display  Initial Impression / Assessment and Plan / UC Course  I have reviewed the triage vital signs and the nursing notes.  Pertinent labs & imaging results that were available during my care of the patient were reviewed by me and considered in my medical decision making (see chart for details).     EKG normal sinus rhythm, continues to have nonspecific T wave inversions in lead III, aVF and V3 through V6.  Stable from previous EKGs early in July. Recommending follow-up with cardiology and PCP as planned.  Will refill hydroxyzine.  Symptoms stable from previous evaluation, do not suspect  ACS at this time.  PERC negative , nexplanon progestin only.  Continue to monitor symptoms,Discussed strict return precautions. Patient verbalized understanding and is agreeable with plan.   Final Clinical Impressions(s) / UC Diagnoses   Final diagnoses:  Atypical chest pain  Medication refill     Discharge Instructions     EKG stable from previous EKG  Follow up with cardiology as planned on 8/10 Follow up appointment with Primary care on 7/24- discuss need for permanent restrictions with PCP  Continue to use hydroxizine to see if symptoms diminish with use of this    ED Prescriptions    Medication Sig Dispense Auth. Provider   hydrOXYzine (ATARAX/VISTARIL) 25 MG tablet Take 1 tablet (25 mg total) by mouth every 6 (six) hours. 24 tablet , Sullivan C, PA-C     Controlled Substance Prescriptions [place] Controlled Substance Registry consulted? Not Applicable   Janith Lima, [place] 02/10/19 1140

## 2019-02-10 NOTE — Discharge Instructions (Signed)
EKG stable from previous EKG  Follow up with cardiology as planned on 8/10 Follow up appointment with Primary care on 7/24- discuss need for permanent restrictions with PCP  Continue to use hydroxizine to see if symptoms diminish with use of this

## 2019-02-10 NOTE — ED Triage Notes (Signed)
Patient presents to Urgent Care with complaints of intermittent generalized chest pain since over a month ago. Patient reports she has been taking hydroxyzine, has appt on Aug 10 with cardiology.

## 2019-02-13 ENCOUNTER — Other Ambulatory Visit: Payer: Self-pay

## 2019-02-13 ENCOUNTER — Ambulatory Visit: Payer: Medicaid Other | Attending: Family Medicine | Admitting: Family Medicine

## 2019-02-13 ENCOUNTER — Encounter: Payer: Self-pay | Admitting: Family Medicine

## 2019-02-13 VITALS — BP 117/78 | HR 110 | Temp 98.8°F | Ht 64.0 in | Wt 268.6 lb

## 2019-02-13 DIAGNOSIS — R0789 Other chest pain: Secondary | ICD-10-CM

## 2019-02-13 DIAGNOSIS — R9431 Abnormal electrocardiogram [ECG] [EKG]: Secondary | ICD-10-CM

## 2019-02-13 DIAGNOSIS — Z09 Encounter for follow-up examination after completed treatment for conditions other than malignant neoplasm: Secondary | ICD-10-CM | POA: Diagnosis not present

## 2019-02-13 DIAGNOSIS — R1011 Right upper quadrant pain: Secondary | ICD-10-CM

## 2019-02-13 DIAGNOSIS — R000 Tachycardia, unspecified: Secondary | ICD-10-CM

## 2019-02-13 DIAGNOSIS — D649 Anemia, unspecified: Secondary | ICD-10-CM | POA: Insufficient documentation

## 2019-02-13 DIAGNOSIS — Z79899 Other long term (current) drug therapy: Secondary | ICD-10-CM | POA: Insufficient documentation

## 2019-02-13 DIAGNOSIS — Z8249 Family history of ischemic heart disease and other diseases of the circulatory system: Secondary | ICD-10-CM | POA: Insufficient documentation

## 2019-02-13 DIAGNOSIS — F411 Generalized anxiety disorder: Secondary | ICD-10-CM | POA: Diagnosis not present

## 2019-02-13 DIAGNOSIS — F418 Other specified anxiety disorders: Secondary | ICD-10-CM | POA: Diagnosis not present

## 2019-02-13 DIAGNOSIS — K219 Gastro-esophageal reflux disease without esophagitis: Secondary | ICD-10-CM

## 2019-02-13 MED ORDER — PANTOPRAZOLE SODIUM 40 MG PO TBEC: 40.0000 mg | DELAYED_RELEASE_TABLET | Freq: Every day | ORAL | 3 refills | Status: DC

## 2019-02-13 MED ORDER — SERTRALINE HCL 100 MG PO TABS: ORAL_TABLET | ORAL | 4 refills | Status: DC

## 2019-02-13 NOTE — Patient Instructions (Signed)
Gastroesophageal Reflux Disease, Adult Gastroesophageal reflux (GER) happens when acid from the stomach flows up into the tube that connects the mouth and the stomach (esophagus). Normally, food travels down the esophagus and stays in the stomach to be digested. With GER, food and stomach acid sometimes move back up into the esophagus. You may have a disease called gastroesophageal reflux disease (GERD) if the reflux:  Happens often.  Causes frequent or very bad symptoms.  Causes problems such as damage to the esophagus. When this happens, the esophagus becomes sore and swollen (inflamed). Over time, GERD can make small holes (ulcers) in the lining of the esophagus. What are the causes? This condition is caused by a problem with the muscle between the esophagus and the stomach. When this muscle is weak or not normal, it does not close properly to keep food and acid from coming back up from the stomach. The muscle can be weak because of:  Tobacco use.  Pregnancy.  Having a certain type of hernia (hiatal hernia).  Alcohol use.  Certain foods and drinks, such as coffee, chocolate, onions, and peppermint. What increases the risk? You are more likely to develop this condition if you:  Are overweight.  Have a disease that affects your connective tissue.  Use NSAID medicines. What are the signs or symptoms? Symptoms of this condition include:  Heartburn.  Difficult or painful swallowing.  The feeling of having a lump in the throat.  A bitter taste in the mouth.  Bad breath.  Having a lot of saliva.  Having an upset or bloated stomach.  Belching.  Chest pain. Different conditions can cause chest pain. Make sure you see your doctor if you have chest pain.  Shortness of breath or noisy breathing (wheezing).  Ongoing (chronic) cough or a cough at night.  Wearing away of the surface of teeth (tooth enamel).  Weight loss. How is this treated? Treatment will depend on how  bad your symptoms are. Your doctor may suggest:  Changes to your diet.  Medicine.  Surgery. Follow these instructions at home: Eating and drinking   Follow a diet as told by your doctor. You may need to avoid foods and drinks such as: ? Coffee and tea (with or without caffeine). ? Drinks that contain alcohol. ? Energy drinks and sports drinks. ? Bubbly (carbonated) drinks or sodas. ? Chocolate and cocoa. ? Peppermint and mint flavorings. ? Garlic and onions. ? Horseradish. ? Spicy and acidic foods. These include peppers, chili powder, curry powder, vinegar, hot sauces, and BBQ sauce. ? Citrus fruit juices and citrus fruits, such as oranges, lemons, and limes. ? Tomato-based foods. These include red sauce, chili, salsa, and pizza with red sauce. ? Fried and fatty foods. These include donuts, french fries, potato chips, and high-fat dressings. ? High-fat meats. These include hot dogs, rib eye steak, sausage, ham, and bacon. ? High-fat dairy items, such as whole milk, butter, and cream cheese.  Eat small meals often. Avoid eating large meals.  Avoid drinking large amounts of liquid with your meals.  Avoid eating meals during the 2-3 hours before bedtime.  Avoid lying down right after you eat.  Do not exercise right after you eat. Lifestyle   Do not use any products that contain nicotine or tobacco. These include cigarettes, e-cigarettes, and chewing tobacco. If you need help quitting, ask your doctor.  Try to lower your stress. If you need help doing this, ask your doctor.  If you are overweight, lose an amount   of weight that is healthy for you. Ask your doctor about a safe weight loss goal. General instructions  Pay attention to any changes in your symptoms.  Take over-the-counter and prescription medicines only as told by your doctor. Do not take aspirin, ibuprofen, or other NSAIDs unless your doctor says it is okay.  Wear loose clothes. Do not wear anything tight  around your waist.  Raise (elevate) the head of your bed about 6 inches (15 cm).  Avoid bending over if this makes your symptoms worse.  Keep all follow-up visits as told by your doctor. This is important. Contact a doctor if:  You have new symptoms.  You lose weight and you do not know why.  You have trouble swallowing or it hurts to swallow.  You have wheezing or a cough that keeps happening.  Your symptoms do not get better with treatment.  You have a hoarse voice. Get help right away if:  You have pain in your arms, neck, jaw, teeth, or back.  You feel sweaty, dizzy, or light-headed.  You have chest pain or shortness of breath.  You throw up (vomit) and your throw-up looks like blood or coffee grounds.  You pass out (faint).  Your poop (stool) is bloody or black.  You cannot swallow, drink, or eat. Summary  If a person has gastroesophageal reflux disease (GERD), food and stomach acid move back up into the esophagus and cause symptoms or problems such as damage to the esophagus.  Treatment will depend on how bad your symptoms are.  Follow a diet as told by your doctor.  Take all medicines only as told by your doctor. This information is not intended to replace advice given to you by your health care provider. Make sure you discuss any questions you have with your health care provider. Document Released: 12/26/2007 Document Revised: 01/15/2018 Document Reviewed: 01/15/2018 Elsevier Patient Education  [employer].  Generalized Anxiety Disorder, Adult Generalized anxiety disorder (GAD) is a mental health disorder. People with this condition constantly worry about everyday events. Unlike normal anxiety, worry related to GAD is not triggered by a specific event. These worries also do not fade or get better with time. GAD interferes with life functions, including relationships, work, and school. GAD can vary from mild to severe. People with severe GAD can have  intense waves of anxiety with physical symptoms (panic attacks). What are the causes? The exact cause of GAD is not known. What increases the risk? This condition is more likely to develop in:  Women.  People who have a family history of anxiety disorders.  People who are very shy.  People who experience very stressful life events, such as the death of a loved one.  People who have a very stressful family environment. What are the signs or symptoms? People with GAD often worry excessively about many things in their lives, such as their health and family. They may also be overly concerned about:  Doing well at work.  Being on time.  Natural disasters.  Friendships. Physical symptoms of GAD include:  Fatigue.  Muscle tension or having muscle twitches.  Trembling or feeling shaky.  Being easily startled.  Feeling like your heart is pounding or racing.  Feeling out of breath or like you cannot take a deep breath.  Having trouble falling asleep or staying asleep.  Sweating.  Nausea, diarrhea, or irritable bowel syndrome (IBS).  Headaches.  Trouble concentrating or remembering facts.  Restlessness.  Irritability. How is this  diagnosed? Your health care provider can diagnose GAD based on your symptoms and medical history. You will also have a physical exam. The health care provider will ask specific questions about your symptoms, including how severe they are, when they started, and if they come and go. Your health care provider may ask you about your use of alcohol or drugs, including prescription medicines. Your health care provider may refer you to a mental health specialist for further evaluation. Your health care provider will do a thorough examination and may perform additional tests to rule out other possible causes of your symptoms. To be diagnosed with GAD, a person must have anxiety that:  Is out of his or her control.  Affects several different aspects  of his or her life, such as work and relationships.  Causes distress that makes him or her unable to take part in normal activities.  Includes at least three physical symptoms of GAD, such as restlessness, fatigue, trouble concentrating, irritability, muscle tension, or sleep problems. Before your health care provider can confirm a diagnosis of GAD, these symptoms must be present more days than they are not, and they must last for six months or longer. How is this treated? The following therapies are usually used to treat GAD:  Medicine. Antidepressant medicine is usually prescribed for long-term daily control. Antianxiety medicines may be added in severe cases, especially when panic attacks occur.  Talk therapy (psychotherapy). Certain types of talk therapy can be helpful in treating GAD by providing support, education, and guidance. Options include: ? Cognitive behavioral therapy (CBT). People learn coping skills and techniques to ease their anxiety. They learn to identify unrealistic or negative thoughts and behaviors and to replace them with positive ones. ? Acceptance and commitment therapy (ACT). This treatment teaches people how to be mindful as a way to cope with unwanted thoughts and feelings. ? Biofeedback. This process trains you to manage your body's response (physiological response) through breathing techniques and relaxation methods. You will work with a therapist while machines are used to monitor your physical symptoms.  Stress management techniques. These include yoga, meditation, and exercise. A mental health specialist can help determine which treatment is best for you. Some people see improvement with one type of therapy. However, other people require a combination of therapies. Follow these instructions at home:  Take over-the-counter and prescription medicines only as told by your health care provider.  Try to maintain a normal routine.  Try to anticipate stressful  situations and allow extra time to manage them.  Practice any stress management or self-calming techniques as taught by your health care provider.  Do not punish yourself for setbacks or for not making progress.  Try to recognize your accomplishments, even if they are small.  Keep all follow-up visits as told by your health care provider. This is important. Contact a health care provider if:  Your symptoms do not get better.  Your symptoms get worse.  You have signs of depression, such as: ? A persistently sad, cranky, or irritable mood. ? Loss of enjoyment in activities that used to bring you joy. ? Change in weight or eating. ? Changes in sleeping habits. ? Avoiding friends or family members. ? Loss of energy for normal tasks. ? Feelings of guilt or worthlessness. Get help right away if:  You have serious thoughts about hurting yourself or others. If you ever feel like you may hurt yourself or others, or have thoughts about taking your own life, get help right  away. You can go to your nearest emergency department or call:  Your local emergency services (911 in the [place]  A suicide crisis helpline, such as the [facility] at (513) 706-7029. This is open 24 hours a day. Summary  Generalized anxiety disorder (GAD) is a mental health disorder that involves worry that is not triggered by a specific event.  People with GAD often worry excessively about many things in their lives, such as their health and family.  GAD may cause physical symptoms such as restlessness, trouble concentrating, sleep problems, frequent sweating, nausea, diarrhea, headaches, and trembling or muscle twitching.  A mental health specialist can help determine which treatment is best for you. Some people see improvement with one type of therapy. However, other people require a combination of therapies. This information is not intended to replace advice given to you by your health  care provider. Make sure you discuss any questions you have with your health care provider. Document Released: 11/03/2012 Document Revised: 06/21/2017 Document Reviewed: 05/29/2016 Elsevier Patient Education  2020 [employer].

## 2019-02-16 LAB — T4 AND TSH
T4, Total: 7.1 ug/dL (ref 4.5–12.0)
TSH: 2.27 u[IU]/mL (ref 0.450–4.500)

## 2019-02-16 LAB — H. PYLORI ANTIBODY, IGG: H. pylori, IgG AbS: 0.2 {index_val} (ref 0.00–0.79)

## 2019-02-18 DIAGNOSIS — R05 Cough: Secondary | ICD-10-CM | POA: Diagnosis not present

## 2019-02-18 DIAGNOSIS — R0600 Dyspnea, unspecified: Secondary | ICD-10-CM | POA: Diagnosis not present

## 2019-02-18 DIAGNOSIS — Z1159 Encounter for screening for other viral diseases: Secondary | ICD-10-CM | POA: Diagnosis not present

## 2019-02-19 ENCOUNTER — Ambulatory Visit (HOSPITAL_COMMUNITY): Payer: Medicaid Other

## 2019-02-23 NOTE — Progress Notes (Signed)
Established Patient Office Visit  Subjective:  Patient ID: Breanna Thomas, female    DOB: 24-Jun-1981  Age: 38 y.o. MRN: [id number]  CC:  Chief Complaint  Patient presents with  . Hospitalization Follow-up    HPI Breanna Thomas, a patient at [facility] who presents in follow-up of recent emergency department visit on 01/23/2019, 01/26/2019 and again on 02/10/2019 due to recurrent chest pain which had occurred for approximately a month prior to her initial emergency department visit.  Patient states that the chest discomfort can occur on either side of her chest and is a sharp, pinching sensation which is very brief lasting only a few seconds.  She denies any issues with shortness of breath or cough.  She has had no fever or chills.  No known exposures to anyone with COVID-19.  She does have a history of GERD but her current episodes of chest pain/chest discomfort have not occurred in the past with GERD.  Patient also with history of anemia and has not had similar symptoms with the past issues with anemia.  She states that she was told that she needed to follow-up with her doctor regarding her recent ED visit with chest pain.  She reports that she was prescribed hydroxyzine as well as naproxen after her 01/26/2019 ED visit and she felt that the hydroxyzine did decrease her symptoms.  She was having some upper back and right arm discomfort at her 01/26/2019 ED visit but this has resolved after the use of naproxen.  She returned to the ED on 02/11/2019 after running out of the hydroxyzine and having more frequent onset of chest pain after running out of the medication.      Patient has had some issues with acid reflux and she is not sure that her omeprazole is effective.  She has had some occasional burping/belching as well as occasional backwash of bad tasting fluid into her throat.  She does think that this occurs most often if she is eaten late at night and then lies down or if she  has had spicy/greasy foods.  Patient was prescribed naproxen to take for her chest pain after her ED visit in early July.  She denies any vomiting, no blood in the stool, no diarrhea and no black stools.       She is not sure why her heart rate is elevated at today's visit.  She denies any dizziness or presyncopal sensation.  She does not have the sensation of palpitations or increased heart rate.  She does have history of anxiety.  She reports that she will need a refill of her Zoloft and she feels that this medication does help her to worry less/be less anxious.  The hydroxyzine that she was prescribed at her ED visit has also helped.  She reports that she does have cardiology appointment early next month.  She has had past issues with vertigo but this has not occurred recently.     Past Medical History:  Diagnosis Date  . Anemia   . Anxiety   . Anxiety   . Panic attacks   . Vertigo     Past Surgical History:  Procedure Laterality Date  . NO PAST SURGERIES      Family History  Problem Relation Age of Onset  . Diabetes Mother   . Hypertension Mother   . Heart disease Father   . Seizures Father   . Seizures Other   . Heart failure Other   .  Diabetes Sister     Social History   Socioeconomic History  . Marital status: Single    Spouse name: Not on file  . Number of children: 3  . Years of education: College  . Highest education level: Not on file  Occupational History    Employer: [employer]    Comment: [employer]  . Financial resource strain: Not on file  . Food insecurity    Worry: Not on file    Inability: Not on file  . Transportation needs    Medical: Not on file    Non-medical: Not on file  Tobacco Use  . Smoking status: Never Smoker  . Smokeless tobacco: Never Used  Substance and Sexual Activity  . Alcohol use: Not Currently    Alcohol/week: 1.0 standard drinks    Types: 1 Glasses of wine per week    Comment: social  . Drug use: No   . Sexual activity: Not Currently    Birth control/protection: Implant  Lifestyle  . Physical activity    Days per week: Not on file    Minutes per session: Not on file  . Stress: Not on file  Relationships  . Social Herbalist on phone: Not on file    Gets together: Not on file    Attends religious service: Not on file    Active member of club or organization: Not on file    Attends meetings of clubs or organizations: Not on file    Relationship status: Not on file  . Intimate partner violence    Fear of current or ex partner: Not on file    Emotionally abused: Not on file    Physically abused: Not on file    Forced sexual activity: Not on file  Other Topics Concern  . Not on file  Social History Narrative   Patient lives at home alone.   Caffeine Use: none in the past 2 weeks, soda occasionally    Outpatient Medications Prior to Visit  Medication Sig Dispense Refill  . acetaminophen (TYLENOL) 500 MG tablet Take 1,000 mg by mouth every 6 (six) hours as needed for moderate pain.    Marland Kitchen albuterol (PROVENTIL HFA;VENTOLIN HFA) 108 (90 Base) MCG/ACT inhaler Inhale 2 puffs into the lungs every 6 (six) hours as needed for wheezing or shortness of breath. 1 Inhaler 2  . etonogestrel (IMPLANON) 68 MG IMPL implant Inject 1 each into the skin once.    . hydrOXYzine (ATARAX/VISTARIL) 25 MG tablet Take 1 tablet (25 mg total) by mouth every 6 (six) hours. 24 tablet 0  . Multiple Vitamin (MULTIVITAMIN) tablet Take 1 tablet by mouth daily.    . Multiple Vitamins-Minerals (AIRBORNE GUMMIES PO) Take 3 tablets by mouth daily.    . naproxen (NAPROSYN) 500 MG tablet Take 1 tablet (500 mg total) by mouth 2 (two) times daily. 10 tablet 0  . omeprazole (PRILOSEC) 20 MG capsule Take 1 capsule (20 mg total) by mouth daily. 14 capsule 0  . pimecrolimus (ELIDEL) 1 % cream Apply 1 application topically 2 (two) times daily.     . sertraline (ZOLOFT) 100 MG tablet TAKE ONE AND A HALF TABLETS (150 MG  TOTAL)  BY MOUTH DAILY. (Patient taking differently: Take 150 mg by mouth daily. ) 45 tablet 6   No facility-administered medications prior to visit.     No Known Allergies  ROS Review of Systems  Constitutional: Positive for fatigue (mild). Negative for chills and  fever.  HENT: Negative for sore throat and trouble swallowing.   Eyes: Negative for photophobia and visual disturbance.  Respiratory: Negative for cough and shortness of breath.   Cardiovascular: Positive for chest pain and leg swelling (occasional puffiness at ankles). Negative for palpitations.  Gastrointestinal: Positive for nausea (mild, occasional). Negative for abdominal pain, constipation, diarrhea and vomiting.  Endocrine: Negative for cold intolerance, heat intolerance, polydipsia, polyphagia and polyuria.  Genitourinary: Negative for dysuria and frequency.  Musculoskeletal: Negative for arthralgias and back pain.  Neurological: Negative for dizziness and headaches.  Hematological: Negative for adenopathy. Does not bruise/bleed easily.  Psychiatric/Behavioral: Negative for self-injury and suicidal ideas. The patient is nervous/anxious (improved with medication).       Objective:    Physical Exam  Constitutional: She is oriented to person, place, and time. She appears well-developed and well-nourished.  Well-nourished well-developed obese female in no acute distress.  Patient is wearing glasses.  Neck: Normal range of motion. Neck supple. No JVD present. No thyromegaly present.  Cardiovascular: Normal rate, regular rhythm and normal heart sounds.  No carotid bruit  Pulmonary/Chest: Effort normal and breath sounds normal.  Abdominal: Soft. Bowel sounds are normal. There is abdominal tenderness (Right upper quadrant tenderness to palpation, especially on the underside of the liver/area of gallbladder). There is no rebound and no guarding.  Musculoskeletal:        General: No tenderness or edema (Patient does not  appear to have actual edema but rather some fatty tissue deposits at the ankles).     Comments: No CVA tenderness  Lymphadenopathy:    She has no cervical adenopathy.  Neurological: She is alert and oriented to person, place, and time.  Skin: Skin is warm and dry.  Psychiatric: She has a normal mood and affect. Her behavior is normal.  Nursing note and vitals reviewed.   BP 117/78   Pulse (!) 110   Temp 98.8 F (37.1 C) (Oral)   Ht 5\' 4"  (1.626 m)   Wt 268 lb 9.6 oz (121.8 kg)   SpO2 97%   BMI 46.11 kg/m  Wt Readings from Last 3 Encounters:  02/13/19 268 lb 9.6 oz (121.8 kg)  01/26/19 246 lb (111.6 kg)  01/23/19 266 lb (120.7 kg)     Health Maintenance Due  Topic Date Due  . PAP SMEAR-Modifier  08/03/2018  . INFLUENZA VACCINE  02/21/2019    There are no preventive care reminders to display for this patient.  Lab Results  Component Value Date   TSH 2.270 02/13/2019   Lab Results  Component Value Date   WBC 6.5 01/26/2019   HGB 12.4 01/26/2019   HCT 40.8 01/26/2019   MCV 92.5 01/26/2019   PLT 308 01/26/2019   Lab Results  Component Value Date   NA 137 01/26/2019   K 4.0 01/26/2019   CO2 22 01/26/2019   GLUCOSE 95 01/26/2019   BUN 8 01/26/2019   CREATININE 0.70 01/26/2019   BILITOT 0.4 12/15/2013   ALKPHOS 42 12/15/2013   AST 20 12/15/2013   ALT 11 12/15/2013   PROT 6.6 12/15/2013   ALBUMIN 3.9 12/15/2013   CALCIUM 8.9 01/26/2019   ANIONGAP 7 01/26/2019   No results found for: CHOL No results found for: HDL No results found for: LDLCALC No results found for: TRIG No results found for: CHOLHDL No results found for: HGBA1C    Assessment & Plan:   1. Chest pain, atypical; 7.  Abnormal EKG; 8.  Encounter for examination following treatment  at hospital Patient's emergency department notes from her visits on 01/23/2019, 01/26/2019 and most recent visit on 02/10/2019 reviewed.  Patient with atypical chest pain that may be related to anxiety as she did have  some improvement in chest pain with the use of hydroxyzine.  She also reports history of acid reflux but does not feel that her omeprazole is effective.  She will be prescribed pantoprazole to try instead and will also have testing for H. pylori antibodies due to her atypical chest pain.  Patient also had an abnormal EKG on more than one occasion of the emergency department.  At her 01/23/2019 appointment, cardiology was consulted and it was felt that patient should follow-up with cardiology for echo due to EKG showing T wave inversions in the lateral and inferior leads.  Cardiology thought that abnormal EKG might be related to LVH.  Patient had normal troponin and d-dimer at her ED visit on 01/23/2019.  She has upcoming cardiology appointment on 03/02/2019.  Patient also had some reproducibility of chest and shoulder discomfort during her 01/26/2019 visit for which she was prescribed naproxen and she does report improvement in her right arm and upper back discomfort after naproxen use.  She however also had right upper quadrant tenderness at today's visit which can be associated with gallbladder disease which can also cause referred pain to the right upper back and patient will be scheduled for right upper quadrant ultrasound. - H. pylori antibody, IgG - pantoprazole (PROTONIX) 40 MG tablet; Take 1 tablet (40 mg total) by mouth daily. To reduce stomach acid  Dispense: 30 tablet; Refill: 3  2. Gastroesophageal reflux disease, esophagitis presence not specified Patient with history of GERD and she does not feel that the current omeprazole is helpful.  Patient can discontinue use of omeprazole and new prescription provided for pantoprazole 40 mg daily.  Patient is also encouraged to avoid known trigger foods as well as avoidance of eating within 2 hours of bedtime.  She will also have H. pylori antibody due to atypical chest pain in addition to worsening of her reflux symptoms and occasional nausea.  Patient  additionally will have right upper quadrant ultrasound to look for gallbladder disease. - H. pylori antibody, IgG - pantoprazole (PROTONIX) 40 MG tablet; Take 1 tablet (40 mg total) by mouth daily. To reduce stomach acid  Dispense: 30 tablet; Refill: 3  3. Right upper quadrant pain Patient with right upper quadrant pain on examination and she has been having issues with increased reflux symptoms as well as atypical chest pain.  Patient will be scheduled for right upper quadrant ultrasound. - US Abdomen Limited RUQ; Future  4. GAD (generalized anxiety disorder; depression with anxiety New prescription provided for sertraline for continued treatment of generalized anxiety disorder as well as depression with anxiety.  Patient was also asked to consider counseling -Sertraline (ZOLOFT) 100 mg tablet: Take one pill once per day Dispense: 30 tablet; Refill 4  5. Tachycardia Patient with tachycardia.  Will check T4 and TSH.  Patient has had recent CBC which did not show any significant anemia as her hemoglobin was 11.9 at her initial ED visit on 01/23/2019 and repeat CBC on 01/26/2019 with normal hemoglobin of 12.4 and normal MCV of 92.5.  Basic metabolic panel done on 09/27/8586 did not show any electrolyte abnormalities.  Patient with normal potassium of 4.0 glucose random of 95 and creatinine of 0.70.  Patient's tachycardia may be anxiety related but she will be notified if there are any abnormalities  in today's labs suggestive of the need for further evaluation.  Patient also has upcoming cardiology appointment on 03/02/2019. - T4 AND TSH   Meds ordered this encounter  Medications  . sertraline (ZOLOFT) 100 MG tablet    Sig: Take one pill once per day    Dispense:  30 tablet    Refill:  4  . pantoprazole (PROTONIX) 40 MG tablet    Sig: Take 1 tablet (40 mg total) by mouth daily. To reduce stomach acid    Dispense:  30 tablet    Refill:  3    Follow-up: Return in about 4 weeks (around 03/13/2019) for  chest pain/GERD/RUQ pain.    Antony Blackbird, MD

## 2019-02-24 ENCOUNTER — Other Ambulatory Visit: Payer: Self-pay

## 2019-02-24 ENCOUNTER — Ambulatory Visit (HOSPITAL_COMMUNITY)
Admission: RE | Admit: 2019-02-24 | Discharge: 2019-02-24 | Disposition: A | Discharge status: Home / Self Care | Payer: Medicaid Other | Source: Ambulatory Visit | Attending: Family Medicine | Admitting: Family Medicine

## 2019-02-24 DIAGNOSIS — R1011 Right upper quadrant pain: Secondary | ICD-10-CM | POA: Insufficient documentation

## 2019-02-26 ENCOUNTER — Other Ambulatory Visit: Payer: Self-pay

## 2019-02-26 ENCOUNTER — Encounter: Payer: Self-pay | Admitting: Cardiology

## 2019-02-26 ENCOUNTER — Ambulatory Visit (INDEPENDENT_AMBULATORY_CARE_PROVIDER_SITE_OTHER): Payer: Medicaid Other | Admitting: Cardiology

## 2019-02-26 VITALS — BP 122/82 | HR 84 | Ht 64.0 in | Wt 271.0 lb

## 2019-02-26 DIAGNOSIS — R079 Chest pain, unspecified: Secondary | ICD-10-CM | POA: Diagnosis not present

## 2019-02-26 DIAGNOSIS — R011 Cardiac murmur, unspecified: Secondary | ICD-10-CM

## 2019-02-26 DIAGNOSIS — R9431 Abnormal electrocardiogram [ECG] [EKG]: Secondary | ICD-10-CM

## 2019-02-26 DIAGNOSIS — R0789 Other chest pain: Secondary | ICD-10-CM | POA: Diagnosis not present

## 2019-02-26 NOTE — Patient Instructions (Signed)
Medication Instructions:  Your physician recommends that you continue on your current medications as directed. Please refer to the Current Medication list given to you today.  If you need a refill on your cardiac medications before your next appointment, please call your pharmacy.   Lab work: NONE If you have labs (blood work) drawn today and your tests are completely normal, you will receive your results only by: . MyChart Message (if you have MyChart) OR . A paper copy in the mail If you have any lab test that is abnormal or we need to change your treatment, we will call you to review the results.  Testing/Procedures: You had an EKG performed today.  Your physician has requested that you have an echocardiogram. Echocardiography is a painless test that uses sound waves to create images of your heart. It provides your doctor with information about the size and shape of your heart and how well your heart's chambers and valves are working. This procedure takes approximately one hour. There are no restrictions for this procedure.  Your physician has requested that you have a lexiscan myoview. For further information please visit [email or web address]. Please follow instruction sheet, as given.    Follow-Up: At [facility], you and your health needs are our priority.  As part of our continuing mission to provide you with exceptional heart care, we have created designated Provider Care Teams.  These Care Teams include your primary Cardiologist (physician) and Advanced Practice Providers (APPs -  Physician Assistants and Nurse Practitioners) who all work together to provide you with the care you need, when you need it. You will need a follow up appointment in 4 months.   Any Other Special Instructions Will Be Listed Below  Regadenoson injection What is this medicine? REGADENOSON is used to test the heart for coronary artery disease. It is used in patients who can not exercise for their stress  test. This medicine may be used for other purposes; ask your health care provider or pharmacist if you have questions. COMMON BRAND NAME(S): Lexiscan What should I tell my health care provider before I take this medicine? They need to know if you have any of these conditions:  heart problems  lung or breathing disease, like asthma or COPD  an unusual or allergic reaction to regadenoson, other medicines, foods, dyes, or preservatives  pregnant or trying to get pregnant  breast-feeding How should I use this medicine? This medicine is for injection into a vein. It is given by a health care professional in a hospital or clinic setting. Talk to your pediatrician regarding the use of this medicine in children. Special care may be needed. Overdosage: If you think you have taken too much of this medicine contact a poison control center or emergency room at once. NOTE: This medicine is only for you. Do not share this medicine with others. What if I miss a dose? This does not apply. What may interact with this medicine?  caffeine  dipyridamole  guarana  theophylline This list may not describe all possible interactions. Give your health care provider a list of all the medicines, herbs, non-prescription drugs, or dietary supplements you use. Also tell them if you smoke, drink alcohol, or use illegal drugs. Some items may interact with your medicine. What should I watch for while using this medicine? Your condition will be monitored carefully while you are receiving this medicine. Do not take medicines, foods, or drinks with caffeine (like coffee, tea, or colas) for at least 12 hours   before your test. If you do not know if something contains caffeine, ask your health care professional. What side effects may I notice from receiving this medicine? Side effects that you should report to your doctor or health care professional as soon as possible:  allergic reactions like skin rash, itching or  hives, swelling of the face, lips, or tongue  breathing problems  chest pain, tightness or palpitations  severe headache Side effects that usually do not require medical attention (report to your doctor or health care professional if they continue or are bothersome):  flushing  headache  irritation or pain at site where injected  nausea, vomiting This list may not describe all possible side effects. Call your doctor for medical advice about side effects. You may report side effects to FDA at 1-800-FDA-1088. Where should I keep my medicine? This drug is given in a hospital or clinic and will not be stored at home. NOTE: This sheet is a summary. It may not cover all possible information. If you have questions about this medicine, talk to your doctor, pharmacist, or health care provider.  2020 Elsevier/Gold Standard (2008-03-08 15:08:13)  Cardiac Nuclear Scan A cardiac nuclear scan is a test that is done to check the flow of blood to your heart. It is done when you are resting and when you are exercising. The test looks for problems such as:  Not enough blood reaching a portion of the heart.  The heart muscle not working as it should. You may need this test if:  You have heart disease.  You have had lab results that are not normal.  You have had heart surgery or a balloon procedure to open up blocked arteries (angioplasty).  You have chest pain.  You have shortness of breath. In this test, a special dye (tracer) is put into your bloodstream. The tracer will travel to your heart. A camera will then take pictures of your heart to see how the tracer moves through your heart. This test is usually done at a hospital and takes 2-4 hours. Tell a doctor about:  Any allergies you have.  All medicines you are taking, including vitamins, herbs, eye drops, creams, and over-the-counter medicines.  Any problems you or family members have had with anesthetic medicines.  Any blood  disorders you have.  Any surgeries you have had.  Any medical conditions you have.  Whether you are pregnant or may be pregnant. What are the risks? Generally, this is a safe test. However, problems may occur, such as:  Serious chest pain and heart attack. This is only a risk if the stress portion of the test is done.  Rapid heartbeat.  A feeling of warmth in your chest. This feeling usually does not last long.  Allergic reaction to the tracer. What happens before the test?  Ask your doctor about changing or stopping your normal medicines. This is important.  Follow instructions from your doctor about what you cannot eat or drink.  Remove your jewelry on the day of the test. What happens during the test?  An IV tube will be inserted into one of your veins.  Your doctor will give you a small amount of tracer through the IV tube.  You will wait for 20-40 minutes while the tracer moves through your bloodstream.  Your heart will be monitored with an electrocardiogram (ECG).  You will lie down on an exam table.  Pictures of your heart will be taken for about 15-20 minutes.  You may   also have a stress test. For this test, one of these things may be done: ? You will be asked to exercise on a treadmill or a stationary bike. ? You will be given medicines that will make your heart work harder. This is done if you are unable to exercise.  When blood flow to your heart has peaked, a tracer will again be given through the IV tube.  After 20-40 minutes, you will get back on the exam table. More pictures will be taken of your heart.  Depending on the tracer that is used, more pictures may need to be taken 3-4 hours later.  Your IV tube will be removed when the test is over. The test may vary among doctors and hospitals. What happens after the test?  Ask your doctor: ? Whether you can return to your normal schedule, including diet, activities, and medicines. ? Whether you should  drink more fluids. This will help to remove the tracer from your body. Drink enough fluid to keep your pee (urine) pale yellow.  Ask your doctor, or the department that is doing the test: ? When will my results be ready? ? How will I get my results? Summary  A cardiac nuclear scan is a test that is done to check the flow of blood to your heart.  Tell your doctor whether you are pregnant or may be pregnant.  Before the test, ask your doctor about changing or stopping your normal medicines. This is important.  Ask your doctor whether you can return to your normal activities. You may be asked to drink more fluids. This information is not intended to replace advice given to you by your health care provider. Make sure you discuss any questions you have with your health care provider. Document Released: 12/23/2017 Document Revised: 10/29/2018 Document Reviewed: 12/23/2017 Elsevier Patient Education  2020 Elsevier Inc.  Echocardiogram An echocardiogram is a procedure that uses painless sound waves (ultrasound) to produce an image of the heart. Images from an echocardiogram can provide important information about:  Signs of coronary artery disease (CAD).  Aneurysm detection. An aneurysm is a weak or damaged part of an artery wall that bulges out from the normal force of blood pumping through the body.  Heart size and shape. Changes in the size or shape of the heart can be associated with certain conditions, including heart failure, aneurysm, and CAD.  Heart muscle function.  Heart valve function.  Signs of a past heart attack.  Fluid buildup around the heart.  Thickening of the heart muscle.  A tumor or infectious growth around the heart valves. Tell a health care provider about:  Any allergies you have.  All medicines you are taking, including vitamins, herbs, eye drops, creams, and over-the-counter medicines.  Any blood disorders you have.  Any surgeries you have had.  Any  medical conditions you have.  Whether you are pregnant or may be pregnant. What are the risks? Generally, this is a safe procedure. However, problems may occur, including:  Allergic reaction to dye (contrast) that may be used during the procedure. What happens before the procedure? No specific preparation is needed. You may eat and drink normally. What happens during the procedure?   An IV tube may be inserted into one of your veins.  You may receive contrast through this tube. A contrast is an injection that improves the quality of the pictures from your heart.  A gel will be applied to your chest.  A wand-like tool (transducer)   will be moved over your chest. The gel will help to transmit the sound waves from the transducer.  The sound waves will harmlessly bounce off of your heart to allow the heart images to be captured in real-time motion. The images will be recorded on a computer. The procedure may vary among health care providers and hospitals. What happens after the procedure?  You may return to your normal, everyday life, including diet, activities, and medicines, unless your health care provider tells you not to do that. Summary  An echocardiogram is a procedure that uses painless sound waves (ultrasound) to produce an image of the heart.  Images from an echocardiogram can provide important information about the size and shape of your heart, heart muscle function, heart valve function, and fluid buildup around your heart.  You do not need to do anything to prepare before this procedure. You may eat and drink normally.  After the echocardiogram is completed, you may return to your normal, everyday life, unless your health care provider tells you not to do that. This information is not intended to replace advice given to you by your health care provider. Make sure you discuss any questions you have with your health care provider. Document Released: 07/06/2000 Document  Revised: 10/30/2018 Document Reviewed: 08/11/2016 Elsevier Patient Education  2020 Elsevier Inc.  

## 2019-02-26 NOTE — Addendum Note (Signed)
Addended by: Beckey Rutter on: 02/26/2019 12:35 PM   Modules accepted: Orders

## 2019-02-26 NOTE — Progress Notes (Signed)
Cardiology Office Note:    Date:  02/26/2019   ID:  Breanna Thomas, DOB April 30, 1981, MRN [id number]  PCP:  Antony Blackbird, MD  Cardiologist:  Jenean Lindau, MD   Referring MD: Breanna Perna, NP    ASSESSMENT:    1. Nonspecific abnormal electrocardiogram (ECG) (EKG)   2. Morbid obesity ([place])   3. Chest discomfort    PLAN:    In order of problems listed above:  1. Chest discomfort: Patient symptoms are atypical for coronary etiology however in view of risk factors she will undergo Lexiscan sestamibi.  She tells me that she is not sexually active and does not want any testing for pregnancy or such.  She also has contraception in place 2. Echocardiogram will be done to assess murmur heard on auscultation 3. Morbid obesity.  Diet was discussed extensively on the importance of regular exercise was stressed.  I told her that if her chest pain symptoms have subsided and stress test is negative then she can initiate gradual graded exercise program and she concurs 4. Patient will be seen in follow-up appointment in 6 months or earlier if the patient has any concerns    Medication Adjustments/Labs and Tests Ordered: Current medicines are reviewed at length with the patient today.  Concerns regarding medicines are outlined above.  No orders of the defined types were placed in this encounter.  No orders of the defined types were placed in this encounter.    History of Present Illness:    Breanna Thomas is a 38 y.o. female who is being seen today for the evaluation of abnormal EKG at the request of Breanna Perna, NP.  Patient has past medical history of anxiety.  She mentions to me that she has chest discomfort at times.  She says that this is more like a pinprick sensation.  She tells me that she has been given NSAIDs for this with relief of symptoms.  No orthopnea or PND.  She is an active lady and activity does not bring around the symptoms.  At the time of my evaluation,  the patient is alert awake oriented and in no distress.  Past Medical History:  Diagnosis Date  . Anemia   . Anxiety   . Anxiety   . Panic attacks   . Vertigo     Past Surgical History:  Procedure Laterality Date  . NO PAST SURGERIES      Current Medications: Current Meds  Medication Sig  . acetaminophen (TYLENOL) 500 MG tablet Take 1,000 mg by mouth every 6 (six) hours as needed for moderate pain.  Marland Kitchen albuterol (PROVENTIL HFA;VENTOLIN HFA) 108 (90 Base) MCG/ACT inhaler Inhale 2 puffs into the lungs every 6 (six) hours as needed for wheezing or shortness of breath.  . etonogestrel (IMPLANON) 68 MG IMPL implant Inject 1 each into the skin once.  . Multiple Vitamin (MULTIVITAMIN) tablet Take 1 tablet by mouth daily.  . Multiple Vitamins-Minerals (AIRBORNE GUMMIES PO) Take 3 tablets by mouth daily.  . naproxen (NAPROSYN) 500 MG tablet Take 500 mg by mouth as needed.  . pimecrolimus (ELIDEL) 1 % cream Apply 1 application topically 2 (two) times daily.   . sertraline (ZOLOFT) 100 MG tablet Take one pill once per day     Allergies:   Patient has no known allergies.   Social History   Socioeconomic History  . Marital status: Single    Spouse name: Not on file  . Number of children: 3  .  Years of education: College  . Highest education level: Not on file  Occupational History    Employer: [employer]    Comment: [employer]  . Financial resource strain: Not on file  . Food insecurity    Worry: Not on file    Inability: Not on file  . Transportation needs    Medical: Not on file    Non-medical: Not on file  Tobacco Use  . Smoking status: Never Smoker  . Smokeless tobacco: Never Used  Substance and Sexual Activity  . Alcohol use: Not Currently    Alcohol/week: 1.0 standard drinks    Types: 1 Glasses of wine per week    Comment: social  . Drug use: No  . Sexual activity: Not Currently    Birth control/protection: Implant  Lifestyle  . Physical  activity    Days per week: Not on file    Minutes per session: Not on file  . Stress: Not on file  Relationships  . Social Herbalist on phone: Not on file    Gets together: Not on file    Attends religious service: Not on file    Active member of club or organization: Not on file    Attends meetings of clubs or organizations: Not on file    Relationship status: Not on file  Other Topics Concern  . Not on file  Social History Narrative   Patient lives at home alone.   Caffeine Use: none in the past 2 weeks, soda occasionally     Family History: The patient's family history includes Diabetes in her mother and sister; Heart disease in her father; Heart failure in an other family member; Hypertension in her mother; Seizures in her father and another family member.  ROS:   Please see the history of present illness.    All other systems reviewed and are negative.  EKGs/Labs/Other Studies Reviewed:    The following studies were reviewed today: As mentioned above.  EKG reveals sinus rhythm with T wave inversions in inferolateral leads   Recent Labs: 01/26/2019: BUN 8; Creatinine, Ser 0.70; Hemoglobin 12.4; Platelets 308; Potassium 4.0; Sodium 137 02/13/2019: TSH 2.270  Recent Lipid Panel No results found for: CHOL, TRIG, HDL, CHOLHDL, VLDL, LDLCALC, LDLDIRECT  Physical Exam:    VS:  BP 122/82   Pulse 84   Ht 5\' 4"  (1.626 m)   Wt 271 lb (122.9 kg)   SpO2 98%   BMI 46.52 kg/m     Wt Readings from Last 3 Encounters:  02/26/19 271 lb (122.9 kg)  02/13/19 268 lb 9.6 oz (121.8 kg)  01/26/19 246 lb (111.6 kg)     GEN: Patient is in no acute distress HEENT: Normal NECK: No JVD; No carotid bruits LYMPHATICS: No lymphadenopathy CARDIAC: S1 S2 regular, 2/6 systolic murmur at the apex. RESPIRATORY:  Clear to auscultation without rales, wheezing or rhonchi  ABDOMEN: Soft, non-tender, non-distended MUSCULOSKELETAL:  No edema; No deformity  SKIN: Warm and dry  NEUROLOGIC:  Alert and oriented x 3 PSYCHIATRIC:  Normal affect    Signed, Jenean Lindau, MD  02/26/2019 12:21 PM    [facility]

## 2019-03-02 ENCOUNTER — Other Ambulatory Visit: Payer: Self-pay

## 2019-03-02 ENCOUNTER — Telehealth: Payer: Self-pay | Admitting: Cardiology

## 2019-03-02 ENCOUNTER — Ambulatory Visit (HOSPITAL_BASED_OUTPATIENT_CLINIC_OR_DEPARTMENT_OTHER)
Admission: RE | Admit: 2019-03-02 | Discharge: 2019-03-02 | Disposition: A | Discharge status: Home / Self Care | Payer: Medicaid Other | Source: Ambulatory Visit | Attending: Cardiology | Admitting: Cardiology

## 2019-03-02 ENCOUNTER — Ambulatory Visit: Payer: Medicaid Other | Admitting: Cardiology

## 2019-03-02 DIAGNOSIS — R0789 Other chest pain: Secondary | ICD-10-CM | POA: Diagnosis not present

## 2019-03-02 DIAGNOSIS — R011 Cardiac murmur, unspecified: Secondary | ICD-10-CM | POA: Diagnosis not present

## 2019-03-02 NOTE — Telephone Encounter (Signed)
Please call Cierra at [facility] precert at [phone] ext [phone] regarding this pt.

## 2019-03-02 NOTE — Progress Notes (Signed)
  Echocardiogram 2D Echocardiogram has been performed.  Breanna Thomas 03/02/2019, 9:57 AM

## 2019-03-03 ENCOUNTER — Telehealth: Payer: Self-pay

## 2019-03-03 NOTE — Telephone Encounter (Signed)
-----   Message from Jenean Lindau, MD sent at 03/02/2019 12:43 PM EDT ----- The results of the study is unremarkable. Please inform patient. I will discuss in detail at next appointment. Cc  primary care/referring physician Jenean Lindau, MD 03/02/2019 12:43 PM

## 2019-03-03 NOTE — Telephone Encounter (Signed)
Information relayed, copy sent to Dr. Chapman Fitch per Dr. Docia Furl request.

## 2019-03-05 ENCOUNTER — Telehealth (HOSPITAL_COMMUNITY): Payer: Self-pay | Admitting: *Deleted

## 2019-03-05 NOTE — Telephone Encounter (Signed)
Patient given detailed instructions per Myocardial Perfusion Study Information Sheet for the test on 03/09/19 at 1:15. Patient notified to arrive 15 minutes early and that it is imperative to arrive on time for appointment to keep from having the test rescheduled.  If you need to cancel or reschedule your appointment, please call the office within 24 hours of your appointment. . Patient verbalized understanding.Breanna Thomas

## 2019-03-09 ENCOUNTER — Other Ambulatory Visit: Payer: Self-pay

## 2019-03-09 ENCOUNTER — Ambulatory Visit (HOSPITAL_COMMUNITY): Payer: Medicaid Other | Attending: Cardiology

## 2019-03-09 ENCOUNTER — Encounter (HOSPITAL_COMMUNITY): Payer: Self-pay

## 2019-03-09 ENCOUNTER — Encounter (HOSPITAL_COMMUNITY): Payer: Medicaid Other

## 2019-03-09 VITALS — Ht 64.0 in | Wt 271.0 lb

## 2019-03-09 DIAGNOSIS — R079 Chest pain, unspecified: Secondary | ICD-10-CM | POA: Diagnosis not present

## 2019-03-09 DIAGNOSIS — R0789 Other chest pain: Secondary | ICD-10-CM | POA: Diagnosis not present

## 2019-03-09 MED ORDER — TECHNETIUM TC 99M TETROFOSMIN IV KIT
32.0000 | PACK | Freq: Once | INTRAVENOUS | Status: AC | PRN
  Administered 2019-03-09: 32 via INTRAVENOUS
  Filled 2019-03-09: qty 32

## 2019-03-09 MED ORDER — REGADENOSON 0.4 MG/5ML IV SOLN
0.4000 mg | Freq: Once | INTRAVENOUS | Status: AC
  Administered 2019-03-09: 0.4 mg via INTRAVENOUS

## 2019-03-10 ENCOUNTER — Ambulatory Visit (HOSPITAL_COMMUNITY): Payer: Medicaid Other | Attending: Cardiovascular Disease

## 2019-03-10 LAB — MYOCARDIAL PERFUSION IMAGING
LV dias vol: 106 mL (ref 46–106)
LV sys vol: 53 mL
Peak HR: 116 {beats}/min
Rest HR: 83 {beats}/min
SDS: 1
SRS: 0
SSS: 1
TID: 1.01

## 2019-03-10 MED ORDER — TECHNETIUM TC 99M TETROFOSMIN IV KIT
30.5000 | PACK | Freq: Once | INTRAVENOUS | Status: AC | PRN
  Administered 2019-03-10: 30.5 via INTRAVENOUS
  Filled 2019-03-10: qty 31

## 2019-03-12 ENCOUNTER — Encounter: Payer: Self-pay | Admitting: *Deleted

## 2019-03-13 ENCOUNTER — Ambulatory Visit: Payer: Medicaid Other | Admitting: Family Medicine

## 2019-03-16 DIAGNOSIS — U071 COVID-19: Secondary | ICD-10-CM | POA: Diagnosis not present

## 2019-03-25 DIAGNOSIS — U071 COVID-19: Secondary | ICD-10-CM | POA: Diagnosis not present

## 2019-03-26 ENCOUNTER — Ambulatory Visit: Payer: Medicaid Other | Admitting: Family Medicine

## 2019-04-01 DIAGNOSIS — U071 COVID-19: Secondary | ICD-10-CM | POA: Diagnosis not present

## 2019-04-08 ENCOUNTER — Ambulatory Visit: Payer: Medicaid Other | Admitting: Family Medicine

## 2019-04-08 DIAGNOSIS — Z20828 Contact with and (suspected) exposure to other viral communicable diseases: Secondary | ICD-10-CM | POA: Diagnosis not present

## 2019-04-08 DIAGNOSIS — U071 COVID-19: Secondary | ICD-10-CM | POA: Diagnosis not present

## 2019-04-15 DIAGNOSIS — U071 COVID-19: Secondary | ICD-10-CM | POA: Diagnosis not present

## 2019-04-22 DIAGNOSIS — U071 COVID-19: Secondary | ICD-10-CM | POA: Diagnosis not present

## 2019-04-22 DIAGNOSIS — Z20828 Contact with and (suspected) exposure to other viral communicable diseases: Secondary | ICD-10-CM | POA: Diagnosis not present

## 2019-04-23 ENCOUNTER — Ambulatory Visit: Payer: Medicaid Other | Admitting: Family Medicine

## 2019-04-28 ENCOUNTER — Ambulatory Visit (HOSPITAL_COMMUNITY)
Admission: EM | Admit: 2019-04-28 | Discharge: 2019-04-28 | Disposition: A | Discharge status: Home / Self Care | Payer: Medicaid Other | Attending: Urgent Care | Admitting: Urgent Care

## 2019-04-28 ENCOUNTER — Encounter (HOSPITAL_COMMUNITY): Payer: Self-pay

## 2019-04-28 DIAGNOSIS — R197 Diarrhea, unspecified: Secondary | ICD-10-CM

## 2019-04-28 DIAGNOSIS — K529 Noninfective gastroenteritis and colitis, unspecified: Secondary | ICD-10-CM

## 2019-04-28 MED ORDER — ONDANSETRON 8 MG PO TBDP: 8.0000 mg | ORAL_TABLET | Freq: Three times a day (TID) | ORAL | 0 refills | Status: DC | PRN

## 2019-04-28 MED ORDER — LOPERAMIDE HCL 2 MG PO CAPS: 2.0000 mg | ORAL_CAPSULE | Freq: Every day | ORAL | 0 refills | Status: DC | PRN

## 2019-04-28 NOTE — ED Triage Notes (Signed)
Pt report she had 3 loose stools this morning and weak.

## 2019-04-28 NOTE — ED Provider Notes (Signed)
MRN: [id number] DOB: [birth year]  Subjective:   Breanna Thomas is a 38 y.o. female presenting for acute onset of diarrhea this morning.  Patient states that she has had 3-4 loose stools.  She also woke up feeling fatigued, ill.  States that she ate a shrimp salad last night.  Denies any recent antibiotics, hospitalizations.  Denies drinking any unfiltered water or starting to eat new foods that she is unfamiliar with.  She does not take any medications for relief.  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  All her tests have been negative.  No current facility-administered medications for this encounter.   Current Outpatient Medications:  .  acetaminophen (TYLENOL) 500 MG tablet, Take 1,000 mg by mouth every 6 (six) hours as needed for moderate pain., Disp: , Rfl:  .  albuterol (PROVENTIL HFA;VENTOLIN HFA) 108 (90 Base) MCG/ACT inhaler, Inhale 2 puffs into the lungs every 6 (six) hours as needed for wheezing or shortness of breath., Disp: 1 Inhaler, Rfl: 2 .  etonogestrel (IMPLANON) 68 MG IMPL implant, Inject 1 each into the skin once., Disp: , Rfl:  .  Multiple Vitamin (MULTIVITAMIN) tablet, Take 1 tablet by mouth daily., Disp: , Rfl:  .  Multiple Vitamins-Minerals (AIRBORNE GUMMIES PO), Take 3 tablets by mouth daily., Disp: , Rfl:  .  naproxen (NAPROSYN) 500 MG tablet, Take 500 mg by mouth as needed., Disp: , Rfl:  .  pimecrolimus (ELIDEL) 1 % cream, Apply 1 application topically 2 (two) times daily. , Disp: , Rfl:  .  sertraline (ZOLOFT) 100 MG tablet, Take one pill once per day, Disp: 30 tablet, Rfl: 4   No Known Allergies  Past Medical History:  Diagnosis Date  . Anemia   . Anxiety   . Anxiety   . Panic attacks   . Vertigo      Past Surgical History:  Procedure Laterality Date  . NO PAST SURGERIES      Review of Systems  Constitutional: Negative for fever and malaise/fatigue.  HENT: Negative for congestion, ear pain, sinus pain  and sore throat.   Eyes: Negative for discharge and redness.  Respiratory: Negative for cough, hemoptysis, shortness of breath and wheezing.   Cardiovascular: Negative for chest pain.  Gastrointestinal: Positive for diarrhea. Negative for abdominal pain, blood in stool, constipation, nausea and vomiting.  Genitourinary: Negative for dysuria, flank pain and hematuria.  Musculoskeletal: Negative for myalgias.  Skin: Negative for rash.  Neurological: Negative for dizziness, weakness and headaches.  Psychiatric/Behavioral: Negative for depression and substance abuse.    Objective:   Vitals: BP 122/80 (BP Location: Left Arm)   Pulse 89   Temp 97.7 F (36.5 C) (Temporal)   Resp 16   SpO2 99%   Physical Exam Constitutional:      General: She is not in acute distress.    Appearance: Normal appearance. She is well-developed and normal weight. She is not ill-appearing, toxic-appearing or diaphoretic.  HENT:     Head: Normocephalic and atraumatic.     Right Ear: External ear normal.     Left Ear: External ear normal.     Nose: Nose normal.     Mouth/Throat:     Mouth: Mucous membranes are moist.     Pharynx: Oropharynx is clear.  Eyes:     General: No scleral icterus.    Extraocular Movements: Extraocular movements intact.     Pupils: Pupils are equal, round, and reactive to light.  Cardiovascular:     Rate and Rhythm: Normal rate and regular rhythm.     Heart sounds: Normal heart sounds. No murmur. No friction rub. No gallop.   Pulmonary:     Effort: Pulmonary effort is normal. No respiratory distress.     Breath sounds: Normal breath sounds. No stridor. No wheezing, rhonchi or rales.  Abdominal:     General: Bowel sounds are normal. There is no distension.     Palpations: Abdomen is soft. There is no mass.     Tenderness: There is no abdominal tenderness. There is no right CVA tenderness, left CVA tenderness, guarding or rebound.  Skin:    General: Skin is warm and dry.      Coloration: Skin is not pale.     Findings: No rash.  Neurological:     General: No focal deficit present.     Mental Status: She is alert and oriented to person, place, and time.  Psychiatric:        Mood and Affect: Mood normal.        Behavior: Behavior normal.        Thought Content: Thought content normal.        Judgment: Judgment normal.     Assessment and Plan :   1. Gastroenteritis   2. Diarrhea, unspecified type     We will manage for gastroenteritis with supportive care.  Recommended patient hydrate well, eat light meals and maintain electrolytes.  Will use Zofran should she develop nausea, vomiting and use Imodium for diarrhea.  Low suspicion for COVID-19, patient gets tested through her employer anyway.  Counseled patient on potential for adverse effects with medications prescribed/recommended today, ER and return-to-clinic precautions discussed, patient verbalized understanding.    Jaynee Eagles, PA-C 04/28/19 1328

## 2019-05-06 DIAGNOSIS — U071 COVID-19: Secondary | ICD-10-CM | POA: Diagnosis not present

## 2019-05-06 DIAGNOSIS — Z20828 Contact with and (suspected) exposure to other viral communicable diseases: Secondary | ICD-10-CM | POA: Diagnosis not present

## 2019-05-13 DIAGNOSIS — Z20828 Contact with and (suspected) exposure to other viral communicable diseases: Secondary | ICD-10-CM | POA: Diagnosis not present

## 2019-05-13 DIAGNOSIS — U071 COVID-19: Secondary | ICD-10-CM | POA: Diagnosis not present

## 2019-05-18 ENCOUNTER — Encounter (HOSPITAL_COMMUNITY): Payer: Self-pay

## 2019-05-18 ENCOUNTER — Other Ambulatory Visit: Payer: Self-pay

## 2019-05-18 ENCOUNTER — Ambulatory Visit (HOSPITAL_COMMUNITY)
Admission: EM | Admit: 2019-05-18 | Discharge: 2019-05-18 | Disposition: A | Discharge status: Home / Self Care | Payer: Medicaid Other | Attending: Family Medicine | Admitting: Family Medicine

## 2019-05-18 DIAGNOSIS — M79605 Pain in left leg: Secondary | ICD-10-CM

## 2019-05-18 NOTE — ED Provider Notes (Signed)
[place]    CSN: [id number] Arrival date & time: 05/18/19  1906      History   Chief Complaint Chief Complaint  Patient presents with  . Leg Pain    HPI Breanna Thomas is a 38 y.o. female.   She is presenting with a hard knot on the left upper anterior thigh.  She hit the side of a table 2 weeks ago and has not has developed.  There is no redness or pain associated with it.  This is ongoing.  She has been placing ice on it with no improvement.  Denies any changes during that time.  He does feel hard to palpation.  HPI  Past Medical History:  Diagnosis Date  . Anemia   . Anxiety   . Anxiety   . Panic attacks   . Vertigo     Patient Active Problem List   Diagnosis Date Noted  . Nonspecific abnormal electrocardiogram (ECG) (EKG) 02/26/2019  . Morbid obesity ([place]) 02/26/2019  . Chest discomfort 02/26/2019  . Erroneous encounter - disregard 01/25/2019  . GERD (gastroesophageal reflux disease) 04/20/2016  . Cough 04/20/2016  . IFG (impaired fasting glucose) 12/30/2013  . Unspecified vitamin D deficiency 12/30/2013  . Back muscle spasm 12/15/2013  . Anxiety 04/10/2013    Past Surgical History:  Procedure Laterality Date  . NO PAST SURGERIES      OB History    Gravida  3   Para  3   Term      Preterm      AB      Living        SAB      TAB      Ectopic      Multiple      Live Births               Home Medications    Prior to Admission medications   Medication Sig Start Date End Date Taking? Authorizing Provider  acetaminophen (TYLENOL) 500 MG tablet Take 1,000 mg by mouth every 6 (six) hours as needed for moderate pain.    [provider]  albuterol (PROVENTIL HFA;VENTOLIN HFA) 108 (90 Base) MCG/ACT inhaler Inhale 2 puffs into the lungs every 6 (six) hours as needed for wheezing or shortness of breath. 05/08/18   Zigmund Gottron, NP  etonogestrel (IMPLANON) 68 MG IMPL implant Inject 1 each into the skin once.     [provider]  loperamide (IMODIUM) 2 MG capsule Take 1 capsule (2 mg total) by mouth daily as needed for diarrhea or loose stools. 04/28/19   Jaynee Eagles, PA-C  Multiple Vitamin (MULTIVITAMIN) tablet Take 1 tablet by mouth daily.    [provider]  Multiple Vitamins-Minerals (AIRBORNE GUMMIES PO) Take 3 tablets by mouth daily.    [provider]  ondansetron (ZOFRAN-ODT) 8 MG disintegrating tablet Take 1 tablet (8 mg total) by mouth every 8 (eight) hours as needed for nausea or vomiting. 04/28/19   Jaynee Eagles, PA-C  sertraline (ZOLOFT) 100 MG tablet Take one pill once per day 02/13/19   Antony Blackbird, MD    Family History Family History  Problem Relation Age of Onset  . Diabetes Mother   . Hypertension Mother   . Heart disease Father   . Seizures Father   . Seizures Other   . Heart failure Other   . Diabetes Sister     Social History Social History   Tobacco Use  . Smoking status:  Never Smoker  . Smokeless tobacco: Never Used  Substance Use Topics  . Alcohol use: Not Currently    Alcohol/week: 1.0 standard drinks    Types: 1 Glasses of wine per week    Comment: social  . Drug use: No     Allergies   Patient has no known allergies.   Review of Systems Review of Systems  Constitutional: Negative for fever.  HENT: Negative for congestion.   Respiratory: Negative for cough.   Cardiovascular: Negative for chest pain.  Gastrointestinal: Negative for abdominal pain.  Musculoskeletal: Negative for joint swelling.  Skin: Negative for color change.  Neurological: Negative for weakness.  Hematological: Negative for adenopathy.     Physical Exam Triage Vital Signs ED Triage Vitals  Enc Vitals Group     BP 05/18/19 1949 (!) 137/96     Pulse Rate 05/18/19 1949 86     Resp 05/18/19 1949 20     Temp 05/18/19 1949 98.7 F (37.1 C)     Temp Source 05/18/19 1949 Oral     SpO2 05/18/19 1949 100 %     Weight 05/18/19 1948 270 lb (122.5 kg)      Height --      Head Circumference --      Peak Flow --      Pain Score 05/18/19 1948 5     Pain Loc --      Pain Edu? --      Excl. in [place]? --    No data found.  Updated Vital Signs BP (!) 137/96 (BP Location: Right Arm)   Pulse 86   Temp 98.7 F (37.1 C) (Oral)   Resp 20   Wt 122.5 kg   LMP 05/13/2019   SpO2 100%   BMI 46.35 kg/m   Visual Acuity Right Eye Distance:   Left Eye Distance:   Bilateral Distance:    Right Eye Near:   Left Eye Near:    Bilateral Near:     Physical Exam Gen: NAD, alert, cooperative with exam, well-appearing ENT: normal lips, normal nasal mucosa,  Eye: normal EOM, normal conjunctiva and lids CV:  no edema, +2 pedal pulses   Resp: no accessory muscle use, non-labored,  Skin: no rashes, no areas of induration  Neuro: normal tone, normal sensation to touch Psych:  normal insight, alert and oriented MSK:  Left thigh:  No swelling or ecchymosis  Normal IR and ER  Palpable hard area on the upper thigh.  NVI     UC Treatments / Results  Labs (all labs ordered are listed, but only abnormal results are displayed) Labs Reviewed - No data to display  EKG   Radiology No results found.  Procedures Procedures (including critical care time)  Medications Ordered in UC Medications - No data to display  Initial Impression / Assessment and Plan / UC Course  I have reviewed the triage vital signs and the nursing notes.  Pertinent labs & imaging results that were available during my care of the patient were reviewed by me and considered in my medical decision making (see chart for details).     [name] is a 38 yo female that is presenting with likely a left upper thigh hematoma.  She had a trauma from that incident.  No ecchymosis or swelling today.  It is palpable in the left upper thigh.  Counseled on supportive care.  Give indications follow-up.  Final Clinical Impressions(s) / UC Diagnoses   Final diagnoses:  Left leg pain  Discharge Instructions     Please try compression and heat  Please follow up if no improvement.     ED Prescriptions    None     PDMP not reviewed this encounter.   Rosemarie Ax, MD 05/18/19 2136

## 2019-05-18 NOTE — ED Triage Notes (Signed)
Pt states she hit her left leg on the side of a table. Pt states she has a knot on her left upper thigh. This happened 2 weeks a go.

## 2019-05-18 NOTE — Discharge Instructions (Signed)
Please try compression and heat  Please follow up if no improvement.

## 2019-05-20 DIAGNOSIS — U071 COVID-19: Secondary | ICD-10-CM | POA: Diagnosis not present

## 2019-05-20 DIAGNOSIS — Z20828 Contact with and (suspected) exposure to other viral communicable diseases: Secondary | ICD-10-CM | POA: Diagnosis not present

## 2019-06-01 ENCOUNTER — Ambulatory Visit: Payer: Medicaid Other | Attending: Family Medicine

## 2019-06-01 ENCOUNTER — Other Ambulatory Visit: Payer: Self-pay

## 2019-06-01 DIAGNOSIS — Z111 Encounter for screening for respiratory tuberculosis: Secondary | ICD-10-CM | POA: Diagnosis not present

## 2019-06-01 NOTE — Progress Notes (Signed)
Patient arrived to clinic to receive a TB skin test.  Patient was given the tb test in left forearm and was informed to return to office in 48-72 hours for reading.

## 2019-06-02 ENCOUNTER — Encounter (HOSPITAL_COMMUNITY): Payer: Self-pay

## 2019-06-02 ENCOUNTER — Other Ambulatory Visit: Payer: Self-pay

## 2019-06-02 ENCOUNTER — Ambulatory Visit (HOSPITAL_COMMUNITY)
Admission: EM | Admit: 2019-06-02 | Discharge: 2019-06-02 | Disposition: A | Discharge status: Home / Self Care | Payer: Medicaid Other | Attending: Family Medicine | Admitting: Family Medicine

## 2019-06-02 DIAGNOSIS — M26622 Arthralgia of left temporomandibular joint: Secondary | ICD-10-CM

## 2019-06-02 MED ORDER — PREDNISONE 20 MG PO TABS: 20.0000 mg | ORAL_TABLET | Freq: Two times a day (BID) | ORAL | 0 refills | Status: DC

## 2019-06-02 NOTE — Discharge Instructions (Signed)
Take the prednisone for 3 days Take two doses today Ice or heat to area Soft food After the prednisone may take ibuprofen or naproxen Follow up with your PCP

## 2019-06-02 NOTE — ED Triage Notes (Signed)
Patient presents to Urgent Care with complaints of left sided dental pain since last night. Patient reports it hurts more when she opens her mouth all the way.

## 2019-06-02 NOTE — ED Provider Notes (Signed)
[facility]    CSN: [id number] Arrival date & time: 06/02/19  [id number]      History   Chief Complaint Chief Complaint  Patient presents with  . Dental Pain    HPI Breanna Thomas is a 38 y.o. female.   HPI  Patient is here for face pain.  She hurts the left side of her face.  Hurts when she opens her mouth.  Hurts when she bites down.  This happened in the middle of the night last night.  It woke her up at 2 AM.  No injury.  No trauma.  No different foods or excessive chewing gum.  She has had this before. I asked if she ever had TMJ and she reports yes, but it was years ago.  No dental problems or dental pain.  No caries.  Past Medical History:  Diagnosis Date  . Anemia   . Anxiety   . Anxiety   . Panic attacks   . Vertigo     Patient Active Problem List   Diagnosis Date Noted  . Nonspecific abnormal electrocardiogram (ECG) (EKG) 02/26/2019  . Morbid obesity ([place]) 02/26/2019  . Chest discomfort 02/26/2019  . Erroneous encounter - disregard 01/25/2019  . GERD (gastroesophageal reflux disease) 04/20/2016  . Cough 04/20/2016  . IFG (impaired fasting glucose) 12/30/2013  . Unspecified vitamin D deficiency 12/30/2013  . Back muscle spasm 12/15/2013  . Anxiety 04/10/2013    Past Surgical History:  Procedure Laterality Date  . NO PAST SURGERIES      OB History    Gravida  3   Para  3   Term      Preterm      AB      Living        SAB      TAB      Ectopic      Multiple      Live Births               Home Medications    Prior to Admission medications   Medication Sig Start Date End Date Taking? Authorizing Provider  sertraline (ZOLOFT) 100 MG tablet Take one pill once per day 02/13/19  Yes Fulp, Cammie, MD  acetaminophen (TYLENOL) 500 MG tablet Take 1,000 mg by mouth every 6 (six) hours as needed for moderate pain.    [provider]  albuterol (PROVENTIL HFA;VENTOLIN HFA) 108 (90 Base) MCG/ACT inhaler Inhale 2 puffs  into the lungs every 6 (six) hours as needed for wheezing or shortness of breath. 05/08/18   Zigmund Gottron, NP  etonogestrel (IMPLANON) 68 MG IMPL implant Inject 1 each into the skin once.    [provider]  Multiple Vitamin (MULTIVITAMIN) tablet Take 1 tablet by mouth daily.    [provider]  Multiple Vitamins-Minerals (AIRBORNE GUMMIES PO) Take 3 tablets by mouth daily.    [provider]  predniSONE (DELTASONE) 20 MG tablet Take 1 tablet (20 mg total) by mouth 2 (two) times daily with a meal. 06/02/19   Raylene Everts, MD    Family History Family History  Problem Relation Age of Onset  . Diabetes Mother   . Hypertension Mother   . Heart disease Father   . Seizures Father   . Seizures Other   . Heart failure Other   . Diabetes Sister     Social History Social History   Tobacco Use  . Smoking status: Never Smoker  . Smokeless tobacco:  Never Used  Substance Use Topics  . Alcohol use: Not Currently    Alcohol/week: 1.0 standard drinks    Types: 1 Glasses of wine per week    Comment: social  . Drug use: No     Allergies   Patient has no known allergies.   Review of Systems Review of Systems  Constitutional: Negative for chills and fever.  HENT: Negative for ear pain and sore throat.   Eyes: Negative for pain and visual disturbance.  Respiratory: Negative for cough and shortness of breath.   Cardiovascular: Negative for chest pain and palpitations.  Gastrointestinal: Negative for abdominal pain and vomiting.  Genitourinary: Negative for dysuria and hematuria.  Musculoskeletal: Negative for arthralgias and back pain.  Skin: Negative for color change and rash.  Neurological: Negative for seizures and syncope.  All other systems reviewed and are negative.    Physical Exam Triage Vital Signs ED Triage Vitals  Enc Vitals Group     BP 06/02/19 0931 131/78     Pulse Rate 06/02/19 0931 80     Resp 06/02/19 0931 16     Temp  06/02/19 0931 99.5 F (37.5 C)     Temp Source 06/02/19 0931 Oral     SpO2 06/02/19 0931 100 %     Weight --      Height --      Head Circumference --      Peak Flow --      Pain Score 06/02/19 0929 7     Pain Loc --      Pain Edu? --      Excl. in [place]? --    No data found.  Updated Vital Signs BP 131/78 (BP Location: Left Arm)   Pulse 80   Temp 99.5 F (37.5 C) (Oral)   Resp 16   LMP 05/13/2019   SpO2 100%      Physical Exam Constitutional:      General: She is not in acute distress.    Appearance: She is well-developed.  HENT:     Head: Normocephalic and atraumatic.     Right Ear: Tympanic membrane and ear canal normal.     Left Ear: Tympanic membrane and ear canal normal.     Nose: Nose normal.     Mouth/Throat:     Mouth: Mucous membranes are moist.     Comments: Good dentition.  Tender left TMJ, pain with palpation Eyes:     Conjunctiva/sclera: Conjunctivae normal.     Pupils: Pupils are equal, round, and reactive to light.  Neck:     Musculoskeletal: Normal range of motion.  Cardiovascular:     Rate and Rhythm: Normal rate.  Pulmonary:     Effort: Pulmonary effort is normal. No respiratory distress.  Abdominal:     General: There is no distension.     Palpations: Abdomen is soft.  Musculoskeletal: Normal range of motion.  Skin:    General: Skin is warm and dry.  Neurological:     Mental Status: She is alert.      UC Treatments / Results  Labs (all labs ordered are listed, but only abnormal results are displayed) Labs Reviewed - No data to display  EKG   Radiology No results found.  Procedures Procedures (including critical care time)  Medications Ordered in UC Medications - No data to display  Initial Impression / Assessment and Plan / UC Course  I have reviewed the triage vital signs and the nursing notes.  Pertinent  labs & imaging results that were available during my care of the patient were reviewed by me and considered in my  medical decision making (see chart for details).     Discussed causes.  Treatment.  Follow-up. Final Clinical Impressions(s) / UC Diagnoses   Final diagnoses:  Arthralgia of left temporomandibular joint     Discharge Instructions     Take the prednisone for 3 days Take two doses today Ice or heat to area Soft food After the prednisone may take ibuprofen or naproxen Follow up with your PCP   ED Prescriptions    Medication Sig Dispense Auth. Provider   predniSONE (DELTASONE) 20 MG tablet Take 1 tablet (20 mg total) by mouth 2 (two) times daily with a meal. 6 tablet Raylene Everts, MD     PDMP not reviewed this encounter.   Raylene Everts, MD 06/02/19 1124

## 2019-06-03 ENCOUNTER — Telehealth: Payer: Self-pay | Admitting: Family Medicine

## 2019-06-03 ENCOUNTER — Ambulatory Visit: Payer: Medicaid Other | Admitting: Family Medicine

## 2019-06-03 ENCOUNTER — Ambulatory Visit: Payer: Medicaid Other | Attending: Family Medicine | Admitting: *Deleted

## 2019-06-03 DIAGNOSIS — Z111 Encounter for screening for respiratory tuberculosis: Secondary | ICD-10-CM

## 2019-06-03 LAB — TB SKIN TEST
Induration: 0 mm
TB Skin Test: NEGATIVE

## 2019-06-03 NOTE — Progress Notes (Signed)
Patient presented with no fever and no concerns.

## 2019-06-03 NOTE — Telephone Encounter (Signed)
Patient came to the facility to drop off a health assessment form to be filled out by her PCP. Form will be placed at PCP box and patient was informed that once it has been completed the nurse would call her.

## 2019-06-10 ENCOUNTER — Telehealth: Payer: Self-pay | Admitting: Family Medicine

## 2019-06-10 NOTE — Telephone Encounter (Signed)
Patient called asking for her from that was dropped off on the 11 to be competed and ready for pick by to day. Patient stated if she didn't turn the form in she could lose her job.

## 2019-06-10 NOTE — Telephone Encounter (Signed)
See original message.

## 2019-06-10 NOTE — Telephone Encounter (Signed)
Please share if you have patients form.

## 2019-06-10 NOTE — Telephone Encounter (Signed)
I did locate the patient's paperwork and form was completed however I have only seen patient once on 02/13/2019. The form is under the keyboard on your desk.

## 2019-06-12 NOTE — Telephone Encounter (Signed)
Patient received paperwork in hand

## 2019-07-06 ENCOUNTER — Ambulatory Visit (HOSPITAL_COMMUNITY)
Admission: EM | Admit: 2019-07-06 | Discharge: 2019-07-06 | Disposition: A | Discharge status: Home / Self Care | Payer: Medicaid Other | Attending: Internal Medicine | Admitting: Internal Medicine

## 2019-07-06 ENCOUNTER — Other Ambulatory Visit: Payer: Self-pay

## 2019-07-06 ENCOUNTER — Encounter (HOSPITAL_COMMUNITY): Payer: Self-pay

## 2019-07-06 DIAGNOSIS — Z6841 Body Mass Index (BMI) 40.0 and over, adult: Secondary | ICD-10-CM | POA: Insufficient documentation

## 2019-07-06 DIAGNOSIS — F410 Panic disorder [episodic paroxysmal anxiety] without agoraphobia: Secondary | ICD-10-CM | POA: Insufficient documentation

## 2019-07-06 DIAGNOSIS — R1084 Generalized abdominal pain: Secondary | ICD-10-CM | POA: Diagnosis not present

## 2019-07-06 DIAGNOSIS — Z20828 Contact with and (suspected) exposure to other viral communicable diseases: Secondary | ICD-10-CM | POA: Diagnosis not present

## 2019-07-06 DIAGNOSIS — Z793 Long term (current) use of hormonal contraceptives: Secondary | ICD-10-CM | POA: Insufficient documentation

## 2019-07-06 DIAGNOSIS — Z79899 Other long term (current) drug therapy: Secondary | ICD-10-CM | POA: Insufficient documentation

## 2019-07-06 DIAGNOSIS — M62830 Muscle spasm of back: Secondary | ICD-10-CM | POA: Diagnosis not present

## 2019-07-06 DIAGNOSIS — R197 Diarrhea, unspecified: Secondary | ICD-10-CM | POA: Insufficient documentation

## 2019-07-06 DIAGNOSIS — Z833 Family history of diabetes mellitus: Secondary | ICD-10-CM | POA: Diagnosis not present

## 2019-07-06 DIAGNOSIS — R7301 Impaired fasting glucose: Secondary | ICD-10-CM | POA: Diagnosis not present

## 2019-07-06 DIAGNOSIS — Z7952 Long term (current) use of systemic steroids: Secondary | ICD-10-CM | POA: Insufficient documentation

## 2019-07-06 LAB — POC SARS CORONAVIRUS 2 AG: SARS Coronavirus 2 Ag: NEGATIVE

## 2019-07-06 LAB — POC SARS CORONAVIRUS 2 AG -  ED: SARS Coronavirus 2 Ag: NEGATIVE

## 2019-07-06 NOTE — ED Triage Notes (Signed)
Pt states she has diarrhea, stomach pain, and chills this all started yesterday.

## 2019-07-06 NOTE — ED Provider Notes (Signed)
[facility]    CSN: [id number] Arrival date & time: 07/06/19  1321      History   Chief Complaint Chief Complaint  Patient presents with  . Abdominal Pain    HPI ADALIE HANBERRY is a 38 y.o. female with a history of panic attacks and anxiety comes to urgent care with 2-day history of diarrhea, generalized abdominal pain and chills.  Patient denies any sick contacts.  No nausea or vomiting.  She had several nonbloody bowel movements yesterday.  No abdominal distention.   Patient complains of chills.  No change in dietary habits.  No history of travel.  Abdominal pain is crampy in nature.  No known aggravating or relieving factors.  Patient denies any increased thirst.  HPI  Past Medical History:  Diagnosis Date  . Anemia   . Anxiety   . Anxiety   . Panic attacks   . Vertigo     Patient Active Problem List   Diagnosis Date Noted  . Nonspecific abnormal electrocardiogram (ECG) (EKG) 02/26/2019  . Morbid obesity ([place]) 02/26/2019  . Chest discomfort 02/26/2019  . Erroneous encounter - disregard 01/25/2019  . GERD (gastroesophageal reflux disease) 04/20/2016  . Cough 04/20/2016  . IFG (impaired fasting glucose) 12/30/2013  . Unspecified vitamin D deficiency 12/30/2013  . Back muscle spasm 12/15/2013  . Anxiety 04/10/2013    Past Surgical History:  Procedure Laterality Date  . NO PAST SURGERIES      OB History    Gravida  3   Para  3   Term      Preterm      AB      Living        SAB      TAB      Ectopic      Multiple      Live Births               Home Medications    Prior to Admission medications   Medication Sig Start Date End Date Taking? Authorizing Provider  acetaminophen (TYLENOL) 500 MG tablet Take 1,000 mg by mouth every 6 (six) hours as needed for moderate pain.    [provider]  albuterol (PROVENTIL HFA;VENTOLIN HFA) 108 (90 Base) MCG/ACT inhaler Inhale 2 puffs into the lungs every 6 (six) hours as  needed for wheezing or shortness of breath. 05/08/18   Zigmund Gottron, NP  etonogestrel (IMPLANON) 68 MG IMPL implant Inject 1 each into the skin once.    [provider]  Multiple Vitamin (MULTIVITAMIN) tablet Take 1 tablet by mouth daily.    [provider]  Multiple Vitamins-Minerals (AIRBORNE GUMMIES PO) Take 3 tablets by mouth daily.    [provider]  predniSONE (DELTASONE) 20 MG tablet Take 1 tablet (20 mg total) by mouth 2 (two) times daily with a meal. 06/02/19   Raylene Everts, MD  sertraline (ZOLOFT) 100 MG tablet Take one pill once per day 02/13/19   Antony Blackbird, MD    Family History Family History  Adopted: Yes  Problem Relation Age of Onset  . Diabetes Mother   . Hypertension Mother   . Heart disease Father   . Seizures Father   . Seizures Other   . Heart failure Other   . Diabetes Sister     Social History Social History   Tobacco Use  . Smoking status: Never Smoker  . Smokeless tobacco: Never Used  Substance Use Topics  . Alcohol use:  Not Currently    Alcohol/week: 1.0 standard drinks    Types: 1 Glasses of wine per week    Comment: social  . Drug use: No     Allergies   Patient has no known allergies.   Review of Systems Review of Systems  Constitutional: Positive for chills. Negative for fatigue, fever and unexpected weight change.  HENT: Negative.   Respiratory: Negative for cough, chest tightness and shortness of breath.   Cardiovascular: Negative for chest pain and palpitations.  Gastrointestinal: Positive for abdominal pain, diarrhea and nausea. Negative for abdominal distention, blood in stool and constipation.  Genitourinary: Negative for dysuria, flank pain, frequency and urgency.  Musculoskeletal: Negative.   Neurological: Negative for dizziness, weakness, light-headedness and headaches.     Physical Exam Triage Vital Signs ED Triage Vitals  Enc Vitals Group     BP 07/06/19 1408 122/79     Pulse Rate  07/06/19 1408 (!) 104     Resp 07/06/19 1408 18     Temp 07/06/19 1408 99.3 F (37.4 C)     Temp Source 07/06/19 1408 Oral     SpO2 07/06/19 1408 98 %     Weight 07/06/19 1406 270 lb (122.5 kg)     Height --      Head Circumference --      Peak Flow --      Pain Score 07/06/19 1406 6     Pain Loc --      Pain Edu? --      Excl. in [place]? --    No data found.  Updated Vital Signs BP 122/79 (BP Location: Right Arm)   Pulse (!) 104   Temp 99.3 F (37.4 C) (Oral)   Resp 18   Wt 122.5 kg   LMP 06/23/2019   SpO2 98%   BMI 46.35 kg/m   Visual Acuity Right Eye Distance:   Left Eye Distance:   Bilateral Distance:    Right Eye Near:   Left Eye Near:    Bilateral Near:     Physical Exam Vitals and nursing note reviewed.  Constitutional:      General: She is not in acute distress.    Appearance: She is not ill-appearing.  Cardiovascular:     Rate and Rhythm: Normal rate and regular rhythm.  Pulmonary:     Effort: Pulmonary effort is normal. No respiratory distress.     Breath sounds: Normal breath sounds. No wheezing or rhonchi.  Abdominal:     General: Abdomen is protuberant. Bowel sounds are normal. There is no distension or abdominal bruit.     Palpations: Abdomen is soft. There is no hepatomegaly or splenomegaly.     Tenderness: There is no abdominal tenderness.  Skin:    General: Skin is warm.     Capillary Refill: Capillary refill takes less than 2 seconds.     Coloration: Skin is not mottled or pale.  Neurological:     General: No focal deficit present.     Mental Status: She is alert.     Motor: No weakness.      UC Treatments / Results  Labs (all labs ordered are listed, but only abnormal results are displayed) Labs Reviewed  NOVEL CORONAVIRUS, NAA (HOSP ORDER, SEND-OUT TO REF LAB; TAT 18-24 HRS)  POC SARS CORONAVIRUS 2 AG -  ED  POC SARS CORONAVIRUS 2 AG    EKG   Radiology No results found.  Procedures Procedures (including critical care  time)  Medications  Ordered in UC Medications - No data to display  Initial Impression / Assessment and Plan / UC Course  I have reviewed the triage vital signs and the nursing notes.  Pertinent labs & imaging results that were available during my care of the patient were reviewed by me and considered in my medical decision making (see chart for details).     1.  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 Final Clinical Impressions(s) / UC Diagnoses   Final diagnoses:  Diarrhea, unspecified type   Discharge Instructions   None    ED Prescriptions    None     PDMP not reviewed this encounter.   Chase Picket, MD 07/06/19 [phone]

## 2019-07-08 ENCOUNTER — Ambulatory Visit: Payer: Medicaid Other | Admitting: Cardiology

## 2019-07-08 LAB — NOVEL CORONAVIRUS, NAA (HOSP ORDER, SEND-OUT TO REF LAB; TAT 18-24 HRS): SARS-CoV-2, NAA: NOT DETECTED

## 2019-07-27 ENCOUNTER — Ambulatory Visit
Admission: EM | Admit: 2019-07-27 | Discharge: 2019-07-27 | Disposition: A | Discharge status: Home / Self Care | Payer: Medicaid Other | Attending: Physician Assistant | Admitting: Physician Assistant

## 2019-07-27 DIAGNOSIS — J029 Acute pharyngitis, unspecified: Secondary | ICD-10-CM | POA: Diagnosis not present

## 2019-07-27 DIAGNOSIS — R05 Cough: Secondary | ICD-10-CM

## 2019-07-27 DIAGNOSIS — J3489 Other specified disorders of nose and nasal sinuses: Secondary | ICD-10-CM | POA: Diagnosis not present

## 2019-07-27 DIAGNOSIS — R059 Cough, unspecified: Secondary | ICD-10-CM

## 2019-07-27 DIAGNOSIS — Z20822 Contact with and (suspected) exposure to covid-19: Secondary | ICD-10-CM

## 2019-07-27 MED ORDER — ALBUTEROL SULFATE HFA 108 (90 BASE) MCG/ACT IN AERS: 1.0000 | INHALATION_SPRAY | Freq: Four times a day (QID) | RESPIRATORY_TRACT | 0 refills | Status: DC | PRN

## 2019-07-27 NOTE — ED Triage Notes (Signed)
Pt states her parents had a positive COVID test today, states with them every day. Pt states having a runny nose and sore throat off and on for past 2wks. Requesting COVID testing

## 2019-07-27 NOTE — ED Provider Notes (Signed)
[facility]    CSN: [id number] Arrival date & time: 07/27/19  1402      History   Chief Complaint Chief Complaint  Patient presents with  . covid testing    HPI Breanna Thomas is a 39 y.o. female.   39 year old female comes in for 2 week history of intermittent sore throat, rhinorrhea, cough. Denies fever, chills, body aches. Denies abdominal pain, nausea, vomiting, diarrhea. Denies shortness of breath, loss of taste/smell. 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      Past Medical History:  Diagnosis Date  . Anemia   . Anxiety   . Anxiety   . Panic attacks   . Vertigo     Patient Active Problem List   Diagnosis Date Noted  . Nonspecific abnormal electrocardiogram (ECG) (EKG) 02/26/2019  . Morbid obesity ([place]) 02/26/2019  . Chest discomfort 02/26/2019  . Erroneous encounter - disregard 01/25/2019  . GERD (gastroesophageal reflux disease) 04/20/2016  . Cough 04/20/2016  . IFG (impaired fasting glucose) 12/30/2013  . Unspecified vitamin D deficiency 12/30/2013  . Back muscle spasm 12/15/2013  . Anxiety 04/10/2013    Past Surgical History:  Procedure Laterality Date  . NO PAST SURGERIES      OB History    Gravida  3   Para  3   Term      Preterm      AB      Living        SAB      TAB      Ectopic      Multiple      Live Births               Home Medications    Prior to Admission medications   Medication Sig Start Date End Date Taking? Authorizing Provider  acetaminophen (TYLENOL) 500 MG tablet Take 1,000 mg by mouth every 6 (six) hours as needed for moderate pain.    [provider]  albuterol (VENTOLIN HFA) 108 (90 Base) MCG/ACT inhaler Inhale 1-2 puffs into the lungs every 6 (six) hours as needed for wheezing or shortness of breath. 07/27/19   Ok Edwards, PA-C  etonogestrel (IMPLANON) 68 MG IMPL implant Inject 1 each into the skin once.    [provider]    Multiple Vitamin (MULTIVITAMIN) tablet Take 1 tablet by mouth daily.    [provider]  Multiple Vitamins-Minerals (AIRBORNE GUMMIES PO) Take 3 tablets by mouth daily.    [provider]  sertraline (ZOLOFT) 100 MG tablet Take one pill once per day 02/13/19   Antony Blackbird, MD    Family History Family History  Adopted: Yes  Problem Relation Age of Onset  . Diabetes Mother   . Hypertension Mother   . Heart disease Father   . Seizures Father   . Seizures Other   . Heart failure Other   . Diabetes Sister     Social History Social History   Tobacco Use  . Smoking status: Never Smoker  . Smokeless tobacco: Never Used  Substance Use Topics  . Alcohol use: Not Currently    Alcohol/week: 1.0 standard drinks    Types: 1 Glasses of wine per week    Comment: social  . Drug use: No     Allergies   Patient has no known allergies.   Review of Systems Review of Systems  Reason unable to perform ROS: See HPI as  above.     Physical Exam Triage Vital Signs ED Triage Vitals [07/27/19 1455]  Enc Vitals Group     BP 124/84     Pulse Rate 90     Resp 18     Temp 98.2 F (36.8 C)     Temp Source Oral     SpO2 98 %     Weight      Height      Head Circumference      Peak Flow      Pain Score 0     Pain Loc      Pain Edu?      Excl. in [place]?    No data found.  Updated Vital Signs BP 124/84 (BP Location: Left Arm)   Pulse 90   Temp 98.2 F (36.8 C) (Oral)   Resp 18   SpO2 98%   Physical Exam Constitutional:      General: She is not in acute distress.    Appearance: Normal appearance. She is not ill-appearing, toxic-appearing or diaphoretic.  HENT:     Head: Normocephalic and atraumatic.     Mouth/Throat:     Mouth: Mucous membranes are moist.     Pharynx: Oropharynx is clear. Uvula midline.  Cardiovascular:     Rate and Rhythm: Normal rate and regular rhythm.     Heart sounds: Normal heart sounds. No murmur. No friction rub. No gallop.    Pulmonary:     Effort: Pulmonary effort is normal. No accessory muscle usage, prolonged expiration, respiratory distress or retractions.     Comments: Lungs clear to auscultation without adventitious lung sounds. Musculoskeletal:     Cervical back: Normal range of motion and neck supple.  Neurological:     General: No focal deficit present.     Mental Status: She is alert and oriented to person, place, and time.      UC Treatments / Results  Labs (all labs ordered are listed, but only abnormal results are displayed) Labs Reviewed  NOVEL CORONAVIRUS, NAA    EKG   Radiology No results found.  Procedures Procedures (including critical care time)  Medications Ordered in UC Medications - No data to display  Initial Impression / Assessment and Plan / UC Course  I have reviewed the triage vital signs and the nursing notes.  Pertinent labs & imaging results that were available during my care of the patient were reviewed by me and considered in my medical decision making (see chart for details).    COVID PCR test ordered. However, given nature of exposure, to quarantine for 14 days regardless of testing results. No alarming signs on exam.  Patient speaking in full sentences without respiratory distress.  Symptomatic treatment discussed.  Push fluids.  Return precautions given.  Patient expresses understanding and agrees to plan.  Final Clinical Impressions(s) / UC Diagnoses   Final diagnoses:  Exposure to COVID-19 virus  Rhinorrhea  Sore throat  Cough   ED Prescriptions    Medication Sig Dispense Auth. Provider   albuterol (VENTOLIN HFA) 108 (90 Base) MCG/ACT inhaler Inhale 1-2 puffs into the lungs every 6 (six) hours as needed for wheezing or shortness of breath. 8 g Ok Edwards, PA-C     PDMP not reviewed this encounter.   Ok Edwards, PA-C 07/27/19 1526

## 2019-07-27 NOTE — Discharge Instructions (Signed)
COVID PCR testing ordered. As discussed, given your exposure, I would like you to quarantine for 14 days regardless of results. You can take over the counter flonase/nasacort to help with nasal congestion/drainage. If experiencing shortness of breath, trouble breathing, go to the emergency department for further evaluation needed.   To make appointment: You can text "COVID" to [phone] OR log on to [email or web address] If no smart phone or PC, you can call [phone] for assistance in setting up appointments.  COVID drive through sites:  [place]: [place]: [place]: [place]

## 2019-07-29 ENCOUNTER — Other Ambulatory Visit: Payer: Self-pay

## 2019-07-29 ENCOUNTER — Ambulatory Visit: Payer: Medicaid Other | Attending: Family Medicine | Admitting: Family Medicine

## 2019-07-29 DIAGNOSIS — Z8049 Family history of malignant neoplasm of other genital organs: Secondary | ICD-10-CM

## 2019-07-29 LAB — NOVEL CORONAVIRUS, NAA: SARS-CoV-2, NAA: NOT DETECTED

## 2019-07-29 NOTE — Progress Notes (Signed)
Virtual Visit via Telephone Note  I connected with Breanna Thomas on 07/29/19 at  3:10 PM EST by telephone and verified that I am speaking with the correct person using two identifiers.   I discussed the limitations, risks, security and privacy concerns of performing an evaluation and management service by telephone and the availability of in person appointments. I also discussed with the patient that there may be a patient responsible charge related to this service. The patient expressed understanding and agreed to proceed.  Patient Location: Home Provider Location: [facility] Others participating in call: none   History of Present Illness:       39 year old female who has questions regarding the HPV vaccine as she reports a family history of cervical cancer.  She states that she was adopted but recently found out that her birth mother had cervical cancer.  She wonders if she is eligible for the HPV vaccine.  She reports no past history of abnormal Pap smears and no prior positive HPV status.  She does have an upcoming appointment with her OB/GYN, Dr. Deatra Ina.  She denies any current issues with pelvic or vaginal pain.  No lower abdominal pain.  No issues with her menses.  She currently has Nexplanon for prevention of pregnancy.   Past Medical History:  Diagnosis Date  . Anemia   . Anxiety   . Anxiety   . Panic attacks   . Vertigo     Past Surgical History:  Procedure Laterality Date  . NO PAST SURGERIES      Family History  Adopted: Yes  Problem Relation Age of Onset  . Diabetes Mother   . Hypertension Mother   . Heart disease Father   . Seizures Father   . Seizures Other   . Heart failure Other   . Diabetes Sister     Social History   Tobacco Use  . Smoking status: Never Smoker  . Smokeless tobacco: Never Used  Substance Use Topics  . Alcohol use: Not Currently    Alcohol/week: 1.0 standard drinks    Types: 1 Glasses of wine per week    Comment: social  . Drug  use: No     No Known Allergies     Observations/Objective: No vital signs or physical exam conducted as visit was done via telephone  Assessment and Plan: 1. Family history of cervical cancer On review of chart, patient's last Pap smear was done in 2017 by her GYN, Dr. Deatra Ina and Pap smear at that time was normal and no mention of HPV.  While on the phone with the patient, I reviewed information on the HPV vaccine on Up to Date and currently it does not mention that the HPV vaccine has been approved for the patient's age range.  I discussed with the patient that while there is a possibility that the vaccine may now be eligible for her age range, I am not sure that the vaccine has approval for her age range and therefore advised the patient to check with her insurance company regarding coverage of the HPV vaccine and also discussed HPV vaccination guidelines and coverage with her gynecologist at her upcoming appointment.  Follow Up Instructions:Return for keep scheduled follow-up here and with GYN.    I discussed the assessment and treatment plan with the patient. The patient was provided an opportunity to ask questions and all were answered. The patient agreed with the plan and demonstrated an understanding of the instructions.   The patient was  advised to call back or seek an in-person evaluation if the symptoms worsen or if the condition fails to improve as anticipated.  I provided 6 minutes of non-face-to-face time during this encounter.   Antony Blackbird, MD

## 2019-07-29 NOTE — Progress Notes (Signed)
Pt is wanting to discuss the HPV Vaccine

## 2019-07-30 ENCOUNTER — Telehealth: Payer: Self-pay | Admitting: Emergency Medicine

## 2019-07-30 NOTE — Telephone Encounter (Signed)
Patient called for COVID result.  Verified identity using two identifiers.  Provided negative COVID result.  Pt verbalized understanding.

## 2019-08-21 ENCOUNTER — Other Ambulatory Visit: Payer: Self-pay

## 2019-08-21 ENCOUNTER — Encounter (HOSPITAL_COMMUNITY): Payer: Self-pay

## 2019-08-21 ENCOUNTER — Ambulatory Visit (HOSPITAL_COMMUNITY)
Admission: EM | Admit: 2019-08-21 | Discharge: 2019-08-21 | Disposition: A | Discharge status: Home / Self Care | Payer: Medicaid Other | Attending: Emergency Medicine | Admitting: Emergency Medicine

## 2019-08-21 DIAGNOSIS — J069 Acute upper respiratory infection, unspecified: Secondary | ICD-10-CM | POA: Diagnosis not present

## 2019-08-21 DIAGNOSIS — R0981 Nasal congestion: Secondary | ICD-10-CM | POA: Insufficient documentation

## 2019-08-21 DIAGNOSIS — Z20822 Contact with and (suspected) exposure to covid-19: Secondary | ICD-10-CM | POA: Diagnosis not present

## 2019-08-21 NOTE — ED Triage Notes (Signed)
Patient presents to Urgent Care with complaints of runny nose, sore throat, and weakness since four days ago. Patient reports her son just tested positive for covid and he lives in the house with her, she would like to be tested as well.

## 2019-08-21 NOTE — Discharge Instructions (Signed)
Self isolate.  Will notify you by phone of any positive findings. Your negative results will be sent through your MyChart.     Push fluids to ensure adequate hydration and keep secretions thin.  Tylenol and/or ibuprofen as needed for pain or fevers.  Over the counter medications as needed for symptoms.  You may try some vitamins to help your immune system potentially:  Vitamin C 500mg  twice a day. Zinc 50mg  daily. Vitamin D 5000IU daily.   Please seek further evaluation if significant shortness of breath , chest pain , difficulty breathing.

## 2019-08-21 NOTE — ED Provider Notes (Signed)
[facility]    CSN: [id number] Arrival date & time: 08/21/19  1302      History   Chief Complaint Chief Complaint  Patient presents with  . COVID Exposure  . Nasal Congestion    HPI MAXIE COLVERT is a 39 y.o. female.   Rashon E Canipe presents with complaints of intermittent nasal congestion and sore throat. This has been intermittent for a few weeks now. Her son however tested positive for covid-19 on 1/25. They live together. She has not had fevers or gi symptoms. He has started to feel better. Her parents had it two weeks ago, she had tested negative following exposure to them.     ROS per HPI, negative if not otherwise mentioned.      Past Medical History:  Diagnosis Date  . Anemia   . Anxiety   . Anxiety   . Panic attacks   . Vertigo     Patient Active Problem List   Diagnosis Date Noted  . Nonspecific abnormal electrocardiogram (ECG) (EKG) 02/26/2019  . Morbid obesity ([place]) 02/26/2019  . Chest discomfort 02/26/2019  . Erroneous encounter - disregard 01/25/2019  . GERD (gastroesophageal reflux disease) 04/20/2016  . Cough 04/20/2016  . IFG (impaired fasting glucose) 12/30/2013  . Unspecified vitamin D deficiency 12/30/2013  . Back muscle spasm 12/15/2013  . Anxiety 04/10/2013    Past Surgical History:  Procedure Laterality Date  . NO PAST SURGERIES      OB History    Gravida  3   Para  3   Term      Preterm      AB      Living        SAB      TAB      Ectopic      Multiple      Live Births               Home Medications    Prior to Admission medications   Medication Sig Start Date End Date Taking? Authorizing Provider  sertraline (ZOLOFT) 100 MG tablet Take one pill once per day 02/13/19  Yes Fulp, Cammie, MD  acetaminophen (TYLENOL) 500 MG tablet Take 1,000 mg by mouth every 6 (six) hours as needed for moderate pain.    [provider]  albuterol (VENTOLIN HFA) 108 (90 Base) MCG/ACT inhaler  Inhale 1-2 puffs into the lungs every 6 (six) hours as needed for wheezing or shortness of breath. 07/27/19   Ok Edwards, PA-C  etonogestrel (IMPLANON) 68 MG IMPL implant Inject 1 each into the skin once.    [provider]  Multiple Vitamin (MULTIVITAMIN) tablet Take 1 tablet by mouth daily.    [provider]  Multiple Vitamins-Minerals (AIRBORNE GUMMIES PO) Take 3 tablets by mouth daily.    [provider]    Family History Family History  Adopted: Yes  Problem Relation Age of Onset  . Diabetes Mother   . Hypertension Mother   . Heart disease Father   . Seizures Father   . Seizures Other   . Heart failure Other   . Diabetes Sister     Social History Social History   Tobacco Use  . Smoking status: Never Smoker  . Smokeless tobacco: Never Used  Substance Use Topics  . Alcohol use: Not Currently    Alcohol/week: 1.0 standard drinks    Types: 1 Glasses of wine per week    Comment: social  . Drug use:  No     Allergies   Patient has no known allergies.   Review of Systems Review of Systems   Physical Exam Triage Vital Signs ED Triage Vitals  Enc Vitals Group     BP 08/21/19 1425 131/88     Pulse Rate 08/21/19 1425 84     Resp 08/21/19 1425 18     Temp 08/21/19 1425 98.1 F (36.7 C)     Temp Source 08/21/19 1425 Oral     SpO2 08/21/19 1425 100 %     Weight --      Height --      Head Circumference --      Peak Flow --      Pain Score 08/21/19 1423 0     Pain Loc --      Pain Edu? --      Excl. in [place]? --    No data found.  Updated Vital Signs BP 131/88 (BP Location: Left Arm)   Pulse 84   Temp 98.1 F (36.7 C) (Oral)   Resp 18   SpO2 100%    Physical Exam Constitutional:      General: She is not in acute distress.    Appearance: She is well-developed.  Cardiovascular:     Rate and Rhythm: Normal rate.  Pulmonary:     Effort: Pulmonary effort is normal.  Skin:    General: Skin is warm and dry.  Neurological:      Mental Status: She is alert and oriented to person, place, and time.      UC Treatments / Results  Labs (all labs ordered are listed, but only abnormal results are displayed) Labs Reviewed  NOVEL CORONAVIRUS, NAA (HOSP ORDER, SEND-OUT TO REF LAB; TAT 18-24 HRS)    EKG   Radiology No results found.  Procedures Procedures (including critical care time)  Medications Ordered in UC Medications - No data to display  Initial Impression / Assessment and Plan / UC Course  I have reviewed the triage vital signs and the nursing notes.  Pertinent labs & imaging results that were available during my care of the patient were reviewed by me and considered in my medical decision making (see chart for details).     Non toxic. Benign physical exam. covid testing pending s/p exposure in the home. Isolation recommended. Return precautions provided. Patient verbalized understanding and agreeable to plan.  Ambulatory out of clinic without difficulty.    Final Clinical Impressions(s) / UC Diagnoses   Final diagnoses:  Upper respiratory tract infection, unspecified type  Exposure to COVID-19 virus     Discharge Instructions     Self isolate.  Will notify you by phone of any positive findings. Your negative results will be sent through your MyChart.     Push fluids to ensure adequate hydration and keep secretions thin.  Tylenol and/or ibuprofen as needed for pain or fevers.  Over the counter medications as needed for symptoms.  You may try some vitamins to help your immune system potentially:  Vitamin C 500mg  twice a day. Zinc 50mg  daily. Vitamin D 5000IU daily.   Please seek further evaluation if significant shortness of breath , chest pain , difficulty breathing.     ED Prescriptions    None     PDMP not reviewed this encounter.   Zigmund Gottron, NP 08/21/19 1457

## 2019-08-23 LAB — NOVEL CORONAVIRUS, NAA (HOSP ORDER, SEND-OUT TO REF LAB; TAT 18-24 HRS): SARS-CoV-2, NAA: NOT DETECTED

## 2019-08-27 ENCOUNTER — Encounter: Payer: Medicaid Other | Admitting: Family Medicine

## 2019-08-30 ENCOUNTER — Encounter: Payer: Self-pay | Admitting: Family Medicine

## 2019-09-08 ENCOUNTER — Ambulatory Visit: Payer: Medicaid Other | Admitting: Cardiology

## 2019-09-25 ENCOUNTER — Telehealth: Payer: Self-pay | Admitting: Family Medicine

## 2019-09-25 ENCOUNTER — Other Ambulatory Visit: Payer: Self-pay | Admitting: Family Medicine

## 2019-09-25 DIAGNOSIS — F418 Other specified anxiety disorders: Secondary | ICD-10-CM

## 2019-09-25 MED ORDER — SERTRALINE HCL 100 MG PO TABS: ORAL_TABLET | ORAL | 1 refills | Status: DC

## 2019-09-25 NOTE — Telephone Encounter (Signed)
Transferred call from front desk / DOB and name verified / stated she has been out of Zolof meds for the past 3 days / requested refills to be sent to [facility] /

## 2019-09-25 NOTE — Progress Notes (Signed)
Patient ID: Breanna Thomas, female   DOB: Jan 19, 1981, 39 y.o.   MRN: [id number]   Phone message received that patient needs refill of sertraline as she has been out for the past 3 days.  New prescription will be sent to her pharmacy for 30-day supply with 1 refill and she will be asked to make follow-up appointment.

## 2019-09-25 NOTE — Telephone Encounter (Signed)
1) Medication(s) Requested (by name):sertraline (ZOLOFT) 100 MG tablet [id number]   2) [facility], [address]  [place], [place] [address]   3) Special Requests:   Approved medications will be sent to the pharmacy, we will reach out if there is an issue.  Requests made after 3pm may not be addressed until the following business day!  If a patient is unsure of the name of the medication(s) please note and ask patient to call back when they are able to provide all info, do not send to responsible party until all information is available!

## 2019-09-25 NOTE — Telephone Encounter (Signed)
Please notify patient that 30-day supply of Zoloft was sent to her pharmacy with 1 refill.  Please have patient schedule follow-up visit before running out of medication or in 6 to 7 weeks.

## 2019-09-25 NOTE — Telephone Encounter (Signed)
Name and date verified. Called pt /Made aware of pending prescription.

## 2019-10-01 DIAGNOSIS — Z1151 Encounter for screening for human papillomavirus (HPV): Secondary | ICD-10-CM | POA: Diagnosis not present

## 2019-10-01 DIAGNOSIS — Z0000 Encounter for general adult medical examination without abnormal findings: Secondary | ICD-10-CM | POA: Diagnosis not present

## 2019-10-11 IMAGING — DX CHEST - 2 VIEW
2 series · 2 of 2 positions shown · non-contrast
Comparison: Chest x-ray dated May 08, 2018.

CLINICAL DATA: Chest pain and pressure for the past month.

EXAM:
CHEST - 2 VIEW

[chest pa]
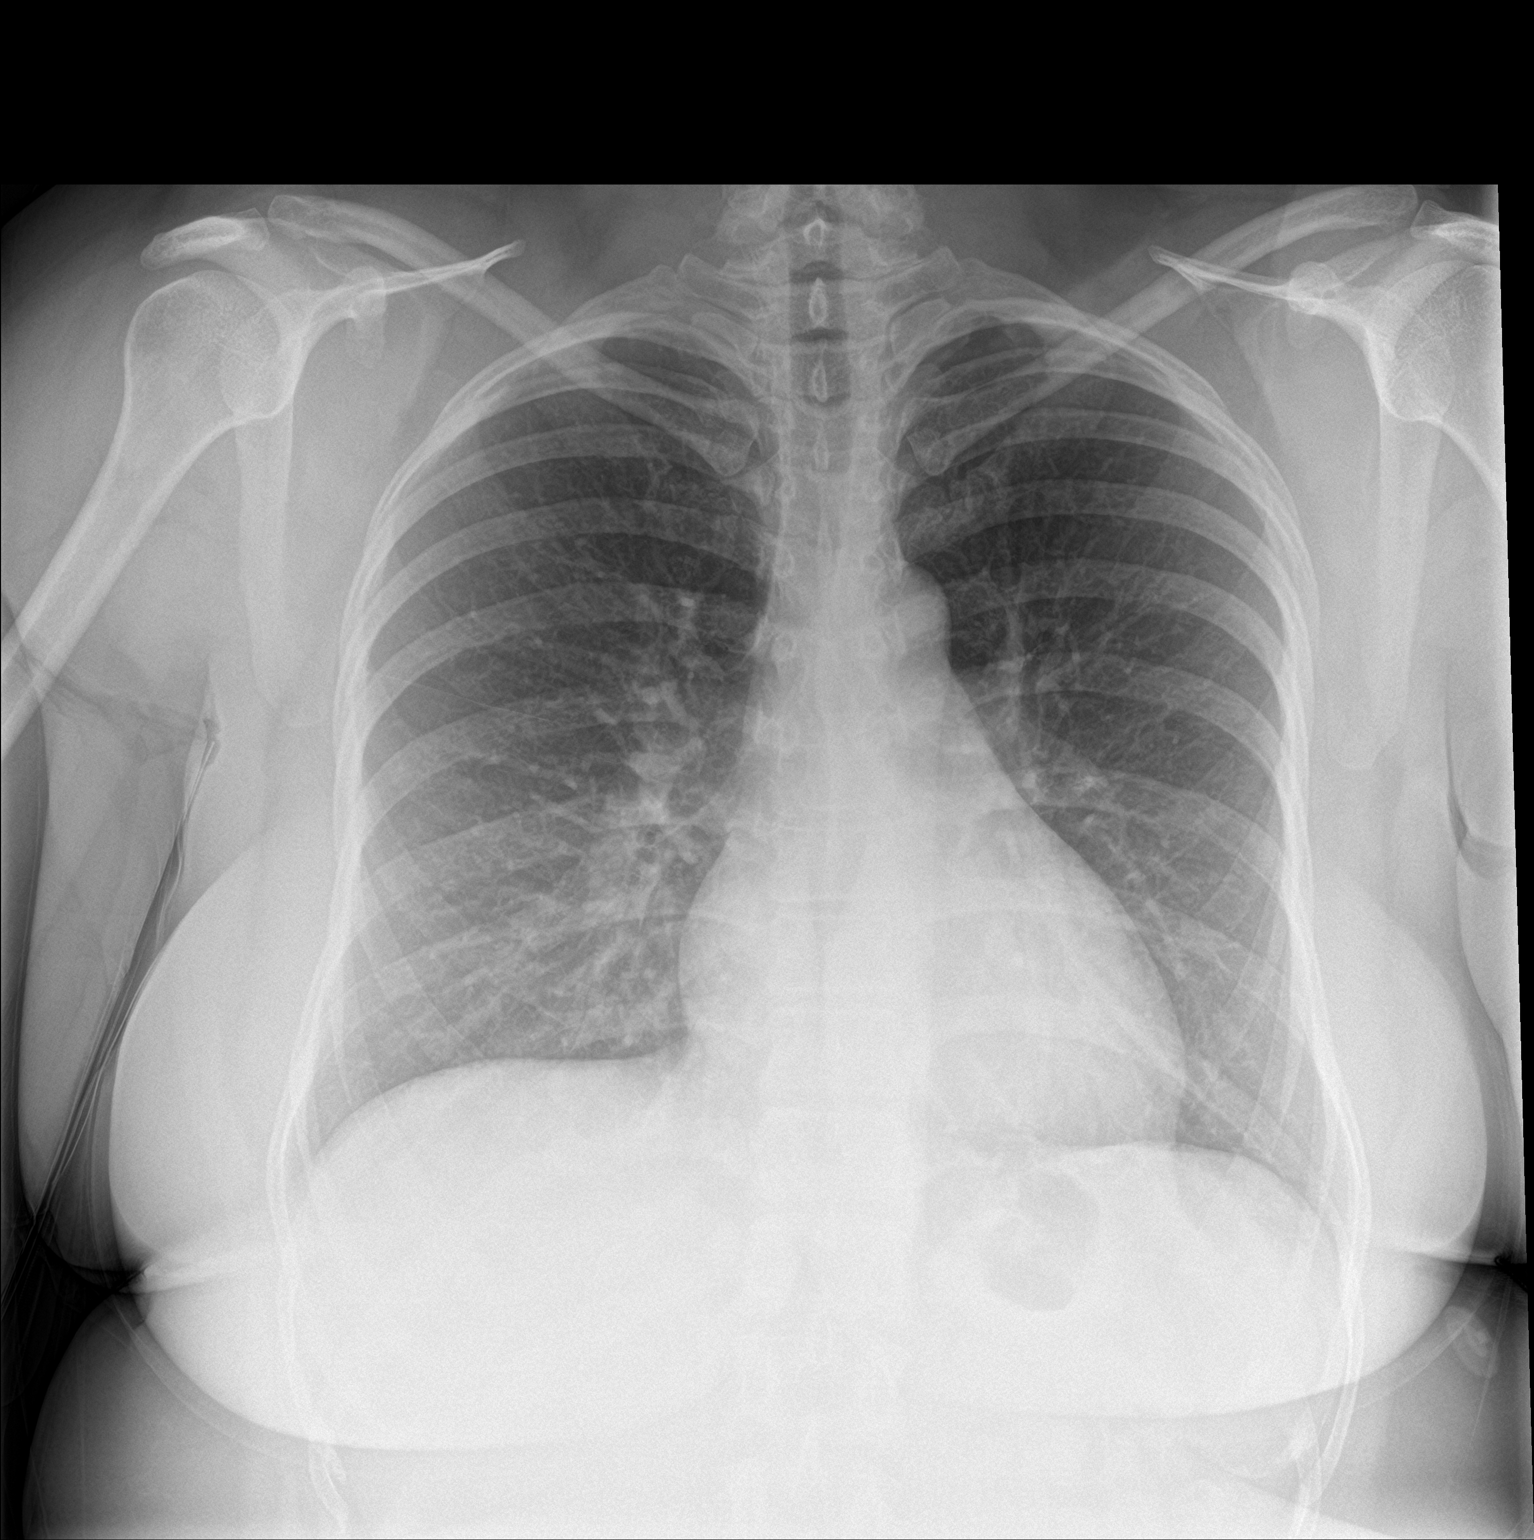

[chest lat]
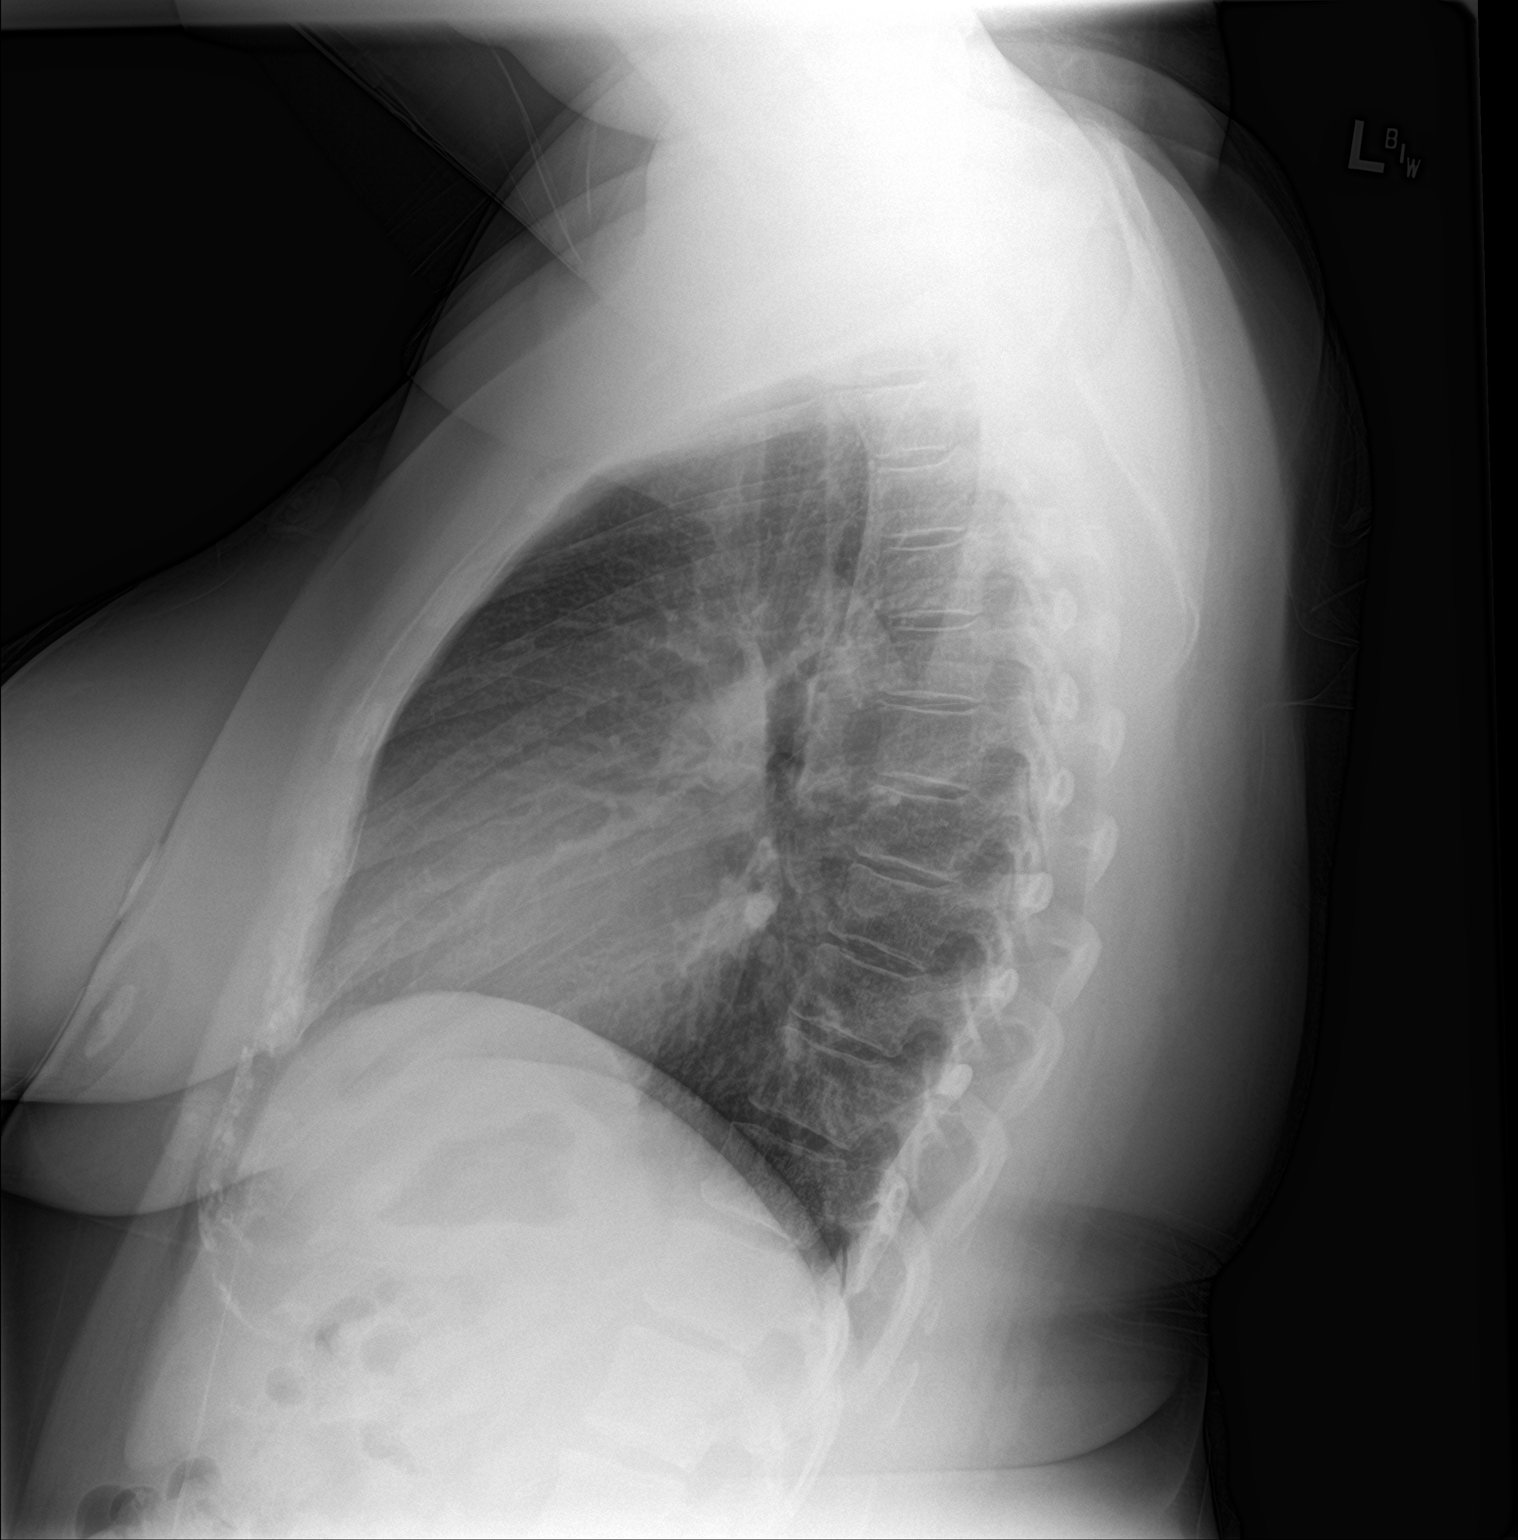

[2 of 2 positions shown; findings below may reference images not displayed]

FINDINGS: The heart size and mediastinal contours are within normal limits.
Both lungs are clear. The visualized skeletal structures are
unremarkable.
IMPRESSION: No active cardiopulmonary disease.

## 2019-11-06 ENCOUNTER — Ambulatory Visit: Payer: Medicaid Other | Admitting: Family Medicine

## 2019-11-12 IMAGING — US ULTRASOUND ABDOMEN LIMITED
1 series · 14 of 25 positions shown · non-contrast
Comparison: None.

CLINICAL DATA: Upper abdominal pain

EXAM:
ULTRASOUND ABDOMEN LIMITED RIGHT UPPER QUADRANT

[Series 1: ultrasound abdomen limited · 14 of 58 slices shown]
[im 1/58]
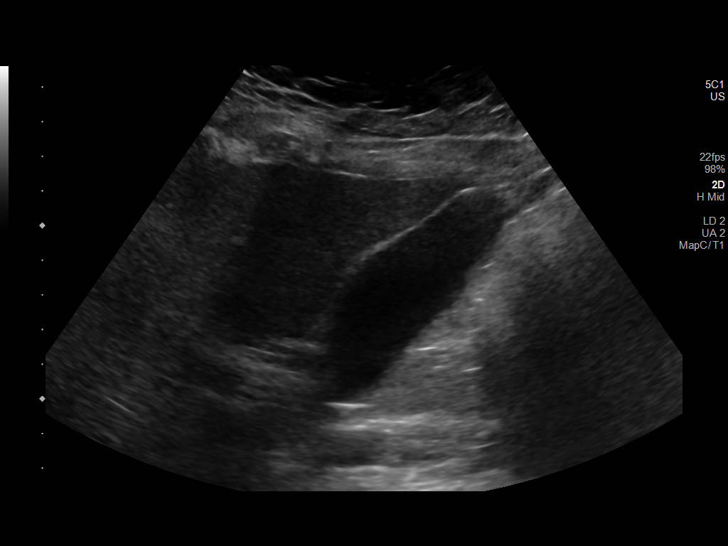
[im 5/58]
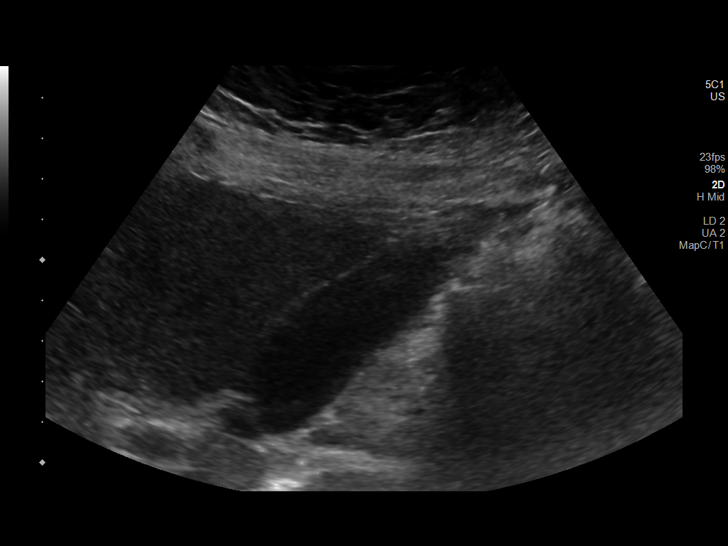
[im 10/58]
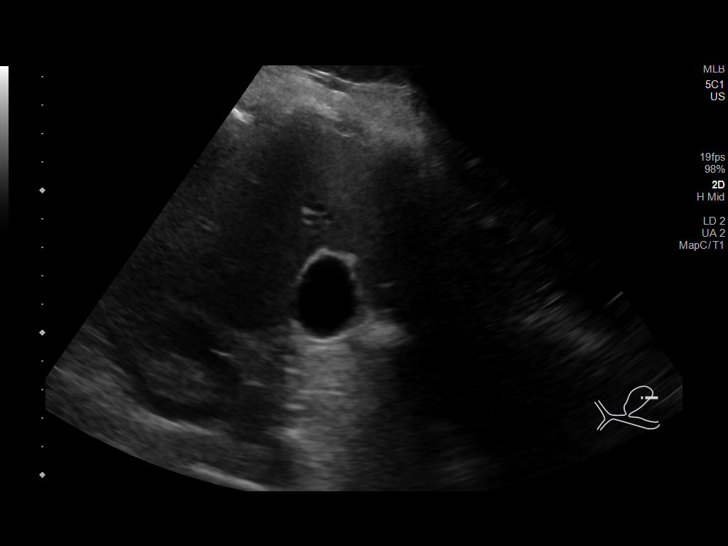
[im 15/58]
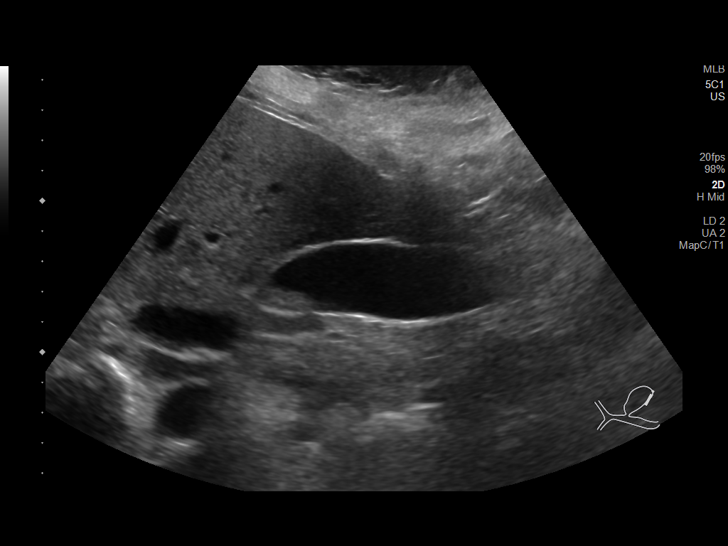
[im 20/58]
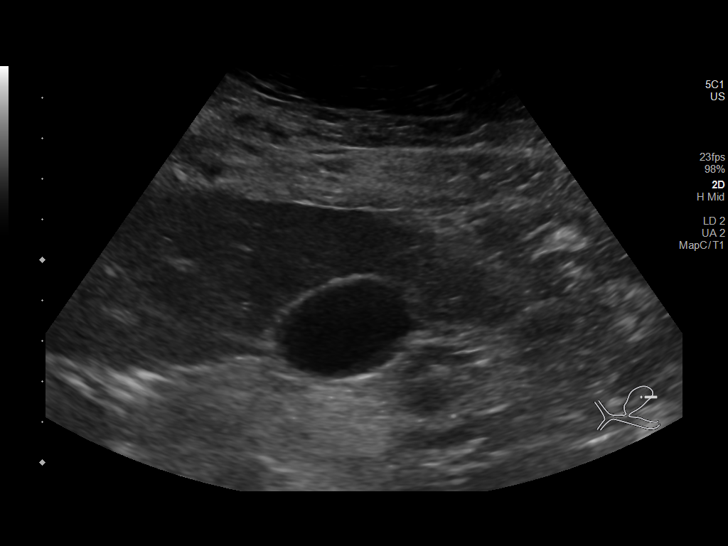
[im 22/58]
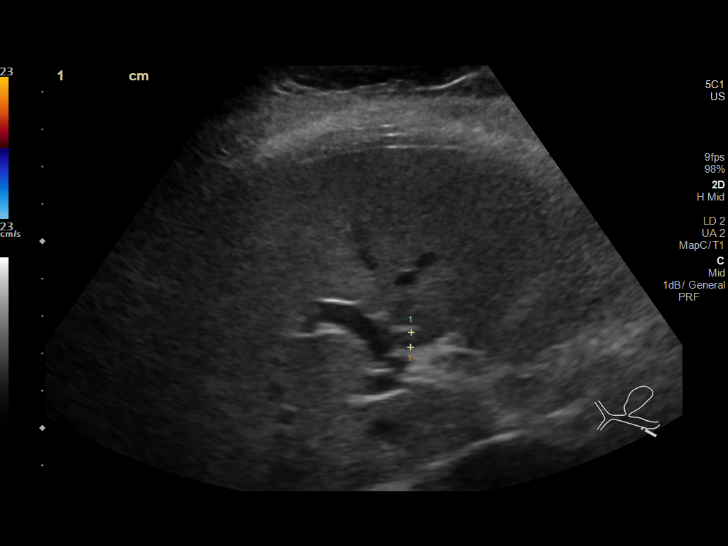
[im 27/58]
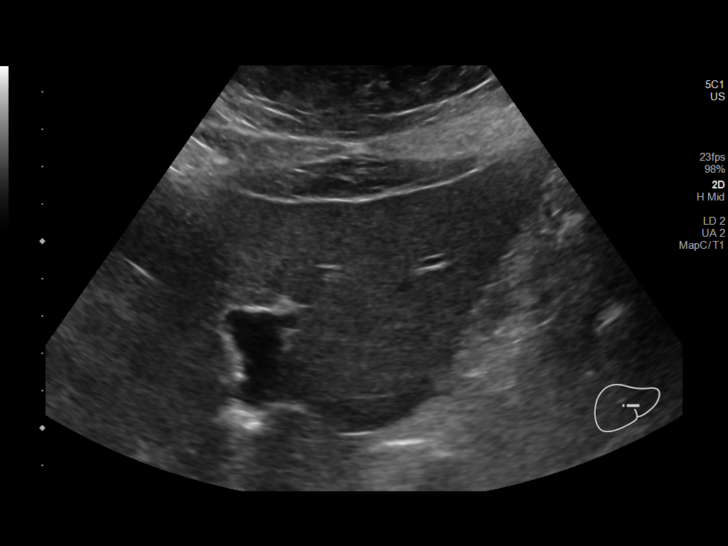
[im 31/58]
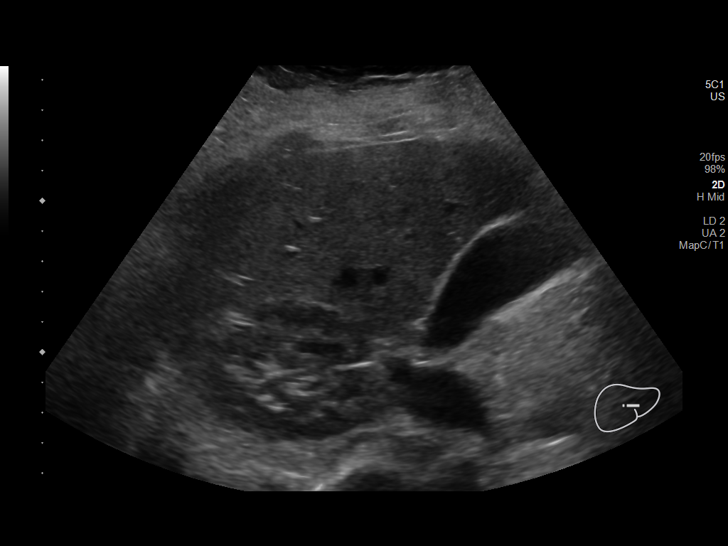
[im 36/58]
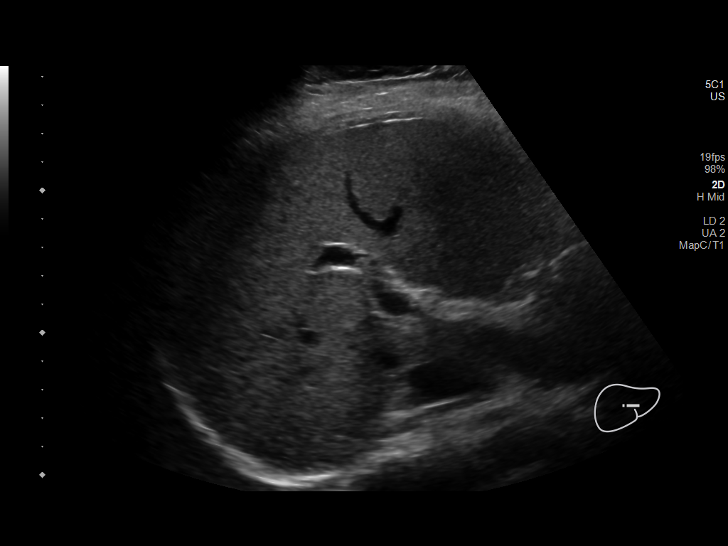
[im 39/58]
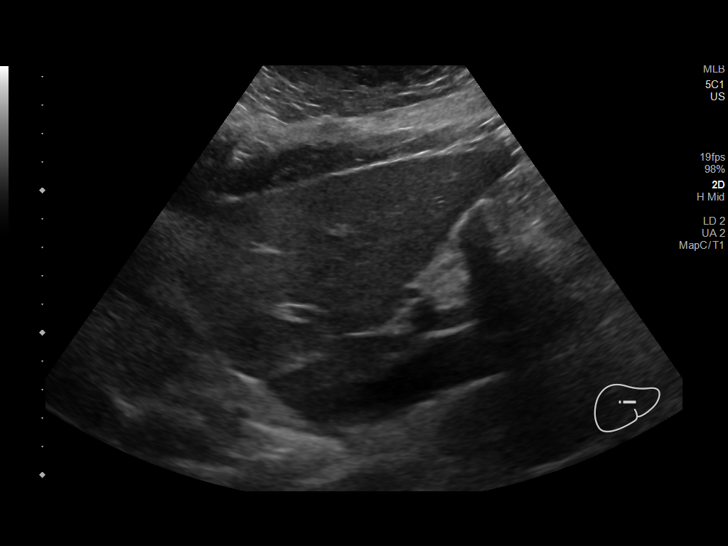
[im 43/58]
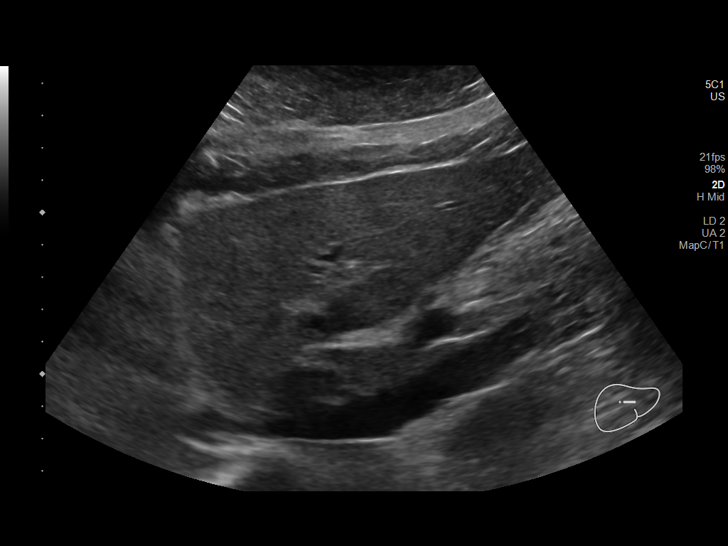
[im 48/58]
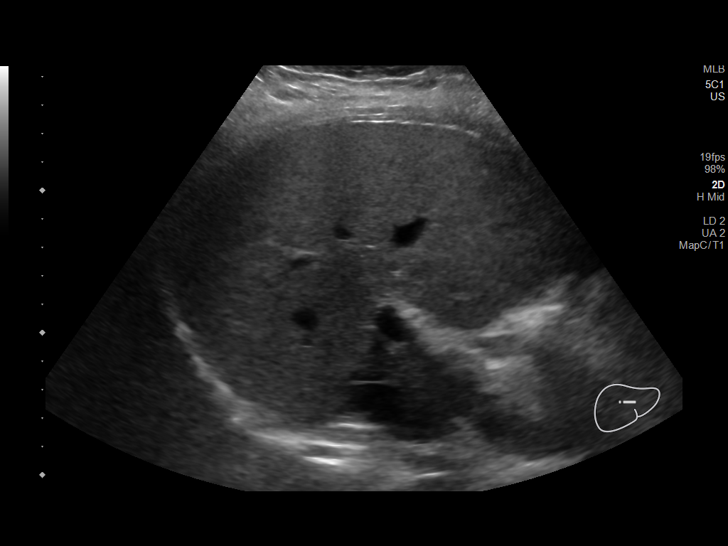
[im 53/58]
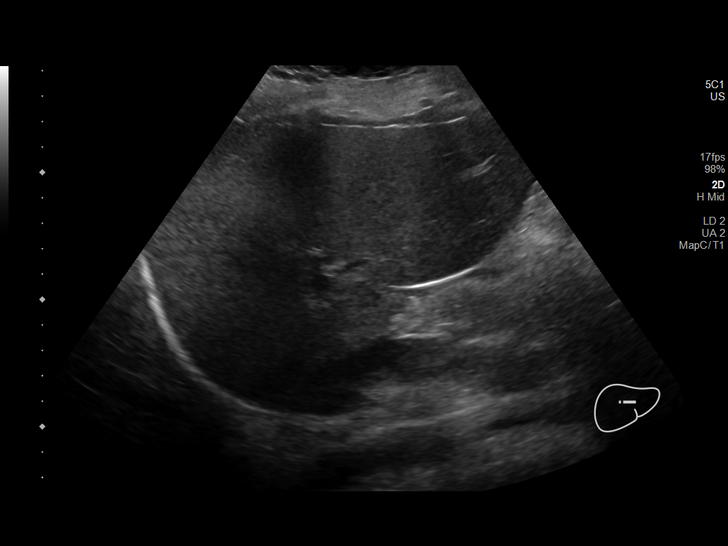
[im 58/58]
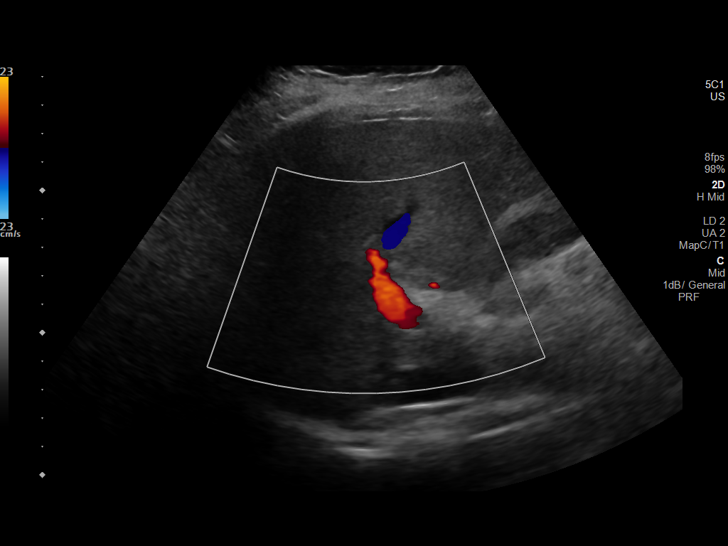

[14 of 25 positions shown; findings below may reference images not displayed]

FINDINGS: Gallbladder:

No gallstones or wall thickening visualized. There is no
pericholecystic fluid. No sonographic Murphy sign noted by
sonographer.

Common bile duct:

Diameter: 2 mm. No intrahepatic or extrahepatic biliary duct
dilatation.

Liver:

No focal lesion identified. Within normal limits in parenchymal
echogenicity. Portal vein is patent on color Doppler imaging with
normal direction of blood flow towards the liver.

Other: None.
IMPRESSION: Study within normal limits.

## 2019-11-23 DIAGNOSIS — Z3046 Encounter for surveillance of implantable subdermal contraceptive: Secondary | ICD-10-CM | POA: Diagnosis not present

## 2019-12-02 ENCOUNTER — Ambulatory Visit: Payer: Medicaid Other | Attending: Family Medicine | Admitting: Family Medicine

## 2019-12-02 ENCOUNTER — Encounter: Payer: Self-pay | Admitting: Family Medicine

## 2019-12-02 ENCOUNTER — Other Ambulatory Visit: Payer: Self-pay

## 2019-12-02 VITALS — BP 138/83 | HR 76 | Temp 97.7°F | Wt 280.8 lb

## 2019-12-02 DIAGNOSIS — F411 Generalized anxiety disorder: Secondary | ICD-10-CM | POA: Diagnosis present

## 2019-12-02 DIAGNOSIS — Z6841 Body Mass Index (BMI) 40.0 and over, adult: Secondary | ICD-10-CM | POA: Insufficient documentation

## 2019-12-02 DIAGNOSIS — Z79899 Other long term (current) drug therapy: Secondary | ICD-10-CM | POA: Diagnosis not present

## 2019-12-02 MED ORDER — SERTRALINE HCL 100 MG PO TABS: ORAL_TABLET | ORAL | 1 refills | Status: DC

## 2019-12-02 NOTE — Progress Notes (Signed)
Med refills  Nutritionist referral   Talk about her GAD-7  280.8lb

## 2019-12-02 NOTE — Patient Instructions (Signed)

## 2019-12-02 NOTE — Progress Notes (Signed)
Established Patient Office Visit  Subjective:  Patient ID: Breanna Thomas, female    DOB: October 04, 1980  Age: 39 y.o. MRN: [id number]  CC: follow-up of anxiety and concerns about weight- C. Tajuan Dufault, MD  HPI [name], 39 yo female, seen in follow-up of anxiety and she reports that since being on sertraline, she has not had any additional panic attacks yet she continues to constantly worry. She denies any thoughts of harming herself or any one else. She is also interested in a weight loss program. She feels that she has been stress eating due to her anxiety. She is not interested in weight loss surgery as her sister passed away after weight loss surgery.   Past Medical History:  Diagnosis Date  . Anemia   . Anxiety   . Anxiety   . Panic attacks   . Vertigo     Past Surgical History:  Procedure Laterality Date  . NO PAST SURGERIES      Family History  Adopted: Yes  Problem Relation Age of Onset  . Diabetes Mother   . Hypertension Mother   . Heart disease Father   . Seizures Father   . Seizures Other   . Heart failure Other   . Diabetes Sister     Social History   Socioeconomic History  . Marital status: Divorced    Spouse name: Not on file  . Number of children: 3  . Years of education: College  . Highest education level: Not on file  Occupational History    Employer: [employer]    Comment: [place]  Tobacco Use  . Smoking status: Never Smoker  . Smokeless tobacco: Never Used  Substance and Sexual Activity  . Alcohol use: Not Currently    Alcohol/week: 1.0 standard drinks    Types: 1 Glasses of wine per week    Comment: social  . Drug use: No  . Sexual activity: Not Currently    Birth control/protection: Implant  Other Topics Concern  . Not on file  Social History Narrative   Patient lives at home alone.   Caffeine Use: none in the past 2 weeks, soda occasionally   Social Determinants of Health   Financial Resource Strain:   .  Difficulty of Paying Living Expenses:   Food Insecurity:   . Worried About Charity fundraiser in the Last Year:   . Arboriculturist in the Last Year:   Transportation Needs:   . Film/video editor (Medical):   Marland Kitchen Lack of Transportation (Non-Medical):   Physical Activity:   . Days of Exercise per Week:   . Minutes of Exercise per Session:   Stress:   . Feeling of Stress :   Social Connections:   . Frequency of Communication with Friends and Family:   . Frequency of Social Gatherings with Friends and Family:   . Attends Religious Services:   . Active Member of Clubs or Organizations:   . Attends Archivist Meetings:   Marland Kitchen Marital Status:   Intimate Partner Violence:   . Fear of Current or Ex-Partner:   . Emotionally Abused:   Marland Kitchen Physically Abused:   . Sexually Abused:     Outpatient Medications Prior to Visit  Medication Sig Dispense Refill  . albuterol (VENTOLIN HFA) 108 (90 Base) MCG/ACT inhaler Inhale 1-2 puffs into the lungs every 6 (six) hours as needed for wheezing or shortness of breath. 8 g 0  . Multiple Vitamins-Minerals (AIRBORNE GUMMIES  PO) Take 3 tablets by mouth daily.    . sertraline (ZOLOFT) 100 MG tablet Take one pill once per day 30 tablet 1  . acetaminophen (TYLENOL) 500 MG tablet Take 1,000 mg by mouth every 6 (six) hours as needed for moderate pain.    Marland Kitchen etonogestrel (IMPLANON) 68 MG IMPL implant Inject 1 each into the skin once.    . Multiple Vitamin (MULTIVITAMIN) tablet Take 1 tablet by mouth daily.     No facility-administered medications prior to visit.    No Known Allergies  ROS Review of Systems  Constitutional: Positive for fatigue. Negative for chills and fever.  HENT: Negative for sore throat and trouble swallowing.   Respiratory: Negative for cough and shortness of breath.   Cardiovascular: Negative for chest pain and palpitations.  Gastrointestinal: Negative for abdominal pain, constipation, diarrhea and nausea.  Endocrine:  Negative for polydipsia, polyphagia and polyuria.  Genitourinary: Negative for dysuria and frequency.  Musculoskeletal: Negative for arthralgias and back pain.  Neurological: Negative for dizziness and headaches.  Hematological: Negative for adenopathy. Does not bruise/bleed easily.  Psychiatric/Behavioral: Negative for self-injury and suicidal ideas. The patient is nervous/anxious.       Objective:    Physical Exam  Constitutional: She is oriented to person, place, and time. She appears well-developed and well-nourished.  WNWD obese female in NAD wearing a mask as per office COVID-19 protocol  Neck: No JVD present.  Cardiovascular: Normal rate and regular rhythm.  Pulmonary/Chest: Effort normal and breath sounds normal.  Abdominal: Soft. There is no abdominal tenderness. There is no rebound and no guarding.  Lymphadenopathy:    She has no cervical adenopathy.  Neurological: She is alert and oriented to person, place, and time.  Skin: Skin is warm and dry.  Psychiatric: She has a normal mood and affect. Her behavior is normal.  Nursing note and vitals reviewed.   BP 138/83   Pulse 76   Temp 97.7 F (36.5 C) (Temporal)   Wt 280 lb 12.8 oz (127.4 kg)   LMP 10/28/2019 (Approximate)   SpO2 97%   BMI 48.20 kg/m  Wt Readings from Last 3 Encounters:  12/02/19 280 lb 12.8 oz (127.4 kg)  07/06/19 270 lb (122.5 kg)  05/18/19 270 lb (122.5 kg)     Health Maintenance Due  Topic Date Due  . HIV Screening  Never done  . COVID-19 Vaccine (1) Never done  . PAP SMEAR-Modifier  08/03/2018    Lab Results  Component Value Date   TSH 2.270 02/13/2019   Lab Results  Component Value Date   WBC 6.5 01/26/2019   HGB 12.4 01/26/2019   HCT 40.8 01/26/2019   MCV 92.5 01/26/2019   PLT 308 01/26/2019   Lab Results  Component Value Date   NA 137 01/26/2019   K 4.0 01/26/2019   CO2 22 01/26/2019   GLUCOSE 95 01/26/2019   BUN 8 01/26/2019   CREATININE 0.70 01/26/2019   BILITOT 0.4  12/15/2013   ALKPHOS 42 12/15/2013   AST 20 12/15/2013   ALT 11 12/15/2013   PROT 6.6 12/15/2013   ALBUMIN 3.9 12/15/2013   CALCIUM 8.9 01/26/2019   ANIONGAP 7 01/26/2019   No results found for: CHOL No results found for: HDL No results found for: LDLCALC No results found for: TRIG No results found for: CHOLHDL No results found for: HGBA1C    Assessment & Plan:  1. GAD (generalized anxiety disorder) Sertraline has helped patient as far as anxiety attacks are concerned  however she feels that she continues to have anxiety and excessive worry.  - Ambulatory referral to Psychiatry - sertraline (ZOLOFT) 100 MG tablet; Take one pill once per day  Dispense: 90 tablet; Refill: 1 - Ambulatory referral to Social Work  2. Morbid obesity ([place]) Referral placed for patient to attend a medical weight management program to help with weight loss - Amb Ref to Medical Weight Management   An After Visit Summary was printed and given to the patient.   Follow-up:Return in about 3 months (around 03/03/2020) for anxiety.Antony Blackbird, MD

## 2020-01-13 ENCOUNTER — Ambulatory Visit (INDEPENDENT_AMBULATORY_CARE_PROVIDER_SITE_OTHER): Payer: Self-pay | Admitting: Family Medicine

## 2020-01-14 ENCOUNTER — Ambulatory Visit (INDEPENDENT_AMBULATORY_CARE_PROVIDER_SITE_OTHER): Payer: Self-pay | Admitting: Family Medicine

## 2020-01-21 ENCOUNTER — Telehealth: Payer: Self-pay | Admitting: Licensed Clinical Social Worker

## 2020-01-21 NOTE — Telephone Encounter (Signed)
Call placed to patient regarding IBH referral. LCSW left message requesting a return call.  

## 2020-01-27 ENCOUNTER — Ambulatory Visit (INDEPENDENT_AMBULATORY_CARE_PROVIDER_SITE_OTHER): Payer: Self-pay | Admitting: Family Medicine

## 2020-02-05 ENCOUNTER — Telehealth: Payer: Self-pay | Admitting: Licensed Clinical Social Worker

## 2020-02-05 NOTE — Telephone Encounter (Signed)
Call placed to patient regarding IBH referral. LCSW left message requesting a return call.  

## 2020-02-09 NOTE — Telephone Encounter (Signed)
Patient returned call to Breanna Thomas ask that a detailed message is left if she does not answer upon call back Ph# [phone]

## 2020-02-12 ENCOUNTER — Telehealth: Payer: Self-pay | Admitting: Licensed Clinical Social Worker

## 2020-02-12 NOTE — Telephone Encounter (Signed)
Return call placed to patient regarding IBH referral. LCSW left message requesting a return call.  Should pt return call, please schedule an IBH appointment with LCSW

## 2020-03-02 ENCOUNTER — Telehealth: Payer: Self-pay | Admitting: Licensed Clinical Social Worker

## 2020-03-02 ENCOUNTER — Ambulatory Visit: Payer: Medicaid Other | Attending: Family Medicine | Admitting: Licensed Clinical Social Worker

## 2020-03-02 NOTE — Telephone Encounter (Signed)
Call placed to patient 2 x regarding scheduled IBH appointment. LCSW left detailed message requesting a call back.

## 2020-03-03 ENCOUNTER — Ambulatory Visit: Payer: Medicaid Other | Admitting: Family Medicine

## 2020-03-08 ENCOUNTER — Ambulatory Visit: Payer: Medicaid Other | Admitting: Family Medicine

## 2020-03-24 ENCOUNTER — Ambulatory Visit (INDEPENDENT_AMBULATORY_CARE_PROVIDER_SITE_OTHER): Payer: Medicaid Other | Admitting: Clinical

## 2020-03-24 ENCOUNTER — Other Ambulatory Visit: Payer: Self-pay

## 2020-03-24 DIAGNOSIS — F331 Major depressive disorder, recurrent, moderate: Secondary | ICD-10-CM | POA: Diagnosis not present
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
## 2020-04-27 ENCOUNTER — Ambulatory Visit: Payer: Medicaid Other | Attending: Family Medicine | Admitting: Family Medicine

## 2020-04-27 ENCOUNTER — Other Ambulatory Visit: Payer: Self-pay

## 2020-04-27 ENCOUNTER — Encounter: Payer: Self-pay | Admitting: Family Medicine

## 2020-04-27 VITALS — BP 126/83 | HR 90 | Ht 64.0 in | Wt 273.4 lb

## 2020-04-27 DIAGNOSIS — F411 Generalized anxiety disorder: Secondary | ICD-10-CM

## 2020-04-27 DIAGNOSIS — G4489 Other headache syndrome: Secondary | ICD-10-CM | POA: Diagnosis not present

## 2020-04-27 MED ORDER — SERTRALINE HCL 100 MG PO TABS: ORAL_TABLET | ORAL | 0 refills | Status: DC

## 2020-04-27 NOTE — Patient Instructions (Signed)
Form - Headache Record There are many types and causes of headaches. A headache record can help guide your treatment plan. Use this form to record the details. Bring this form with you to your follow-up visits. Follow your health care provider's instructions on how to describe your headache. You may be asked to:  Use a pain scale. This is a tool to rate the intensity of your headache using words or numbers.  Describe what your headache feels like, such as dull, achy, throbbing, or sharp. Headache record Date: _______________ Time (from start to end): ____________________ Location of the headache: _________________________  Intensity of the headache: ____________________ Description of the headache: ______________________________________________________________  Hours of sleep the night before the headache: __________  Food or drinks before the headache started: ______________________________________________________________________________________  Events before the headache started: _______________________________________________________________________________________________  Symptoms before the headache started: __________________________________________________________________________________________  Symptoms during the headache: __________________________________________________________________________________________________  Treatment: ________________________________________________________________________________________________________________  Effect of treatment: _________________________________________________________________________________________________________  Other comments: ___________________________________________________________________________________________________________ Date: _______________ Time (from start to end): ____________________ Location of the headache: _________________________  Intensity of the headache: ____________________ Description of the  headache: ______________________________________________________________  Hours of sleep the night before the headache: __________  Food or drinks before the headache started: ______________________________________________________________________________________  Events before the headache started: ____________________________________________________________________________________________  Symptoms before the headache started: _________________________________________________________________________________________  Symptoms during the headache: _______________________________________________________________________________________________  Treatment: ________________________________________________________________________________________________________________  Effect of treatment: _________________________________________________________________________________________________________  Other comments: ___________________________________________________________________________________________________________ Date: _______________ Time (from start to end): ____________________ Location of the headache: _________________________  Intensity of the headache: ____________________ Description of the headache: ______________________________________________________________  Hours of sleep the night before the headache: __________  Food or drinks before the headache started: ______________________________________________________________________________________  Events before the headache started: ____________________________________________________________________________________________  Symptoms before the headache started: _________________________________________________________________________________________  Symptoms during the headache: _______________________________________________________________________________________________  Treatment:  ________________________________________________________________________________________________________________  Effect of treatment: _________________________________________________________________________________________________________  Other comments: ___________________________________________________________________________________________________________ Date: _______________ Time (from start to end): ____________________ Location of the headache: _________________________  Intensity of the headache: ____________________ Description of the headache: ______________________________________________________________  Hours of sleep the night before the headache: _________  Food or drinks before the headache started: ______________________________________________________________________________________  Events before the headache started: ____________________________________________________________________________________________  Symptoms before the headache started: _________________________________________________________________________________________  Symptoms during the headache: _______________________________________________________________________________________________  Treatment: ________________________________________________________________________________________________________________  Effect of treatment: _________________________________________________________________________________________________________  Other comments: ___________________________________________________________________________________________________________ Date: _______________ Time (from start to end): ____________________ Location of the headache: _________________________  Intensity of the headache: ____________________ Description of the headache: ______________________________________________________________  Hours of sleep the night before the headache: _________  Food or drinks  before the headache started: ______________________________________________________________________________________  Events before the headache started: ____________________________________________________________________________________________  Symptoms before the headache started: _________________________________________________________________________________________  Symptoms during the headache: _______________________________________________________________________________________________  Treatment: ________________________________________________________________________________________________________________  Effect of treatment: _________________________________________________________________________________________________________  Other comments: ___________________________________________________________________________________________________________ This information is not intended to replace advice given to you by your health care provider. Make sure you discuss any questions you have with your health care provider. Document Revised: 07/28/2018 Document Reviewed: 07/28/2018 Elsevier Patient Education  2020 [employer]  

## 2020-04-27 NOTE — Progress Notes (Signed)
States that she is getting pains in the front of her fore head. Comes and goes  Needs refill on Zoloft.

## 2020-04-27 NOTE — Progress Notes (Signed)
Subjective:  Patient ID: Breanna Thomas, female    DOB: May 11, 1981  Age: 39 y.o. MRN: [id number]  CC: Anxiety   HPI Breanna Thomas is a 39 year old female patient of Dr. Chapman Fitch with a history of anxiety who presents today for refill of Zoloft for management of her anxiety. Doing well on Zoloft but states that there was a time she had been off her medication and could feel her anxiety symptoms worsen.  She also complains of pain described as a sharp pain sometimes like a scratch on the L side of her head lasting 3 secs for the last month and sometimes occurs on other parts of her head simultaneously.  This occurs daily but is absent at this time.  Denies radiation of pain.  Denies presence of neck pain, blurry vision, sinus symptoms, lightheadedness, syncope, photophobia or phonophobia. She gets 6-7 hrs/ sleep. Endorses being stressed and is always anxious.  Past Medical History:  Diagnosis Date  . Anemia   . Anxiety   . Anxiety   . Panic attacks   . Vertigo     Past Surgical History:  Procedure Laterality Date  . NO PAST SURGERIES      Family History  Adopted: Yes  Problem Relation Age of Onset  . Diabetes Mother   . Hypertension Mother   . Heart disease Father   . Seizures Father   . Seizures Other   . Heart failure Other   . Diabetes Sister     No Known Allergies  Outpatient Medications Prior to Visit  Medication Sig Dispense Refill  . acetaminophen (TYLENOL) 500 MG tablet Take 1,000 mg by mouth every 6 (six) hours as needed for moderate pain.    Marland Kitchen albuterol (VENTOLIN HFA) 108 (90 Base) MCG/ACT inhaler Inhale 1-2 puffs into the lungs every 6 (six) hours as needed for wheezing or shortness of breath. 8 g 0  . Multiple Vitamin (MULTIVITAMIN) tablet Take 1 tablet by mouth daily.    . Multiple Vitamins-Minerals (AIRBORNE GUMMIES PO) Take 3 tablets by mouth daily.    . sertraline (ZOLOFT) 100 MG tablet Take one pill once per day 90 tablet 1  . etonogestrel  (IMPLANON) 68 MG IMPL implant Inject 1 each into the skin once. (Patient not taking: Reported on 04/27/2020)     No facility-administered medications prior to visit.     ROS Review of Systems  Constitutional: Negative for activity change, appetite change and fatigue.  HENT: Negative for congestion, sinus pressure and sore throat.   Eyes: Negative for visual disturbance.  Respiratory: Negative for cough, chest tightness, shortness of breath and wheezing.   Cardiovascular: Negative for chest pain and palpitations.  Gastrointestinal: Negative for abdominal distention, abdominal pain and constipation.  Endocrine: Negative for polydipsia.  Genitourinary: Negative for dysuria and frequency.  Musculoskeletal: Negative for arthralgias and back pain.  Skin: Negative for rash.  Neurological: Positive for headaches. Negative for tremors, light-headedness and numbness.  Hematological: Does not bruise/bleed easily.  Psychiatric/Behavioral: Negative for agitation and behavioral problems.    Objective:  BP 126/83   Pulse 90   Ht 5\' 4"  (1.626 m)   Wt 273 lb 6.4 oz (124 kg)   SpO2 99%   BMI 46.93 kg/m   BP/Weight 04/27/2020 12/02/2019 01/20/1600  Systolic BP 093 235 573  Diastolic BP 83 83 88  Wt. (Lbs) 273.4 280.8 -  BMI 46.93 48.2 -      Physical Exam Constitutional:      Appearance: She is  well-developed.  Neck:     Vascular: No JVD.  Cardiovascular:     Rate and Rhythm: Normal rate.     Heart sounds: Normal heart sounds. No murmur heard.   Pulmonary:     [place]: Pulmonary effort is normal.     Breath sounds: Normal breath sounds. No wheezing or rales.  Chest:     Chest wall: No tenderness.  Abdominal:     General: Bowel sounds are normal. There is no distension.     Palpations: Abdomen is soft. There is no mass.     Tenderness: There is no abdominal tenderness.  Musculoskeletal:        General: Normal range of motion.     Right lower leg: No edema.     Left lower leg: No  edema.  Neurological:     Mental Status: She is alert and oriented to person, place, and time.  Psychiatric:        Mood and Affect: Mood normal.     CMP Latest Ref Rng & Units 01/26/2019 01/23/2019 02/26/2017  Glucose 70 - 99 mg/dL 95 90 98  BUN 6 - 20 mg/dL 8 9 9   Creatinine 0.44 - 1.00 mg/dL 0.70 0.73 0.70  Sodium 135 - 145 mmol/L 137 139 140  Potassium 3.5 - 5.1 mmol/L 4.0 3.8 4.1  Chloride 98 - 111 mmol/L 108 109 106  CO2 22 - 32 mmol/L 22 23 18(L)  Calcium 8.9 - 10.3 mg/dL 8.9 9.0 9.6  Total Protein 6.0 - 8.3 g/dL - - -  Total Bilirubin 0.2 - 1.2 mg/dL - - -  Alkaline Phos 39 - 117 U/L - - -  AST 0 - 37 U/L - - -  ALT 0 - 35 U/L - - -    Lipid Panel  No results found for: CHOL, TRIG, HDL, CHOLHDL, VLDL, LDLCALC, LDLDIRECT  CBC    Component Value Date/Time   WBC 6.5 01/26/2019 1158   RBC 4.41 01/26/2019 1158   HGB 12.4 01/26/2019 1158   HCT 40.8 01/26/2019 1158   PLT 308 01/26/2019 1158   MCV 92.5 01/26/2019 1158   MCH 28.1 01/26/2019 1158   MCHC 30.4 01/26/2019 1158   RDW 12.4 01/26/2019 1158   LYMPHSABS 2.3 01/11/2014 1026   MONOABS 0.4 01/11/2014 1026   EOSABS 0.0 01/11/2014 1026   BASOSABS 0.0 01/11/2014 1026    No results found for: HGBA1C  Assessment & Plan:  1. GAD (generalized anxiety disorder) Anxiety has improved on Zoloft  - sertraline (ZOLOFT) 100 MG tablet; Take one pill once per day  Dispense: 90 tablet; Refill: 0  2. Other headache syndrome No red flags from history or exam Could be somatic symptom due to anxiety versus stress She has been advised to keep a symptom diary which will be reviewed with her PCP at her next visit Discussed warning signs and she knows to contact the clinic if this occurs.    No orders of the defined types were placed in this encounter.   Follow-up: Return in about 3 months (around 07/28/2020) for PCP for chronic disease management.       Charlott Rakes, MD, FAAFP. [facility] and [place], [place]   04/27/2020, 4:10 PM

## 2020-05-17 ENCOUNTER — Ambulatory Visit (HOSPITAL_COMMUNITY): Payer: Self-pay | Admitting: Psychiatry

## 2020-06-10 ENCOUNTER — Ambulatory Visit: Payer: Medicaid Other | Admitting: Family Medicine

## 2020-06-30 ENCOUNTER — Telehealth: Payer: Self-pay

## 2020-06-30 NOTE — Telephone Encounter (Signed)
Copied from [place] [phone]. Topic: General - Call Back - No Documentation >> Jun 29, 2020  4:35 PM Erick Blinks wrote: Reason for CRM: Pt needs a medical report sheet filed, and a TB skin test if PCP wants another one in addition to the one she says she just had last year.   Best contact: [phone]  Please advise patient

## 2020-07-04 ENCOUNTER — Ambulatory Visit: Payer: Medicaid Other

## 2020-07-21 ENCOUNTER — Telehealth: Payer: Self-pay | Admitting: Family Medicine

## 2020-07-21 NOTE — Telephone Encounter (Signed)
Paperwork placed in PCP box. 

## 2020-07-21 NOTE — Telephone Encounter (Signed)
Pt dropped off Health Assessment/Medical Report form to be filled out. Advised patient the would be place in PCP box but may not be filled out until her next appt 1/11. If form can be filled out sooner please advise Pt when to pick up.

## 2020-07-24 NOTE — Telephone Encounter (Signed)
Completed.

## 2020-07-26 ENCOUNTER — Ambulatory Visit: Payer: Medicaid Other

## 2020-07-26 ENCOUNTER — Other Ambulatory Visit: Payer: Self-pay

## 2020-07-26 ENCOUNTER — Ambulatory Visit: Payer: Medicaid Other | Attending: Family Medicine | Admitting: *Deleted

## 2020-07-26 DIAGNOSIS — Z111 Encounter for screening for respiratory tuberculosis: Secondary | ICD-10-CM

## 2020-07-26 NOTE — Progress Notes (Signed)
PPD Placement note Breanna Thomas, 40 y.o. female is here today for placement of PPD test Reason for PPD test: employment  Pt taken PPD test before: yes  Alert and oriented in NAD.   PPD placed on 07/26/2020.  Patient advised to return for reading within 48-72 hours.

## 2020-07-26 NOTE — Telephone Encounter (Signed)
Form has been picked up by patient.

## 2020-07-28 ENCOUNTER — Other Ambulatory Visit: Payer: Self-pay

## 2020-07-28 ENCOUNTER — Ambulatory Visit: Payer: Medicaid Other | Attending: Family Medicine

## 2020-07-28 DIAGNOSIS — Z111 Encounter for screening for respiratory tuberculosis: Secondary | ICD-10-CM

## 2020-07-28 NOTE — Progress Notes (Signed)
Patient came in for a PPD skin test reading. PPD was placed on 07/24/2019 on the left forearm. No duration, PPD is negative.

## 2020-08-02 ENCOUNTER — Other Ambulatory Visit: Payer: Self-pay

## 2020-08-02 ENCOUNTER — Ambulatory Visit: Payer: Medicaid Other | Attending: Family Medicine | Admitting: Family Medicine

## 2020-08-02 DIAGNOSIS — Z13228 Encounter for screening for other metabolic disorders: Secondary | ICD-10-CM | POA: Diagnosis not present

## 2020-08-02 DIAGNOSIS — F411 Generalized anxiety disorder: Secondary | ICD-10-CM

## 2020-08-02 DIAGNOSIS — Z1159 Encounter for screening for other viral diseases: Secondary | ICD-10-CM | POA: Diagnosis not present

## 2020-08-02 MED ORDER — SERTRALINE HCL 100 MG PO TABS: ORAL_TABLET | ORAL | 1 refills | Status: DC

## 2020-08-02 NOTE — Progress Notes (Signed)
Virtual Visit via Telephone Note  I connected with Breanna Thomas, on 08/02/2020 at 3:29 PM by telephone due to the COVID-19 pandemic and verified that I am speaking with the correct person using two identifiers.   Consent: I discussed the limitations, risks, security and privacy concerns of performing an evaluation and management service by telephone and the availability of in person appointments. I also discussed with the patient that there may be a patient responsible charge related to this service. The patient expressed understanding and agreed to proceed.   Location of Patient: Out and about  Location of Provider: Home   Persons participating in Telemedicine visit: Miangel E Francee Gentile Far[place]-CMA Dr. Margarita Rana     History of Present Illness: 40 year old female with a history of Anxiety who presents today for a follow up visit. At her last visit she had complained of headaches.  Headaches have improved and she thinks the headaches had been related to her anxiety as she had previously been out of her Lexapro. She had also complained of being stressed but this is now under better control. States she is up to date on her PAP smear but does not recall when she last had it; states it was <3 years in a Clinic in [place] as she had her birth control removed at the time. She has no additional concerns today.  Past Medical History:  Diagnosis Date  . Anemia   . Anxiety   . Anxiety   . Panic attacks   . Vertigo    No Known Allergies  Current Outpatient Medications on File Prior to Visit  Medication Sig Dispense Refill  . acetaminophen (TYLENOL) 500 MG tablet Take 1,000 mg by mouth every 6 (six) hours as needed for moderate pain.    Marland Kitchen albuterol (VENTOLIN HFA) 108 (90 Base) MCG/ACT inhaler Inhale 1-2 puffs into the lungs every 6 (six) hours as needed for wheezing or shortness of breath. 8 g 0  . Multiple Vitamin (MULTIVITAMIN) tablet Take 1 tablet by mouth daily.    .  Multiple Vitamins-Minerals (AIRBORNE GUMMIES PO) Take 3 tablets by mouth daily.    . sertraline (ZOLOFT) 100 MG tablet Take one pill once per day 90 tablet 0  . etonogestrel (NEXPLANON) 68 MG IMPL implant Inject 1 each into the skin once. (Patient not taking: No sig reported)     No current facility-administered medications on file prior to visit.    Observations/Objective: Awake, alert, oriented x3 Not in acute distress  Assessment and Plan: 1. GAD (generalized anxiety disorder) Controlled - sertraline (ZOLOFT) 100 MG tablet; Take one pill once per day  Dispense: 90 tablet; Refill: 1  2. Screening for metabolic disorder - [id number]; Future - Lipid panel; Future - CBC with Differential/Platelet; Future   Follow Up Instructions: 6 months   I discussed the assessment and treatment plan with the patient. The patient was provided an opportunity to ask questions and all were answered. The patient agreed with the plan and demonstrated an understanding of the instructions.   The patient was advised to call back or seek an in-person evaluation if the symptoms worsen or if the condition fails to improve as anticipated.     I provided 11 minutes total of non-face-to-face time during this encounter including median intraservice time, reviewing previous notes, investigations, ordering medications, medical decision making, coordinating care and patient verbalized understanding at the end of the visit.     Charlott Rakes, MD, FAAFP. [facility] and [facility]  [place], [place]   08/02/2020, 3:29 PM

## 2020-08-02 NOTE — Progress Notes (Signed)
Pt has no concerns today, states that headaches are getting better.

## 2020-08-12 ENCOUNTER — Other Ambulatory Visit: Payer: Medicaid Other

## 2021-02-24 ENCOUNTER — Other Ambulatory Visit: Payer: Self-pay

## 2021-02-24 ENCOUNTER — Encounter: Payer: Self-pay | Admitting: Emergency Medicine

## 2021-02-24 ENCOUNTER — Ambulatory Visit
Admission: EM | Admit: 2021-02-24 | Discharge: 2021-02-24 | Disposition: A | Discharge status: Home / Self Care | Payer: Medicaid Other | Attending: Emergency Medicine | Admitting: Emergency Medicine

## 2021-02-24 ENCOUNTER — Ambulatory Visit: Payer: Self-pay | Admitting: *Deleted

## 2021-02-24 DIAGNOSIS — U071 COVID-19: Secondary | ICD-10-CM

## 2021-02-24 MED ORDER — BENZONATATE 200 MG PO CAPS: 200.0000 mg | ORAL_CAPSULE | Freq: Three times a day (TID) | ORAL | 0 refills | Status: AC | PRN

## 2021-02-24 MED ORDER — IBUPROFEN 600 MG PO TABS: 600.0000 mg | ORAL_TABLET | Freq: Four times a day (QID) | ORAL | 0 refills | Status: DC | PRN

## 2021-02-24 MED ORDER — MOLNUPIRAVIR 200 MG PO CAPS: 800.0000 mg | ORAL_CAPSULE | Freq: Two times a day (BID) | ORAL | 0 refills | Status: AC

## 2021-02-24 MED ORDER — DM-GUAIFENESIN ER 30-600 MG PO TB12: 1.0000 | ORAL_TABLET | Freq: Two times a day (BID) | ORAL | 0 refills | Status: DC

## 2021-02-24 NOTE — Discharge Instructions (Addendum)
You may begin molnupravir-use backup protection x3 months Ibuprofen and Tylenol for fevers and headaches, alternate every 4 hours Mucinex DM twice daily for congestion and cough Tessalon for cough Rest and fluids Follow-up if not improving or worsening

## 2021-02-24 NOTE — ED Provider Notes (Signed)
[facility]    CSN: [id number] Arrival date & time: 02/24/21  1451      History   Chief Complaint Chief Complaint  Patient presents with   COVID+    HPI ARDATH LYKKEN is a 40 y.o. female presenting today for evaluation of COVID infection.  Reports that she has had fever headache and sore throat.  Reports positive COVID test yesterday.  Main symptoms have been headache, nighttime cough as well as fevers and fatigue.  Using Tylenol.  Inquiring about antivirals.  HPI  Past Medical History:  Diagnosis Date   Anemia    Anxiety    Anxiety    Panic attacks    Vertigo     Patient Active Problem List   Diagnosis Date Noted   Major depressive disorder, recurrent episode, moderate with anxious distress ([place]) 03/24/2020   Nonspecific abnormal electrocardiogram (ECG) (EKG) 02/26/2019   Morbid obesity ([place]) 02/26/2019   Chest discomfort 02/26/2019   Erroneous encounter - disregard 01/25/2019   GERD (gastroesophageal reflux disease) 04/20/2016   Cough 04/20/2016   IFG (impaired fasting glucose) 12/30/2013   Unspecified vitamin D deficiency 12/30/2013   Back muscle spasm 12/15/2013   Anxiety 04/10/2013    Past Surgical History:  Procedure Laterality Date   NO PAST SURGERIES      OB History     Gravida  3   Para  3   Term      Preterm      AB      Living         SAB      IAB      Ectopic      Multiple      Live Births               Home Medications    Prior to Admission medications   Medication Sig Start Date End Date Taking? Authorizing Provider  benzonatate (TESSALON) 200 MG capsule Take 1 capsule (200 mg total) by mouth 3 (three) times daily as needed for up to 7 days for cough. 02/24/21 03/03/21 Yes Roczen Waymire C, PA-C  dextromethorphan-guaiFENesin (MUCINEX DM) 30-600 MG 12hr tablet Take 1 tablet by mouth 2 (two) times daily. 02/24/21  Yes Marelin Tat C, PA-C  ibuprofen (ADVIL) 600 MG tablet Take 1 tablet (600 mg total) by  mouth every 6 (six) hours as needed. 02/24/21  Yes Malone Vanblarcom C, PA-C  Molnupiravir 200 MG CAPS Take 4 capsules (800 mg total) by mouth in the morning and at bedtime for 5 days. 02/24/21 03/01/21 Yes Jalaila Caradonna C, PA-C  acetaminophen (TYLENOL) 500 MG tablet Take 1,000 mg by mouth every 6 (six) hours as needed for moderate pain.    [provider]  albuterol (VENTOLIN HFA) 108 (90 Base) MCG/ACT inhaler Inhale 1-2 puffs into the lungs every 6 (six) hours as needed for wheezing or shortness of breath. 07/27/19   Ok Edwards, PA-C  etonogestrel (NEXPLANON) 68 MG IMPL implant Inject 1 each into the skin once. Patient not taking: No sig reported    [provider]  Multiple Vitamin (MULTIVITAMIN) tablet Take 1 tablet by mouth daily.    [provider]  Multiple Vitamins-Minerals (AIRBORNE GUMMIES PO) Take 3 tablets by mouth daily.    [provider]  sertraline (ZOLOFT) 100 MG tablet Take one pill once per day 08/02/20   Charlott Rakes, MD    Family History Family History  Adopted: Yes  Problem Relation Age of Onset  Diabetes Mother    Hypertension Mother    Heart disease Father    Seizures Father    Seizures Other    Heart failure Other    Diabetes Sister     Social History Social History   Tobacco Use   Smoking status: Never   Smokeless tobacco: Never  Vaping Use   Vaping Use: Never used  Substance Use Topics   Alcohol use: Not Currently    Alcohol/week: 1.0 standard drink    Types: 1 Glasses of wine per week    Comment: social   Drug use: No     Allergies   Patient has no known allergies.   Review of Systems Review of Systems  Constitutional:  Positive for fever. Negative for activity change, appetite change, chills and fatigue.  HENT:  Positive for sore throat. Negative for congestion, ear pain, rhinorrhea, sinus pressure and trouble swallowing.   Eyes:  Negative for discharge and redness.  Respiratory:  Positive for cough.  Negative for chest tightness and shortness of breath.   Cardiovascular:  Negative for chest pain.  Gastrointestinal:  Negative for abdominal pain, diarrhea, nausea and vomiting.  Musculoskeletal:  Negative for myalgias.  Skin:  Negative for rash.  Neurological:  Positive for headaches. Negative for dizziness and light-headedness.    Physical Exam Triage Vital Signs ED Triage Vitals  Enc Vitals Group     BP      Pulse      Resp      Temp      Temp src      SpO2      Weight      Height      Head Circumference      Peak Flow      Pain Score      Pain Loc      Pain Edu?      Excl. in [place]?    No data found.  Updated Vital Signs BP 138/85 (BP Location: Right Arm)   Pulse (!) 107   Temp 98.6 F (37 C) (Temporal)   Resp 20   SpO2 95%   Visual Acuity Right Eye Distance:   Left Eye Distance:   Bilateral Distance:    Right Eye Near:   Left Eye Near:    Bilateral Near:     Physical Exam Vitals and nursing note reviewed.  Constitutional:      Appearance: She is well-developed.     Comments: No acute distress  HENT:     Head: Normocephalic and atraumatic.     Ears:     Comments: Bilateral ears without tenderness to palpation of external auricle, tragus and mastoid, EAC's without erythema or swelling, TM's with good bony landmarks and cone of light. Non erythematous.      Nose: Nose normal.     Mouth/Throat:     Comments: Oral mucosa pink and moist, no tonsillar enlargement or exudate. Posterior pharynx patent and nonerythematous, no uvula deviation or swelling. Normal phonation.  Eyes:     Conjunctiva/sclera: Conjunctivae normal.  Cardiovascular:     Rate and Rhythm: Normal rate and regular rhythm.  Pulmonary:     Effort: Pulmonary effort is normal. No respiratory distress.     Comments: Breathing comfortably at rest, CTABL, no wheezing, rales or other adventitious sounds auscultated Abdominal:     General: There is no distension.  Musculoskeletal:         General: Normal range of motion.     Cervical back:  Neck supple.  Skin:    General: Skin is warm and dry.  Neurological:     Mental Status: She is alert and oriented to person, place, and time.     UC Treatments / Results  Labs (all labs ordered are listed, but only abnormal results are displayed) Labs Reviewed - No data to display  EKG   Radiology No results found.  Procedures Procedures (including critical care time)  Medications Ordered in UC Medications - No data to display  Initial Impression / Assessment and Plan / UC Course  I have reviewed the triage vital signs and the nursing notes.  Pertinent labs & imaging results that were available during my care of the patient were reviewed by me and considered in my medical decision making (see chart for details).     Suspected COVID infection-prior COVID test positive, discussed symptomatic and supportive care and recommendations provided, discussed options of Paxlovid and molnupravir reviewed with patient and opted proceed with molnupravir.  Instructed to use backup protection x3 months after use.  Discussed strict return precautions. Patient verbalized understanding and is agreeable with plan.  Final Clinical Impressions(s) / UC Diagnoses   Final diagnoses:  [id number]     Discharge Instructions      You may begin molnupravir-use backup protection x3 months Ibuprofen and Tylenol for fevers and headaches, alternate every 4 hours Mucinex DM twice daily for congestion and cough Tessalon for cough Rest and fluids Follow-up if not improving or worsening     ED Prescriptions     Medication Sig Dispense Auth. Provider   Molnupiravir 200 MG CAPS Take 4 capsules (800 mg total) by mouth in the morning and at bedtime for 5 days. 40 capsule Toryn Dewalt C, PA-C   benzonatate (TESSALON) 200 MG capsule Take 1 capsule (200 mg total) by mouth 3 (three) times daily as needed for up to 7 days for cough. 28 capsule  Amarie Tarte C, PA-C   dextromethorphan-guaiFENesin (MUCINEX DM) 30-600 MG 12hr tablet Take 1 tablet by mouth 2 (two) times daily. 16 tablet Leeloo Silverthorne C, PA-C   ibuprofen (ADVIL) 600 MG tablet Take 1 tablet (600 mg total) by mouth every 6 (six) hours as needed. 30 tablet Samoria Fedorko, Celoron C, PA-C      PDMP not reviewed this encounter.   Janith Lima, [place] 02/28/21 [phone]

## 2021-02-24 NOTE — ED Triage Notes (Signed)
Patient presents to [facility] for evaluation after being diagnosed with COVID yesterday.  Patient states she is having severe headache, cough that is worse at night, and fever (102).  Patient states Tylenol is helping with fever, has not tried other OTC meds.  Asking about anti-virals

## 2021-02-24 NOTE — Telephone Encounter (Signed)
The patient tested positive for COVID 19 at [facility] 02/23/21   The patient shares that they're experiencing an elevated temperature as well as cough and congestion   The patient has experienced symptoms for roughly 2 days   Please contact further when possible   Pt. Tested positive for COVID 19. States she has fever and headache. States she is going to UC.  Answer Assessment - Initial Assessment Questions 1. COVID-19 DIAGNOSIS: "Who made your COVID-19 diagnosis?" "Was it confirmed by a positive lab test or self-test?" If not diagnosed by a doctor (or NP/PA), ask "Are there lots of cases (community spread) where you live?" Note: See public health department website, if unsure.     [facility] test 2. COVID-19 EXPOSURE: "Was there any known exposure to COVID before the symptoms began?" CDC Definition of close contact: within 6 feet (2 meters) for a total of 15 minutes or more over a 24-hour period.      Yes 3. ONSET: "When did the COVID-19 symptoms start?"      2 days ago 4. WORST SYMPTOM: "What is your worst symptom?" (e.g., cough, fever, shortness of breath, muscle aches)     Fever 5. COUGH: "Do you have a cough?" If Yes, ask: "How bad is the cough?"       No 6. FEVER: "Do you have a fever?" If Yes, ask: "What is your temperature, how was it measured, and when did it start?"     102 7. RESPIRATORY STATUS: "Describe your breathing?" (e.g., shortness of breath, wheezing, unable to speak)      No 8. BETTER-SAME-WORSE: "Are you getting better, staying the same or getting worse compared to yesterday?"  If getting worse, ask, "In what way?"     Worse 9. HIGH RISK DISEASE: "Do you have any chronic medical problems?" (e.g., asthma, heart or lung disease, weak immune system, obesity, etc.)     No 10. VACCINE: "Have you had the COVID-19 vaccine?" If Yes, ask: "Which one, how many shots, when did you get it?"        11. BOOSTER: "Have you received your COVID-19 booster?" If Yes, ask: "Which one and when  did you get it?"        12. PREGNANCY: "Is there any chance you are pregnant?" "When was your last menstrual period?"       No 13. OTHER SYMPTOMS: "Do you have any other symptoms?"  (e.g., chills, fatigue, headache, loss of smell or taste, muscle pain, sore throat)       Headache 14. O2 SATURATION MONITOR:  "Do you use an oxygen saturation monitor (pulse oximeter) at home?" If Yes, ask "What is your reading (oxygen level) today?" "What is your usual oxygen saturation reading?" (e.g., 95%)       No  Protocols used: Coronavirus (COVID-19) Diagnosed or Suspected-A-AH

## 2021-02-27 NOTE — Telephone Encounter (Signed)
Please schedule a telehealth visit with me for tomorrow; I have openings in the morning.

## 2021-02-27 NOTE — Telephone Encounter (Signed)
Done

## 2021-02-28 ENCOUNTER — Other Ambulatory Visit: Payer: Self-pay

## 2021-02-28 ENCOUNTER — Encounter: Payer: Self-pay | Admitting: Family Medicine

## 2021-02-28 ENCOUNTER — Ambulatory Visit: Payer: Medicaid Other | Attending: Family Medicine | Admitting: Family Medicine

## 2021-02-28 DIAGNOSIS — U071 COVID-19: Secondary | ICD-10-CM

## 2021-02-28 NOTE — Progress Notes (Signed)
Virtual Visit via Telephone Note  I connected with Breanna Thomas, on 02/28/2021 at 8:45 AM by telephone due to the COVID-19 pandemic and verified that I am speaking with the correct person using two identifiers.   Consent: I discussed the limitations, risks, security and privacy concerns of performing an evaluation and management service by telephone and the availability of in person appointments. I also discussed with the patient that there may be a patient responsible charge related to this service. The patient expressed understanding and agreed to proceed.   Location of Patient: Home  Location of Provider: Clinic   Persons participating in Telemedicine visit: [name] NP - [name] Dr. Margarita Rana     History of Present Illness: 40 year old female with a history of anxiety is seen for an acute visit. Tested positive on 02/23/21 for COVID-19 and was seen at the ED on 02/24/2021 commenced on Molnupiravir Vaccinated x1; last dose was in 2021 -[employer]. Feels like she has a sinus infection but has no fever, myalgias, dyspnea.  Taking Tessalon Per[place] for cough.  Past Medical History:  Diagnosis Date   Anemia    Anxiety    Anxiety    Panic attacks    Vertigo    No Known Allergies  Current Outpatient Medications on File Prior to Visit  Medication Sig Dispense Refill   acetaminophen (TYLENOL) 500 MG tablet Take 1,000 mg by mouth every 6 (six) hours as needed for moderate pain.     albuterol (VENTOLIN HFA) 108 (90 Base) MCG/ACT inhaler Inhale 1-2 puffs into the lungs every 6 (six) hours as needed for wheezing or shortness of breath. 8 g 0   benzonatate (TESSALON) 200 MG capsule Take 1 capsule (200 mg total) by mouth 3 (three) times daily as needed for up to 7 days for cough. 28 capsule 0   dextromethorphan-guaiFENesin (MUCINEX DM) 30-600 MG 12hr tablet Take 1 tablet by mouth 2 (two) times daily. 16 tablet 0   etonogestrel (NEXPLANON) 68 MG IMPL implant Inject 1  each into the skin once. (Patient not taking: No sig reported)     ibuprofen (ADVIL) 600 MG tablet Take 1 tablet (600 mg total) by mouth every 6 (six) hours as needed. 30 tablet 0   Molnupiravir 200 MG CAPS Take 4 capsules (800 mg total) by mouth in the morning and at bedtime for 5 days. 40 capsule 0   Multiple Vitamin (MULTIVITAMIN) tablet Take 1 tablet by mouth daily.     Multiple Vitamins-Minerals (AIRBORNE GUMMIES PO) Take 3 tablets by mouth daily.     sertraline (ZOLOFT) 100 MG tablet Take one pill once per day 90 tablet 1   No current facility-administered medications on file prior to visit.    ROS: See HPI  Observations/Objective: Awake, alert, oriented x3 Not in acute distress Normal mood  Assessment and Plan: 1. COVID-19 virus infection Stable Currently on Molnupiravir Counselled on adverse effect-teratogenicity; advised to use contraception/avoid sexual intercourse up to 4 days post completion of therapy Discussed isolation protocol Notify clinic if symptoms worsen   Follow Up Instructions: Keep previously scheduled appointment   I discussed the assessment and treatment plan with the patient. The patient was provided an opportunity to ask questions and all were answered. The patient agreed with the plan and demonstrated an understanding of the instructions.   The patient was advised to call back or seek an in-person evaluation if the symptoms worsen or if the condition fails to improve as anticipated.  I provided 11 minutes total of non-face-to-face time during this encounter.   Charlott Rakes, MD, FAAFP. [facility] and [facility], [place]   02/28/2021, 8:45 AM

## 2021-04-06 ENCOUNTER — Ambulatory Visit: Payer: Medicaid Other | Attending: Family Medicine

## 2021-04-06 ENCOUNTER — Other Ambulatory Visit: Payer: Self-pay

## 2021-04-06 DIAGNOSIS — Z13228 Encounter for screening for other metabolic disorders: Secondary | ICD-10-CM | POA: Diagnosis not present

## 2021-04-07 LAB — LIPID PANEL
Chol/HDL Ratio: 5 ratio — ABNORMAL HIGH (ref 0.0–4.4)
Cholesterol, Total: 199 mg/dL (ref 100–199)
HDL: 40 mg/dL (ref >39)
LDL Chol Calc (NIH): 140 mg/dL — ABNORMAL HIGH (ref 0–99)
Triglycerides: 104 mg/dL (ref 0–149)
VLDL Cholesterol Cal: 19 mg/dL (ref 5–40)

## 2021-04-07 LAB — CMP14+EGFR
ALT: 10 IU/L (ref 0–32)
AST: 19 IU/L (ref 0–40)
Albumin/Globulin Ratio: 1.3 (ref 1.2–2.2)
Albumin: 3.9 g/dL (ref 3.8–4.8)
Alkaline Phosphatase: 86 IU/L (ref 44–121)
BUN/Creatinine Ratio: 14 (ref 9–23)
BUN: 10 mg/dL (ref 6–20)
Bilirubin Total: 0.3 mg/dL (ref 0.0–1.2)
CO2: 23 mmol/L (ref 20–29)
Calcium: 9.4 mg/dL (ref 8.7–10.2)
Chloride: 105 mmol/L (ref 96–106)
Creatinine, Ser: 0.74 mg/dL (ref 0.57–1.00)
Globulin, Total: 2.9 g/dL (ref 1.5–4.5)
Glucose: 91 mg/dL (ref 65–99)
Potassium: 4.5 mmol/L (ref 3.5–5.2)
Sodium: 138 mmol/L (ref 134–144)
Total Protein: 6.8 g/dL (ref 6.0–8.5)
eGFR: 105 mL/min/{1.73_m2} (ref >59)

## 2021-04-07 LAB — CBC WITH DIFFERENTIAL/PLATELET
Basophils Absolute: 0 10*3/uL (ref 0.0–0.2)
Basos: 1 %
EOS (ABSOLUTE): 0.1 10*3/uL (ref 0.0–0.4)
Eos: 1 %
Hematocrit: 38 % (ref 34.0–46.6)
Hemoglobin: 11.9 g/dL (ref 11.1–15.9)
Immature Grans (Abs): 0 10*3/uL (ref 0.0–0.1)
Immature Granulocytes: 0 %
Lymphocytes Absolute: 2.6 10*3/uL (ref 0.7–3.1)
Lymphs: 37 %
MCH: 27.8 pg (ref 26.6–33.0)
MCHC: 31.3 g/dL — ABNORMAL LOW (ref 31.5–35.7)
MCV: 89 fL (ref 79–97)
Monocytes Absolute: 0.5 10*3/uL (ref 0.1–0.9)
Monocytes: 7 %
Neutrophils Absolute: 3.9 10*3/uL (ref 1.4–7.0)
Neutrophils: 54 %
Platelets: 367 10*3/uL (ref 150–450)
RBC: 4.28 x10E6/uL (ref 3.77–5.28)
RDW: 11.9 % (ref 11.7–15.4)
WBC: 7 10*3/uL (ref 3.4–10.8)

## 2021-04-16 DIAGNOSIS — H5213 Myopia, bilateral: Secondary | ICD-10-CM | POA: Diagnosis not present

## 2021-04-21 ENCOUNTER — Telehealth: Payer: Self-pay

## 2021-04-21 NOTE — Telephone Encounter (Signed)
-----   Message from Charlott Rakes, MD sent at 04/07/2021  8:01 PM EDT ----- Kidney and liver are normal. Bad cholesterol is elevated but no medication recommended yet due to low Cardiovascular risk. Advise to work on low Cholesterol diet, exercise.

## 2021-04-21 NOTE — Telephone Encounter (Signed)
Patient name and DOB has been verified Patient was informed of lab results. Patient had no questions.  

## 2021-05-01 ENCOUNTER — Ambulatory Visit: Payer: Medicaid Other | Attending: Family Medicine | Admitting: Family Medicine

## 2021-05-01 ENCOUNTER — Telehealth: Payer: Self-pay | Admitting: Family Medicine

## 2021-05-01 ENCOUNTER — Encounter: Payer: Self-pay | Admitting: Family Medicine

## 2021-05-01 ENCOUNTER — Other Ambulatory Visit: Payer: Self-pay

## 2021-05-01 VITALS — BP 108/75 | HR 93 | Ht 64.0 in | Wt 287.0 lb

## 2021-05-01 DIAGNOSIS — Z0001 Encounter for general adult medical examination with abnormal findings: Secondary | ICD-10-CM

## 2021-05-01 DIAGNOSIS — R29818 Other symptoms and signs involving the nervous system: Secondary | ICD-10-CM

## 2021-05-01 DIAGNOSIS — L811 Chloasma: Secondary | ICD-10-CM

## 2021-05-01 DIAGNOSIS — Z131 Encounter for screening for diabetes mellitus: Secondary | ICD-10-CM

## 2021-05-01 DIAGNOSIS — Z23 Encounter for immunization: Secondary | ICD-10-CM

## 2021-05-01 DIAGNOSIS — R21 Rash and other nonspecific skin eruption: Secondary | ICD-10-CM

## 2021-05-01 DIAGNOSIS — F411 Generalized anxiety disorder: Secondary | ICD-10-CM | POA: Diagnosis not present

## 2021-05-01 DIAGNOSIS — Z0000 Encounter for general adult medical examination without abnormal findings: Secondary | ICD-10-CM

## 2021-05-01 LAB — POCT GLYCOSYLATED HEMOGLOBIN (HGB A1C): Hemoglobin A1C: 5.2 % (ref 4.0–5.6)

## 2021-05-01 MED ORDER — TRIAMCINOLONE ACETONIDE 0.1 % EX CREA: 1.0000 "application " | TOPICAL_CREAM | Freq: Two times a day (BID) | CUTANEOUS | 1 refills | Status: DC

## 2021-05-01 MED ORDER — SERTRALINE HCL 100 MG PO TABS: ORAL_TABLET | ORAL | 1 refills | Status: DC

## 2021-05-01 NOTE — Telephone Encounter (Signed)
Done

## 2021-05-01 NOTE — Patient Instructions (Signed)
Health Maintenance, Female Adopting a healthy lifestyle and getting preventive care are important in promoting health and wellness. Ask your health care provider about: The right schedule for you to have regular tests and exams. Things you can do on your own to prevent diseases and keep yourself healthy. What should I know about diet, weight, and exercise? Eat a healthy diet  Eat a diet that includes plenty of vegetables, fruits, low-fat dairy products, and lean protein. Do not eat a lot of foods that are high in solid fats, added sugars, or sodium. Maintain a healthy weight Body mass index (BMI) is used to identify weight problems. It estimates body fat based on height and weight. Your health care provider can help determine your BMI and help you achieve or maintain a healthy weight. Get regular exercise Get regular exercise. This is one of the most important things you can do for your health. Most adults should: Exercise for at least 150 minutes each week. The exercise should increase your heart rate and make you sweat (moderate-intensity exercise). Do strengthening exercises at least twice a week. This is in addition to the moderate-intensity exercise. Spend less time sitting. Even light physical activity can be beneficial. Watch cholesterol and blood lipids Have your blood tested for lipids and cholesterol at 39 years of age, then have this test every 5 years. Have your cholesterol levels checked more often if: Your lipid or cholesterol levels are high. You are older than 40 years of age. You are at high risk for heart disease. What should I know about cancer screening? Depending on your health history and family history, you may need to have cancer screening at various ages. This may include screening for: Breast cancer. Cervical cancer. Colorectal cancer. Skin cancer. Lung cancer. What should I know about heart disease, diabetes, and high blood pressure? Blood pressure and heart  disease High blood pressure causes heart disease and increases the risk of stroke. This is more likely to develop in people who have high blood pressure readings, are of African descent, or are overweight. Have your blood pressure checked: Every 3-5 years if you are 18-39 years of age. Every year if you are 40 years old or older. Diabetes Have regular diabetes screenings. This checks your fasting blood sugar level. Have the screening done: Once every three years after age 40 if you are at a normal weight and have a low risk for diabetes. More often and at a younger age if you are overweight or have a high risk for diabetes. What should I know about preventing infection? Hepatitis B If you have a higher risk for hepatitis B, you should be screened for this virus. Talk with your health care provider to find out if you are at risk for hepatitis B infection. Hepatitis C Testing is recommended for: Everyone born from 1945 through 1965. Anyone with known risk factors for hepatitis C. Sexually transmitted infections (STIs) Get screened for STIs, including gonorrhea and chlamydia, if: You are sexually active and are younger than 39 years of age. You are older than 39 years of age and your health care provider tells you that you are at risk for this type of infection. Your sexual activity has changed since you were last screened, and you are at increased risk for chlamydia or gonorrhea. Ask your health care provider if you are at risk. Ask your health care provider about whether you are at high risk for HIV. Your health care provider may recommend a prescription medicine   to help prevent HIV infection. If you choose to take medicine to prevent HIV, you should first get tested for HIV. You should then be tested every 3 months for as long as you are taking the medicine. Pregnancy If you are about to stop having your period (premenopausal) and you may become pregnant, seek counseling before you get  pregnant. Take 400 to 800 micrograms (mcg) of folic acid every day if you become pregnant. Ask for birth control (contraception) if you want to prevent pregnancy. Osteoporosis and menopause Osteoporosis is a disease in which the bones lose minerals and strength with aging. This can result in bone fractures. If you are 65 years old or older, or if you are at risk for osteoporosis and fractures, ask your health care provider if you should: Be screened for bone loss. Take a calcium or vitamin D supplement to lower your risk of fractures. Be given hormone replacement therapy (HRT) to treat symptoms of menopause. Follow these instructions at home: Lifestyle Do not use any products that contain nicotine or tobacco, such as cigarettes, e-cigarettes, and chewing tobacco. If you need help quitting, ask your health care provider. Do not use street drugs. Do not share needles. Ask your health care provider for help if you need support or information about quitting drugs. Alcohol use Do not drink alcohol if: Your health care provider tells you not to drink. You are pregnant, may be pregnant, or are planning to become pregnant. If you drink alcohol: Limit how much you use to 0-1 drink a day. Limit intake if you are breastfeeding. Be aware of how much alcohol is in your drink. In the [place] one drink equals one 12 oz bottle of beer (355 mL), one 5 oz glass of wine (148 mL), or one 1 oz glass of hard liquor (44 mL). General instructions Schedule regular health, dental, and eye exams. Stay current with your vaccines. Tell your health care provider if: You often feel depressed. You have ever been abused or do not feel safe at home. Summary Adopting a healthy lifestyle and getting preventive care are important in promoting health and wellness. Follow your health care provider's instructions about healthy diet, exercising, and getting tested or screened for diseases. Follow your health care provider's  instructions on monitoring your cholesterol and blood pressure. This information is not intended to replace advice given to you by your health care provider. Make sure you discuss any questions you have with your health care provider. Document Revised: 09/16/2020 Document Reviewed: 07/02/2018 Elsevier Patient Education  2022 [employer] Inc.  

## 2021-05-01 NOTE — Progress Notes (Signed)
Physical/ NO PAP.

## 2021-05-01 NOTE — Progress Notes (Signed)
Subjective:  Patient ID: Breanna Thomas, female    DOB: July 01, 1981  Age: 40 y.o. MRN: [id number]  CC: Annual Exam   HPI Breanna Thomas is a 40 y.o. year old female with a history of obesity who presents today for an annual physical exam.  Interval History: She has an upcoming appointment with her Gynecologist. Last PAP smear was normal last year. She would like a referral to a Nutritionist to help with weight loss. She has cut out sweets but does still find it difficult to lose weight.  She has a hyperpigmentation on her left cheek and some bumps which have been present for several years.  She received a cream from her previous dermatologist which was ineffective.  She wakes up to catch her breath at night and is always fatigued.  Her children have told her that she snores.  She denies presence of daytime headaches. Past Medical History:  Diagnosis Date   Anemia    Anxiety    Anxiety    Panic attacks    Vertigo     Past Surgical History:  Procedure Laterality Date   NO PAST SURGERIES      Family History  Adopted: Yes  Problem Relation Age of Onset   Diabetes Mother    Hypertension Mother    Heart disease Father    Seizures Father    Seizures Other    Heart failure Other    Diabetes Sister     No Known Allergies  Outpatient Medications Prior to Visit  Medication Sig Dispense Refill   Multiple Vitamin (MULTIVITAMIN) tablet Take 1 tablet by mouth daily.     sertraline (ZOLOFT) 100 MG tablet Take one pill once per day 90 tablet 1   acetaminophen (TYLENOL) 500 MG tablet Take 1,000 mg by mouth every 6 (six) hours as needed for moderate pain. (Patient not taking: Reported on 05/01/2021)     albuterol (VENTOLIN HFA) 108 (90 Base) MCG/ACT inhaler Inhale 1-2 puffs into the lungs every 6 (six) hours as needed for wheezing or shortness of breath. (Patient not taking: Reported on 05/01/2021) 8 g 0   dextromethorphan-guaiFENesin (MUCINEX DM) 30-600 MG 12hr tablet Take 1  tablet by mouth 2 (two) times daily. (Patient not taking: Reported on 05/01/2021) 16 tablet 0   etonogestrel (NEXPLANON) 68 MG IMPL implant Inject 1 each into the skin once. (Patient not taking: No sig reported)     ibuprofen (ADVIL) 600 MG tablet Take 1 tablet (600 mg total) by mouth every 6 (six) hours as needed. (Patient not taking: Reported on 05/01/2021) 30 tablet 0   Multiple Vitamins-Minerals (AIRBORNE GUMMIES PO) Take 3 tablets by mouth daily. (Patient not taking: Reported on 05/01/2021)     No facility-administered medications prior to visit.     ROS Review of Systems  Constitutional:  Negative for activity change, appetite change and fatigue.  HENT:  Negative for congestion, sinus pressure and sore throat.   Eyes:  Negative for visual disturbance.  Respiratory:  Negative for cough, chest tightness, shortness of breath and wheezing.   Cardiovascular:  Negative for chest pain and palpitations.  Gastrointestinal:  Negative for abdominal distention, abdominal pain and constipation.  Endocrine: Negative for polydipsia.  Genitourinary:  Negative for dysuria and frequency.  Musculoskeletal:  Negative for arthralgias and back pain.  Skin:  Positive for rash.  Neurological:  Negative for tremors, light-headedness and numbness.  Hematological:  Does not bruise/bleed easily.  Psychiatric/Behavioral:  Negative for agitation and behavioral problems.  Objective:  BP 108/75   Pulse 93   Ht 5\' 4"  (1.626 m)   Wt 287 lb (130.2 kg)   SpO2 100%   BMI 49.26 kg/m   BP/Weight 05/01/2021 02/24/2021 71/08/4578  Systolic BP 998 338 250  Diastolic BP 75 85 83  Wt. (Lbs) 287 - 273.4  BMI 49.26 - 46.93      Physical Exam Constitutional:      General: She is not in acute distress.    Appearance: She is well-developed. She is obese. She is not diaphoretic.  HENT:     Head: Normocephalic.     Right Ear: External ear normal.     Left Ear: External ear normal.     Nose: Nose normal.      Mouth/Throat:     Mouth: Mucous membranes are moist.  Eyes:     Extraocular Movements: Extraocular movements intact.     Conjunctiva/sclera: Conjunctivae normal.     Pupils: Pupils are equal, round, and reactive to light.  Neck:     Vascular: No JVD.  Cardiovascular:     Rate and Rhythm: Normal rate and regular rhythm.     Pulses: Normal pulses.     Heart sounds: Normal heart sounds. No murmur heard.   No gallop.  Pulmonary:     Effort: Pulmonary effort is normal. No respiratory distress.     Breath sounds: Normal breath sounds. No wheezing or rales.  Chest:     Chest wall: No tenderness.  Abdominal:     General: Bowel sounds are normal. There is no distension.     Palpations: Abdomen is soft. There is no mass.     Tenderness: There is no abdominal tenderness.  Musculoskeletal:        General: No tenderness. Normal range of motion.     Cervical back: Normal range of motion.  Skin:    General: Skin is warm and dry.     Comments: Hyperpigmentation of bilateral temporal region.  Left temporal region with cluster of minute nodules  Neurological:     Mental Status: She is alert and oriented to person, place, and time.     Deep Tendon Reflexes: Reflexes are normal and symmetric.  Psychiatric:        Mood and Affect: Mood normal.    CMP Latest Ref Rng & Units 04/06/2021 01/26/2019 01/23/2019  Glucose 65 - 99 mg/dL 91 95 90  BUN 6 - 20 mg/dL 10 8 9   Creatinine 0.57 - 1.00 mg/dL 0.74 0.70 0.73  Sodium 134 - 144 mmol/L 138 137 139  Potassium 3.5 - 5.2 mmol/L 4.5 4.0 3.8  Chloride 96 - 106 mmol/L 105 108 109  CO2 20 - 29 mmol/L 23 22 23   Calcium 8.7 - 10.2 mg/dL 9.4 8.9 9.0  Total Protein 6.0 - 8.5 g/dL 6.8 - -  Total Bilirubin 0.0 - 1.2 mg/dL 0.3 - -  Alkaline Phos 44 - 121 IU/L 86 - -  AST 0 - 40 IU/L 19 - -  ALT 0 - 32 IU/L 10 - -    Lipid Panel     Component Value Date/Time   CHOL 199 04/06/2021 1209   TRIG 104 04/06/2021 1209   HDL 40 04/06/2021 1209   CHOLHDL 5.0 (H)  04/06/2021 1209   LDLCALC 140 (H) 04/06/2021 1209    CBC    Component Value Date/Time   WBC 7.0 04/06/2021 1209   WBC 6.5 01/26/2019 1158   RBC 4.28 04/06/2021 1209  RBC 4.41 01/26/2019 1158   HGB 11.9 04/06/2021 1209   HCT 38.0 04/06/2021 1209   PLT 367 04/06/2021 1209   MCV 89 04/06/2021 1209   MCH 27.8 04/06/2021 1209   MCH 28.1 01/26/2019 1158   MCHC 31.3 (L) 04/06/2021 1209   MCHC 30.4 01/26/2019 1158   RDW 11.9 04/06/2021 1209   LYMPHSABS 2.6 04/06/2021 1209   MONOABS 0.4 01/11/2014 1026   EOSABS 0.1 04/06/2021 1209   BASOSABS 0.0 04/06/2021 1209    Lab Results  Component Value Date   HGBA1C 5.2 05/01/2021    Assessment & Plan:  1. Annual physical exam Counseled on 150 minutes of exercise per week, healthy eating (including decreased daily intake of saturated fats, cholesterol, added sugars, sodium), routine healthcare maintenance.   2. Melasma Need to screen for diabetes - triamcinolone cream (KENALOG) 0.1 %; Apply 1 application topically 2 (two) times daily.  Dispense: 30 g; Refill: 1  3. Suspected sleep apnea - Split night study; Future  4. Screening for diabetes mellitus Normal with A1c of 5.2 - POCT glycosylated hemoglobin (Hb A1C)  5. Rash and nonspecific skin eruption Will place on topical steroid meanwhile will refer to dermatology - Ambulatory referral to Dermatology  6. Morbid obesity ([place]) - Amb ref to Medical Nutrition Therapy-MNT  7. Need for immunization against influenza - Flu Vaccine QUAD 45mo+IM (Fluarix, Fluzone & Alfiuria Quad PF)   Meds ordered this encounter  Medications   triamcinolone cream (KENALOG) 0.1 %    Sig: Apply 1 application topically 2 (two) times daily.    Dispense:  30 g    Refill:  1   sertraline (ZOLOFT) 100 MG tablet    Sig: Take one pill once per day    Dispense:  90 tablet    Refill:  1    Follow-up: Return in about 1 year (around 05/01/2022) for Annual physical exam.       Charlott Rakes, MD,  FAAFP. [facility], [place]   05/01/2021, 5:33 PM

## 2021-05-01 NOTE — Telephone Encounter (Signed)
Routing to PCP for review.

## 2021-05-01 NOTE — Telephone Encounter (Signed)
Per Patient she forgot to ask for anxiety medication during her appt with Dr. Margarita Rana. She is requesting a refill on her Zoloft prescription. Please advise

## 2021-05-04 ENCOUNTER — Telehealth: Payer: Self-pay | Admitting: Family Medicine

## 2021-05-04 NOTE — Telephone Encounter (Signed)
Results has been printed and placed upfront for pick up.  Pt was called but got busy signal.

## 2021-05-04 NOTE — Telephone Encounter (Signed)
Pt came in today requesting results from a  tb test done 07/2020 and needed a print out last seen 05/01/21

## 2021-05-05 ENCOUNTER — Telehealth: Payer: Self-pay | Admitting: *Deleted

## 2021-05-05 NOTE — Telephone Encounter (Signed)
Copied from [place] [id number]. Topic: General - Other >> May 04, 2021  3:52 PM Pawlus, Brayton Layman A wrote: Reason for CRM: Pt wanted a copy of her last TB skin test with the results, pt was advised to activate MyChart to get access to lab results from past visits but pt declined. >> May 04, 2021  3:56 PM Pawlus, Brayton Layman A wrote: Pt stated the TB skin test results she picked up at the office were incorrect? Please advise.

## 2021-05-09 NOTE — Telephone Encounter (Signed)
Pt was called and VM was left informing patient that paperwork is ready for pick up

## 2021-07-03 ENCOUNTER — Ambulatory Visit
Admission: EM | Admit: 2021-07-03 | Discharge: 2021-07-03 | Disposition: A | Discharge status: Home / Self Care | Payer: Medicaid Other | Attending: Emergency Medicine | Admitting: Emergency Medicine

## 2021-07-03 ENCOUNTER — Other Ambulatory Visit: Payer: Self-pay

## 2021-07-03 DIAGNOSIS — R0981 Nasal congestion: Secondary | ICD-10-CM

## 2021-07-03 DIAGNOSIS — J069 Acute upper respiratory infection, unspecified: Secondary | ICD-10-CM

## 2021-07-03 DIAGNOSIS — J3489 Other specified disorders of nose and nasal sinuses: Secondary | ICD-10-CM | POA: Diagnosis not present

## 2021-07-03 MED ORDER — PROMETHAZINE-DM 6.25-15 MG/5ML PO SYRP: 5.0000 mL | ORAL_SOLUTION | Freq: Four times a day (QID) | ORAL | 0 refills | Status: DC | PRN

## 2021-07-03 MED ORDER — IBUPROFEN 400 MG PO TABS: 400.0000 mg | ORAL_TABLET | Freq: Four times a day (QID) | ORAL | 0 refills | Status: DC | PRN

## 2021-07-03 MED ORDER — IPRATROPIUM BROMIDE 0.06 % NA SOLN: 2.0000 | Freq: Four times a day (QID) | NASAL | 0 refills | Status: DC

## 2021-07-03 NOTE — ED Triage Notes (Signed)
Pt reports of having congestion, productive cough, sore that and pain in chest from congestion/ cough. Started: 3 days ago

## 2021-07-03 NOTE — Discharge Instructions (Signed)
Your symptoms are most consistent with a viral upper respiratory illness.  Because you are outside the window of benefit for antiviral treatment, viral testing is not indicated at this time.  For congestion and runny nose, recommend that you begin using ipratropium nasal spray, 2 sprays in each nare up to 4 times daily as needed.    Please also begin ibuprofen 400 mg 3 times daily to reduce inflammation in the upper and lower airways.  The interaction to which you are referring between ibuprofen and sertraline includes slightly increased risk of upper GI bleeding.  At 400 mg 3 times daily for no more than 3 to 4 days, I do not anticipate this will be a problem for you.  For nighttime cough, please take 5 mL of Promethazine DM at bedtime, this is a mild decongestant, cough suppressant and excellent sleep medication.  Please remain home from work, school, public places until you have been fever free for 24 hours without the use of antifever medications such as Tylenol or ibuprofen.  Conservative care is recommended at this time.  This includes rest, pushing clear fluids and activity as tolerated.  You may also noticed that your appetite is reduced, this is okay as long as they are drinking plenty of clear fluids.  Acetaminophen (Tylenol): This is a good fever reducer.  If there body temperature rises above 101.5 as measured with a thermometer, it is recommended that you give them 1,000 mg every 6-8 hours until they are temperature falls below 101.5, please not take more than 3,000 mg of acetaminophen either as a separate medication or as in ingredient in an over-the-counter cold/flu preparation within a 24-hour period  Ibuprofen  (Advil, Motrin): This is a good anti-inflammatory medication which addresses aches and pains and, to some degree, congestion in the nasal passages.  I recommend giving between 400 to 600 mg every 6-8 hours as needed.  Pseudoephedrine (Sudafed): This is a decongestant.  This  medication has to be purchased from the pharmacist counter, I recommend giving 2 tablets, 60 mg, 2-3 times a day as needed to relieve runny nose and sinus drainage.  Guaifenesin (Robitussin, Mucinex): This is an expectorant.  This helps break up chest congestion and loosen up thick nasal drainage making phlegm and drainage more liquid and therefore easier to remove.  I recommend being 400 mg three times daily as needed.  Dextromethorphan (any cough medicine with the letters "DM" added to it's name such as Robitussin DM): This is a cough suppressant.  This is often recommended to be taken at nighttime to suppress cough and help children sleep.  Give dosage as directed on the bottle.   Chloraseptic Throat Spray: Spray 5 sprays into affected area every 2 hours, hold for 15 seconds and either swallow or spit it out.  This is a excellent numbing medication because it is a spray, you can put it right where you needed and so sucking on a lozenge and numbing your entire mouth.  Based on my physical exam findings and the history provided  today, I do not see any evidence of bacterial infection therefore treatment with antibiotics would be of no benefit.  Please follow-up within the next 3 to 5 days either with your primary care provider or urgent care if your symptoms do not resolve.  If you do not have a primary care provider, we will assist you in finding one.

## 2021-07-03 NOTE — ED Provider Notes (Signed)
[facility]    CSN: [id number] Arrival date & time: 07/03/21  1427    HISTORY  No chief complaint on file.  HPI Breanna Thomas is a 40 y.o. female. Pt reports of having congestion, productive cough, sore throat and pain in chest from congestion/ cough that began 3 days ago.  Patient denies fever, aches, chills, nausea, vomiting, diarrhea.  Patient denies known sick contacts.  Patient's vital signs are normal on arrival today.  The history is provided by the patient.  Past Medical History:  Diagnosis Date   Anemia    Anxiety    Anxiety    Panic attacks    Vertigo    Patient Active Problem List   Diagnosis Date Noted   Major depressive disorder, recurrent episode, moderate with anxious distress ([place]) 03/24/2020   Nonspecific abnormal electrocardiogram (ECG) (EKG) 02/26/2019   Morbid obesity ([place]) 02/26/2019   Chest discomfort 02/26/2019   Erroneous encounter - disregard 01/25/2019   GERD (gastroesophageal reflux disease) 04/20/2016   Cough 04/20/2016   IFG (impaired fasting glucose) 12/30/2013   Unspecified vitamin D deficiency 12/30/2013   Back muscle spasm 12/15/2013   Anxiety 04/10/2013   Past Surgical History:  Procedure Laterality Date   NO PAST SURGERIES     OB History     Gravida  3   Para  3   Term      Preterm      AB      Living         SAB      IAB      Ectopic      Multiple      Live Births             Home Medications    Prior to Admission medications   Medication Sig Start Date End Date Taking? Authorizing Provider  sertraline (ZOLOFT) 100 MG tablet Take one pill once per day 05/01/21   Charlott Rakes, MD   Family History Family History  Adopted: Yes  Problem Relation Age of Onset   Diabetes Mother    Hypertension Mother    Heart disease Father    Seizures Father    Seizures Other    Heart failure Other    Diabetes Sister    Social History Social History   Tobacco Use   Smoking status: Never    Smokeless tobacco: Never  Vaping Use   Vaping Use: Never used  Substance Use Topics   Alcohol use: Not Currently    Alcohol/week: 1.0 standard drink    Types: 1 Glasses of wine per week    Comment: social   Drug use: No   Allergies   Patient has no known allergies.  Review of Systems Review of Systems Pertinent findings noted in history of present illness.   Physical Exam Triage Vital Signs ED Triage Vitals  Enc Vitals Group     BP 05/19/21 0827 (!) 147/82     Pulse Rate 05/19/21 0827 72     Resp 05/19/21 0827 18     Temp 05/19/21 0827 98.3 F (36.8 C)     Temp Source 05/19/21 0827 Oral     SpO2 05/19/21 0827 98 %     Weight --      Height --      Head Circumference --      Peak Flow --      Pain Score 05/19/21 0826 5     Pain Loc --  Pain Edu? --      Excl. in [place]? --   No data found.  Updated Vital Signs BP (!) 129/91 (BP Location: Right Arm)   Pulse 90   Temp 98.4 F (36.9 C) (Oral)   Resp 20   LMP 06/26/2021   SpO2 96%   Physical Exam Vitals and nursing note reviewed.  Constitutional:      General: She is not in acute distress.    Appearance: Normal appearance. She is not ill-appearing.  HENT:     Head: Normocephalic and atraumatic.     Salivary Glands: Right salivary gland is not diffusely enlarged or tender. Left salivary gland is not diffusely enlarged or tender.     Right Ear: Tympanic membrane, ear canal and external ear normal. No drainage. No middle ear effusion. There is no impacted cerumen. Tympanic membrane is not erythematous or bulging.     Left Ear: Tympanic membrane, ear canal and external ear normal. No drainage.  No middle ear effusion. There is no impacted cerumen. Tympanic membrane is not erythematous or bulging.     Nose: Mucosal edema, congestion and rhinorrhea present. No nasal deformity or septal deviation. Rhinorrhea is clear.     Right Turbinates: Enlarged and swollen. Not pale.     Left Turbinates: Enlarged and swollen. Not  pale.     Right Sinus: No maxillary sinus tenderness or frontal sinus tenderness.     Left Sinus: No maxillary sinus tenderness or frontal sinus tenderness.     Mouth/Throat:     Lips: Pink. No lesions.     Mouth: Mucous membranes are moist. No oral lesions.     Pharynx: Oropharynx is clear. Uvula midline. No posterior oropharyngeal erythema or uvula swelling.     Tonsils: No tonsillar exudate. 0 on the right. 0 on the left.  Eyes:     General: Lids are normal.        Right eye: No discharge.        Left eye: No discharge.     Extraocular Movements: Extraocular movements intact.     Conjunctiva/sclera: Conjunctivae normal.     Right eye: Right conjunctiva is not injected.     Left eye: Left conjunctiva is not injected.  Neck:     Trachea: Trachea and phonation normal.  Cardiovascular:     Rate and Rhythm: Normal rate and regular rhythm.     Pulses: Normal pulses.     Heart sounds: Normal heart sounds. No murmur heard.   No friction rub. No gallop.  Pulmonary:     Effort: Pulmonary effort is normal. No accessory muscle usage, prolonged expiration or respiratory distress.     Breath sounds: Normal breath sounds. No stridor, decreased air movement or transmitted upper airway sounds. No decreased breath sounds, wheezing, rhonchi or rales.  Chest:     Chest wall: No tenderness.  Musculoskeletal:        General: Normal range of motion.     Cervical back: Normal range of motion and neck supple. Normal range of motion.  Lymphadenopathy:     Cervical: No cervical adenopathy.  Skin:    General: Skin is warm and dry.     Findings: No erythema or rash.  Neurological:     General: No focal deficit present.     Mental Status: She is alert and oriented to person, place, and time.  Psychiatric:        Mood and Affect: Mood normal.  Behavior: Behavior normal.    Visual Acuity Right Eye Distance:   Left Eye Distance:   Bilateral Distance:    Right Eye Near:   Left Eye Near:     Bilateral Near:     UC Couse / Diagnostics / Procedures:    EKG  Radiology No results found.  Procedures Procedures (including critical care time)  UC Diagnoses / Final Clinical Impressions(s)   I have reviewed the triage vital signs and the nursing notes.  Pertinent labs & imaging results that were available during my care of the patient were reviewed by me and considered in my medical decision making (see chart for details).   Final diagnoses:  Viral upper respiratory infection  Nasal congestion  Rhinorrhea   Patient advised that given duration of symptoms, viral testing not indicated at this point.  Conservative care recommended.  Patient provided with a prescription for Atrovent, promethazine and ibuprofen.  ED Prescriptions     Medication Sig Dispense Auth. Provider   ibuprofen (ADVIL) 400 MG tablet Take 1 tablet (400 mg total) by mouth every 6 (six) hours as needed. 30 tablet Lynden Oxford Scales, PA-C   ipratropium (ATROVENT) 0.06 % nasal spray Place 2 sprays into both nostrils 4 (four) times daily. As needed for nasal congestion, runny nose 15 mL Lynden Oxford Scales, PA-C   promethazine-dextromethorphan (PROMETHAZINE-DM) 6.25-15 MG/5ML syrup Take 5 mLs by mouth 4 (four) times daily as needed for cough. 180 mL Lynden Oxford Scales, PA-C      PDMP not reviewed this encounter.  Pending results:  Labs Reviewed - No data to display  Medications Ordered in UC: Medications - No data to display  Disposition Upon Discharge:  Condition: stable for discharge home Home: take medications as prescribed; routine discharge instructions as discussed; follow up as advised.  Patient presented with an acute illness with associated systemic symptoms and significant discomfort requiring urgent management. In my opinion, this is a condition that a prudent lay person (someone who possesses an average knowledge of health and medicine) may potentially expect to result in  complications if not addressed urgently such as respiratory distress, impairment of bodily function or dysfunction of bodily organs.   Routine symptom specific, illness specific and/or disease specific instructions were discussed with the patient and/or caregiver at length.   As such, the patient has been evaluated and assessed, work-up was performed and treatment was provided in alignment with urgent care protocols and evidence based medicine.  Patient/parent/caregiver has been advised that the patient may require follow up for further testing and treatment if the symptoms continue in spite of treatment, as clinically indicated and appropriate.  The patient was tested for COVID-19, Influenza and/or RSV, then the patient/parent/guardian was advised to isolate at home pending the results of his/her diagnostic coronavirus test and potentially longer if they're positive. I have also advised pt that if his/her COVID-19 test returns positive, it's recommended to self-isolate for at least 10 days after symptoms first appeared AND until fever-free for 24 hours without fever reducer AND other symptoms have improved or resolved. Discussed self-isolation recommendations as well as instructions for household member/close contacts as per the [facility] and [place] [facility], and also gave patient the [place] packet with this information.  Patient/parent/caregiver has been advised to return to the [facility] or PCP in 3-5 days if no better; to PCP or the Emergency Department if new signs and symptoms develop, or if the current signs or symptoms continue to change or worsen for further workup, evaluation  and treatment as clinically indicated and appropriate  The patient will follow up with their current PCP if and as advised. If the patient does not currently have a PCP we will assist them in obtaining one.   The patient may need specialty follow up if the symptoms continue, in spite of conservative treatment and management, for further  workup, evaluation, consultation and treatment as clinically indicated and appropriate.  Patient/parent/caregiver verbalized understanding and agreement of plan as discussed.  All questions were addressed during visit.  Please see discharge instructions below for further details of plan.  Discharge Instructions:   Discharge Instructions      Your symptoms are most consistent with a viral upper respiratory illness.  Because you are outside the window of benefit for antiviral treatment, viral testing is not indicated at this time.  For congestion and runny nose, recommend that you begin using ipratropium nasal spray, 2 sprays in each nare up to 4 times daily as needed.    Please also begin ibuprofen 400 mg 3 times daily to reduce inflammation in the upper and lower airways.  The interaction to which you are referring between ibuprofen and sertraline includes slightly increased risk of upper GI bleeding.  At 400 mg 3 times daily for no more than 3 to 4 days, I do not anticipate this will be a problem for you.  For nighttime cough, please take 5 mL of Promethazine DM at bedtime, this is a mild decongestant, cough suppressant and excellent sleep medication.  Please remain home from work, school, public places until you have been fever free for 24 hours without the use of antifever medications such as Tylenol or ibuprofen.  Conservative care is recommended at this time.  This includes rest, pushing clear fluids and activity as tolerated.  You may also noticed that your appetite is reduced, this is okay as long as they are drinking plenty of clear fluids.  Acetaminophen (Tylenol): This is a good fever reducer.  If there body temperature rises above 101.5 as measured with a thermometer, it is recommended that you give them 1,000 mg every 6-8 hours until they are temperature falls below 101.5, please not take more than 3,000 mg of acetaminophen either as a separate medication or as in ingredient in an  over-the-counter cold/flu preparation within a 24-hour period  Ibuprofen  (Advil, Motrin): This is a good anti-inflammatory medication which addresses aches and pains and, to some degree, congestion in the nasal passages.  I recommend giving between 400 to 600 mg every 6-8 hours as needed.  Pseudoephedrine (Sudafed): This is a decongestant.  This medication has to be purchased from the pharmacist counter, I recommend giving 2 tablets, 60 mg, 2-3 times a day as needed to relieve runny nose and sinus drainage.  Guaifenesin (Robitussin, Mucinex): This is an expectorant.  This helps break up chest congestion and loosen up thick nasal drainage making phlegm and drainage more liquid and therefore easier to remove.  I recommend being 400 mg three times daily as needed.  Dextromethorphan (any cough medicine with the letters "DM" added to it's name such as Robitussin DM): This is a cough suppressant.  This is often recommended to be taken at nighttime to suppress cough and help children sleep.  Give dosage as directed on the bottle.   Chloraseptic Throat Spray: Spray 5 sprays into affected area every 2 hours, hold for 15 seconds and either swallow or spit it out.  This is a excellent numbing medication because it is  a spray, you can put it right where you needed and so sucking on a lozenge and numbing your entire mouth.  Based on my physical exam findings and the history provided  today, I do not see any evidence of bacterial infection therefore treatment with antibiotics would be of no benefit.  Please follow-up within the next 3 to 5 days either with your primary care provider or urgent care if your symptoms do not resolve.  If you do not have a primary care provider, we will assist you in finding one.         Lynden Oxford Scales, [place] 07/04/21 [phone]

## 2021-07-11 ENCOUNTER — Encounter: Payer: Medicaid Other | Attending: Family Medicine | Admitting: Skilled Nursing Facility1

## 2021-08-24 ENCOUNTER — Ambulatory Visit (HOSPITAL_BASED_OUTPATIENT_CLINIC_OR_DEPARTMENT_OTHER): Payer: Medicaid Other | Attending: Family Medicine | Admitting: Internal Medicine

## 2021-10-10 ENCOUNTER — Telehealth: Payer: Self-pay | Admitting: Family Medicine

## 2021-10-10 NOTE — Telephone Encounter (Signed)
Pt dropped off medical form to be signed. Inform pt that it could be 7-10 days, and would be called when completed. ?

## 2021-10-11 NOTE — Telephone Encounter (Signed)
Form has been received and patient will be called once ready for pick up. ?

## 2021-10-13 NOTE — Telephone Encounter (Signed)
Pt called and wants to speak to the clinic regarding this she has questions,  ? ?Best contact: [phone]  ?

## 2021-10-16 NOTE — Telephone Encounter (Signed)
Pt was called and VM was left informing patient that paperwork is ready for pick up. ?

## 2022-02-01 ENCOUNTER — Ambulatory Visit: Payer: Medicaid Other | Admitting: Physician Assistant

## 2022-02-26 ENCOUNTER — Ambulatory Visit
Admission: RE | Admit: 2022-02-26 | Discharge: 2022-02-26 | Disposition: A | Discharge status: Home / Self Care | Payer: Medicaid Other | Source: Ambulatory Visit | Attending: Family Medicine | Admitting: Family Medicine

## 2022-02-26 VITALS — BP 128/90 | HR 98 | Temp 98.1°F | Resp 18

## 2022-02-26 DIAGNOSIS — B349 Viral infection, unspecified: Secondary | ICD-10-CM | POA: Diagnosis not present

## 2022-02-26 LAB — POCT FASTING CBG KUC MANUAL ENTRY: POCT Glucose (KUC): 94 mg/dL (ref 70–99)

## 2022-02-26 NOTE — ED Provider Notes (Signed)
[facility]    CSN: [id number] Arrival date & time: 02/26/22  1007      History   Chief Complaint Chief Complaint  Patient presents with   Fatigue    Entered by patient    HPI Breanna Thomas is a 41 y.o. female.   HPI Here for a 2-day history of fatigue and malaise, chills, and headache.  The first day she did have some loose stools, but that is resolved.  No nausea or vomiting.  No nasal congestion or cough.  She wonders about her blood sugar since that has been borderline before  She did have COVID about 1 year ago, and symptoms were similar though she maybe had a little more congestion  Past Medical History:  Diagnosis Date   Anemia    Anxiety    Anxiety    Panic attacks    Vertigo     Patient Active Problem List   Diagnosis Date Noted   Major depressive disorder, recurrent episode, moderate with anxious distress ([place]) 03/24/2020   Nonspecific abnormal electrocardiogram (ECG) (EKG) 02/26/2019   Morbid obesity ([place]) 02/26/2019   Chest discomfort 02/26/2019   Erroneous encounter - disregard 01/25/2019   GERD (gastroesophageal reflux disease) 04/20/2016   Cough 04/20/2016   IFG (impaired fasting glucose) 12/30/2013   Unspecified vitamin D deficiency 12/30/2013   Back muscle spasm 12/15/2013   Anxiety 04/10/2013    Past Surgical History:  Procedure Laterality Date   NO PAST SURGERIES      OB History     Gravida  3   Para  3   Term      Preterm      AB      Living         SAB      IAB      Ectopic      Multiple      Live Births               Home Medications    Prior to Admission medications   Medication Sig Start Date End Date Taking? Authorizing Provider  ibuprofen (ADVIL) 400 MG tablet Take 1 tablet (400 mg total) by mouth every 6 (six) hours as needed. 07/03/21   Lynden Oxford Scales, PA-C  ipratropium (ATROVENT) 0.06 % nasal spray Place 2 sprays into both nostrils 4 (four) times daily. As needed for nasal  congestion, runny nose 07/03/21   Lynden Oxford Scales, PA-C  promethazine-dextromethorphan (PROMETHAZINE-DM) 6.25-15 MG/5ML syrup Take 5 mLs by mouth 4 (four) times daily as needed for cough. 07/03/21   Lynden Oxford Scales, PA-C  sertraline (ZOLOFT) 100 MG tablet Take one pill once per day 05/01/21   Charlott Rakes, MD    Family History Family History  Adopted: Yes  Problem Relation Age of Onset   Diabetes Mother    Hypertension Mother    Heart disease Father    Seizures Father    Seizures Other    Heart failure Other    Diabetes Sister     Social History Social History   Tobacco Use   Smoking status: Never   Smokeless tobacco: Never  Vaping Use   Vaping Use: Never used  Substance Use Topics   Alcohol use: Not Currently    Alcohol/week: 1.0 standard drink of alcohol    Types: 1 Glasses of wine per week    Comment: social   Drug use: No     Allergies   Patient has no known allergies.  Review of Systems Review of Systems   Physical Exam Triage Vital Signs ED Triage Vitals [02/26/22 1046]  Enc Vitals Group     BP (!) 128/90     Pulse Rate 98     Resp 18     Temp 98.1 F (36.7 C)     Temp Source Oral     SpO2 97 %     Weight      Height      Head Circumference      Peak Flow      Pain Score      Pain Loc      Pain Edu?      Excl. in [place]?    No data found.  Updated Vital Signs BP (!) 128/90 (BP Location: Right Arm)   Pulse 98   Temp 98.1 F (36.7 C) (Oral)   Resp 18   LMP 01/30/2022 (Approximate)   SpO2 97%   Visual Acuity Right Eye Distance:   Left Eye Distance:   Bilateral Distance:    Right Eye Near:   Left Eye Near:    Bilateral Near:     Physical Exam Vitals reviewed.  Constitutional:      General: She is not in acute distress.    Appearance: She is not toxic-appearing.  HENT:     Right Ear: Tympanic membrane and ear canal normal.     Left Ear: Tympanic membrane and ear canal normal.     Nose: Nose normal.      Mouth/Throat:     Mouth: Mucous membranes are moist.     Pharynx: No oropharyngeal exudate or posterior oropharyngeal erythema.  Eyes:     Extraocular Movements: Extraocular movements intact.     Conjunctiva/sclera: Conjunctivae normal.     Pupils: Pupils are equal, round, and reactive to light.  Cardiovascular:     Rate and Rhythm: Normal rate and regular rhythm.     Heart sounds: No murmur heard. Pulmonary:     Effort: Pulmonary effort is normal. No respiratory distress.     Breath sounds: No stridor. No wheezing, rhonchi or rales.  Musculoskeletal:     Cervical back: Neck supple.  Lymphadenopathy:     Cervical: No cervical adenopathy.  Skin:    Capillary Refill: Capillary refill takes less than 2 seconds.     Coloration: Skin is not jaundiced or pale.  Neurological:     General: No focal deficit present.     Mental Status: She is alert and oriented to person, place, and time.  Psychiatric:        Behavior: Behavior normal.      UC Treatments / Results  Labs (all labs ordered are listed, but only abnormal results are displayed) Labs Reviewed  NOVEL CORONAVIRUS, NAA  POCT FASTING [facility]    EKG   Radiology No results found.  Procedures Procedures (including critical care time)  Medications Ordered in UC Medications - No data to display  Initial Impression / Assessment and Plan / UC Course  I have reviewed the triage vital signs and the nursing notes.  Pertinent labs & imaging results that were available during my care of the patient were reviewed by me and considered in my medical decision making (see chart for details).     Fingerstick glucose is 94.  COVID swab is done, and if she is positive she would be a candidate for Paxlovid Final Clinical Impressions(s) / UC Diagnoses   Final diagnoses:  Viral illness  Discharge Instructions      Sugar was 94 which is normal   You have been swabbed for COVID, and the test will result in  the next 24 hours. Our staff will call you if positive. If the test is positive, you should quarantine for 5 days from the start of your symptoms  It is still possible this is some other viral illness.  Take Tylenol or Advil as she needed for any aches or pain.  Make sure you are drinking enough fluids and rest     ED Prescriptions   None    PDMP not reviewed this encounter.   Barrett Henle, MD 02/26/22 [id number]

## 2022-02-26 NOTE — ED Triage Notes (Signed)
The pt c/o fatigue, chills, headache, and dirrhea that began 2 days ago\.

## 2022-02-26 NOTE — Discharge Instructions (Addendum)
Sugar was 94 which is normal   You have been swabbed for COVID, and the test will result in the next 24 hours. Our staff will call you if positive. If the test is positive, you should quarantine for 5 days from the start of your symptoms  It is still possible this is some other viral illness.  Take Tylenol or Advil as she needed for any aches or pain.  Make sure you are drinking enough fluids and rest

## 2022-02-28 LAB — NOVEL CORONAVIRUS, NAA: SARS-CoV-2, NAA: NOT DETECTED

## 2022-04-20 ENCOUNTER — Ambulatory Visit
Admission: EM | Admit: 2022-04-20 | Discharge: 2022-04-20 | Disposition: A | Discharge status: Home / Self Care | Payer: Medicaid Other | Attending: Urgent Care | Admitting: Urgent Care

## 2022-04-20 DIAGNOSIS — R3915 Urgency of urination: Secondary | ICD-10-CM

## 2022-04-20 DIAGNOSIS — R350 Frequency of micturition: Secondary | ICD-10-CM | POA: Diagnosis not present

## 2022-04-20 LAB — POCT URINALYSIS DIP (MANUAL ENTRY)
Bilirubin, UA: NEGATIVE
Blood, UA: NEGATIVE
Glucose, UA: NEGATIVE mg/dL
Ketones, POC UA: NEGATIVE mg/dL
Leukocytes, UA: NEGATIVE
Nitrite, UA: NEGATIVE
Protein Ur, POC: NEGATIVE mg/dL
Spec Grav, UA: >=1.03 — AB (ref 1.010–1.025)
Urobilinogen, UA: 0.2 E.U./dL
pH, UA: 5.5 (ref 5.0–8.0)

## 2022-04-20 LAB — POCT URINE PREGNANCY: Preg Test, Ur: NEGATIVE

## 2022-04-20 NOTE — Discharge Instructions (Addendum)
Make sure you hydrate very well with plain water and a quantity of 80 ounces of water a day.  Please limit drinks that are considered urinary irritants such as soda, sweet tea, coffee, energy drinks, alcohol.  These can worsen your urinary and genital symptoms but also be the source of them.  I will let you know about your urine culture results through MyChart to see if we need to prescribe or change your antibiotics based off of those results.

## 2022-04-20 NOTE — ED Provider Notes (Signed)
  [name]  Note:  This document was prepared using Systems analyst and may include unintentional dictation errors.  MRN: [id number] DOB: Feb 19, 1981  Subjective:   Breanna Thomas is a 41 y.o. female presenting for 1 week history of persistent urinary frequency and urgency. No water, drinks coffee, sweet tea. Drinks alcohol rarely. Denies fever, n/v, abdominal pain, pelvic pain, rashes, dysuria, urinary frequency, hematuria, vaginal discharge.    No current facility-administered medications for this encounter.  Current Outpatient Medications:    sertraline (ZOLOFT) 100 MG tablet, Take one pill once per day, Disp: 90 tablet, Rfl: 1   ipratropium (ATROVENT) 0.06 % nasal spray, Place 2 sprays into both nostrils 4 (four) times daily. As needed for nasal congestion, runny nose, Disp: 15 mL, Rfl: 0   No Known Allergies  Past Medical History:  Diagnosis Date   Anemia    Anxiety    Anxiety    Panic attacks    Vertigo      Past Surgical History:  Procedure Laterality Date   NO PAST SURGERIES      Family History  Adopted: Yes  Problem Relation Age of Onset   Diabetes Mother    Hypertension Mother    Heart disease Father    Seizures Father    Seizures Other    Heart failure Other    Diabetes Sister     Social History   Tobacco Use   Smoking status: Never   Smokeless tobacco: Never  Vaping Use   Vaping Use: Never used  Substance Use Topics   Alcohol use: Not Currently    Alcohol/week: 1.0 standard drink of alcohol    Types: 1 Glasses of wine per week    Comment: social   Drug use: No    ROS   Objective:   Vitals: BP 120/86 (BP Location: Right Arm)   Pulse 86   Temp 98.4 F (36.9 C) (Oral)   Resp 18   LMP 03/26/2022   SpO2 96%   Physical Exam  Results for orders placed or performed during the hospital encounter of 04/20/22 (from the past 24 hour(s))  POCT urine pregnancy     Status: None   Collection Time:  04/20/22  5:27 PM  Result Value Ref Range   Preg Test, Ur Negative Negative  POCT urinalysis dipstick     Status: Abnormal   Collection Time: 04/20/22  5:27 PM  Result Value Ref Range   Color, UA yellow yellow   Clarity, UA clear clear   Glucose, UA negative negative mg/dL   Bilirubin, UA negative negative   Ketones, POC UA negative negative mg/dL   Spec Grav, UA >=1.030 (A) 1.010 - 1.025   Blood, UA negative negative   pH, UA 5.5 5.0 - 8.0   Protein Ur, POC negative negative mg/dL   Urobilinogen, UA 0.2 0.2 or 1.0 E.U./dL   Nitrite, UA Negative Negative   Leukocytes, UA Negative Negative    Assessment and Plan :   PDMP not reviewed this encounter.  No diagnosis found.

## 2022-04-20 NOTE — ED Triage Notes (Signed)
Pt reports that she feels like she like she has a UTI. States that when she finishes urinating she feels like she needs to go again. Sx greater than 1 week.

## 2022-04-22 LAB — URINE CULTURE: Culture: NO GROWTH

## 2022-04-24 ENCOUNTER — Ambulatory Visit: Payer: Self-pay | Admitting: *Deleted

## 2022-04-24 NOTE — Telephone Encounter (Signed)
  Chief Complaint: urinary frequency  Symptoms: urinary frequency, at times unable to urinate. Feels like needs to urinate but does not go. Denies abdominal pressure or pain. Reports pain in back at times. Has been seen in UC 04/20/22 no UTI . Frequency: 3 weeks  Pertinent Negatives: Patient denies fever no odor, no dark colored urine. No Burning with urination Disposition: '[]'$ ED /'[x]'$ Urgent Care (no appt availability in office) / '[]'$ Appointment(In office/virtual)/ '[]'$  [facility]/ '[]'$ Home Care/ '[]'$ Refused Recommended Disposition /'[]'$ [facility]/ '[x]'$  Follow-up with PCP Additional Notes:   Last seen UC 04/20/22 no UTI. No available appt within 24 hours. Please advise.     Reason for Disposition  Urinating more frequently than usual (i.e., frequency)  Answer Assessment - Initial Assessment Questions 1. SYMPTOM: "What's the main symptom you're concerned about?" (e.g., frequency, incontinence)     Frequency  2. ONSET: "When did the  frequency   start?"     3 weeks ago  3. PAIN: "Is there any pain?" If Yes, ask: "How bad is it?" (Scale: 1-10; mild, moderate, severe)     No pain with urination 4. CAUSE: "What do you think is causing the symptoms?"     Not sure seen in UC 04/20/22 no UTI 5. OTHER SYMPTOMS: "Do you have any other symptoms?" (e.g., blood in urine, fever, flank pain, pain with urination)     Pain in back at times , urinary frequency and unable to urinate at times  6. PREGNANCY: "Is there any chance you are pregnant?" "When was your last menstrual period?"     na  Protocols used: Urinary Symptoms-A-AH

## 2022-04-26 NOTE — Telephone Encounter (Signed)
Patient to f/u with [facility]. No openings at [facility].

## 2022-04-27 ENCOUNTER — Encounter (HOSPITAL_COMMUNITY): Payer: Self-pay

## 2022-04-27 ENCOUNTER — Emergency Department (HOSPITAL_COMMUNITY)
Admission: EM | Admit: 2022-04-27 | Discharge: 2022-04-27 | Disposition: A | Discharge status: Home / Self Care | Payer: Medicaid Other | Attending: Emergency Medicine | Admitting: Emergency Medicine

## 2022-04-27 ENCOUNTER — Other Ambulatory Visit: Payer: Self-pay

## 2022-04-27 ENCOUNTER — Ambulatory Visit (INDEPENDENT_AMBULATORY_CARE_PROVIDER_SITE_OTHER): Payer: Medicaid Other | Admitting: Primary Care

## 2022-04-27 DIAGNOSIS — R350 Frequency of micturition: Secondary | ICD-10-CM | POA: Diagnosis not present

## 2022-04-27 DIAGNOSIS — R102 Pelvic and perineal pain: Secondary | ICD-10-CM | POA: Diagnosis present

## 2022-04-27 DIAGNOSIS — R110 Nausea: Secondary | ICD-10-CM | POA: Diagnosis not present

## 2022-04-27 DIAGNOSIS — N390 Urinary tract infection, site not specified: Secondary | ICD-10-CM | POA: Diagnosis not present

## 2022-04-27 LAB — CBC
HCT: 41.3 % (ref 36.0–46.0)
Hemoglobin: 12.3 g/dL (ref 12.0–15.0)
MCH: 28.1 pg (ref 26.0–34.0)
MCHC: 29.8 g/dL — ABNORMAL LOW (ref 30.0–36.0)
MCV: 94.5 fL (ref 80.0–100.0)
Platelets: 112 10*3/uL — ABNORMAL LOW (ref 150–400)
RBC: 4.37 MIL/uL (ref 3.87–5.11)
RDW: 12.6 % (ref 11.5–15.5)
WBC: 7.9 10*3/uL (ref 4.0–10.5)
nRBC: 0 % (ref 0.0–0.2)

## 2022-04-27 LAB — URINALYSIS, ROUTINE W REFLEX MICROSCOPIC
Bilirubin Urine: NEGATIVE
Glucose, UA: NEGATIVE mg/dL
Hgb urine dipstick: NEGATIVE
Ketones, ur: NEGATIVE mg/dL
Nitrite: NEGATIVE
Protein, ur: NEGATIVE mg/dL
Specific Gravity, Urine: 1.004 — ABNORMAL LOW (ref 1.005–1.030)
pH: 6 (ref 5.0–8.0)

## 2022-04-27 LAB — BASIC METABOLIC PANEL
Anion gap: 12 (ref 5–15)
BUN: 9 mg/dL (ref 6–20)
CO2: 23 mmol/L (ref 22–32)
Calcium: 9.5 mg/dL (ref 8.9–10.3)
Chloride: 104 mmol/L (ref 98–111)
Creatinine, Ser: 0.87 mg/dL (ref 0.44–1.00)
GFR, Estimated: >60 mL/min (ref >60)
Glucose, Bld: 87 mg/dL (ref 70–99)
Potassium: 5.2 mmol/L — ABNORMAL HIGH (ref 3.5–5.1)
Sodium: 139 mmol/L (ref 135–145)

## 2022-04-27 LAB — I-STAT BETA HCG BLOOD, ED (MC, WL, AP ONLY): I-stat hCG, quantitative: <5 m[IU]/mL (ref <5)

## 2022-04-27 MED ORDER — CEPHALEXIN 500 MG PO CAPS: 500.0000 mg | ORAL_CAPSULE | Freq: Two times a day (BID) | ORAL | 0 refills | Status: AC

## 2022-04-27 NOTE — ED Notes (Signed)
Patient verbalizes understanding of discharge instructions. Opportunity for questioning and answers were provided. Armband removed by staff, pt discharged from ED ambulatory.   

## 2022-04-27 NOTE — ED Notes (Signed)
Bladder scan resulted with 115 mls.

## 2022-04-27 NOTE — ED Provider Notes (Signed)
[facility] EMERGENCY DEPARTMENT Provider Note   CSN: [id number] Arrival date & time: 04/27/22  1115     History  Chief Complaint  Patient presents with   Urinary Tract Infection   Pelvic Pain    Breanna Thomas is a 41 y.o. female.   Urinary Tract Infection Pelvic Pain   Patient presents emergency department today due to concerns for increased urinary frequency and urinary retention.  Per the patient, the patient had increased urinary frequency and sensation that she cannot obtain her bladder for the past 3 weeks.  Patient states that she felt she had a urinary tract infection and therefore waited to be evaluated because she had been passed on stone.  Patient went to an urgent care where she had a urinalysis done patient was not found to have a urinary tract infection.  At that point time patient was discharged with no medications.  Patient presented to her PCP today due to continuing urinary frequency and sensation of urinary fullness.  Patient also endorsed that she has had suprapubic abdominal pain as well as intermittent episodes of right flank pain.  Patient's PCP sent her to the emergency department today due to concern for urinary retention.  Patient denies any back pain, trauma to her back, fevers, chills, weight loss, vomiting, diarrhea, chest pain, shortness of breath.  Patient Dors is that she has had intermittent episodes of nausea.  States that this feels similar to past UTIs.  She is able to urinate but does not feel that she fully empties her bladder.    Home Medications Prior to Admission medications   Medication Sig Start Date End Date Taking? Authorizing Provider  cephALEXin (KEFLEX) 500 MG capsule Take 1 capsule (500 mg total) by mouth 2 (two) times daily for 5 days. 04/27/22 05/02/22 Yes Willet Schleifer, [place], MD  sertraline (ZOLOFT) 100 MG tablet Take one pill once per day Patient taking differently: Take 100 mg by mouth daily. 05/01/21  Yes Newlin,  Charlane Ferretti, MD  ipratropium (ATROVENT) 0.06 % nasal spray Place 2 sprays into both nostrils 4 (four) times daily. As needed for nasal congestion, runny nose Patient not taking: Reported on 04/27/2022 07/03/21   Lynden Oxford Scales, PA-C      Allergies    Patient has no known allergies.    Review of Systems   Review of Systems  Genitourinary:  Positive for pelvic pain.    Physical Exam Updated Vital Signs BP 99/64 (BP Location: Right Arm)   Pulse 85   Temp 98.1 F (36.7 C) (Oral)   Resp 19   Ht '5\' 4"'$  (1.626 m)   Wt 130.2 kg   LMP 03/26/2022   SpO2 100%   BMI 49.26 kg/m  Physical Exam Vitals and nursing note reviewed.  Constitutional:      General: She is not in acute distress.    Appearance: She is well-developed. She is not ill-appearing.  HENT:     Head: Normocephalic and atraumatic.  Eyes:     Conjunctiva/sclera: Conjunctivae normal.  Cardiovascular:     Rate and Rhythm: Normal rate and regular rhythm.     Heart sounds: No murmur heard. Pulmonary:     Effort: Pulmonary effort is normal. No respiratory distress.     Breath sounds: Normal breath sounds.  Abdominal:     Palpations: Abdomen is soft.     Tenderness: There is abdominal tenderness. There is no right CVA tenderness or left CVA tenderness.     Comments: Suprapubic tenderness  to palpation   Musculoskeletal:        General: No swelling.     Cervical back: Neck supple.  Skin:    General: Skin is warm and dry.     Capillary Refill: Capillary refill takes less than 2 seconds.  Neurological:     General: No focal deficit present.     Mental Status: She is alert and oriented to person, place, and time.  Psychiatric:        Mood and Affect: Mood normal.     ED Results / Procedures / Treatments   Labs (all labs ordered are listed, but only abnormal results are displayed) Labs Reviewed  URINALYSIS, ROUTINE W REFLEX MICROSCOPIC - Abnormal; Notable for the following components:      Result Value   Color,  Urine STRAW (*)    Specific Gravity, Urine 1.004 (*)    Leukocytes,Ua SMALL (*)    Bacteria, UA MANY (*)    All other components within normal limits  CBC - Abnormal; Notable for the following components:   MCHC 29.8 (*)    Platelets 112 (*)    All other components within normal limits  BASIC METABOLIC PANEL - Abnormal; Notable for the following components:   Potassium 5.2 (*)    All other components within normal limits  I-STAT BETA HCG BLOOD, ED ([facility], [facility], [facility] ONLY)    EKG None  Radiology No results found.  Procedures Procedures    Medications Ordered in ED Medications - No data to display  ED Course/ Medical Decision Making/ A&P                          Medical Decision Making Amount and/or Complexity of Data Reviewed Labs: ordered. Decision-making details documented in ED Course.  Risk Prescription drug management.   Medical Decision Making  This patient is Presenting for Evaluation of urinary urgency, patient has a past medical history history of UTI which complicates their presentation.  Of which does require a range of treatment options, and is a complaint that involves a moderate risk of morbidity and mortality.  Arrived in ED by: POV  History obtained from: The patient  Limitations in history: none    At this time I am most concerned for urinary tract infection. Also considering pyelonephritis, urethritis, STI. Plan for laboratory and imaging study work-up   Laboratory work-up significant for:  -Patient's laboratory work-up revealed no significant leukocytosis, no additional hematologic abnormalities. BMP revealed elevated potassium however there is marked hemolysis, no additional significant metabolic abnormalities.  Creatinine at baseline.  Urinalysis with many bacteria, positive leukocyte esterase, no additional abnormalities.   Interventions and Interval History: -Patient is generally well-appearing on initial exam, patient has no vital changes, she  has no systemic signs of infection, she has no significant abdominal pain or flank pain, below who insert at this point time for systemic bacteremia, pyelonephritis, nephrolithiasis.  Patient's presentation consistent with that of cystitis.  Patient has a history of cystitis notes that her symptoms today are similar to her prior episodes of urinary tract infection.  Patient also has moderate bacteria on her urinalysis symptoms consistent with that of a UTI.  Patient concerned that she was retaining urine, patient bladder scan revealed only 114 mL of urine in her bladder, which is also consistent with the urinary urgency to go along with UTI.  Low clinical concern at this point For structural abnormality, do not feel that additional laboratory imaging study  work-up is clinically indicated at this time.  Patient will be treated empirically with Keflex instructed to follow-up with her primary care doctor if symptoms persist.    Additional documents reviewed: Clinic note    Disposition: Due to the patients current presenting symptoms, physical exam findings, and the workup stated above, it is thought that the etiology of the patients current presentation is uncomplicated urinary tract infection  Discharge: Patient is felt to be medically appropriate for discharge at this time. Patient was informed of all pertinent physical exam, laboratory, and imaging findings. Patients suspected etiology of their symptom presentation was discussed with the patient and all questions were answered. Patient was instructed to follow up with their primary care doctor in 7 days for re-evaluation. Patient was given strict return precautions.    The plan for this patient was discussed with Dr. Ashok Cordia, who voiced agreement and who oversaw evaluation and treatment of this patient.     Clinical Complexity  A medically appropriate history, review of systems, and physical exam was performed.   I personally reviewed the lab  and imaging studies discussed above.   MDM generated using voice dictation software and may contain dictation errors. Please contact me for any clarification or with any questions.              Final Clinical Impression(s) / ED Diagnoses Final diagnoses:  Lower urinary tract infectious disease    Rx / DC Orders ED Discharge Orders          Ordered    cephALEXin (KEFLEX) 500 MG capsule  2 times daily        04/27/22 2238              Kaylinn Dedic, [place], MD 04/28/22 0762    Lajean Saver, MD 05/03/22 1431

## 2022-04-27 NOTE — Progress Notes (Signed)
Patient presented with issues of urinary retention for 4 weeks pelvic pain feels like pressure.  She denies dysuria and hematuria.  Recently seen at [facility]  negative for UTI. Requesting to be done unable to I/o cath- Referred to ED for evaluation.

## 2022-04-27 NOTE — ED Triage Notes (Signed)
Patient has been urinary symptoms x 3 weeks. Went to PCP and sent her here.  Reports frequency urgency, burning suprapubic pain and lower back pain.  Denies fevers

## 2022-04-27 NOTE — ED Provider Triage Note (Signed)
Emergency Medicine Provider Triage Evaluation Note  Breanna Thomas , a 41 y.o. female  was evaluated in triage.  Pt complains of worsening urinary symptoms over the last 3 to 4 weeks.  Include dysuria, increased frequency, and incomplete emptying.  Was recommended by her PCP to come to the ED for further evaluation.  Was told her urinalysis showed "no evidence of UTI, however showed possible dehydration".  Patient expresses confusion over this.  Denies fever, chills, chest pain, shortness of breath, blood in the urine or stool.  Denies vaginal discharge, pain, or bleeding.  Denies possibility of pregnancy.  Endorses 1-2 episodes of diarrhea as well.  Review of Systems  Positive:  Negative: See above  Physical Exam  BP 131/89 (BP Location: Right Arm)   Pulse 81   Temp 98.8 F (37.1 C) (Oral)   Resp 18   Ht '5\' 4"'$  (1.626 m)   Wt 130.2 kg   LMP 03/26/2022   SpO2 100%   BMI 49.26 kg/m  Gen:   Awake, no distress   Resp:  Normal effort  MSK:   Moves extremities without difficulty  Other:  Mild abdominal tenderness, soft  Medical Decision Making  Medically screening exam initiated at 12:36 PM.  Appropriate orders placed.  Breanna Thomas was informed that the remainder of the evaluation will be completed by another provider, this initial triage assessment does not replace that evaluation, and the importance of remaining in the ED until their evaluation is complete.     Prince Rome, PA-C 53/[id number]/67 [id number]

## 2022-04-27 NOTE — Discharge Instructions (Addendum)
Return to the emergency department if you develop any new or worsening symptoms, persistent fevers, worsening back pain, nausea, vomiting, worsening abdominal pain or any other concerning symptoms.  Please follow-up with your primary care doctor within the next week if you have continuing symptoms.  Please take the antibiotic, Keflex, as prescribed.  You can take over the counter AZO to help with your symptoms

## 2022-05-19 ENCOUNTER — Ambulatory Visit
Admission: EM | Admit: 2022-05-19 | Discharge: 2022-05-19 | Disposition: A | Discharge status: Home / Self Care | Payer: Medicaid Other | Attending: Physician Assistant | Admitting: Physician Assistant

## 2022-05-19 DIAGNOSIS — Z1152 Encounter for screening for COVID-19: Secondary | ICD-10-CM | POA: Diagnosis not present

## 2022-05-19 DIAGNOSIS — J069 Acute upper respiratory infection, unspecified: Secondary | ICD-10-CM | POA: Diagnosis not present

## 2022-05-19 LAB — RESP PANEL BY RT-PCR (FLU A&B, COVID) ARPGX2
Influenza A by PCR: NEGATIVE
Influenza B by PCR: NEGATIVE
SARS Coronavirus 2 by RT PCR: NEGATIVE

## 2022-05-19 NOTE — ED Provider Notes (Signed)
[facility]    CSN: [id number] Arrival date & time: 05/19/22  0951      History   Chief Complaint Chief Complaint  Patient presents with   URI    HPI Breanna Thomas is a 41 y.o. female.   Patient here today for evaluation of cough, sore throat, nasal drainage and congestion that started 2 days ago.  She reports she has had body aches as well.  She has not checked her temperature.  She has been taking Tylenol with mild relief.  She notes multiple coworkers have had [place] recently.  The history is provided by the patient.  URI Presenting symptoms: congestion, cough and sore throat   Presenting symptoms: no ear pain and no fever   Associated symptoms: myalgias   Associated symptoms: no wheezing     Past Medical History:  Diagnosis Date   Anemia    Anxiety    Anxiety    Panic attacks    Vertigo     Patient Active Problem List   Diagnosis Date Noted   Major depressive disorder, recurrent episode, moderate with anxious distress ([place]) 03/24/2020   Nonspecific abnormal electrocardiogram (ECG) (EKG) 02/26/2019   Morbid obesity ([place]) 02/26/2019   Chest discomfort 02/26/2019   Erroneous encounter - disregard 01/25/2019   GERD (gastroesophageal reflux disease) 04/20/2016   Cough 04/20/2016   IFG (impaired fasting glucose) 12/30/2013   Unspecified vitamin D deficiency 12/30/2013   Back muscle spasm 12/15/2013   Anxiety 04/10/2013    Past Surgical History:  Procedure Laterality Date   NO PAST SURGERIES      OB History     Gravida  3   Para  3   Term      Preterm      AB      Living         SAB      IAB      Ectopic      Multiple      Live Births               Home Medications    Prior to Admission medications   Medication Sig Start Date End Date Taking? Authorizing Provider  ipratropium (ATROVENT) 0.06 % nasal spray Place 2 sprays into both nostrils 4 (four) times daily. As needed for nasal congestion, runny  nose Patient not taking: Reported on 04/27/2022 07/03/21   Lynden Oxford Scales, PA-C  sertraline (ZOLOFT) 100 MG tablet Take one pill once per day Patient taking differently: Take 100 mg by mouth daily. 05/01/21   Charlott Rakes, MD    Family History Family History  Adopted: Yes  Problem Relation Age of Onset   Diabetes Mother    Hypertension Mother    Heart disease Father    Seizures Father    Seizures Other    Heart failure Other    Diabetes Sister     Social History Social History   Tobacco Use   Smoking status: Never   Smokeless tobacco: Never  Vaping Use   Vaping Use: Never used  Substance Use Topics   Alcohol use: Not Currently    Alcohol/week: 1.0 standard drink of alcohol    Types: 1 Glasses of wine per week    Comment: social   Drug use: No     Allergies   Patient has no known allergies.   Review of Systems Review of Systems  Constitutional:  Negative for chills and fever.  HENT:  Positive for congestion  and sore throat. Negative for ear pain.   Eyes:  Negative for discharge and redness.  Respiratory:  Positive for cough. Negative for shortness of breath and wheezing.   Gastrointestinal:  Negative for abdominal pain, diarrhea, nausea and vomiting.  Musculoskeletal:  Positive for myalgias.     Physical Exam Triage Vital Signs ED Triage Vitals  Enc Vitals Group     BP 05/19/22 1024 130/85     Pulse Rate 05/19/22 1024 92     Resp 05/19/22 1024 18     Temp 05/19/22 1024 98.4 F (36.9 C)     Temp Source 05/19/22 1024 Oral     SpO2 05/19/22 1024 95 %     Weight --      Height --      Head Circumference --      Peak Flow --      Pain Score 05/19/22 1027 5     Pain Loc --      Pain Edu? --      Excl. in [place]? --    No data found.  Updated Vital Signs BP 130/85 (BP Location: Right Arm)   Pulse 92   Temp 98.4 F (36.9 C) (Oral)   Resp 18   LMP  (LMP Unknown)   SpO2 95%      Physical Exam Vitals and nursing note reviewed.   Constitutional:      General: She is not in acute distress.    Appearance: Normal appearance. She is not ill-appearing.  HENT:     Head: Normocephalic and atraumatic.     Nose: Congestion present.     Mouth/Throat:     Mouth: Mucous membranes are moist.     Pharynx: No oropharyngeal exudate or posterior oropharyngeal erythema.  Eyes:     Conjunctiva/sclera: Conjunctivae normal.  Cardiovascular:     Rate and Rhythm: Normal rate and regular rhythm.     Heart sounds: Normal heart sounds. No murmur heard. Pulmonary:     Effort: Pulmonary effort is normal. No respiratory distress.     Breath sounds: Normal breath sounds. No wheezing, rhonchi or rales.  Skin:    General: Skin is warm and dry.  Neurological:     Mental Status: She is alert.  Psychiatric:        Mood and Affect: Mood normal.        Thought Content: Thought content normal.      UC Treatments / Results  Labs (all labs ordered are listed, but only abnormal results are displayed) Labs Reviewed  RESP PANEL BY RT-PCR (FLU A&B, COVID) [id number]    EKG   Radiology No results found.  Procedures Procedures (including critical care time)  Medications Ordered in UC Medications - No data to display  Initial Impression / Assessment and Plan / UC Course  I have reviewed the triage vital signs and the nursing notes.  Pertinent labs & imaging results that were available during my care of the patient were reviewed by me and considered in my medical decision making (see chart for details).   We will screen for COVID and flu.  Recommended continued symptomatic treatment, increase fluids and rest while awaiting results.  Encouraged follow-up with any further concerns.   Final Clinical Impressions(s) / UC Diagnoses   Final diagnoses:  Acute upper respiratory infection  Encounter for screening for COVID-19   Discharge Instructions   None    ED Prescriptions   None    PDMP not reviewed this encounter.  Francene Finders, PA-C 05/19/22 1106

## 2022-05-19 NOTE — ED Triage Notes (Signed)
Pt presents with cough, sore throat, nasal drainage, and generalized body aches X 2 days.

## 2022-05-24 ENCOUNTER — Ambulatory Visit: Payer: Medicaid Other | Attending: Primary Care | Admitting: Family Medicine

## 2022-05-24 ENCOUNTER — Encounter: Payer: Self-pay | Admitting: Family Medicine

## 2022-05-24 ENCOUNTER — Other Ambulatory Visit: Payer: Self-pay | Admitting: Family Medicine

## 2022-05-24 VITALS — BP 128/88 | HR 93 | Temp 98.4°F | Ht 64.0 in | Wt 281.4 lb

## 2022-05-24 DIAGNOSIS — J0110 Acute frontal sinusitis, unspecified: Secondary | ICD-10-CM

## 2022-05-24 DIAGNOSIS — F411 Generalized anxiety disorder: Secondary | ICD-10-CM | POA: Diagnosis not present

## 2022-05-24 MED ORDER — HYDROXYZINE HCL 25 MG PO TABS: 25.0000 mg | ORAL_TABLET | Freq: Three times a day (TID) | ORAL | 1 refills | Status: DC | PRN

## 2022-05-24 MED ORDER — FLUTICASONE PROPIONATE 50 MCG/ACT NA SUSP: 2.0000 | Freq: Every day | NASAL | 1 refills | Status: DC

## 2022-05-24 NOTE — Patient Instructions (Signed)

## 2022-05-24 NOTE — Progress Notes (Signed)
Cough and stuffy nose.

## 2022-05-24 NOTE — Progress Notes (Signed)
Subjective:  Patient ID: Breanna Thomas, female    DOB: April 06, 1981  Age: 41 y.o. MRN: [id number]  CC: Cough   HPI Breanna Thomas is a 41 y.o. year old female with a history of generalized anxiety disorder, obesity here for an acute visit.  Interval History: Symptoms have been present from 05/18/30. Seen for acute upper respiratory infection at the ED on 05/19/2022.  COVID and flu test were negative and she was advised to take Mucinex. Since her visit she has started to notice she is coughing up green stuff, she has abnormal taste and speech is nasal. She has no sinus pressure, litlte headache, denies presence of myalgia, dyspnea or wheezing. She has no dyspnea or wheezing.  Sometimes she forgets to take her Zoloft for several months and then feels bad after which she will then restart the Zoloft.  She takes this medication for anxiety and is wondering if she can take it as needed.  States she feels okay emotionally at the moment. Past Medical History:  Diagnosis Date   Anemia    Anxiety    Anxiety    Panic attacks    Vertigo     Past Surgical History:  Procedure Laterality Date   NO PAST SURGERIES      Family History  Adopted: Yes  Problem Relation Age of Onset   Diabetes Mother    Hypertension Mother    Heart disease Father    Seizures Father    Seizures Other    Heart failure Other    Diabetes Sister     Social History   Socioeconomic History   Marital status: Divorced    Spouse name: Not on file   Number of children: 3   Years of education: College   Highest education level: Not on file  Occupational History    Employer: [employer]    Comment: [employer]  Tobacco Use   Smoking status: Never   Smokeless tobacco: Never  Vaping Use   Vaping Use: Never used  Substance and Sexual Activity   Alcohol use: Not Currently    Alcohol/week: 1.0 standard drink of alcohol    Types: 1 Glasses of wine per week    Comment: social   Drug use: No    Sexual activity: Not Currently    Birth control/protection: Implant  Other Topics Concern   Not on file  Social History Narrative   Patient lives at home alone.   Caffeine Use: none in the past 2 weeks, soda occasionally   Social Determinants of Health   Financial Resource Strain: Not on file  Food Insecurity: Not on file  Transportation Needs: Not on file  Physical Activity: Not on file  Stress: Not on file  Social Connections: Not on file    No Known Allergies  Outpatient Medications Prior to Visit  Medication Sig Dispense Refill   ipratropium (ATROVENT) 0.06 % nasal spray Place 2 sprays into both nostrils 4 (four) times daily. As needed for nasal congestion, runny nose (Patient not taking: Reported on 04/27/2022) 15 mL 0   sertraline (ZOLOFT) 100 MG tablet Take one pill once per day (Patient not taking: Reported on 05/24/2022) 90 tablet 1   No facility-administered medications prior to visit.     ROS Review of Systems  Constitutional:  Negative for activity change and appetite change.  HENT:  Positive for congestion and postnasal drip. Negative for sinus pressure and sore throat.   Respiratory:  Positive for cough. Negative for  chest tightness, shortness of breath and wheezing.   Cardiovascular:  Negative for chest pain and palpitations.  Gastrointestinal:  Negative for abdominal distention, abdominal pain and constipation.  Genitourinary: Negative.   Musculoskeletal: Negative.   Neurological:  Positive for headaches.  Psychiatric/Behavioral:  Negative for behavioral problems and dysphoric mood.     Objective:  BP 128/88   Pulse 93   Temp 98.4 F (36.9 C) (Oral)   Ht _0  (1.626 m)   Wt 281 lb 6.4 oz (127.6 kg)   LMP  (LMP Unknown)   SpO2 99%   BMI 48.30 kg/m      05/24/2022   10:02 AM 05/19/2022   10:24 AM 04/27/2022   10:25 PM  BP/Weight  Systolic BP 638 177 99  Diastolic BP 88 85 64  Wt. (Lbs) 281.4    BMI 48.3 kg/m2        Physical  Exam Constitutional:      Appearance: She is well-developed.  HENT:     Right Ear: Tympanic membrane normal.     Left Ear: Tympanic membrane normal.     Nose:     Comments: Nasal speech    Mouth/Throat:     Mouth: Mucous membranes are moist.  Cardiovascular:     Rate and Rhythm: Normal rate.     Heart sounds: Normal heart sounds. No murmur heard. Pulmonary:     Effort: Pulmonary effort is normal.     Breath sounds: Normal breath sounds. No wheezing or rales.  Chest:     Chest wall: No tenderness.  Abdominal:     General: Bowel sounds are normal. There is no distension.     Palpations: Abdomen is soft. There is no mass.     Tenderness: There is no abdominal tenderness.  Musculoskeletal:        General: Normal range of motion.     Right lower leg: No edema.     Left lower leg: No edema.  Lymphadenopathy:     Cervical: No cervical adenopathy.  Neurological:     Mental Status: She is alert and oriented to person, place, and time.  Psychiatric:        Mood and Affect: Mood normal.        Latest Ref Rng & Units 04/27/2022    2:15 PM 04/06/2021   12:09 PM 01/26/2019   11:58 AM  CMP  Glucose 70 - 99 mg/dL 87  91  95   BUN 6 - 20 mg/dL _1 Creatinine 0.44 - 1.00 mg/dL 0.87  0.74  0.70   Sodium 135 - 145 mmol/L 139  138  137   Potassium 3.5 - 5.1 mmol/L 5.2  4.5  4.0   Chloride 98 - 111 mmol/L 104  105  108   CO2 22 - 32 mmol/L _2 Calcium 8.9 - 10.3 mg/dL 9.5  9.4  8.9   Total Protein 6.0 - 8.5 g/dL  6.8    Total Bilirubin 0.0 - 1.2 mg/dL  0.3    Alkaline Phos 44 - 121 IU/L  86    AST 0 - 40 IU/L  19    ALT 0 - 32 IU/L  10      Lipid Panel     Component Value Date/Time   CHOL 199 04/06/2021 1209   TRIG 104 04/06/2021 1209   HDL 40 04/06/2021 1209   CHOLHDL 5.0 (H) 04/06/2021 1209   LDLCALC 140 (H)  04/06/2021 1209    CBC    Component Value Date/Time   WBC 7.9 04/27/2022 1235   RBC 4.37 04/27/2022 1235   HGB 12.3 04/27/2022 1235   HGB 11.9  04/06/2021 1209   HCT 41.3 04/27/2022 1235   HCT 38.0 04/06/2021 1209   PLT 112 (L) 04/27/2022 1235   PLT 367 04/06/2021 1209   MCV 94.5 04/27/2022 1235   MCV 89 04/06/2021 1209   MCH 28.1 04/27/2022 1235   MCHC 29.8 (L) 04/27/2022 1235   RDW 12.6 04/27/2022 1235   RDW 11.9 04/06/2021 1209   LYMPHSABS 2.6 04/06/2021 1209   MONOABS 0.4 01/11/2014 1026   EOSABS 0.1 04/06/2021 1209   BASOSABS 0.0 04/06/2021 1209    Lab Results  Component Value Date   HGBA1C 5.2 05/01/2021    Assessment & Plan:  1. GAD (generalized anxiety disorder) Due to challenges with adherence with Zoloft I will place her on hydroxyzine to use as needed.  Advised of sedating adverse effects of this medication. - hydrOXYzine (ATARAX) 25 MG tablet; Take 1 tablet (25 mg total) by mouth 3 (three) times daily as needed.  Dispense: 60 tablet; Refill: 1  2. Acute non-recurrent frontal sinusitis Prescribed Flonase Advised to perform nasal irrigation and I have provided her with a kit for this. Continue with Mucinex Antibiotic indicated at this time.    Meds ordered this encounter  Medications   DISCONTD: fluticasone (FLONASE) 50 MCG/ACT nasal spray    Sig: Place 2 sprays into both nostrils daily.    Dispense:  16 g    Refill:  1   hydrOXYzine (ATARAX) 25 MG tablet    Sig: Take 1 tablet (25 mg total) by mouth 3 (three) times daily as needed.    Dispense:  60 tablet    Refill:  1    Follow-up: Return in about 1 month (around 06/23/2022) for Steele, MD, FAAFP. [facility] and [place], [place]   05/24/2022, 1:19 PM

## 2022-05-27 ENCOUNTER — Other Ambulatory Visit: Payer: Self-pay | Admitting: Family Medicine

## 2022-05-27 DIAGNOSIS — F411 Generalized anxiety disorder: Secondary | ICD-10-CM

## 2022-05-28 NOTE — Telephone Encounter (Signed)
Requested medications are due for refill today.  unsure  Requested medications are on the active medications list.  yes  Last refill. 05/01/2021 #90 1 rf  Future visit scheduled.   yes  Notes to clinic.  Per med list pt no longer taking on 05/24/2022    Requested Prescriptions  Pending Prescriptions Disp Refills   sertraline (ZOLOFT) 100 MG tablet [Pharmacy Med Name: SERTRALINE '100MG'$  TABLETS] 90 tablet 1    Sig: TAKE 1 TABLET BY MOUTH EVERY DAY     Psychiatry:  Antidepressants - SSRI - sertraline Failed - 05/27/2022 10:53 AM      Failed - AST in normal range and within 360 days    AST  Date Value Ref Range Status  04/06/2021 19 0 - 40 IU/L Final         Failed - ALT in normal range and within 360 days    ALT  Date Value Ref Range Status  04/06/2021 10 0 - 32 IU/L Final         Passed - Completed PHQ-2 or PHQ-9 in the last 360 days      Passed - Valid encounter within last 6 months    Recent Outpatient Visits           4 days ago GAD (generalized anxiety disorder)   [place], Charlane Ferretti, MD   1 month ago Urinary frequency   [place] Kerin Perna, NP   1 year ago Annual physical exam   [place], Charlane Ferretti, MD   1 year ago COVID-19 virus infection   [place], Charlane Ferretti, MD   1 year ago Screening for metabolic disorder   [place], MD       Future Appointments             In 1 month Charlott Rakes, MD [place]

## 2022-06-05 ENCOUNTER — Ambulatory Visit: Payer: Medicaid Other | Attending: Family Medicine

## 2022-06-05 DIAGNOSIS — Z111 Encounter for screening for respiratory tuberculosis: Secondary | ICD-10-CM | POA: Diagnosis not present

## 2022-06-08 ENCOUNTER — Ambulatory Visit: Payer: Self-pay | Admitting: *Deleted

## 2022-06-08 NOTE — Telephone Encounter (Signed)
Second attempt to contact patient- no answer- left VM to contact office

## 2022-06-08 NOTE — Telephone Encounter (Signed)
3rd attempt to contact patient in regards to TB skin test being read. No answer [id number] [phone].  Per CDC: The skin reaction should be read between 48-72 hours after administration. A patient who does not return within 72 hours will need to be rescheduled for another skin test.

## 2022-06-08 NOTE — Telephone Encounter (Signed)
Pt was informed that she may come in to get her reading today after today she will need to have test re done.

## 2022-06-08 NOTE — Telephone Encounter (Addendum)
Summary: TB Test reading advice   Pt is calling to see if it is too late to get her TB test read. TB test was taken on Tuesday and stated that Friday was the last day for the TB test to be read. Test was taken at 8:40a in the morning. Please advise         Attempted to call patient - no answer- left message to call office.  Per CDC: The skin reaction should be read between 48-72 hours after administration. A patient who does not return within 72 hours will need to be rescheduled for another skin test.

## 2022-06-11 ENCOUNTER — Telehealth: Payer: Self-pay | Admitting: Family Medicine

## 2022-06-11 NOTE — Telephone Encounter (Signed)
Called patient to schedule new gyn annual for January, there was no answer to the phone call so a voicemail was left with the call back number for the office.

## 2022-06-18 ENCOUNTER — Ambulatory Visit
Admission: EM | Admit: 2022-06-18 | Discharge: 2022-06-18 | Disposition: A | Discharge status: Home / Self Care | Payer: Medicaid Other | Attending: Nurse Practitioner | Admitting: Nurse Practitioner

## 2022-06-18 DIAGNOSIS — R42 Dizziness and giddiness: Secondary | ICD-10-CM | POA: Insufficient documentation

## 2022-06-18 DIAGNOSIS — R519 Headache, unspecified: Secondary | ICD-10-CM | POA: Insufficient documentation

## 2022-06-18 DIAGNOSIS — R112 Nausea with vomiting, unspecified: Secondary | ICD-10-CM | POA: Insufficient documentation

## 2022-06-18 DIAGNOSIS — Z1152 Encounter for screening for COVID-19: Secondary | ICD-10-CM | POA: Insufficient documentation

## 2022-06-18 LAB — POCT URINALYSIS DIP (MANUAL ENTRY)
Bilirubin, UA: NEGATIVE
Blood, UA: NEGATIVE
Glucose, UA: NEGATIVE mg/dL
Ketones, POC UA: NEGATIVE mg/dL
Leukocytes, UA: NEGATIVE
Nitrite, UA: NEGATIVE
Protein Ur, POC: NEGATIVE mg/dL
Spec Grav, UA: 1.02 (ref 1.010–1.025)
Urobilinogen, UA: 1 E.U./dL
pH, UA: 7 (ref 5.0–8.0)

## 2022-06-18 LAB — RESP PANEL BY RT-PCR (FLU A&B, COVID) ARPGX2
Influenza A by PCR: NEGATIVE
Influenza B by PCR: NEGATIVE
SARS Coronavirus 2 by RT PCR: NEGATIVE

## 2022-06-18 LAB — POCT URINE PREGNANCY: Preg Test, Ur: NEGATIVE

## 2022-06-18 MED ORDER — ONDANSETRON HCL 4 MG PO TABS: 4.0000 mg | ORAL_TABLET | Freq: Four times a day (QID) | ORAL | 0 refills | Status: DC

## 2022-06-18 NOTE — Discharge Instructions (Addendum)
Rest and fluids Zofran ans needed for nausea/vomiting Stay hydrated with water, Gatorade, Powerade, Pedialyte Follow-up with your PCP 2 to 3 days for recheck Please go to the emergency room for any worsening symptoms I hope you feel better soon!

## 2022-06-18 NOTE — ED Provider Notes (Signed)
[facility]    CSN: [id number] Arrival date & time: 06/18/22  0930      History   Chief Complaint Chief Complaint  Patient presents with   Dizziness   Emesis    HPI Breanna Thomas is a 41 y.o. female presents for evaluation of nausea/vomiting and dizziness.  Patient reports yesterday she developed intermittent dizziness.  She states she did have a headache with this as well but this is since resolved.  She has a history of vertigo and states her dizziness is different from her typical vertigo symptoms, however, her sx do worsen with position change.  Denies syncope or near syncope.  No CP or SOB. This morning she awoke and had 1 episode of vomiting.  No additional episodes today but does endorse intermittent nausea.  No fevers, URI symptoms, abdominal pain, dysuria.  Denies headache as the worst headache of her life.  States she does not stay hydrated and drinks maybe 1 glass of water a day with minimal other fluids. No recent travel. No ingestion of contaminated or undercooked foods.  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 She denies any other concerns at this time.    Dizziness Associated symptoms: headaches, nausea and vomiting   Emesis Associated symptoms: headaches     Past Medical History:  Diagnosis Date   Anemia    Anxiety    Anxiety    Panic attacks    Vertigo     Patient Active Problem List   Diagnosis Date Noted   Major depressive disorder, recurrent episode, moderate with anxious distress ([place]) 03/24/2020   Nonspecific abnormal electrocardiogram (ECG) (EKG) 02/26/2019   Morbid obesity ([place]) 02/26/2019   Chest discomfort 02/26/2019   Erroneous encounter - disregard 01/25/2019   GERD (gastroesophageal reflux disease) 04/20/2016   Cough 04/20/2016   IFG (impaired fasting glucose) 12/30/2013   Unspecified vitamin D deficiency 12/30/2013   Back muscle spasm 12/15/2013   Anxiety  04/10/2013    Past Surgical History:  Procedure Laterality Date   NO PAST SURGERIES      OB History     Gravida  3   Para  3   Term      Preterm      AB      Living         SAB      IAB      Ectopic      Multiple      Live Births               Home Medications    Prior to Admission medications   Medication Sig Start Date End Date Taking? Authorizing Provider  hydrOXYzine (ATARAX) 25 MG tablet Take 1 tablet (25 mg total) by mouth 3 (three) times daily as needed. 05/24/22  Yes [place], Charlane Ferretti, MD  ondansetron (ZOFRAN) 4 MG tablet Take 1 tablet (4 mg total) by mouth every 6 (six) hours. 06/18/22  Yes Efrain Sella R, NP  fluticasone (FLONASE) 50 MCG/ACT nasal spray SHAKE LIQUID AND USE 2 SPRAYS IN EACH NOSTRIL DAILY 05/24/22   Charlott Rakes, MD  ipratropium (ATROVENT) 0.06 % nasal spray Place 2 sprays into both nostrils 4 (four) times daily. As needed for nasal congestion, runny nose Patient not taking: Reported on 04/27/2022 07/03/21   Lynden Oxford Scales, PA-C  sertraline (ZOLOFT) 100 MG tablet TAKE 1 TABLET BY MOUTH EVERY DAY 05/29/22  Charlott Rakes, MD    Family History Family History  Adopted: Yes  Problem Relation Age of Onset   Diabetes Mother    Hypertension Mother    Heart disease Father    Seizures Father    Seizures Other    Heart failure Other    Diabetes Sister     Social History Social History   Tobacco Use   Smoking status: Never   Smokeless tobacco: Never  Vaping Use   Vaping Use: Never used  Substance Use Topics   Alcohol use: Not Currently    Alcohol/week: 1.0 standard drink of alcohol    Types: 1 Glasses of wine per week    Comment: social   Drug use: No     Allergies   Patient has no known allergies.   Review of Systems Review of Systems  Gastrointestinal:  Positive for nausea and vomiting.  Neurological:  Positive for dizziness and headaches.     Physical Exam Triage Vital Signs ED Triage Vitals  Enc  Vitals Group     BP 06/18/22 0945 120/86     Pulse Rate 06/18/22 0945 89     Resp 06/18/22 0945 16     Temp 06/18/22 0945 98.2 F (36.8 C)     Temp Source 06/18/22 0945 Oral     SpO2 06/18/22 0945 97 %     Weight --      Height --      Head Circumference --      Peak Flow --      Pain Score 06/18/22 0949 0     Pain Loc --      Pain Edu? --      Excl. in [place]? --    No data found.  Updated Vital Signs BP 120/86 (BP Location: Right Arm)   Pulse 89   Temp 98.2 F (36.8 C) (Oral)   Resp 16   LMP 05/23/2022 (Approximate)   SpO2 97%   Visual Acuity Right Eye Distance:   Left Eye Distance:   Bilateral Distance:    Right Eye Near:   Left Eye Near:    Bilateral Near:     Physical Exam Vitals and nursing note reviewed.  Constitutional:      General: She is not in acute distress.    Appearance: Normal appearance. She is not ill-appearing.  HENT:     Head: Normocephalic and atraumatic.     Right Ear: Tympanic membrane and ear canal normal.     Left Ear: Tympanic membrane and ear canal normal.     Nose: Nose normal.  Eyes:     Extraocular Movements: Extraocular movements intact.     Conjunctiva/sclera: Conjunctivae normal.     Pupils: Pupils are equal, round, and reactive to light.  Cardiovascular:     Rate and Rhythm: Normal rate and regular rhythm.     Heart sounds: Normal heart sounds.  Pulmonary:     Effort: Pulmonary effort is normal.     Breath sounds: Normal breath sounds.  Abdominal:     General: Bowel sounds are normal. There is no distension.     Palpations: Abdomen is soft.     Tenderness: There is no abdominal tenderness. There is no right CVA tenderness or left CVA tenderness.  Skin:    General: Skin is warm and dry.  Neurological:     General: No focal deficit present.     Mental Status: She is alert and oriented to person, place, and time.  GCS: GCS eye subscore is 4. GCS verbal subscore is 5. GCS motor subscore is 6.     Cranial Nerves: No facial  asymmetry.     Motor: No weakness.  Psychiatric:        Mood and Affect: Mood normal.        Behavior: Behavior normal.      UC Treatments / Results  Labs (all labs ordered are listed, but only abnormal results are displayed) Labs Reviewed  RESP PANEL BY RT-PCR (FLU A&B, COVID) ARPGX2  POCT URINALYSIS DIP (MANUAL ENTRY)  POCT URINE PREGNANCY    EKG   Radiology No results found.  Procedures Procedures (including critical care time)  Medications Ordered in UC Medications - No data to display  Initial Impression / Assessment and Plan / UC Course  I have reviewed the triage vital signs and the nursing notes.  Pertinent labs & imaging results that were available during my care of the patient were reviewed by me and considered in my medical decision making (see chart for details).     Negative UA and HCG PCR COVID and Flu Patient denies dizziness is worsening Discussed likely viral illness as cause of symptoms Encouraged fluids Zofran as needed Follow-up with PCP 2 to 3 days for recheck ER for any worsening symptoms Final Clinical Impressions(s) / UC Diagnoses   Final diagnoses:  Nausea and vomiting, unspecified vomiting type  Dizziness     Discharge Instructions      Rest and fluids Zofran ans needed for nausea/vomiting Stay hydrated with water, Gatorade, Powerade, Pedialyte Follow-up with your PCP 2 to 3 days for recheck Please go to the emergency room for any worsening symptoms I hope you feel better soon!     ED Prescriptions     Medication Sig Dispense Auth. Provider   ondansetron (ZOFRAN) 4 MG tablet Take 1 tablet (4 mg total) by mouth every 6 (six) hours. 12 tablet Ardelle Balls, NP      PDMP not reviewed this encounter.   Ardelle Balls, NP 06/18/22 1059

## 2022-06-18 NOTE — ED Triage Notes (Signed)
Pt presents with dizziness x 1 day and vomiting this morning.  States her dizziness is not room spinning or off balance but her head feels "woozy". Denies CP, SOB, cough, sinus pressure, fever, etc.  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

## 2022-06-27 ENCOUNTER — Ambulatory Visit: Payer: Medicaid Other | Admitting: Family Medicine

## 2022-07-12 ENCOUNTER — Encounter: Payer: Self-pay | Admitting: Emergency Medicine

## 2022-07-12 ENCOUNTER — Ambulatory Visit
Admission: EM | Admit: 2022-07-12 | Discharge: 2022-07-12 | Disposition: A | Discharge status: Home / Self Care | Payer: Medicaid Other | Attending: Family Medicine | Admitting: Family Medicine

## 2022-07-12 DIAGNOSIS — Z1152 Encounter for screening for COVID-19: Secondary | ICD-10-CM | POA: Diagnosis not present

## 2022-07-12 DIAGNOSIS — J069 Acute upper respiratory infection, unspecified: Secondary | ICD-10-CM | POA: Insufficient documentation

## 2022-07-12 DIAGNOSIS — R0981 Nasal congestion: Secondary | ICD-10-CM | POA: Diagnosis present

## 2022-07-12 DIAGNOSIS — R059 Cough, unspecified: Secondary | ICD-10-CM | POA: Diagnosis present

## 2022-07-12 DIAGNOSIS — R42 Dizziness and giddiness: Secondary | ICD-10-CM | POA: Diagnosis present

## 2022-07-12 LAB — POCT INFLUENZA A/B
Influenza A, POC: NEGATIVE
Influenza B, POC: NEGATIVE

## 2022-07-12 MED ORDER — PROMETHAZINE-DM 6.25-15 MG/5ML PO SYRP: 5.0000 mL | ORAL_SOLUTION | Freq: Four times a day (QID) | ORAL | 0 refills | Status: DC | PRN

## 2022-07-12 NOTE — Discharge Instructions (Signed)
You have been tested for COVID-19 today. °If your test returns positive, you will receive a phone call from [facility] regarding your results. °Negative test results are not called. °Both positive and negative results area always visible on MyChart. °If you do not have a MyChart account, sign up instructions are provided in your discharge papers. °Please do not hesitate to contact us should you have questions or concerns. ° °

## 2022-07-12 NOTE — ED Provider Notes (Signed)
[place]   [id number] 07/12/22 Arrival Time: 0034  ASSESSMENT & PLAN:  1. Viral URI with cough    Discussed typical duration of likely viral illness. Results for orders placed or performed during the hospital encounter of 07/12/22  POCT Influenza A/B  Result Value Ref Range   Influenza A, POC Negative Negative   Influenza B, POC Negative Negative  Covid testing pending.  OTC symptom care as needed.  New Prescriptions   PROMETHAZINE-DEXTROMETHORPHAN (PROMETHAZINE-DM) 6.25-15 MG/5ML SYRUP    Take 5 mLs by mouth 4 (four) times daily as needed for cough.     Follow-up Information     Charlott Rakes, MD.   Specialty: Family Medicine Why: As needed. Contact information: [address] [place] [address] [phone]                 Reviewed expectations re: course of current medical issues. Questions answered. Outlined signs and symptoms indicating need for more acute intervention. Understanding verbalized. After Visit Summary given.   SUBJECTIVE: History from: Patient. Breanna Thomas is a 41 y.o. female. Reports: cough, congestion, dizziness, loss of taste, and back pain.  Abrupt onset; x 2 days. Fatigued. Onset~x2 days  Denies: fever and difficulty breathing. Normal PO intake without n/v/d.  OBJECTIVE:  Vitals:   07/12/22 1633  BP: (!) 138/90  Pulse: 85  Resp: 18  Temp: 98.1 F (36.7 C)  SpO2: 98%    General appearance: alert; no distress Eyes: PERRLA; EOMI; conjunctiva normal HENT: Many; AT; with nasal congestion Neck: supple  Lungs: speaks full sentences without difficulty; unlabored; dry cough; CTAB Extremities: no edema Skin: warm and dry Neurologic: normal gait Psychological: alert and cooperative; normal mood and affect  Labs: Results for orders placed or performed during the hospital encounter of 07/12/22  POCT Influenza A/B  Result Value Ref Range   Influenza A, POC Negative Negative   Influenza  B, POC Negative Negative   Labs Reviewed  SARS CORONAVIRUS 2 (TAT 6-24 HRS)  POCT INFLUENZA A/B    Imaging: No results found.  No Known Allergies  Past Medical History:  Diagnosis Date   Anemia    Anxiety    Anxiety    Panic attacks    Vertigo    Social History   Socioeconomic History   Marital status: Divorced    Spouse name: Not on file   Number of children: 3   Years of education: College   Highest education level: Not on file  Occupational History    Employer: [employer]    Comment: [employer]  Tobacco Use   Smoking status: Never   Smokeless tobacco: Never  Vaping Use   Vaping Use: Never used  Substance and Sexual Activity   Alcohol use: Not Currently    Alcohol/week: 1.0 standard drink of alcohol    Types: 1 Glasses of wine per week    Comment: social   Drug use: No   Sexual activity: Not Currently    Birth control/protection: None  Other Topics Concern   Not on file  Social History Narrative   Patient lives at home alone.   Caffeine Use: none in the past 2 weeks, soda occasionally   Social Determinants of Health   Financial Resource Strain: Not on file  Food Insecurity: Not on file  Transportation Needs: Not on file  Physical Activity: Not on file  Stress: Not on file  Social Connections: Not on file  Intimate Partner Violence: Not on  file   Family History  Adopted: Yes  Problem Relation Age of Onset   Diabetes Mother    Hypertension Mother    Heart disease Father    Seizures Father    Seizures Other    Heart failure Other    Diabetes Sister    Past Surgical History:  Procedure Laterality Date   NO PAST SURGERIES       Vanessa Kick, MD 07/12/22 [phone]

## 2022-07-12 NOTE — ED Triage Notes (Signed)
Pt is present today with c/o cough, congestion, dizziness, loss of taste, and back pain.   Onset~x2 days

## 2022-07-13 LAB — SARS CORONAVIRUS 2 (TAT 6-24 HRS): SARS Coronavirus 2: NEGATIVE

## 2022-07-16 ENCOUNTER — Emergency Department (HOSPITAL_BASED_OUTPATIENT_CLINIC_OR_DEPARTMENT_OTHER): Payer: Medicaid Other

## 2022-07-16 ENCOUNTER — Emergency Department (HOSPITAL_BASED_OUTPATIENT_CLINIC_OR_DEPARTMENT_OTHER)
Admission: EM | Admit: 2022-07-16 | Discharge: 2022-07-16 | Disposition: A | Discharge status: Home / Self Care | Payer: Medicaid Other | Attending: Emergency Medicine | Admitting: Emergency Medicine

## 2022-07-16 ENCOUNTER — Other Ambulatory Visit: Payer: Self-pay

## 2022-07-16 ENCOUNTER — Encounter (HOSPITAL_BASED_OUTPATIENT_CLINIC_OR_DEPARTMENT_OTHER): Payer: Self-pay | Admitting: Emergency Medicine

## 2022-07-16 DIAGNOSIS — J069 Acute upper respiratory infection, unspecified: Secondary | ICD-10-CM | POA: Insufficient documentation

## 2022-07-16 DIAGNOSIS — R059 Cough, unspecified: Secondary | ICD-10-CM | POA: Diagnosis not present

## 2022-07-16 DIAGNOSIS — R079 Chest pain, unspecified: Secondary | ICD-10-CM | POA: Diagnosis not present

## 2022-07-16 DIAGNOSIS — R000 Tachycardia, unspecified: Secondary | ICD-10-CM | POA: Insufficient documentation

## 2022-07-16 DIAGNOSIS — R0789 Other chest pain: Secondary | ICD-10-CM | POA: Diagnosis not present

## 2022-07-16 DIAGNOSIS — Z1152 Encounter for screening for COVID-19: Secondary | ICD-10-CM | POA: Insufficient documentation

## 2022-07-16 DIAGNOSIS — B9789 Other viral agents as the cause of diseases classified elsewhere: Secondary | ICD-10-CM | POA: Diagnosis not present

## 2022-07-16 LAB — RESP PANEL BY RT-PCR (RSV, FLU A&B, COVID)  RVPGX2
Influenza A by PCR: NEGATIVE
Influenza B by PCR: NEGATIVE
Resp Syncytial Virus by PCR: NEGATIVE
SARS Coronavirus 2 by RT PCR: NEGATIVE

## 2022-07-16 MED ORDER — AMOXICILLIN-POT CLAVULANATE 875-125 MG PO TABS: 1.0000 | ORAL_TABLET | Freq: Two times a day (BID) | ORAL | 0 refills | Status: AC

## 2022-07-16 MED ORDER — BENZONATATE 100 MG PO CAPS: 100.0000 mg | ORAL_CAPSULE | Freq: Three times a day (TID) | ORAL | 0 refills | Status: DC

## 2022-07-16 NOTE — ED Triage Notes (Signed)
Pt arrives pov, steady gait, c/o cough, sore throat and Chest pain when coughing x 1 week

## 2022-07-16 NOTE — Discharge Instructions (Signed)
You came to the emergency department today with flulike symptoms.  Your flu, COVID and RSV are all negative.  Your chest x-ray is also normal.  Your EKG is no different than previous.  I sent 2 medications to your pharmacy.  The first is benzonatate.  This is for cough.  Delsym and Robitussin are also good options for her cough.  Flulike symptoms in general can be treated with DayQuil and NyQuil.  Congestion can be treated with Mucinex.  These medications are safe to take together.  Please follow-up with your PCP in a week if nothing is improving.  Return to the emergency department with any worsening symptoms.  It was a pleasure to meet you and we hope you feel better.  Merry Christmas!

## 2022-07-16 NOTE — ED Notes (Signed)
Pt discharged to home. Discharge instructions have been discussed with patient and/or family members. Pt verbally acknowledges understanding d/c instructions, and endorses comprehension to checkout at registration before leaving.  °

## 2022-07-16 NOTE — ED Provider Notes (Signed)
[facility] EMERGENCY DEPARTMENT Provider Note   CSN: [id number] Arrival date & time: 07/16/22  1147     History  Chief Complaint  Patient presents with   Cough    Breanna Thomas is a 41 y.o. female presenting with a week worth of URI.  Was seen by urgent care but was not given any medication.  Endorsing a cough occasionally productive of yellow/green sputum.  Denies any fevers but says that she has been congested as well.  No known sick contacts.   Cough      Home Medications Prior to Admission medications   Medication Sig Start Date End Date Taking? Authorizing Provider  fluticasone (FLONASE) 50 MCG/ACT nasal spray SHAKE LIQUID AND USE 2 SPRAYS IN EACH NOSTRIL DAILY 05/24/22   Charlott Rakes, MD  hydrOXYzine (ATARAX) 25 MG tablet Take 1 tablet (25 mg total) by mouth 3 (three) times daily as needed. 05/24/22   Charlott Rakes, MD  ipratropium (ATROVENT) 0.06 % nasal spray Place 2 sprays into both nostrils 4 (four) times daily. As needed for nasal congestion, runny nose Patient not taking: Reported on 04/27/2022 07/03/21   Lynden Oxford Scales, PA-C  ondansetron (ZOFRAN) 4 MG tablet Take 1 tablet (4 mg total) by mouth every 6 (six) hours. 06/18/22   Melynda Ripple, NP  promethazine-dextromethorphan (PROMETHAZINE-DM) 6.25-15 MG/5ML syrup Take 5 mLs by mouth 4 (four) times daily as needed for cough. 07/12/22   Vanessa Kick, MD  sertraline (ZOLOFT) 100 MG tablet TAKE 1 TABLET BY MOUTH EVERY DAY 05/29/22   Charlott Rakes, MD      Allergies    Patient has no known allergies.    Review of Systems   Review of Systems  Respiratory:  Positive for cough.     Physical Exam Updated Vital Signs BP (!) 143/107 (BP Location: Right Arm)   Pulse (!) 108   Temp 98.4 F (36.9 C) (Oral)   Resp 18   Wt 127 kg   LMP 07/02/2022 (Approximate)   SpO2 100%   BMI 48.06 kg/m  Physical Exam Vitals and nursing note reviewed.  Constitutional:      General: She is not in  acute distress.    Appearance: Normal appearance. She is not ill-appearing.  HENT:     Head: Normocephalic and atraumatic.     Mouth/Throat:     Mouth: Mucous membranes are moist.     Pharynx: Oropharynx is clear. No oropharyngeal exudate or posterior oropharyngeal erythema.  Eyes:     General: No scleral icterus.    Conjunctiva/sclera: Conjunctivae normal.  Cardiovascular:     Rate and Rhythm: Regular rhythm. Tachycardia present.  Pulmonary:     Effort: Pulmonary effort is normal. No respiratory distress.     Breath sounds: No wheezing or rales.  Skin:    General: Skin is warm and dry.     Findings: No rash.  Neurological:     Mental Status: She is alert.  Psychiatric:        Mood and Affect: Mood normal.     ED Results / Procedures / Treatments   Labs (all labs ordered are listed, but only abnormal results are displayed) Labs Reviewed  RESP PANEL BY RT-PCR (RSV, FLU A&B, COVID)  RVPGX2    EKG EKG Interpretation  Date/Time:  Monday July 16 2022 11:59:32 EST Ventricular Rate:  100 PR Interval:  124 QRS Duration: 76 QT Interval:  358 QTC Calculation: 461 R Axis:   33 Text Interpretation: Normal sinus  rhythm T wave abnormality, consider inferior ischemia T wave abnormality, consider anterior ischemia Prolonged QT Abnormal ECG When compared with ECG of 10-Feb-2019 11:20, No significant change since last tracing Confirmed by Aletta Edouard [phone]) on 07/16/2022 12:02:13 PM  Radiology DG Chest 2 View  Result Date: 07/16/2022 CLINICAL DATA:  cough, chest pain EXAM: CHEST - 2 VIEW COMPARISON:  January 23, 2019 FINDINGS: The cardiomediastinal silhouette is normal in contour. No pleural effusion. No pneumothorax. No acute pleuroparenchymal abnormality. Visualized abdomen is unremarkable. No acute osseous abnormality noted. IMPRESSION: No acute cardiopulmonary abnormality. Electronically Signed   By: Valentino Saxon M.D.   On: 07/16/2022 12:18    Procedures Procedures    Medications Ordered in ED Medications - No data to display  ED Course/ Medical Decision Making/ A&P                           Medical Decision Making Amount and/or Complexity of Data Reviewed Radiology: ordered.  Risk Prescription drug management.   41 year old female presenting today with cough, some chest tightness and chest and nasal congestion.  Has been going on for nearly a week.  Differential includes but is not limited to viral illness versus ACS versus PE.  Not exhaustive.  Physical exam: Very minor tachycardia to around 101  Imaging: Chest x-ray ordered in triage, viewed and interpreted by me.  No abnormalities.  EKG was performed in triage, there is no acute finding, patient has a history of some nonspecific abnormalities which are not changed today  MDM/disposition: 41 year old female presenting today with URI symptoms.  Did consider PE, unable to apply PERC criteria as patient is having heart rate around 100-1 01 on my auscultation.  She has no risk factors for PE and is low likelihood Wells.  I suspect her tachycardia to be somewhat in the setting of viral illness.  She repeatedly requests antibiotics because she feels it is bacterial.  We discussed that antibiotics do not treat viruses and at this time she likely is suffering from a virus.  I am willing to give her a written prescription for Augmentin for her to fill if she is still feeling poorly in 1 week  Final Clinical Impression(s) / ED Diagnoses Final diagnoses:  Viral URI with cough    Rx / DC Orders ED Discharge Orders          Ordered    amoxicillin-clavulanate (AUGMENTIN) 875-125 MG tablet  Every 12 hours        07/16/22 1423    benzonatate (TESSALON) 100 MG capsule  Every 8 hours        07/16/22 1424           Results and diagnoses were explained to the patient. Return precautions discussed in full. Patient had no additional questions and expressed complete understanding.   This chart was  dictated using voice recognition software.  Despite best efforts to proofread,  errors can occur which can change the documentation meaning.     Darliss Ridgel 07/16/22 1436    Hayden Rasmussen, MD 07/17/22 [phone]

## 2022-07-21 ENCOUNTER — Emergency Department (HOSPITAL_BASED_OUTPATIENT_CLINIC_OR_DEPARTMENT_OTHER)
Admission: EM | Admit: 2022-07-21 | Discharge: 2022-07-21 | Disposition: A | Discharge status: Home / Self Care | Payer: Medicaid Other | Attending: Emergency Medicine | Admitting: Emergency Medicine

## 2022-07-21 ENCOUNTER — Other Ambulatory Visit: Payer: Self-pay

## 2022-07-21 DIAGNOSIS — Z1152 Encounter for screening for COVID-19: Secondary | ICD-10-CM | POA: Insufficient documentation

## 2022-07-21 DIAGNOSIS — J111 Influenza due to unidentified influenza virus with other respiratory manifestations: Secondary | ICD-10-CM

## 2022-07-21 DIAGNOSIS — R059 Cough, unspecified: Secondary | ICD-10-CM | POA: Diagnosis present

## 2022-07-21 DIAGNOSIS — J101 Influenza due to other identified influenza virus with other respiratory manifestations: Secondary | ICD-10-CM | POA: Insufficient documentation

## 2022-07-21 LAB — RESP PANEL BY RT-PCR (RSV, FLU A&B, COVID)  RVPGX2
Influenza A by PCR: POSITIVE — AB
Influenza B by PCR: NEGATIVE
Resp Syncytial Virus by PCR: NEGATIVE
SARS Coronavirus 2 by RT PCR: NEGATIVE

## 2022-07-21 MED ORDER — IBUPROFEN 800 MG PO TABS: 800.0000 mg | ORAL_TABLET | Freq: Three times a day (TID) | ORAL | 0 refills | Status: DC

## 2022-07-21 MED ORDER — IBUPROFEN 400 MG PO TABS
600.0000 mg | ORAL_TABLET | Freq: Once | ORAL | Status: AC
  Administered 2022-07-21: 600 mg via ORAL
  Filled 2022-07-21: qty 1

## 2022-07-21 MED ORDER — IBUPROFEN 800 MG PO TABS
800.0000 mg | ORAL_TABLET | Freq: Once | ORAL | Status: AC
  Administered 2022-07-21: 800 mg via ORAL
  Filled 2022-07-21: qty 1

## 2022-07-21 NOTE — ED Provider Notes (Signed)
[facility] EMERGENCY DEPARTMENT Provider Note   CSN: [id number] Arrival date & time: 07/21/22  1753     History  Chief Complaint  Patient presents with   Cough    Breanna Thomas is a 41 y.o. female.  With past medical history of GERD, obesity who presents to the emergency department with cough.  Patient states that she originally had symptoms around Christmas time of cough, sore throat.  She states she was evaluated at that time and prescribed Augmentin as well as Tessalon.  She states that her symptoms improved and then returned again yesterday evening with cough, headache, chills and congestion.  She denies having sore throat, shortness of breath or chest pain.  Denies dyspnea on exertion.  She has almost completed her Augmentin course with 1 day left.  She has been using Tessalon without much improvement in her cough.  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  Cough Associated symptoms: fever   Associated symptoms: no chest pain and no shortness of breath        Home Medications Prior to Admission medications   Medication Sig Start Date End Date Taking? Authorizing Provider  ibuprofen (ADVIL) 800 MG tablet Take 1 tablet (800 mg total) by mouth 3 (three) times daily. 07/21/22  Yes Cristopher Peru, PA-C  benzonatate (TESSALON) 100 MG capsule Take 1 capsule (100 mg total) by mouth every 8 (eight) hours. 07/16/22   Redwine, Madison A, PA-C  fluticasone (FLONASE) 50 MCG/ACT nasal spray SHAKE LIQUID AND USE 2 SPRAYS IN EACH NOSTRIL DAILY 05/24/22   Hoy Register, MD  hydrOXYzine (ATARAX) 25 MG tablet Take 1 tablet (25 mg total) by mouth 3 (three) times daily as needed. 05/24/22   Hoy Register, MD  ipratropium (ATROVENT) 0.06 % nasal spray Place 2 sprays into both nostrils 4 (four) times daily. As needed for nasal congestion, runny nose Patient not taking: Reported on 04/27/2022 07/03/21   Theadora Rama Scales, PA-C  ondansetron (ZOFRAN) 4 MG  tablet Take 1 tablet (4 mg total) by mouth every 6 (six) hours. 06/18/22   Radford Pax, NP  promethazine-dextromethorphan (PROMETHAZINE-DM) 6.25-15 MG/5ML syrup Take 5 mLs by mouth 4 (four) times daily as needed for cough. 07/12/22   Mardella Layman, MD  sertraline (ZOLOFT) 100 MG tablet TAKE 1 TABLET BY MOUTH EVERY DAY 05/29/22   Hoy Register, MD      Allergies    Patient has no known allergies.    Review of Systems   Review of Systems  Constitutional:  Positive for fever.  HENT:  Positive for congestion.   Respiratory:  Positive for cough. Negative for shortness of breath.   Cardiovascular:  Negative for chest pain.  All other systems reviewed and are negative.   Physical Exam Updated Vital Signs BP (!) 143/92 (BP Location: Left Wrist)   Pulse (!) 114   Temp 98.3 F (36.8 C)   Resp 16   Ht 5\' 4"  (1.626 m)   Wt 122.5 kg   LMP 07/02/2022 (Approximate)   SpO2 99%   BMI 46.35 kg/m  Physical Exam Vitals and nursing note reviewed.  Constitutional:      General: She is not in acute distress.    Appearance: She is obese. She is ill-appearing. She is not toxic-appearing.  HENT:     Head: Normocephalic and atraumatic.     Mouth/Throat:     Mouth: Mucous membranes are moist.     Pharynx: Oropharynx is clear. Uvula  midline. Posterior oropharyngeal erythema present.     Tonsils: No tonsillar exudate or tonsillar abscesses. 1+ on the right. 1+ on the left.  Eyes:     General: No scleral icterus.    Extraocular Movements: Extraocular movements intact.  Cardiovascular:     Rate and Rhythm: Regular rhythm. Tachycardia present.     Pulses: Normal pulses.     Heart sounds: No murmur heard. Pulmonary:     Effort: Pulmonary effort is normal. No respiratory distress.     Breath sounds: Normal breath sounds. No stridor. No wheezing, rhonchi or rales.  Musculoskeletal:     Cervical back: Neck supple.  Skin:    General: Skin is warm and dry.     Capillary Refill: Capillary refill  takes less than 2 seconds.     Findings: No rash.  Neurological:     General: No focal deficit present.     Mental Status: She is alert and oriented to person, place, and time. Mental status is at baseline.  Psychiatric:        Mood and Affect: Mood normal.        Behavior: Behavior normal.        Thought Content: Thought content normal.        Judgment: Judgment normal.     ED Results / Procedures / Treatments   Labs (all labs ordered are listed, but only abnormal results are displayed) Labs Reviewed  RESP PANEL BY RT-PCR (RSV, FLU A&B, COVID)  RVPGX2 - Abnormal; Notable for the following components:      Result Value   Influenza A by PCR POSITIVE (*)    All other components within normal limits   EKG None  Radiology No results found.  Procedures Procedures   Medications Ordered in ED Medications  ibuprofen (ADVIL) tablet 600 mg (600 mg Oral Given 07/21/22 1815)  ibuprofen (ADVIL) tablet 800 mg (800 mg Oral Given 07/21/22 2003)    ED Course/ Medical Decision Making/ A&P                           Medical Decision Making Risk Prescription drug management.  Initial Impression and Ddx 41 year old female who presents to the emergency department with cough, fever, headache concerning for viral upper respiratory illness.  Flu testing is positive. Patient PMH that increases complexity of ED encounter: Obesity  Interpretation of Diagnostics I independent reviewed and interpreted the labs as followed: Flu positive  - I independently visualized the following imaging with scope of interpretation limited to determining acute life threatening conditions related to emergency care: Not indicated  Patient Reassessment and Ultimate Disposition/Management Patient was provided with ibuprofen on her presentation given that she was febrile and tachycardic.  I had to recheck her vitals and she has defervesced back to 98.  She continues to have a pulse rate of 114.  I think that this  is likely related to her viral illness.  We did discuss at bedside her having further workup with EKG, possible D-dimer, IV fluid challenge.  I do think she is Wells low risk and do not feel that her symptoms are consistent with PE at this time.  We had shared decision making and she prefers to go home with strict return precautions for worsening symptoms.  She is instructed to drink plenty of fluids and is tolerating p.o.  Her symptoms are most consistent with her influenza A illness.  Her lungs are clear bilaterally and she is  oxygenating well on room air.  Do not feel that she needs chest x-ray at this time.  Do not feel that her symptoms are consistent with other etiologies like COPD or asthma, CHF, ACS.  No chest pain or concern for complications like myocarditis or pericarditis.  The patient has been appropriately medically screened and/or stabilized in the ED. I have low suspicion for any other emergent medical condition which would require further screening, evaluation or treatment in the ED or require inpatient management. At time of discharge the patient is hemodynamically stable and in no acute distress. I have discussed work-up results and diagnosis with patient and answered all questions. Patient is agreeable with discharge plan. We discussed strict return precautions for returning to the emergency department and they verbalized understanding.     Patient management required discussion with the following services or consulting groups:  None  Complexity of Problems Addressed Acute uncomplicated illness or injury with no diagnostics  Additional Data Reviewed and Analyzed Further history obtained from: Prior ED visit notes and Care Everywhere  Patient Encounter Risk Assessment Prescriptions  Final Clinical Impression(s) / ED Diagnoses Final diagnoses:  Influenza    Rx / DC Orders ED Discharge Orders          Ordered    ibuprofen (ADVIL) 800 MG tablet  3 times daily         07/21/22 1957              Cristopher Peru, PA-C 07/22/22 1540    Glyn Ade, MD 07/22/22 2205

## 2022-07-21 NOTE — ED Triage Notes (Addendum)
Pt c/o fever, cough, HA since yesterday; had Tylenol at 1600

## 2022-07-21 NOTE — Discharge Instructions (Signed)
You were seen in the emergency department today for cough, headache.  You do have the flu.  I am prescribing you some ibuprofen for you to take at home.  This is used for fever and bodyaches.  You can also continue use over-the-counter cough and cold medication.  Please complete your Augmentin prescription.  You can also continue to use the cough syrup that was prescribed to you on 07/12/2022.  Please return for significantly worsening shortness of breath or difficulty breathing.  Please drink plenty of fluids.

## 2022-07-21 NOTE — ED Notes (Signed)
Seen on Christmas, given antbx and cough medicine. Forgot a few doses of antbx and began to feel ill again.   Headache, cough, runny nose since before christmas. No issues with epistaxis or mucous production like before Christmas. No throat pain. Positive post nasal drip.

## 2022-07-26 ENCOUNTER — Ambulatory Visit: Payer: Medicaid Other | Admitting: Physician Assistant

## 2022-08-03 ENCOUNTER — Other Ambulatory Visit (HOSPITAL_COMMUNITY)
Admission: RE | Admit: 2022-08-03 | Discharge: 2022-08-03 | Disposition: A | Discharge status: Home / Self Care | Payer: Medicaid Other | Source: Ambulatory Visit | Attending: Family Medicine | Admitting: Family Medicine

## 2022-08-03 ENCOUNTER — Other Ambulatory Visit: Payer: Self-pay

## 2022-08-03 DIAGNOSIS — N898 Other specified noninflammatory disorders of vagina: Secondary | ICD-10-CM

## 2022-08-06 LAB — CERVICOVAGINAL ANCILLARY ONLY
Bacterial Vaginitis (gardnerella): NEGATIVE
Candida Glabrata: NEGATIVE
Candida Vaginitis: NEGATIVE
Chlamydia: NEGATIVE
Comment: NEGATIVE
Comment: NEGATIVE
Comment: NEGATIVE
Comment: NEGATIVE
Comment: NEGATIVE
Comment: NORMAL
Neisseria Gonorrhea: NEGATIVE
Trichomonas: NEGATIVE

## 2022-08-07 ENCOUNTER — Ambulatory Visit: Payer: Medicaid Other | Attending: Family Medicine | Admitting: Family Medicine

## 2022-08-07 ENCOUNTER — Encounter: Payer: Self-pay | Admitting: Family Medicine

## 2022-08-07 VITALS — BP 123/87 | HR 100 | Wt 279.8 lb

## 2022-08-07 DIAGNOSIS — N926 Irregular menstruation, unspecified: Secondary | ICD-10-CM

## 2022-08-07 DIAGNOSIS — Z23 Encounter for immunization: Secondary | ICD-10-CM | POA: Diagnosis not present

## 2022-08-07 DIAGNOSIS — B3731 Acute candidiasis of vulva and vagina: Secondary | ICD-10-CM | POA: Diagnosis not present

## 2022-08-07 MED ORDER — CLOTRIMAZOLE 1 % EX CREA: 1.0000 | TOPICAL_CREAM | Freq: Two times a day (BID) | CUTANEOUS | 1 refills | Status: DC

## 2022-08-07 MED ORDER — NYSTATIN 100000 UNIT/GM EX CREA: 1.0000 | TOPICAL_CREAM | Freq: Two times a day (BID) | CUTANEOUS | 1 refills | Status: DC

## 2022-08-07 NOTE — Progress Notes (Signed)
Subjective:  Patient ID: Breanna Thomas, female    DOB: [birth year]  Age: 42 y.o. MRN: [id number]  CC: Vaginal Itching (Patient stated starting to spread to inner thighs)   HPI Breanna Thomas is a 43 y.o. year old female with a history of generalized anxiety disorder, obesity here for an acute visit.    Interval History:  She has had vaginal itching that wakes her up from sleep for the last 6 days. She has no dysuria, no vaginal discharge. She is not sexually active.  Itching has extended from her perineum to her inner thighs. Last month she had her period twice but prior to that has not had this experience although she has noticed her periods have shortened from 5 days now to 3 days. She has not had a period for this month yet.  She is wondering if she might be going through menopause.  Denies presence of recent stressors. Vaginal swab from 4 days ago came back negative.  Past Medical History:  Diagnosis Date   Anemia    Anxiety    Anxiety    Panic attacks    Vertigo     Past Surgical History:  Procedure Laterality Date   NO PAST SURGERIES      Family History  Adopted: Yes  Problem Relation Age of Onset   Diabetes Mother    Hypertension Mother    Heart disease Father    Seizures Father    Seizures Other    Heart failure Other    Diabetes Sister     Social History   Socioeconomic History   Marital status: Divorced    Spouse name: Not on file   Number of children: 3   Years of education: College   Highest education level: Not on file  Occupational History    Employer: [employer]    Comment: [employer]  Tobacco Use   Smoking status: Never   Smokeless tobacco: Never  Vaping Use   Vaping Use: Never used  Substance and Sexual Activity   Alcohol use: Not Currently    Alcohol/week: 1.0 standard drink of alcohol    Types: 1 Glasses of wine per week    Comment: social   Drug use: No   Sexual activity: Not Currently    Birth control/protection:  None  Other Topics Concern   Not on file  Social History Narrative   Patient lives at home alone.   Caffeine Use: none in the past 2 weeks, soda occasionally   Social Determinants of Health   Financial Resource Strain: Not on file  Food Insecurity: Not on file  Transportation Needs: Not on file  Physical Activity: Not on file  Stress: Not on file  Social Connections: Not on file    No Known Allergies  Outpatient Medications Prior to Visit  Medication Sig Dispense Refill   fluticasone (FLONASE) 50 MCG/ACT nasal spray SHAKE LIQUID AND USE 2 SPRAYS IN EACH NOSTRIL DAILY (Patient not taking: Reported on 08/07/2022) 48 g 0   hydrOXYzine (ATARAX) 25 MG tablet Take 1 tablet (25 mg total) by mouth 3 (three) times daily as needed. (Patient not taking: Reported on 08/07/2022) 60 tablet 1   ipratropium (ATROVENT) 0.06 % nasal spray Place 2 sprays into both nostrils 4 (four) times daily. As needed for nasal congestion, runny nose (Patient not taking: Reported on 04/27/2022) 15 mL 0   sertraline (ZOLOFT) 100 MG tablet TAKE 1 TABLET BY MOUTH EVERY DAY (Patient not taking: Reported on  08/07/2022) 90 tablet 1   benzonatate (TESSALON) 100 MG capsule Take 1 capsule (100 mg total) by mouth every 8 (eight) hours. (Patient not taking: Reported on 08/07/2022) 21 capsule 0   ibuprofen (ADVIL) 800 MG tablet Take 1 tablet (800 mg total) by mouth 3 (three) times daily. (Patient not taking: Reported on 08/07/2022) 21 tablet 0   ondansetron (ZOFRAN) 4 MG tablet Take 1 tablet (4 mg total) by mouth every 6 (six) hours. (Patient not taking: Reported on 08/07/2022) 12 tablet 0   promethazine-dextromethorphan (PROMETHAZINE-DM) 6.25-15 MG/5ML syrup Take 5 mLs by mouth 4 (four) times daily as needed for cough. (Patient not taking: Reported on 08/07/2022) 118 mL 0   No facility-administered medications prior to visit.     ROS Review of Systems  Constitutional:  Negative for activity change and appetite change.  HENT:   Negative for sinus pressure and sore throat.   Respiratory:  Negative for chest tightness, shortness of breath and wheezing.   Cardiovascular:  Negative for chest pain and palpitations.  Gastrointestinal:  Negative for abdominal distention, abdominal pain and constipation.  Genitourinary: Negative.   Musculoskeletal: Negative.   Skin:  Positive for rash.  Psychiatric/Behavioral:  Negative for behavioral problems and dysphoric mood.     Objective:  BP 123/87   Pulse 100   Wt 279 lb 12.8 oz (126.9 kg)   LMP 07/02/2022 (Approximate)   SpO2 95%   BMI 48.03 kg/m      08/07/2022   10:59 AM 07/21/2022    7:48 PM 07/21/2022    6:06 PM  BP/Weight  Systolic BP 161 096 045  Diastolic BP 87 92 94  Wt. (Lbs) 279.8    BMI 48.03 kg/m2        Physical Exam Constitutional:      Appearance: She is well-developed.  Cardiovascular:     Rate and Rhythm: Normal rate.     Heart sounds: Normal heart sounds. No murmur heard. Pulmonary:     Effort: Pulmonary effort is normal.     Breath sounds: Normal breath sounds. No wheezing or rales.  Chest:     Chest wall: No tenderness.  Abdominal:     General: Bowel sounds are normal. There is no distension.     Palpations: Abdomen is soft. There is no mass.     Tenderness: There is no abdominal tenderness.  Musculoskeletal:        General: Normal range of motion.     Right lower leg: No edema.     Left lower leg: No edema.  Skin:    Comments: Skin rash on posterior inner thighs  Neurological:     Mental Status: She is alert and oriented to person, place, and time.  Psychiatric:        Mood and Affect: Mood normal.        Latest Ref Rng & Units 04/27/2022    2:15 PM 04/06/2021   12:09 PM 01/26/2019   11:58 AM  CMP  Glucose 70 - 99 mg/dL 87  91  95   BUN 6 - 20 mg/dL '9  10  8   '$ Creatinine 0.44 - 1.00 mg/dL 0.87  0.74  0.70   Sodium 135 - 145 mmol/L 139  138  137   Potassium 3.5 - 5.1 mmol/L 5.2  4.5  4.0   Chloride 98 - 111 mmol/L 104   105  108   CO2 22 - 32 mmol/L '23  23  22   '$ Calcium 8.9 -  10.3 mg/dL 9.5  9.4  8.9   Total Protein 6.0 - 8.5 g/dL  6.8    Total Bilirubin 0.0 - 1.2 mg/dL  0.3    Alkaline Phos 44 - 121 IU/L  86    AST 0 - 40 IU/L  19    ALT 0 - 32 IU/L  10      Lipid Panel     Component Value Date/Time   CHOL 199 04/06/2021 1209   TRIG 104 04/06/2021 1209   HDL 40 04/06/2021 1209   CHOLHDL 5.0 (H) 04/06/2021 1209   LDLCALC 140 (H) 04/06/2021 1209    CBC    Component Value Date/Time   WBC 7.9 04/27/2022 1235   RBC 4.37 04/27/2022 1235   HGB 12.3 04/27/2022 1235   HGB 11.9 04/06/2021 1209   HCT 41.3 04/27/2022 1235   HCT 38.0 04/06/2021 1209   PLT 112 (L) 04/27/2022 1235   PLT 367 04/06/2021 1209   MCV 94.5 04/27/2022 1235   MCV 89 04/06/2021 1209   MCH 28.1 04/27/2022 1235   MCHC 29.8 (L) 04/27/2022 1235   RDW 12.6 04/27/2022 1235   RDW 11.9 04/06/2021 1209   LYMPHSABS 2.6 04/06/2021 1209   MONOABS 0.4 01/11/2014 1026   EOSABS 0.1 04/06/2021 1209   BASOSABS 0.0 04/06/2021 1209    Lab Results  Component Value Date   HGBA1C 5.2 05/01/2021    Assessment & Plan:  1. Vaginal candidiasis With associated spread to the inner thighs Will place on nystatin Advised against wearing tight clothing - nystatin cream (MYCOSTATIN); Apply 1 Application topically 2 (two) times daily.  Dispense: 30 g; Refill: 1  2. Abnormal menstruation Last month she did have her menstrual cycle twice Advised to keep a menstrual diary which will be reviewed at her next visit in 1 month Counseled on various factors that can influence menstruation If menstrual abnormalities persist consider pelvic ultrasound    Meds ordered this encounter  Medications   DISCONTD: clotrimazole (LOTRIMIN) 1 % cream    Sig: Apply 1 Application topically 2 (two) times daily.    Dispense:  60 g    Refill:  1   nystatin cream (MYCOSTATIN)    Sig: Apply 1 Application topically 2 (two) times daily.    Dispense:  30 g     Refill:  1    Discontinue clotrimazole    Follow-up: Return for Physical, cancel appointment with Levada Dy later this month, keep appointment with PCP for 09/01/2022.       Charlott Rakes, MD, FAAFP. [facility] [place], [place]   08/07/2022, 11:26 AM

## 2022-08-07 NOTE — Patient Instructions (Signed)

## 2022-08-09 ENCOUNTER — Ambulatory Visit
Admission: EM | Admit: 2022-08-09 | Discharge: 2022-08-09 | Disposition: A | Discharge status: Home / Self Care | Payer: Medicaid Other | Attending: Nurse Practitioner | Admitting: Nurse Practitioner

## 2022-08-09 DIAGNOSIS — K219 Gastro-esophageal reflux disease without esophagitis: Secondary | ICD-10-CM

## 2022-08-09 DIAGNOSIS — R142 Eructation: Secondary | ICD-10-CM

## 2022-08-09 DIAGNOSIS — R09A2 Foreign body sensation, throat: Secondary | ICD-10-CM | POA: Diagnosis not present

## 2022-08-09 MED ORDER — ALUM & MAG HYDROXIDE-SIMETH 200-200-20 MG/5ML PO SUSP: 30.0000 mL | Freq: Once | ORAL | Status: DC

## 2022-08-09 MED ORDER — OMEPRAZOLE 20 MG PO CPDR: 20.0000 mg | DELAYED_RELEASE_CAPSULE | Freq: Every day | ORAL | 0 refills | Status: DC

## 2022-08-09 MED ORDER — ALUMINUM & MAGNESIUM HYDROXIDE 200-200 MG/5ML PO SUSP
30.0000 mL | Freq: Once | ORAL | Status: AC
  Administered 2022-08-09: 30 mL via ORAL

## 2022-08-09 MED ORDER — LIDOCAINE VISCOUS HCL 2 % MT SOLN
15.0000 mL | Freq: Once | OROMUCOSAL | Status: AC
  Administered 2022-08-09: 15 mL via OROMUCOSAL

## 2022-08-09 NOTE — Discharge Instructions (Addendum)
Start omeprazole daily for 10 days Follow-up with your PCP in 2 days for recheck Please go to the emergency room for any worsening symptoms.  This includes but is not limited to throat/tongue/lip swelling, difficulty swallowing, shortness of breath or breathing difficulty, or any new concerns that arise

## 2022-08-09 NOTE — ED Triage Notes (Signed)
The pt c/o constant belching, and feels like something is in her throat. Pt denies SOB, there are not active sings of SOB.   Home interventions: tums  Started: Tuesday

## 2022-08-09 NOTE — ED Provider Notes (Signed)
[facility]    CSN: [id number] Arrival date & time: 08/09/22  1219      History   Chief Complaint No chief complaint on file.   HPJasmine E Thomas is a 42 y.o. female presents for evaluation of belching, GERD type symptoms, and globus sensation.  Patient reports 2 days ago she got the flu shot from her PCP.  Shortly after that she had belching with feeling of reflux and a globus sensation.  Denies throat swelling, tongue swelling, lip swelling.  No difficulty breathing or swallowing.  She does state swallowing liquids causes her no discomfort but when she swallows solid food she feels like it is going to "come right back up".  Denies any actual vomiting.  Does not take any medications for her GERD.  No history of hiatal hernia.  No URI symptoms including sore throat.  No history of thyroid nodules or enlargement.  She took some Tums for her symptoms with minimal improvement.  No other concerns at this time.  HPI  Past Medical History:  Diagnosis Date   Anemia    Anxiety    Anxiety    Panic attacks    Vertigo     Patient Active Problem List   Diagnosis Date Noted   Major depressive disorder, recurrent episode, moderate with anxious distress ([place]) 03/24/2020   Nonspecific abnormal electrocardiogram (ECG) (EKG) 02/26/2019   Morbid obesity ([place]) 02/26/2019   Chest discomfort 02/26/2019   Erroneous encounter - disregard 01/25/2019   GERD (gastroesophageal reflux disease) 04/20/2016   Cough 04/20/2016   IFG (impaired fasting glucose) 12/30/2013   Unspecified vitamin D deficiency 12/30/2013   Back muscle spasm 12/15/2013   Anxiety 04/10/2013    Past Surgical History:  Procedure Laterality Date   NO PAST SURGERIES      OB History     Gravida  3   Para  3   Term      Preterm      AB      Living         SAB      IAB      Ectopic      Multiple      Live Births               Home Medications    Prior to Admission medications    Medication Sig Start Date End Date Taking? Authorizing Provider  omeprazole (PRILOSEC) 20 MG capsule Take 1 capsule (20 mg total) by mouth daily for 10 days. 08/09/22 08/19/22 Yes Melynda Ripple, NP  fluticasone (FLONASE) 50 MCG/ACT nasal spray SHAKE LIQUID AND USE 2 SPRAYS IN [facility] NOSTRIL DAILY Patient not taking: Reported on 08/07/2022 05/24/22   Charlott Rakes, MD  hydrOXYzine (ATARAX) 25 MG tablet Take 1 tablet (25 mg total) by mouth 3 (three) times daily as needed. Patient not taking: Reported on 08/07/2022 05/24/22   Charlott Rakes, MD  ipratropium (ATROVENT) 0.06 % nasal spray Place 2 sprays into both nostrils 4 (four) times daily. As needed for nasal congestion, runny nose Patient not taking: Reported on 04/27/2022 07/03/21   Lynden Oxford Scales, PA-C  nystatin cream (MYCOSTATIN) Apply 1 Application topically 2 (two) times daily. 08/07/22   Charlott Rakes, MD  sertraline (ZOLOFT) 100 MG tablet TAKE 1 TABLET BY MOUTH EVERY DAY Patient not taking: Reported on 08/07/2022 05/29/22   Charlott Rakes, MD    Family History Family History  Adopted: Yes  Problem Relation Age of Onset   Diabetes Mother  Hypertension Mother    Heart disease Father    Seizures Father    Seizures Other    Heart failure Other    Diabetes Sister     Social History Social History   Tobacco Use   Smoking status: Never   Smokeless tobacco: Never  Vaping Use   Vaping Use: Never used  Substance Use Topics   Alcohol use: Not Currently    Alcohol/week: 1.0 standard drink of alcohol    Types: 1 Glasses of wine per week    Comment: social   Drug use: No     Allergies   Patient has no known allergies.   Review of Systems Review of Systems  HENT:         Globus sensation of throat and belching     Physical Exam Triage Vital Signs ED Triage Vitals  Enc Vitals Group     BP 08/09/22 1229 129/81     Pulse Rate 08/09/22 1229 (!) 106     Resp 08/09/22 1229 18     Temp 08/09/22 1229 98 F (36.7 C)      Temp Source 08/09/22 1229 Oral     SpO2 08/09/22 1229 97 %     Weight --      Height --      Head Circumference --      Peak Flow --      Pain Score 08/09/22 1228 0     Pain Loc --      Pain Edu? --      Excl. in [place]? --    No data found.  Updated Vital Signs BP 129/81 (BP Location: Right Arm)   Pulse (!) 106   Temp 98 F (36.7 C) (Oral)   Resp 18   LMP 07/22/2022 (Approximate)   SpO2 97%   Visual Acuity Right Eye Distance:   Left Eye Distance:   Bilateral Distance:    Right Eye Near:   Left Eye Near:    Bilateral Near:     Physical Exam Vitals and nursing note reviewed.  Constitutional:      Appearance: Normal appearance.  HENT:     Head: Normocephalic and atraumatic.     Mouth/Throat:     Mouth: Mucous membranes are moist. No angioedema.     Tongue: No lesions. Tongue does not deviate from midline.     Pharynx: Oropharynx is clear. Uvula midline. No pharyngeal swelling or uvula swelling.     Comments: Airways patent with midline uvula Eyes:     Pupils: Pupils are equal, round, and reactive to light.  Neck:     Thyroid: No thyroid mass, thyromegaly or thyroid tenderness.  Cardiovascular:     Rate and Rhythm: Normal rate.  Pulmonary:     Effort: Pulmonary effort is normal.  Musculoskeletal:     Cervical back: Full passive range of motion without pain, normal range of motion and neck supple.  Skin:    General: Skin is warm and dry.  Neurological:     General: No focal deficit present.     Mental Status: She is alert and oriented to person, place, and time.  Psychiatric:        Mood and Affect: Mood normal.        Behavior: Behavior normal.      UC Treatments / Results  Labs (all labs ordered are listed, but only abnormal results are displayed) Labs Reviewed - No data to display  EKG   Radiology No results  found.  Procedures Procedures (including critical care time)  Medications Ordered in UC Medications  lidocaine (XYLOCAINE) 2 %  viscous mouth solution 15 mL (15 mLs Mouth/Throat Given 08/09/22 1248)  aluminum-magnesium hydroxide 200-200 MG/5ML suspension 30 mL (30 mLs Oral Given 08/09/22 1258)    Initial Impression / Assessment and Plan / UC Course  I have reviewed the triage vital signs and the nursing notes.  Pertinent labs & imaging results that were available during my care of the patient were reviewed by me and considered in my medical decision making (see chart for details).     Reviewed exam and symptoms with patient.  Denies any throat swelling or breathing issues.  No red flags on exam. Discussed likely GERD related.  Patient given GI cocktail in clinic Advised to start omeprazole daily for the next 7 days She will follow-up with her PCP in 2 days for recheck Strict ER precautions reviewed and patient verbalized understanding  Final Clinical Impressions(s) / UC Diagnoses   Final diagnoses:  Belching  Globus sensation  Gastroesophageal reflux disease, unspecified whether esophagitis present     Discharge Instructions      Start omeprazole daily for 10 days Follow-up with your PCP in 2 days for recheck Please go to the emergency room for any worsening symptoms.  This includes but is not limited to throat/tongue/lip swelling, difficulty swallowing, shortness of breath or breathing difficulty, or any new concerns that arise      ED Prescriptions     Medication Sig Dispense Auth. Provider   omeprazole (PRILOSEC) 20 MG capsule Take 1 capsule (20 mg total) by mouth daily for 10 days. 10 capsule Melynda Ripple, NP      PDMP not reviewed this encounter.   Melynda Ripple, NP 08/09/22 1310

## 2022-08-13 ENCOUNTER — Ambulatory Visit: Payer: Self-pay | Admitting: *Deleted

## 2022-08-13 NOTE — Telephone Encounter (Signed)
See triage note -  This encounter was created in error - please disregard.

## 2022-08-13 NOTE — Telephone Encounter (Signed)
2nd call to patient on [phone] to review sx of cough. No answer, LVMTCB [phone].

## 2022-08-13 NOTE — Telephone Encounter (Signed)
  Chief Complaint: Lingering cough Symptoms: Coughs when she goes out side, Coughing now and again. Sometimes brings up clear mucous Frequency: 6 months Pertinent Negatives: Patient denies fever Disposition: '[]'$ ED /'[]'$ Urgent Care (no appt availability in office) / '[]'$ Appointment(In office/virtual)/ '[]'$  [facility]/ '[]'$ Home Care/ '[]'$ Refused Recommended Disposition /'[]'$ [facility]/ '[x]'$  Follow-up with PCP Additional Notes: Pt reports that she has had a lingering cough after flu and URI recently.  Pt would like a refill on inhaler and cough medicine.  Please advise.

## 2022-08-13 NOTE — Telephone Encounter (Signed)
Summary: still having the coug   Reason for CRM: Pt asked if any provider can squeeze her in asap for the issue she was seen at [facility] for / she stated she is still having the cough or issue / please advise/ advised pt of mobil unit but she declined   **Copy from [place] patient [phone] regarding sx cough. No answer, LVMTCB [phone].

## 2022-08-13 NOTE — Telephone Encounter (Signed)
Summary: still having the coug   Reason for CRM: Pt asked if any provider can squeeze her in asap for the issue she was seen at [facility] for / she stated she is still having the cough or issue / please advise/ advised pt of mobil unit but she declined   **Copy from CRM     Reason for Disposition  Cough lasts > 3 weeks  Answer Assessment - Initial Assessment Questions 1. ONSET: "When did the cough begin?"      6 months ago 2. SEVERITY: "How bad is the cough today?"      Intermittent 3. SPUTUM: "Describe the color of your sputum" (none, dry cough; clear, white, yellow, green)     clear 4. HEMOPTYSIS: "Are you coughing up any blood?" If so ask: "How much?" (flecks, streaks, tablespoons, etc.)     no 5. DIFFICULTY BREATHING: "Are you having difficulty breathing?" If Yes, ask: "How bad is it?" (e.g., mild, moderate, severe)    - MILD: No SOB at rest, mild SOB with walking, speaks normally in sentences, can lie down, no retractions, pulse < 100.    - MODERATE: SOB at rest, SOB with minimal exertion and prefers to sit, cannot lie down flat, speaks in phrases, mild retractions, audible wheezing, pulse 100-120.    - SEVERE: Very SOB at rest, speaks in single words, struggling to breathe, sitting hunched forward, retractions, pulse > 120      mild 6. FEVER: "Do you have a fever?" If Yes, ask: "What is your temperature, how was it measured, and when did it start?"     no 7. CARDIAC HISTORY: "Do you have any history of heart disease?" (e.g., heart attack, congestive heart failure)       8. LUNG HISTORY: "Do you have any history of lung disease?"  (e.g., pulmonary embolus, asthma, emphysema)      9. PE RISK FACTORS: "Do you have a history of blood clots?" (or: recent major surgery, recent prolonged travel, bedridden)      10. OTHER SYMPTOMS: "Do you have any other symptoms?" (e.g., runny nose, wheezing, chest pain)        11. PREGNANCY: "Is there any chance you are pregnant?" "When was your last  menstrual period?"        12. TRAVEL: "Have you traveled out of the country in the last month?" (e.g., travel history, exposures)  Protocols used: Cough - Chronic-A-AH

## 2022-08-16 ENCOUNTER — Ambulatory Visit: Payer: Medicaid Other | Admitting: Physician Assistant

## 2022-09-06 ENCOUNTER — Encounter: Payer: Self-pay | Admitting: Family Medicine

## 2022-09-06 ENCOUNTER — Ambulatory Visit: Payer: Medicaid Other | Attending: Family Medicine | Admitting: Family Medicine

## 2022-09-06 ENCOUNTER — Other Ambulatory Visit: Payer: Self-pay | Admitting: Family Medicine

## 2022-09-06 ENCOUNTER — Other Ambulatory Visit (HOSPITAL_COMMUNITY)
Admission: RE | Admit: 2022-09-06 | Discharge: 2022-09-06 | Disposition: A | Discharge status: Home / Self Care | Payer: Medicaid Other | Source: Ambulatory Visit | Attending: Family Medicine | Admitting: Family Medicine

## 2022-09-06 VITALS — BP 120/85 | HR 92 | Temp 98.0°F | Ht 64.0 in | Wt 279.2 lb

## 2022-09-06 DIAGNOSIS — Z1231 Encounter for screening mammogram for malignant neoplasm of breast: Secondary | ICD-10-CM

## 2022-09-06 DIAGNOSIS — E875 Hyperkalemia: Secondary | ICD-10-CM | POA: Diagnosis not present

## 2022-09-06 DIAGNOSIS — Z1159 Encounter for screening for other viral diseases: Secondary | ICD-10-CM | POA: Diagnosis not present

## 2022-09-06 DIAGNOSIS — Z0001 Encounter for general adult medical examination with abnormal findings: Secondary | ICD-10-CM | POA: Diagnosis not present

## 2022-09-06 DIAGNOSIS — R059 Cough, unspecified: Secondary | ICD-10-CM

## 2022-09-06 DIAGNOSIS — K219 Gastro-esophageal reflux disease without esophagitis: Secondary | ICD-10-CM | POA: Diagnosis not present

## 2022-09-06 DIAGNOSIS — M545 Low back pain, unspecified: Secondary | ICD-10-CM | POA: Diagnosis not present

## 2022-09-06 DIAGNOSIS — F411 Generalized anxiety disorder: Secondary | ICD-10-CM | POA: Diagnosis not present

## 2022-09-06 DIAGNOSIS — Z6841 Body Mass Index (BMI) 40.0 and over, adult: Secondary | ICD-10-CM | POA: Diagnosis not present

## 2022-09-06 DIAGNOSIS — Z124 Encounter for screening for malignant neoplasm of cervix: Secondary | ICD-10-CM

## 2022-09-06 DIAGNOSIS — E6609 Other obesity due to excess calories: Secondary | ICD-10-CM

## 2022-09-06 MED ORDER — HYDROXYZINE HCL 25 MG PO TABS: 25.0000 mg | ORAL_TABLET | Freq: Three times a day (TID) | ORAL | 1 refills | Status: DC | PRN

## 2022-09-06 MED ORDER — LIDOCAINE 5 % EX PTCH: 1.0000 | MEDICATED_PATCH | CUTANEOUS | 0 refills | Status: DC

## 2022-09-06 MED ORDER — SERTRALINE HCL 50 MG PO TABS: ORAL_TABLET | ORAL | 1 refills | Status: DC

## 2022-09-06 MED ORDER — CETIRIZINE HCL 10 MG PO TABS: 10.0000 mg | ORAL_TABLET | Freq: Every day | ORAL | 1 refills | Status: DC

## 2022-09-06 MED ORDER — OMEPRAZOLE 20 MG PO CPDR: 20.0000 mg | DELAYED_RELEASE_CAPSULE | Freq: Every day | ORAL | 0 refills | Status: DC

## 2022-09-06 NOTE — Progress Notes (Signed)
Subjective:  Patient ID: Breanna Thomas, female    DOB: [birth year]  Age: 42 y.o. MRN: [id number]  CC: Annual Exam and Gynecologic Exam   HPI PARNELL ELDER is a 42 y.o. year old female with a history of neurolyse anxiety this order, obesity here for annual physical exam.  Interval History:  She has had 'a dry cough forever', sore throat as well and sometimes cough is productive of white sputum. She has a sour taste in her mouth, burping and this is intermittent. Spicy foods worsen her symptoms. She has no abdominal pain, no bloating.  For the last 3 weeks she has had lower back pain worse on lying down and relieved with walking. Pain does not radiate. Pain is mild to moderate.  She denies heavy lifting.  She has been worrying a lot but has not been having panic attacks.  She stopped taking the Zoloft and hydroxyzine.  She is wondering if she can do half of her dose of Zoloft along with hydroxyzine. Past Medical History:  Diagnosis Date   Anemia    Anxiety    Anxiety    Panic attacks    Vertigo     Past Surgical History:  Procedure Laterality Date   NO PAST SURGERIES      Family History  Adopted: Yes  Problem Relation Age of Onset   Diabetes Mother    Hypertension Mother    Heart disease Father    Seizures Father    Seizures Other    Heart failure Other    Diabetes Sister     Social History   Socioeconomic History   Marital status: Divorced    Spouse name: Not on file   Number of children: 3   Years of education: College   Highest education level: Not on file  Occupational History    Employer: [employer]    Comment: [employer]  Tobacco Use   Smoking status: Never   Smokeless tobacco: Never  Vaping Use   Vaping Use: Never used  Substance and Sexual Activity   Alcohol use: Not Currently    Alcohol/week: 1.0 standard drink of alcohol    Types: 1 Glasses of wine per week    Comment: social   Drug use: No   Sexual activity: Not Currently     Birth control/protection: None  Other Topics Concern   Not on file  Social History Narrative   Patient lives at home alone.   Caffeine Use: none in the past 2 weeks, soda occasionally   Social Determinants of Health   Financial Resource Strain: Not on file  Food Insecurity: Not on file  Transportation Needs: Not on file  Physical Activity: Not on file  Stress: Not on file  Social Connections: Not on file    No Known Allergies  Outpatient Medications Prior to Visit  Medication Sig Dispense Refill   fluticasone (FLONASE) 50 MCG/ACT nasal spray SHAKE LIQUID AND USE 2 SPRAYS IN EACH NOSTRIL DAILY (Patient not taking: Reported on 08/07/2022) 48 g 0   ipratropium (ATROVENT) 0.06 % nasal spray Place 2 sprays into both nostrils 4 (four) times daily. As needed for nasal congestion, runny nose (Patient not taking: Reported on 04/27/2022) 15 mL 0   nystatin cream (MYCOSTATIN) Apply 1 Application topically 2 (two) times daily. (Patient not taking: Reported on 09/06/2022) 30 g 1   hydrOXYzine (ATARAX) 25 MG tablet Take 1 tablet (25 mg total) by mouth 3 (three) times daily as needed. (Patient not  taking: Reported on 08/07/2022) 60 tablet 1   omeprazole (PRILOSEC) 20 MG capsule Take 1 capsule (20 mg total) by mouth daily for 10 days. 10 capsule 0   sertraline (ZOLOFT) 100 MG tablet TAKE 1 TABLET BY MOUTH EVERY DAY (Patient not taking: Reported on 08/07/2022) 90 tablet 1   No facility-administered medications prior to visit.     ROS Review of Systems  Constitutional:  Negative for activity change, appetite change and fatigue.  HENT:  Positive for sore throat. Negative for congestion and sinus pressure.   Eyes:  Negative for visual disturbance.  Respiratory:  Positive for cough. Negative for chest tightness, shortness of breath and wheezing.   Cardiovascular:  Negative for chest pain and palpitations.  Gastrointestinal:  Negative for abdominal distention, abdominal pain and constipation.   Endocrine: Negative for polydipsia.  Genitourinary:  Negative for dysuria and frequency.  Musculoskeletal:  Positive for back pain. Negative for arthralgias.  Skin:  Negative for rash.  Neurological:  Negative for tremors, light-headedness and numbness.  Hematological:  Does not bruise/bleed easily.  Psychiatric/Behavioral:  Negative for agitation, behavioral problems and dysphoric mood.     Objective:  BP 120/85   Pulse 92   Temp 98 F (36.7 C) (Oral)   Ht 5' 4"$  (1.626 m)   Wt 279 lb 3.2 oz (126.6 kg)   LMP 07/22/2022 (Approximate)   SpO2 100%   BMI 47.92 kg/m      09/06/2022    2:58 PM 08/09/2022   12:29 PM 08/07/2022   10:59 AM  BP/Weight  Systolic BP [id number]  Diastolic BP 85 81 87  Wt. (Lbs) 279.2  279.8  BMI 47.92 kg/m2  48.03 kg/m2      Physical Exam Exam conducted with a chaperone present.  Constitutional:      General: She is not in acute distress.    Appearance: She is well-developed. She is obese. She is not diaphoretic.  HENT:     Head: Normocephalic.     Right Ear: External ear normal.     Left Ear: External ear normal.     Nose: Nose normal.  Eyes:     Conjunctiva/sclera: Conjunctivae normal.     Pupils: Pupils are equal, round, and reactive to light.  Neck:     Vascular: No JVD.  Cardiovascular:     Rate and Rhythm: Normal rate and regular rhythm.     Heart sounds: Normal heart sounds. No murmur heard.    No gallop.  Pulmonary:     Effort: Pulmonary effort is normal. No respiratory distress.     Breath sounds: Normal breath sounds. No wheezing or rales.  Chest:     Chest wall: No tenderness.  Breasts:    Right: Normal. No mass, nipple discharge or tenderness.     Left: Normal. No mass, nipple discharge or tenderness.  Abdominal:     General: Bowel sounds are normal. There is no distension.     Palpations: Abdomen is soft. There is no mass.     Tenderness: There is no abdominal tenderness.     Hernia: There is no hernia in the left  inguinal area or right inguinal area.  Genitourinary:    General: Normal vulva.     Pubic Area: No rash.      Labia:        Right: No rash.        Left: No rash.      Vagina: Normal.     Cervix: Normal.  Uterus: Normal.      Adnexa: Right adnexa normal and left adnexa normal.       Right: No tenderness.         Left: No tenderness.    Musculoskeletal:        General: No tenderness. Normal range of motion.     Cervical back: Normal range of motion. No tenderness.  Lymphadenopathy:     Upper Body:     Right upper body: No supraclavicular or axillary adenopathy.     Left upper body: No supraclavicular or axillary adenopathy.  Skin:    General: Skin is warm and dry.  Neurological:     Mental Status: She is alert and oriented to person, place, and time.     Deep Tendon Reflexes: Reflexes are normal and symmetric.        Latest Ref Rng & Units 04/27/2022    2:15 PM 04/06/2021   12:09 PM 01/26/2019   11:58 AM  CMP  Glucose 70 - 99 mg/dL 87  91  95   BUN 6 - 20 mg/dL 9  10  8   $ Creatinine 0.44 - 1.00 mg/dL 0.87  0.74  0.70   Sodium 135 - 145 mmol/L 139  138  137   Potassium 3.5 - 5.1 mmol/L 5.2  4.5  4.0   Chloride 98 - 111 mmol/L 104  105  108   CO2 22 - 32 mmol/L 23  23  22   $ Calcium 8.9 - 10.3 mg/dL 9.5  9.4  8.9   Total Protein 6.0 - 8.5 g/dL  6.8    Total Bilirubin 0.0 - 1.2 mg/dL  0.3    Alkaline Phos 44 - 121 IU/L  86    AST 0 - 40 IU/L  19    ALT 0 - 32 IU/L  10      Lipid Panel     Component Value Date/Time   CHOL 199 04/06/2021 1209   TRIG 104 04/06/2021 1209   HDL 40 04/06/2021 1209   CHOLHDL 5.0 (H) 04/06/2021 1209   LDLCALC 140 (H) 04/06/2021 1209    CBC    Component Value Date/Time   WBC 7.9 04/27/2022 1235   RBC 4.37 04/27/2022 1235   HGB 12.3 04/27/2022 1235   HGB 11.9 04/06/2021 1209   HCT 41.3 04/27/2022 1235   HCT 38.0 04/06/2021 1209   PLT 112 (L) 04/27/2022 1235   PLT 367 04/06/2021 1209   MCV 94.5 04/27/2022 1235   MCV 89 04/06/2021  1209   MCH 28.1 04/27/2022 1235   MCHC 29.8 (L) 04/27/2022 1235   RDW 12.6 04/27/2022 1235   RDW 11.9 04/06/2021 1209   LYMPHSABS 2.6 04/06/2021 1209   MONOABS 0.4 01/11/2014 1026   EOSABS 0.1 04/06/2021 1209   BASOSABS 0.0 04/06/2021 1209    Lab Results  Component Value Date   HGBA1C 5.2 05/01/2021    Assessment & Plan:  1. Annual visit for general adult medical examination with abnormal findings Counseled on 150 minutes of exercise per week, healthy eating (including decreased daily intake of saturated fats, cholesterol, added sugars, sodium), routine healthcare maintenance.   2. Gastroesophageal reflux disease, unspecified whether esophagitis present Will need to evaluate for presence of H. Pylori Advised to avoid recumbency up to 2 hours postmeal, avoid late meals, avoid foods that trigger symptoms. - omeprazole (PRILOSEC) 20 MG capsule; Take 1 capsule (20 mg total) by mouth daily.  Dispense: 90 capsule; Refill: 0 - H. pylori breath test  3. GAD (generalized anxiety disorder) She endorses presence of ongoing warring Would like to restart Zoloft at a lower dose - sertraline (ZOLOFT) 50 MG tablet; TAKE 1 TABLET BY MOUTH EVERY DAY  Dispense: 90 tablet; Refill: 1 - hydrOXYzine (ATARAX) 25 MG tablet; Take 1 tablet (25 mg total) by mouth 3 (three) times daily as needed.  Dispense: 90 tablet; Refill: 1  4. Cough, unspecified type She states symptoms have been present since she received a flu shot 3 weeks ago It is possible she has some sinus component to it - cetirizine (ZYRTEC) 10 MG tablet; Take 1 tablet (10 mg total) by mouth daily.  Dispense: 30 tablet; Refill: 1  5. Acute midline low back pain without sciatica Weight loss will be beneficial Pain is positional - lidocaine (LIDODERM) 5 %; Place 1 patch onto the skin daily. Remove & Discard patch within 12 hours or as directed by MD  Dispense: 30 patch; Refill: 0  6. Screening for viral disease - HCV Ab w Reflex to Quant  PCR - HIV Antibody (routine testing w rflx)  7. Hyperkalemia Last potassium was 5.2 - Basic Metabolic Panel  8. Screening for cervical cancer - Cytology - PAP  9. Encounter for screening mammogram for malignant neoplasm of breast - MM 3D SCREEN BREAST BILATERAL; Future   Meds ordered this encounter  Medications   omeprazole (PRILOSEC) 20 MG capsule    Sig: Take 1 capsule (20 mg total) by mouth daily.    Dispense:  90 capsule    Refill:  0   cetirizine (ZYRTEC) 10 MG tablet    Sig: Take 1 tablet (10 mg total) by mouth daily.    Dispense:  30 tablet    Refill:  1   lidocaine (LIDODERM) 5 %    Sig: Place 1 patch onto the skin daily. Remove & Discard patch within 12 hours or as directed by MD    Dispense:  30 patch    Refill:  0   sertraline (ZOLOFT) 50 MG tablet    Sig: TAKE 1 TABLET BY MOUTH EVERY DAY    Dispense:  90 tablet    Refill:  1   hydrOXYzine (ATARAX) 25 MG tablet    Sig: Take 1 tablet (25 mg total) by mouth 3 (three) times daily as needed.    Dispense:  90 tablet    Refill:  1    Follow-up: Return in about 6 months (around 03/07/2023) for Chronic medical conditions.       Charlott Rakes, MD, FAAFP. [facility] and [place], [place]   09/06/2022, 4:08 PM

## 2022-09-06 NOTE — Telephone Encounter (Signed)
Reordered 09/06/22 #30 1 RF  Requested Prescriptions  Refused Prescriptions Disp Refills   cetirizine (ZYRTEC) 10 MG tablet [Pharmacy Med Name: CETIRIZINE 10MG TABLETS] 90 tablet     Sig: TAKE 1 TABLET(10 MG) BY MOUTH DAILY     Ear, Nose, and Throat:  Antihistamines 2 Passed - 09/06/2022  4:12 PM      Passed - Cr in normal range and within 360 days    Creat  Date Value Ref Range Status  12/15/2013 0.68 0.50 - 1.10 mg/dL Final   Creatinine, Ser  Date Value Ref Range Status  04/27/2022 0.87 0.44 - 1.00 mg/dL Final         Passed - Valid encounter within last 12 months    Recent Outpatient Visits           Today Annual visit for general adult medical examination with abnormal findings   [place], Enobong, MD   1 month ago Vaginal candidiasis   [place], Enobong, MD   3 months ago GAD (generalized anxiety disorder)   [place], Enobong, MD   4 months ago Urinary frequency   [place] Kerin Perna, NP   1 year ago Annual physical exam   [place], MD       Future Appointments             In 6 months Charlott Rakes, MD [place]

## 2022-09-06 NOTE — Patient Instructions (Signed)

## 2022-09-06 NOTE — Progress Notes (Signed)
Sore throat Dry cough.

## 2022-09-07 LAB — BASIC METABOLIC PANEL
BUN/Creatinine Ratio: 13 (ref 9–23)
BUN: 11 mg/dL (ref 6–24)
CO2: 21 mmol/L (ref 20–29)
Calcium: 8.8 mg/dL (ref 8.7–10.2)
Chloride: 103 mmol/L (ref 96–106)
Creatinine, Ser: 0.87 mg/dL (ref 0.57–1.00)
Glucose: 90 mg/dL (ref 70–99)
Potassium: 4.3 mmol/L (ref 3.5–5.2)
Sodium: 137 mmol/L (ref 134–144)
eGFR: 86 mL/min/{1.73_m2} (ref >59)

## 2022-09-07 LAB — HCV INTERPRETATION

## 2022-09-07 LAB — HIV ANTIBODY (ROUTINE TESTING W REFLEX): HIV Screen 4th Generation wRfx: NONREACTIVE

## 2022-09-07 LAB — HCV AB W REFLEX TO QUANT PCR: HCV Ab: NONREACTIVE

## 2022-09-08 LAB — H. PYLORI BREATH TEST: H pylori Breath Test: NEGATIVE

## 2022-09-10 LAB — CYTOLOGY - PAP
Comment: NEGATIVE
Diagnosis: NEGATIVE
High risk HPV: NEGATIVE

## 2022-09-19 ENCOUNTER — Encounter: Payer: Medicaid Other | Admitting: Family Medicine

## 2022-10-19 ENCOUNTER — Ambulatory Visit: Payer: Medicaid Other

## 2022-12-05 ENCOUNTER — Ambulatory Visit
Admission: RE | Admit: 2022-12-05 | Discharge: 2022-12-05 | Disposition: A | Discharge status: Home / Self Care | Payer: Medicaid Other | Source: Ambulatory Visit | Attending: Family Medicine | Admitting: Family Medicine

## 2022-12-05 DIAGNOSIS — Z1231 Encounter for screening mammogram for malignant neoplasm of breast: Secondary | ICD-10-CM

## 2022-12-15 ENCOUNTER — Other Ambulatory Visit: Payer: Self-pay | Admitting: Family Medicine

## 2022-12-15 DIAGNOSIS — K219 Gastro-esophageal reflux disease without esophagitis: Secondary | ICD-10-CM

## 2022-12-18 NOTE — Telephone Encounter (Signed)
Requested Prescriptions  Pending Prescriptions Disp Refills   omeprazole (PRILOSEC) 20 MG capsule [Pharmacy Med Name: OMEPRAZOLE 20MG  CAPSULES] 90 capsule 2    Sig: TAKE 1 CAPSULE(20 MG) BY MOUTH DAILY     Gastroenterology: Proton Pump Inhibitors Passed - 12/15/2022  3:01 PM      Passed - Valid encounter within last 12 months    Recent Outpatient Visits           3 months ago Annual visit for general adult medical examination with abnormal findings   [place] [facility] Hoy Register, MD   4 months ago Vaginal candidiasis   [place] [facility] Hoy Register, MD   6 months ago GAD (generalized anxiety disorder)   [place] [facility] Hoy Register, MD   7 months ago Urinary frequency   [facility] Grayce Sessions, NP   1 year ago Annual physical exam   [place] [facility] Hoy Register, MD       Future Appointments             In 2 months Hoy Register, MD [facility]

## 2022-12-21 ENCOUNTER — Other Ambulatory Visit: Payer: Self-pay | Admitting: Family Medicine

## 2022-12-21 DIAGNOSIS — F411 Generalized anxiety disorder: Secondary | ICD-10-CM

## 2022-12-21 NOTE — Telephone Encounter (Signed)
Requested medication (s) are due for refill today: no but requesting for further refill  Requested medication (s) are on the active medication list: yes  Last refill:  09/06/22 #90/1  Future visit scheduled: yes  Notes to clinic:  Unable to refill per protocol due to failed labs, no updated results.    Requested Prescriptions  Pending Prescriptions Disp Refills   sertraline (ZOLOFT) 50 MG tablet [Pharmacy Med Name: SERTRALINE 50MG  TABLETS] 90 tablet 1    Sig: TAKE 1 TABLET BY MOUTH EVERY DAY     Psychiatry:  Antidepressants - SSRI - sertraline Failed - 12/21/2022  8:01 AM      Failed - AST in normal range and within 360 days    AST  Date Value Ref Range Status  04/06/2021 19 0 - 40 IU/L Final         Failed - ALT in normal range and within 360 days    ALT  Date Value Ref Range Status  04/06/2021 10 0 - 32 IU/L Final         Passed - Completed PHQ-2 or PHQ-9 in the last 360 days      Passed - Valid encounter within last 6 months    Recent Outpatient Visits           3 months ago Annual visit for general adult medical examination with abnormal findings   [place] [facility] Hoy Register, MD   4 months ago Vaginal candidiasis   [place] [facility] Hoy Register, MD   7 months ago GAD (generalized anxiety disorder)   [place] [facility] Hoy Register, MD   7 months ago Urinary frequency   [facility] Grayce Sessions, NP   1 year ago Annual physical exam   [facility] Hoy Register, MD       Future Appointments             In 2 months Hoy Register, MD [facility]

## 2023-02-06 ENCOUNTER — Ambulatory Visit (INDEPENDENT_AMBULATORY_CARE_PROVIDER_SITE_OTHER): Payer: Medicaid Other

## 2023-02-06 ENCOUNTER — Ambulatory Visit
Admission: EM | Admit: 2023-02-06 | Discharge: 2023-02-06 | Disposition: A | Discharge status: Home / Self Care | Payer: Medicaid Other | Attending: Internal Medicine | Admitting: Internal Medicine

## 2023-02-06 DIAGNOSIS — M25511 Pain in right shoulder: Secondary | ICD-10-CM

## 2023-02-06 DIAGNOSIS — S40011A Contusion of right shoulder, initial encounter: Secondary | ICD-10-CM | POA: Diagnosis not present

## 2023-02-06 DIAGNOSIS — S42294B Other nondisplaced fracture of upper end of right humerus, initial encounter for open fracture: Secondary | ICD-10-CM

## 2023-02-06 MED ORDER — IBUPROFEN 800 MG PO TABS
800.0000 mg | ORAL_TABLET | Freq: Once | ORAL | Status: AC
  Administered 2023-02-06: 800 mg via ORAL

## 2023-02-06 MED ORDER — IBUPROFEN 800 MG PO TABS
800.0000 mg | ORAL_TABLET | Freq: Three times a day (TID) | ORAL | 0 refills | Status: DC | PRN
Start: 2023-02-06 — End: 2023-03-26

## 2023-02-06 NOTE — ED Triage Notes (Signed)
Pt presents to UC w/ c/o falling yesterday. Pt tripped over son's shoe. C/o right shoulder pain. Difficulty moving shoulder without pain. Tylenol last night.

## 2023-02-06 NOTE — ED Provider Notes (Signed)
[facility]    CSN: [id number] Arrival date & time: 02/06/23  4098      History   Chief Complaint No chief complaint on file.   HPI Breanna Thomas is a 42 y.o. female presents for evaluation of shoulder pain.  Patient reports last night she tripped over her son's shoe and fell onto her right shoulder.  Denies head injury or LOC.  Reports she heard a crack and is unable to move the right shoulder without physically lifting it with her other arm.  No bruising, swelling, numbness or tingling.  No neck or back pain.  No history of right shoulder injuries or surgeries in the past.  She took Tylenol last night for her symptoms.  No other concerns at this time.  HPI  Past Medical History:  Diagnosis Date   Anemia    Anxiety    Anxiety    Panic attacks    Vertigo     Patient Active Problem List   Diagnosis Date Noted   Major depressive disorder, recurrent episode, moderate with anxious distress (HCC) 03/24/2020   Nonspecific abnormal electrocardiogram (ECG) (EKG) 02/26/2019   Morbid obesity (HCC) 02/26/2019   Chest discomfort 02/26/2019   Erroneous encounter - disregard 01/25/2019   GERD (gastroesophageal reflux disease) 04/20/2016   Cough 04/20/2016   IFG (impaired fasting glucose) 12/30/2013   Unspecified vitamin D deficiency 12/30/2013   Back muscle spasm 12/15/2013   Anxiety 04/10/2013    Past Surgical History:  Procedure Laterality Date   NO PAST SURGERIES      OB History     Gravida  3   Para  3   Term      Preterm      AB      Living         SAB      IAB      Ectopic      Multiple      Live Births               Home Medications    Prior to Admission medications   Medication Sig Start Date End Date Taking? Authorizing Provider  ibuprofen (ADVIL) 800 MG tablet Take 1 tablet (800 mg total) by mouth every 8 (eight) hours as needed (shoulder pain). 02/06/23  Yes Radford Pax, NP  cetirizine (ZYRTEC) 10 MG tablet Take 1  tablet (10 mg total) by mouth daily. 09/06/22   Hoy Register, MD  fluticasone (FLONASE) 50 MCG/ACT nasal spray SHAKE LIQUID AND USE 2 SPRAYS IN EACH NOSTRIL DAILY Patient not taking: Reported on 08/07/2022 05/24/22   Hoy Register, MD  hydrOXYzine (ATARAX) 25 MG tablet Take 1 tablet (25 mg total) by mouth 3 (three) times daily as needed. 09/06/22   Hoy Register, MD  ipratropium (ATROVENT) 0.06 % nasal spray Place 2 sprays into both nostrils 4 (four) times daily. As needed for nasal congestion, runny nose Patient not taking: Reported on 04/27/2022 07/03/21   Theadora Rama Scales, PA-C  lidocaine (LIDODERM) 5 % Place 1 patch onto the skin daily. Remove & Discard patch within 12 hours or as directed by MD 09/06/22   Hoy Register, MD  nystatin cream (MYCOSTATIN) Apply 1 Application topically 2 (two) times daily. Patient not taking: Reported on 09/06/2022 08/07/22   Hoy Register, MD  omeprazole (PRILOSEC) 20 MG capsule TAKE 1 CAPSULE(20 MG) BY MOUTH DAILY 12/18/22   Hoy Register, MD  sertraline (ZOLOFT) 50 MG tablet TAKE 1 TABLET BY MOUTH EVERY  DAY 12/21/22   Hoy Register, MD    Family History Family History  Adopted: Yes  Problem Relation Age of Onset   Diabetes Mother    Hypertension Mother    Heart disease Father    Seizures Father    Seizures Other    Heart failure Other    Diabetes Sister     Social History Social History   Tobacco Use   Smoking status: Never   Smokeless tobacco: Never  Vaping Use   Vaping status: Never Used  Substance Use Topics   Alcohol use: Not Currently    Alcohol/week: 1.0 standard drink of alcohol    Types: 1 Glasses of wine per week    Comment: social   Drug use: No     Allergies   Patient has no known allergies.   Review of Systems Review of Systems  Musculoskeletal:        Right shoulder pain after fall      Physical Exam Triage Vital Signs ED Triage Vitals  Encounter Vitals Group     BP 02/06/23 0857 134/81      Systolic BP Percentile --      Diastolic BP Percentile --      Pulse Rate 02/06/23 0857 85     Resp 02/06/23 0857 16     Temp 02/06/23 0857 98.6 F (37 C)     Temp Source 02/06/23 0857 Oral     SpO2 02/06/23 0857 96 %     Weight --      Height --      Head Circumference --      Peak Flow --      Pain Score 02/06/23 0901 9     Pain Loc --      Pain Education --      Exclude from Growth Chart --    No data found.  Updated Vital Signs BP 134/81 (BP Location: Left Arm)   Pulse 85   Temp 98.6 F (37 C) (Oral)   Resp 16   LMP 01/20/2023 (Approximate)   SpO2 96%   Visual Acuity Right Eye Distance:   Left Eye Distance:   Bilateral Distance:    Right Eye Near:   Left Eye Near:    Bilateral Near:     Physical Exam Vitals and nursing note reviewed.  Constitutional:      General: She is not in acute distress.    Appearance: Normal appearance. She is not ill-appearing.  HENT:     Head: Normocephalic and atraumatic.  Eyes:     Pupils: Pupils are equal, round, and reactive to light.  Cardiovascular:     Rate and Rhythm: Normal rate.  Pulmonary:     Effort: Pulmonary effort is normal.  Musculoskeletal:     Right shoulder: Tenderness and bony tenderness present. No swelling, deformity, effusion, laceration or crepitus. Decreased range of motion. Normal strength. Normal pulse.       Arms:     Comments: Tender to palpation to lateral right shoulder.  No visible swelling or ecchymosis.  Patient unable to lift arm without assistance.  Strength is 5 out of 5 bilateral lower extremities.  Skin:    General: Skin is warm and dry.  Neurological:     General: No focal deficit present.     Mental Status: She is alert and oriented to person, place, and time.  Psychiatric:        Mood and Affect: Mood normal.  Behavior: Behavior normal.      UC Treatments / Results  Labs (all labs ordered are listed, but only abnormal results are displayed) Labs Reviewed - No data to  display  EKG   Radiology DG Shoulder Right  Result Date: 02/06/2023 CLINICAL DATA:  Fall onto right shoulder, pain EXAM: RIGHT SHOULDER - 2+ VIEW COMPARISON:  None Available. FINDINGS: There is no evidence of displaced fracture or dislocation. Appearance of the greater tuberosity of the humerus is suspicious for a nondisplaced fracture, best appreciated on Grashey view. There is no evidence of arthropathy or other focal bone abnormality. Soft tissues are unremarkable. IMPRESSION: Appearance of the greater tuberosity of the humerus is suspicious for a nondisplaced fracture, best appreciated on Grashey view. Consider CT to further evaluate. No dislocation of the right shoulder. These results will be called to the ordering clinician or representative by the Radiologist Assistant, and communication documented in the PACS or [employer]. Electronically Signed   By: Jearld Lesch M.D.   On: 02/06/2023 09:58    Procedures Procedures (including critical care time)  Medications Ordered in UC Medications  ibuprofen (ADVIL) tablet 800 mg (800 mg Oral Given 02/06/23 1006)    Initial Impression / Assessment and Plan / UC Course  I have reviewed the triage vital signs and the nursing notes.  Pertinent labs & imaging results that were available during my care of the patient were reviewed by me and considered in my medical decision making (see chart for details).     Reviewed symptoms and x-ray results with patient.  Possible humeral fracture on x-ray.  Will place in sling and referred to orthopedics for follow-up.  Patient was given ibuprofen in clinic and Rx sent to pharmacy.  Discussed RICE therapy.  PCP follow-up 1 week for recheck.  Strict ER precautions reviewed and patient verbalized understanding. Final Clinical Impressions(s) / UC Diagnoses   Final diagnoses:  Acute pain of right shoulder  Contusion of right shoulder, initial encounter  Other open nondisplaced fracture of proximal end of  right humerus, initial encounter     Discharge Instructions      Your x-ray shows a possible fracture to your upper arm bone.  Please contact orthopedics to make a follow-up appointment for further evaluation and treatment.  You may take ibuprofen every 8 hours as needed for pain.  You were given a dose while you are in the clinic.  Keep your right arm in the sling until you are seen by orthopedics.  You may do heat/cool compresses to the shoulder as needed.   Please go to the emergency room for any worsening symptoms.  I hope you feel better soon!     ED Prescriptions     Medication Sig Dispense Auth. Provider   ibuprofen (ADVIL) 800 MG tablet Take 1 tablet (800 mg total) by mouth every 8 (eight) hours as needed (shoulder pain). 21 tablet Radford Pax, NP      PDMP not reviewed this encounter.   Radford Pax, NP 02/06/23 1009

## 2023-02-06 NOTE — Discharge Instructions (Addendum)
Your x-ray shows a possible fracture to your upper arm bone.  Please contact orthopedics to make a follow-up appointment for further evaluation and treatment.  You may take ibuprofen every 8 hours as needed for pain.  You were given a dose while you are in the clinic.  Keep your right arm in the sling until you are seen by orthopedics.  You may do heat/cool compresses to the shoulder as needed.   Please go to the emergency room for any worsening symptoms.  I hope you feel better soon!

## 2023-02-07 ENCOUNTER — Ambulatory Visit: Payer: Medicaid Other | Admitting: Orthopaedic Surgery

## 2023-02-07 DIAGNOSIS — S42254A Nondisplaced fracture of greater tuberosity of right humerus, initial encounter for closed fracture: Secondary | ICD-10-CM | POA: Diagnosis not present

## 2023-02-07 NOTE — Progress Notes (Signed)
Office Visit Note   Patient: Breanna Thomas           Date of Birth: [birth year]           MRN: [id number] Visit Date: 02/07/2023              Requested by: Hoy Register, MD [address] [place] [place],  [place] [address] PCP: Hoy Register, MD   Assessment & Plan: Visit Diagnoses:  1. Nondisplaced fracture of greater tuberosity of right humerus, initial encounter for closed fracture     Plan: Elverta is a 2 days status post nondisplaced right greater tuberosity fracture.  This will be amenable to nonoperative treatment.  Activity restrictions and modifications reviewed.  She is to wear a sling at all times.  Follow-up in 2 weeks for repeat AP and scapular Y x-rays of the right shoulder.  Work note provided.  Follow-Up Instructions: Return in about 2 weeks (around 02/21/2023).   Orders:  No orders of the defined types were placed in this encounter.  No orders of the defined types were placed in this encounter.     Procedures: No procedures performed   Clinical Data: No additional findings.   Subjective: Chief Complaint  Patient presents with   Right Shoulder - Pain    HPI Breanna Thomas is a 42 year old female referral from the ER for right greater tuberosity fracture status post ground-level fall 2 days ago.  Reports swelling and pain around the shoulder. Review of Systems  Constitutional: Negative.   HENT: Negative.    Eyes: Negative.   Respiratory: Negative.    Cardiovascular: Negative.   Endocrine: Negative.   Musculoskeletal: Negative.   Neurological: Negative.   Hematological: Negative.   Psychiatric/Behavioral: Negative.    All other systems reviewed and are negative.    Objective: Vital Signs: LMP 01/20/2023 (Approximate)   Physical Exam Vitals and nursing note reviewed.  Constitutional:      Appearance: She is well-developed.  HENT:     Head: Atraumatic.     Nose: Nose normal.  Eyes:     Extraocular Movements: Extraocular movements  intact.  Cardiovascular:     Pulses: Normal pulses.  Pulmonary:     Effort: Pulmonary effort is normal.  Abdominal:     Palpations: Abdomen is soft.  Musculoskeletal:     Cervical back: Neck supple.  Skin:    General: Skin is warm.     Capillary Refill: Capillary refill takes less than 2 seconds.  Neurological:     Mental Status: She is alert. Mental status is at baseline.  Psychiatric:        Behavior: Behavior normal.        Thought Content: Thought content normal.        Judgment: Judgment normal.     Ortho Exam Examination right shoulder shows expected swelling and ecchymosis.  Limited range of motion secondary to pain.  No neurovascular compromise. Specialty Comments:  No specialty comments available.  Imaging: No results found.   PMFS History: Patient Active Problem List   Diagnosis Date Noted   Nondisplaced fracture of greater tuberosity of right humerus, initial encounter for closed fracture 02/07/2023   Major depressive disorder, recurrent episode, moderate with anxious distress (HCC) 03/24/2020   Nonspecific abnormal electrocardiogram (ECG) (EKG) 02/26/2019   Morbid obesity (HCC) 02/26/2019   Chest discomfort 02/26/2019   Erroneous encounter - disregard 01/25/2019   GERD (gastroesophageal reflux disease) 04/20/2016   Cough 04/20/2016   IFG (impaired fasting glucose) 12/30/2013  Unspecified vitamin D deficiency 12/30/2013   Back muscle spasm 12/15/2013   Anxiety 04/10/2013   Past Medical History:  Diagnosis Date   Anemia    Anxiety    Anxiety    Panic attacks    Vertigo     Family History  Adopted: Yes  Problem Relation Age of Onset   Diabetes Mother    Hypertension Mother    Heart disease Father    Seizures Father    Seizures Other    Heart failure Other    Diabetes Sister     Past Surgical History:  Procedure Laterality Date   NO PAST SURGERIES     Social History   Occupational History    Employer: [employer]    Comment: [employer]  Tobacco Use   Smoking status: Never   Smokeless tobacco: Never  Vaping Use   Vaping status: Never Used  Substance and Sexual Activity   Alcohol use: Not Currently    Alcohol/week: 1.0 standard drink of alcohol    Types: 1 Glasses of wine per week    Comment: social   Drug use: No   Sexual activity: Not Currently    Birth control/protection: None

## 2023-02-21 ENCOUNTER — Other Ambulatory Visit (INDEPENDENT_AMBULATORY_CARE_PROVIDER_SITE_OTHER): Payer: Medicaid Other

## 2023-02-21 ENCOUNTER — Ambulatory Visit: Payer: Medicaid Other | Admitting: Orthopaedic Surgery

## 2023-02-21 ENCOUNTER — Encounter: Payer: Self-pay | Admitting: Orthopaedic Surgery

## 2023-02-21 DIAGNOSIS — S42254A Nondisplaced fracture of greater tuberosity of right humerus, initial encounter for closed fracture: Secondary | ICD-10-CM | POA: Diagnosis not present

## 2023-02-21 NOTE — Progress Notes (Signed)
   Office Visit Note   Patient: Breanna Thomas           Date of Birth: 1981-04-22           MRN: [id number] Visit Date: 02/21/2023              Requested by: Hoy Register, MD [address] [place] [address] [place],  [place] [address] PCP: Hoy Register, MD   Assessment & Plan: Visit Diagnoses:  1. Nondisplaced fracture of greater tuberosity of right humerus, initial encounter for closed fracture     Plan: Breanna Thomas is 2 weeks status post nondisplaced right greater tuberosity fracture.  Range of motion improving as expected.  I will send a referral to OT to begin range of motion.  Recheck in 4 weeks with repeat AP and scapular Y x-rays of the right shoulder.  Follow-Up Instructions: Return in about 4 weeks (around 03/21/2023).   Orders:  Orders Placed This Encounter  Procedures   XR Shoulder Right   Ambulatory referral to Occupational Therapy   No orders of the defined types were placed in this encounter.     Procedures: No procedures performed   Clinical Data: No additional findings.   Subjective: Chief Complaint  Patient presents with   Right Shoulder - Follow-up    Humerus fracture    HPI Breanna Thomas returns today for follow-up of her right proximal humerus fracture.  She is doing okay overall. Review of Systems   Objective: Vital Signs: LMP 01/20/2023 (Approximate)   Physical Exam  Ortho Exam Examination right shoulder shows improvement in range of motion. Specialty Comments:  No specialty comments available.  Imaging: No results found.   PMFS History: Patient Active Problem List   Diagnosis Date Noted   Nondisplaced fracture of greater tuberosity of right humerus, initial encounter for closed fracture 02/07/2023   Major depressive disorder, recurrent episode, moderate with anxious distress (HCC) 03/24/2020   Nonspecific abnormal electrocardiogram (ECG) (EKG) 02/26/2019   Morbid obesity (HCC) 02/26/2019   Chest discomfort 02/26/2019   Erroneous  encounter - disregard 01/25/2019   GERD (gastroesophageal reflux disease) 04/20/2016   Cough 04/20/2016   IFG (impaired fasting glucose) 12/30/2013   Unspecified vitamin D deficiency 12/30/2013   Back muscle spasm 12/15/2013   Anxiety 04/10/2013   Past Medical History:  Diagnosis Date   Anemia    Anxiety    Anxiety    Panic attacks    Vertigo     Family History  Adopted: Yes  Problem Relation Age of Onset   Diabetes Mother    Hypertension Mother    Heart disease Father    Seizures Father    Seizures Other    Heart failure Other    Diabetes Sister     Past Surgical History:  Procedure Laterality Date   NO PAST SURGERIES     Social History   Occupational History    Employer: [employer]    Comment: [employer]  Tobacco Use   Smoking status: Never   Smokeless tobacco: Never  Vaping Use   Vaping status: Never Used  Substance and Sexual Activity   Alcohol use: Not Currently    Alcohol/week: 1.0 standard drink of alcohol    Types: 1 Glasses of wine per week    Comment: social   Drug use: No   Sexual activity: Not Currently    Birth control/protection: None

## 2023-03-05 ENCOUNTER — Encounter: Payer: Self-pay | Admitting: Occupational Therapy

## 2023-03-05 ENCOUNTER — Ambulatory Visit: Payer: Medicaid Other | Attending: Orthopaedic Surgery | Admitting: Occupational Therapy

## 2023-03-05 DIAGNOSIS — M25611 Stiffness of right shoulder, not elsewhere classified: Secondary | ICD-10-CM | POA: Insufficient documentation

## 2023-03-05 DIAGNOSIS — S42254A Nondisplaced fracture of greater tuberosity of right humerus, initial encounter for closed fracture: Secondary | ICD-10-CM | POA: Insufficient documentation

## 2023-03-05 DIAGNOSIS — M25511 Pain in right shoulder: Secondary | ICD-10-CM | POA: Diagnosis present

## 2023-03-05 DIAGNOSIS — M6281 Muscle weakness (generalized): Secondary | ICD-10-CM | POA: Insufficient documentation

## 2023-03-05 NOTE — Patient Instructions (Signed)
Gentle Right shoulder exercises  No strengthening yet.    Start with shoulder motion  Shoulder shrugs x 5 Squeeze shoulder blades together in back x 5  Sit at kitchen table and slide right arm on table surface.  Forward as far as comfortable - hold stretch x 15 seconds - can lean forward for a bigger stretch 10x's rest repeat x 3   Sideways - arc like a rainbow 10 x's x 3  Circle - can use left hand to help guide You can use a towel under your arm to help it slide.    Sharp pain means stop.  Stretching pain is good.

## 2023-03-05 NOTE — Addendum Note (Signed)
Addended by: Collier Salina on: 03/05/2023 01:18 PM   Modules accepted: Orders

## 2023-03-05 NOTE — Therapy (Signed)
OUTPATIENT OCCUPATIONAL THERAPY ORTHO EVALUATION  Patient Name: Breanna Thomas MRN: [id number] DOB:Jan 25, 1981, 42 y.o., female Today's Date: 03/05/2023  PCP: Hoy Register REFERRING PROVIDER: Gershon Mussel  END OF SESSION:  OT End of Session - 03/05/23 1236     Visit Number 1    Date for OT Re-Evaluation 05/04/23    Authorization Type Medicaid- [place] [employer]    Progress Note Due on Visit 10    OT Start Time 1114    OT Stop Time 1158    OT Time Calculation (min) 44 min    Activity Tolerance Patient tolerated treatment well    Behavior During Therapy WFL for tasks assessed/performed             Past Medical History:  Diagnosis Date   Anemia    Anxiety    Anxiety    Panic attacks    Vertigo    Past Surgical History:  Procedure Laterality Date   NO PAST SURGERIES     Patient Active Problem List   Diagnosis Date Noted   Nondisplaced fracture of greater tuberosity of right humerus, initial encounter for closed fracture 02/07/2023   Major depressive disorder, recurrent episode, moderate with anxious distress (HCC) 03/24/2020   Nonspecific abnormal electrocardiogram (ECG) (EKG) 02/26/2019   Morbid obesity (HCC) 02/26/2019   Chest discomfort 02/26/2019   Erroneous encounter - disregard 01/25/2019   GERD (gastroesophageal reflux disease) 04/20/2016   Cough 04/20/2016   IFG (impaired fasting glucose) 12/30/2013   Unspecified vitamin D deficiency 12/30/2013   Back muscle spasm 12/15/2013   Anxiety 04/10/2013    ONSET DATE: 02/05/2023  REFERRING DIAG: R Proximal humerus fracture  THERAPY DIAG:  Acute pain of right shoulder  Stiffness of right shoulder, not elsewhere classified  Muscle weakness (generalized)  Rationale for Evaluation and Treatment: Rehabilitation  SUBJECTIVE:   SUBJECTIVE STATEMENT: Eager to be able to raise arm overhead Pt accompanied by: self  PERTINENT HISTORY: No significant PMH   PRECAUTIONS: Other: No strengthening x  6 weeks - AAROM/PROM allowed at eval - DOI 02/05/23  RED FLAGS: None   WEIGHT BEARING RESTRICTIONS: No  PAIN:  Are you having pain? No  FALLS: Has patient fallen in last 6 months? Yes. Number of falls 1  LIVING ENVIRONMENT: Lives with: lives with their family Lives in: House/apartment Stairs: Yes: Internal: 10+ steps; on right going up and External: 4 steps; bilateral but cannot reach both Has following equipment at home: None  PLOF: Independent with basic ADLs  PATIENT GOALS: Get back to raising R arm up - using Right UE (Dominant)  NEXT MD VISIT: TBD- patient cannot recall  OBJECTIVE:   HAND DOMINANCE: Right  ADLs: Overall ADLs: Independent Transfers/ambulation related to ADLs: walks independently Eating: Independent Grooming: Independent UB Dressing: Independent LB Dressing: Independent Toileting: Independent Bathing: Independent Tub Shower transfers: Independent Equipment: none   UPPER EXTREMITY ROM:     Active Assisted ROM Right eval Left eval  Shoulder flexion 70   Shoulder abduction 55   Shoulder adduction    Shoulder extension 30   Shoulder internal rotation [facility]   Shoulder external rotation Neutral - humeral dependent position   Elbow flexion WFL   Elbow extension -15   Wrist flexion WFL   Wrist extension WFL   Wrist ulnar deviation    Wrist radial deviation    Wrist pronation WFL   Wrist supination WFL   (Blank rows = not tested)     HAND FUNCTION: Grip strength: Right: 43.2  lbs; Left: 47.2 lbs   SENSATION: WFL   COGNITION: Overall cognitive status: Within functional limits for tasks assessed      TODAY'S TREATMENT:                                                                                                                              DATE: 03/05/23 Patient educated in simple active assisted shoulder exercises at tabletop.  Patient without pain with AAROM, however attempts at AROM painful.  Patient with tension in biceps as  well - worked through stretching discomfort at biceps.  Discussed potential goals for OT - Reduce pain, improve range of motion, and finally strengthening once cleared.  Reviewed passive, AAROM versus AROM.     PATIENT EDUCATION: Education details: AAROM Person educated: Patient Education method: Programmer, multimedia, Facilities manager, Actor cues, Verbal cues, and Handouts Education comprehension: verbalized understanding, returned demonstration, and needs further education  HOME EXERCISE PROGRAM: See patient instructions  GOALS: Goals reviewed with patient? Yes  SHORT TERM GOALS: Target date: 04/05/23  Patient will complete HEP for right shoulder range of motion with intermittent cueing Baseline: No HEP Goal status: INITIAL  2.  Patient will demonstrate 90* of AAROM flexion in right shoulder without increase in pain Baseline: 70* flex Goal status: INITIAL  3.  Patient will demonstrate 28* AAROM Abduction R shoulder  Baseline: 55* Goal status: INITIAL     LONG TERM GOALS: Target date: 05/04/23  Patient will complete HEP designed to improve range of motion and strength in RUE Baseline: No HEP Goal status: INITIAL  2.  Patient will demonstrate sufficient AROM/ strength/ endurance to use right hand to comb hair by bringing hand to head Baseline: Unable to comb with R hand, or brings head to hand Goal status: INITIAL  3.  Patient will demonstrate sufficient AROM right shoulder flexion to reach into overhead shelf to obtain/replace a lightweight object - 2 lbs or less- without pain.   Baseline: Unable to reach overhead Goal status: INITIAL  4.  Patient will demonstrate improved right grip strength to 50 lbs to increase ability to open jars/ bottles Baseline: 43lbs right - dominant, 47 lbs left Goal status: INITIAL    ASSESSMENT:  CLINICAL IMPRESSION: Patient is a 42 y.o. female who was seen today for occupational therapy evaluation for R Proximal humerus fracture, following a  fall at home on 02/05/23.    PERFORMANCE DEFICITS: in functional skills including IADLs, coordination, edema, ROM, strength, pain, fascial restrictions, Gross motor control, and UE functional use. IMPAIRMENTS: are limiting patient from ADLs, IADLs, work, and leisure.   COMORBIDITIES: has no other co-morbidities that affects occupational performance. Patient will benefit from skilled OT to address above impairments and improve overall function.  MODIFICATION OR ASSISTANCE TO COMPLETE EVALUATION: No modification of tasks or assist necessary to complete an evaluation.  OT OCCUPATIONAL PROFILE AND HISTORY: Problem focused assessment: Including review of records relating to presenting problem.  CLINICAL DECISION MAKING: LOW - limited  treatment options, no task modification necessary  REHAB POTENTIAL: Good  EVALUATION COMPLEXITY: Low      PLAN:  OT FREQUENCY: 2x/week  OT DURATION: 8 weeks  PLANNED INTERVENTIONS: self care/ADL training, therapeutic exercise, therapeutic activity, manual therapy, passive range of motion, aquatic therapy, ultrasound, moist heat, cryotherapy, and patient/family education  RECOMMENDED OTHER SERVICES: NA  CONSULTED AND AGREED WITH PLAN OF CARE: Patient  PLAN FOR NEXT SESSION: Review tabletop shoulder slides, continue to progress PROM/AAROM R shoulder - keep adding to HEP.   Collier Salina, OT 03/05/2023, 12:37 PM

## 2023-03-11 ENCOUNTER — Ambulatory Visit: Payer: Medicaid Other | Admitting: Physician Assistant

## 2023-03-20 ENCOUNTER — Ambulatory Visit: Payer: Medicaid Other | Admitting: Orthopaedic Surgery

## 2023-03-26 ENCOUNTER — Encounter: Payer: Self-pay | Admitting: Orthopaedic Surgery

## 2023-03-26 ENCOUNTER — Other Ambulatory Visit (INDEPENDENT_AMBULATORY_CARE_PROVIDER_SITE_OTHER): Payer: Medicaid Other

## 2023-03-26 ENCOUNTER — Other Ambulatory Visit: Payer: Self-pay | Admitting: Physician Assistant

## 2023-03-26 ENCOUNTER — Telehealth: Payer: Self-pay | Admitting: Orthopaedic Surgery

## 2023-03-26 ENCOUNTER — Ambulatory Visit (INDEPENDENT_AMBULATORY_CARE_PROVIDER_SITE_OTHER): Payer: Medicaid Other | Admitting: Orthopaedic Surgery

## 2023-03-26 DIAGNOSIS — S42254A Nondisplaced fracture of greater tuberosity of right humerus, initial encounter for closed fracture: Secondary | ICD-10-CM

## 2023-03-26 DIAGNOSIS — S40011A Contusion of right shoulder, initial encounter: Secondary | ICD-10-CM

## 2023-03-26 MED ORDER — IBUPROFEN 800 MG PO TABS: 800.0000 mg | ORAL_TABLET | Freq: Three times a day (TID) | ORAL | 0 refills | Status: DC | PRN

## 2023-03-26 NOTE — Progress Notes (Signed)
Office Visit Note   Patient: Breanna Thomas           Date of Birth: 26-Jan-1981           MRN: [id number] Visit Date: 03/26/2023              Requested by: Hoy Register, MD [address] [place],  [place] [address] PCP: Hoy Register, MD   Assessment & Plan: Visit Diagnoses:  1. Nondisplaced fracture of greater tuberosity of right humerus, initial encounter for closed fracture     Plan: Maaria is now 6 weeks status post nondisplaced right greater tuberosity fracture.  OT starts tomorrow due to scheduling difficulties and conflicts.  Recheck in 6 weeks with scapular Y and Grashey x-rays.  Follow-Up Instructions: Return in about 6 weeks (around 05/07/2023).   Orders:  Orders Placed This Encounter  Procedures   XR Shoulder Right   No orders of the defined types were placed in this encounter.     Procedures: No procedures performed   Clinical Data: No additional findings.   Subjective: Chief Complaint  Patient presents with   Right Shoulder - Follow-up    Greater tuberosity humerus fracture    HPI Patient returns today for fracture follow-up.  She is 6 weeks from nondisplaced greater tuberosity fracture. Review of Systems  Constitutional: Negative.   HENT: Negative.    Eyes: Negative.   Respiratory: Negative.    Cardiovascular: Negative.   Endocrine: Negative.   Musculoskeletal: Negative.   Neurological: Negative.   Hematological: Negative.   Psychiatric/Behavioral: Negative.    All other systems reviewed and are negative.    Objective: Vital Signs: There were no vitals taken for this visit.  Physical Exam Vitals and nursing note reviewed.  Constitutional:      Appearance: She is well-developed.  HENT:     Head: Normocephalic and atraumatic.  Pulmonary:     Effort: Pulmonary effort is normal.  Abdominal:     Palpations: Abdomen is soft.  Musculoskeletal:     Cervical back: Neck supple.  Skin:    General: Skin is warm.      Capillary Refill: Capillary refill takes less than 2 seconds.  Neurological:     Mental Status: She is alert and oriented to person, place, and time.  Psychiatric:        Behavior: Behavior normal.        Thought Content: Thought content normal.        Judgment: Judgment normal.     Ortho Exam Examination of the right shoulder shows expected limitation range of motion and weakness with manual muscle testing. Specialty Comments:  No specialty comments available.  Imaging: XR Shoulder Right  Result Date: 03/26/2023 X-rays demonstrate healing nondisplaced greater tuberosity fracture without any complications.    PMFS History: Patient Active Problem List   Diagnosis Date Noted   Nondisplaced fracture of greater tuberosity of right humerus, initial encounter for closed fracture 02/07/2023   Major depressive disorder, recurrent episode, moderate with anxious distress (HCC) 03/24/2020   Nonspecific abnormal electrocardiogram (ECG) (EKG) 02/26/2019   Morbid obesity (HCC) 02/26/2019   Chest discomfort 02/26/2019   Erroneous encounter - disregard 01/25/2019   GERD (gastroesophageal reflux disease) 04/20/2016   Cough 04/20/2016   IFG (impaired fasting glucose) 12/30/2013   Unspecified vitamin D deficiency 12/30/2013   Back muscle spasm 12/15/2013   Anxiety 04/10/2013   Past Medical History:  Diagnosis Date   Anemia    Anxiety  Anxiety    Panic attacks    Vertigo     Family History  Adopted: Yes  Problem Relation Age of Onset   Diabetes Mother    Hypertension Mother    Heart disease Father    Seizures Father    Seizures Other    Heart failure Other    Diabetes Sister     Past Surgical History:  Procedure Laterality Date   NO PAST SURGERIES     Social History   Occupational History    Employer: [employer]    Comment: [employer]  Tobacco Use   Smoking status: Never   Smokeless tobacco: Never  Vaping Use   Vaping status: Never Used  Substance and Sexual  Activity   Alcohol use: Not Currently    Alcohol/week: 1.0 standard drink of alcohol    Types: 1 Glasses of wine per week    Comment: social   Drug use: No   Sexual activity: Not Currently    Birth control/protection: None

## 2023-03-26 NOTE — Telephone Encounter (Signed)
Pt came and requested a refill on Ibuprofen sent to [facility] on file unsure if she told Xu or not during appt

## 2023-03-26 NOTE — Telephone Encounter (Signed)
Notified patient via voicemail. 

## 2023-03-26 NOTE — Telephone Encounter (Signed)
sent 

## 2023-03-27 ENCOUNTER — Ambulatory Visit: Payer: Medicaid Other | Attending: Orthopaedic Surgery | Admitting: Occupational Therapy

## 2023-03-31 ENCOUNTER — Ambulatory Visit
Admission: RE | Admit: 2023-03-31 | Discharge: 2023-03-31 | Disposition: A | Discharge status: Home / Self Care | Payer: Medicaid Other | Source: Ambulatory Visit | Attending: Internal Medicine | Admitting: Internal Medicine

## 2023-03-31 VITALS — BP 125/85 | HR 88 | Temp 98.8°F | Resp 16

## 2023-03-31 DIAGNOSIS — L304 Erythema intertrigo: Secondary | ICD-10-CM | POA: Diagnosis not present

## 2023-03-31 MED ORDER — NYSTATIN 100000 UNIT/GM EX POWD
1.0000 | Freq: Three times a day (TID) | CUTANEOUS | 0 refills | Status: DC
Start: 2023-03-31 — End: 2023-05-19

## 2023-03-31 NOTE — Discharge Instructions (Signed)
Please try to keep the area clean and dry as possible.  You can use barrier cream or gauze.  Use the nystatin antifungal powder 3 times a day.  Follow-up with your PCP if your symptoms do not improve.  Please go to the emergency room for any worsening symptoms.  I hope you feel better soon!

## 2023-03-31 NOTE — ED Provider Notes (Signed)
[facility]    CSN: [id number] Arrival date & time: 03/31/23  1121      History   Chief Complaint Chief Complaint  Patient presents with   Rash    HPI Breanna Thomas is a 42 y.o. female presents for evaluation of a rash.  Patient reports a week of a pruritic/irritating/draining rash underneath the folds of her pannus.  She has been using hydrocortisone cream and gold powder without improvement.  No fevers or chills.  No history of eczema or psoriasis.  No other concerns at this time.   Rash   Past Medical History:  Diagnosis Date   Anemia    Anxiety    Anxiety    Panic attacks    Vertigo     Patient Active Problem List   Diagnosis Date Noted   Nondisplaced fracture of greater tuberosity of right humerus, initial encounter for closed fracture 02/07/2023   Major depressive disorder, recurrent episode, moderate with anxious distress (HCC) 03/24/2020   Nonspecific abnormal electrocardiogram (ECG) (EKG) 02/26/2019   Morbid obesity (HCC) 02/26/2019   Chest discomfort 02/26/2019   Erroneous encounter - disregard 01/25/2019   GERD (gastroesophageal reflux disease) 04/20/2016   Cough 04/20/2016   IFG (impaired fasting glucose) 12/30/2013   Unspecified vitamin D deficiency 12/30/2013   Back muscle spasm 12/15/2013   Anxiety 04/10/2013    Past Surgical History:  Procedure Laterality Date   NO PAST SURGERIES      OB History     Gravida  3   Para  3   Term      Preterm      AB      Living         SAB      IAB      Ectopic      Multiple      Live Births               Home Medications    Prior to Admission medications   Medication Sig Start Date End Date Taking? Authorizing Provider  nystatin (MYCOSTATIN/NYSTOP) powder Apply 1 Application topically 3 (three) times daily. 03/31/23  Yes Radford Pax, NP  cetirizine (ZYRTEC) 10 MG tablet Take 1 tablet (10 mg total) by mouth daily. 09/06/22   Hoy Register, MD  fluticasone (FLONASE)  50 MCG/ACT nasal spray SHAKE LIQUID AND USE 2 SPRAYS IN EACH NOSTRIL DAILY 05/24/22   Hoy Register, MD  hydrOXYzine (ATARAX) 25 MG tablet Take 1 tablet (25 mg total) by mouth 3 (three) times daily as needed. 09/06/22   Hoy Register, MD  ibuprofen (ADVIL) 800 MG tablet Take 1 tablet (800 mg total) by mouth every 8 (eight) hours as needed (shoulder pain). 03/26/23   Cristie Hem, PA-C  ipratropium (ATROVENT) 0.06 % nasal spray Place 2 sprays into both nostrils 4 (four) times daily. As needed for nasal congestion, runny nose 07/03/21   Theadora Rama Scales, PA-C  lidocaine (LIDODERM) 5 % Place 1 patch onto the skin daily. Remove & Discard patch within 12 hours or as directed by MD 09/06/22   Hoy Register, MD  omeprazole (PRILOSEC) 20 MG capsule TAKE 1 CAPSULE(20 MG) BY MOUTH DAILY 12/18/22   Hoy Register, MD  sertraline (ZOLOFT) 50 MG tablet TAKE 1 TABLET BY MOUTH EVERY DAY 12/21/22   Hoy Register, MD    Family History Family History  Adopted: Yes  Problem Relation Age of Onset   Diabetes Mother    Hypertension Mother  Heart disease Father    Seizures Father    Seizures Other    Heart failure Other    Diabetes Sister     Social History Social History   Tobacco Use   Smoking status: Never   Smokeless tobacco: Never  Vaping Use   Vaping status: Never Used  Substance Use Topics   Alcohol use: Not Currently    Alcohol/week: 1.0 standard drink of alcohol    Types: 1 Glasses of wine per week    Comment: social   Drug use: No     Allergies   Patient has no known allergies.   Review of Systems Review of Systems  Skin:  Positive for rash.     Physical Exam Triage Vital Signs ED Triage Vitals  Encounter Vitals Group     BP 03/31/23 1135 125/85     Systolic BP Percentile --      Diastolic BP Percentile --      Pulse Rate 03/31/23 1135 88     Resp 03/31/23 1135 16     Temp 03/31/23 1135 98.8 F (37.1 C)     Temp Source 03/31/23 1135 Oral     SpO2 03/31/23  1135 97 %     Weight --      Height --      Head Circumference --      Peak Flow --      Pain Score 03/31/23 1138 0     Pain Loc --      Pain Education --      Exclude from Growth Chart --    No data found.  Updated Vital Signs BP 125/85 (BP Location: Left Arm)   Pulse 88   Temp 98.8 F (37.1 C) (Oral)   Resp 16   SpO2 97%   Visual Acuity Right Eye Distance:   Left Eye Distance:   Bilateral Distance:    Right Eye Near:   Left Eye Near:    Bilateral Near:     Physical Exam Vitals and nursing note reviewed.  Constitutional:      General: She is not in acute distress.    Appearance: Normal appearance. She is obese. She is not ill-appearing.  HENT:     Head: Normocephalic and atraumatic.  Eyes:     Pupils: Pupils are equal, round, and reactive to light.  Cardiovascular:     Rate and Rhythm: Normal rate.  Pulmonary:     Effort: Pulmonary effort is normal.  Skin:    General: Skin is warm and dry.     Comments: There is a moist erythematous rash underneath the folds of the lower abdomen/pannus.  Neurological:     General: No focal deficit present.     Mental Status: She is alert and oriented to person, place, and time.  Psychiatric:        Mood and Affect: Mood normal.        Behavior: Behavior normal.      UC Treatments / Results  Labs (all labs ordered are listed, but only abnormal results are displayed) Labs Reviewed - No data to display  EKG   Radiology No results found.  Procedures Procedures (including critical care time)  Medications Ordered in UC Medications - No data to display  Initial Impression / Assessment and Plan / UC Course  I have reviewed the triage vital signs and the nursing notes.  Pertinent labs & imaging results that were available during my care of the patient were reviewed  by me and considered in my medical decision making (see chart for details).     Reviewed exam and symptoms with patient.  No red flags.  Discussed  intertrigo.  Will do nystatin powder.  Advised to use a barrier cream and/or barrier gauze to help keep area dry.  PCP follow-up 2 days for recheck.  ER precautions reviewed and patient verbalized understanding. Final Clinical Impressions(s) / UC Diagnoses   Final diagnoses:  Intertrigo     Discharge Instructions      Please try to keep the area clean and dry as possible.  You can use barrier cream or gauze.  Use the nystatin antifungal powder 3 times a day.  Follow-up with your PCP if your symptoms do not improve.  Please go to the emergency room for any worsening symptoms.  I hope you feel better soon!   ED Prescriptions     Medication Sig Dispense Auth. Provider   nystatin (MYCOSTATIN/NYSTOP) powder Apply 1 Application topically 3 (three) times daily. 30 g Radford Pax, NP      PDMP not reviewed this encounter.   Radford Pax, NP 03/31/23 [phone]

## 2023-03-31 NOTE — ED Triage Notes (Signed)
Pt presents to UC w/ c/o rash under abd x1 week. Pt has tried hydrocortisone cream and gold poweder without relief.

## 2023-04-01 ENCOUNTER — Telehealth: Payer: Self-pay | Admitting: Occupational Therapy

## 2023-04-01 ENCOUNTER — Encounter: Payer: Self-pay | Admitting: Occupational Therapy

## 2023-04-01 ENCOUNTER — Ambulatory Visit: Payer: Medicaid Other | Admitting: Occupational Therapy

## 2023-04-01 NOTE — Therapy (Signed)
Pt returned call requesting d/c at this time due to issues with scheduling.

## 2023-04-01 NOTE — Therapy (Deleted)
OUTPATIENT OCCUPATIONAL THERAPY ORTHO EVALUATION  Patient Name: Breanna Thomas MRN: [id number] DOB:1981/04/29, 42 y.o., female Today's Date: 04/01/2023  PCP: Hoy Register REFERRING PROVIDER: Gershon Mussel  END OF SESSION:    Past Medical History:  Diagnosis Date   Anemia    Anxiety    Anxiety    Panic attacks    Vertigo    Past Surgical History:  Procedure Laterality Date   NO PAST SURGERIES     Patient Active Problem List   Diagnosis Date Noted   Nondisplaced fracture of greater tuberosity of right humerus, initial encounter for closed fracture 02/07/2023   Major depressive disorder, recurrent episode, moderate with anxious distress (HCC) 03/24/2020   Nonspecific abnormal electrocardiogram (ECG) (EKG) 02/26/2019   Morbid obesity (HCC) 02/26/2019   Chest discomfort 02/26/2019   Erroneous encounter - disregard 01/25/2019   GERD (gastroesophageal reflux disease) 04/20/2016   Cough 04/20/2016   IFG (impaired fasting glucose) 12/30/2013   Unspecified vitamin D deficiency 12/30/2013   Back muscle spasm 12/15/2013   Anxiety 04/10/2013    ONSET DATE: 02/05/2023  REFERRING DIAG: R Proximal humerus fracture  THERAPY DIAG:  No diagnosis found.  Rationale for Evaluation and Treatment: Rehabilitation  SUBJECTIVE:   SUBJECTIVE STATEMENT: Eager to be able to raise arm overhead Pt accompanied by: self  PERTINENT HISTORY: No significant PMH   PRECAUTIONS: Other: No strengthening x 6 weeks - AAROM/PROM allowed at eval - DOI 02/05/23  RED FLAGS: None   WEIGHT BEARING RESTRICTIONS: No  PAIN:  Are you having pain? No  FALLS: Has patient fallen in last 6 months? Yes. Number of falls 1  LIVING ENVIRONMENT: Lives with: lives with their family Lives in: House/apartment Stairs: Yes: Internal: 10+ steps; on right going up and External: 4 steps; bilateral but cannot reach both Has following equipment at home: None  PLOF: Independent with basic ADLs  PATIENT GOALS:  Get back to raising R arm up - using Right UE (Dominant)  NEXT MD VISIT: TBD- patient cannot recall  OBJECTIVE:   HAND DOMINANCE: Right  ADLs: Overall ADLs: Independent Transfers/ambulation related to ADLs: walks independently Eating: Independent Grooming: Independent UB Dressing: Independent LB Dressing: Independent Toileting: Independent Bathing: Independent Tub Shower transfers: Independent Equipment: none   UPPER EXTREMITY ROM:     Active Assisted ROM Right eval Left eval  Shoulder flexion 70   Shoulder abduction 55   Shoulder adduction    Shoulder extension 30   Shoulder internal rotation [facility]   Shoulder external rotation Neutral - humeral dependent position   Elbow flexion WFL   Elbow extension -15   Wrist flexion WFL   Wrist extension WFL   Wrist ulnar deviation    Wrist radial deviation    Wrist pronation WFL   Wrist supination WFL   (Blank rows = not tested)     HAND FUNCTION: Grip strength: Right: 43.2 lbs; Left: 47.2 lbs   SENSATION: WFL   COGNITION: Overall cognitive status: Within functional limits for tasks assessed      TODAY'S TREATMENT:  DATE: 03/05/23 Patient educated in simple active assisted shoulder exercises at tabletop.  Patient without pain with AAROM, however attempts at AROM painful.  Patient with tension in biceps as well - worked through stretching discomfort at biceps.  Discussed potential goals for OT - Reduce pain, improve range of motion, and finally strengthening once cleared.  Reviewed passive, AAROM versus AROM.     PATIENT EDUCATION: Education details: AAROM Person educated: Patient Education method: Programmer, multimedia, Facilities manager, Actor cues, Verbal cues, and Handouts Education comprehension: verbalized understanding, returned demonstration, and needs further education  HOME EXERCISE  PROGRAM: See patient instructions  GOALS: Goals reviewed with patient? Yes  SHORT TERM GOALS: Target date: 04/05/23  Patient will complete HEP for right shoulder range of motion with intermittent cueing Baseline: No HEP Goal status: INITIAL  2.  Patient will demonstrate 90* of AAROM flexion in right shoulder without increase in pain Baseline: 70* flex Goal status: INITIAL  3.  Patient will demonstrate 38* AAROM Abduction R shoulder  Baseline: 55* Goal status: INITIAL     LONG TERM GOALS: Target date: 05/04/23  Patient will complete HEP designed to improve range of motion and strength in RUE Baseline: No HEP Goal status: INITIAL  2.  Patient will demonstrate sufficient AROM/ strength/ endurance to use right hand to comb hair by bringing hand to head Baseline: Unable to comb with R hand, or brings head to hand Goal status: INITIAL  3.  Patient will demonstrate sufficient AROM right shoulder flexion to reach into overhead shelf to obtain/replace a lightweight object - 2 lbs or less- without pain.   Baseline: Unable to reach overhead Goal status: INITIAL  4.  Patient will demonstrate improved right grip strength to 50 lbs to increase ability to open jars/ bottles Baseline: 43lbs right - dominant, 47 lbs left Goal status: INITIAL    ASSESSMENT:  CLINICAL IMPRESSION: Patient is a 42 y.o. female who was seen today for occupational therapy evaluation for R Proximal humerus fracture, following a fall at home on 02/05/23.    PERFORMANCE DEFICITS: in functional skills including IADLs, coordination, edema, ROM, strength, pain, fascial restrictions, Gross motor control, and UE functional use. IMPAIRMENTS: are limiting patient from ADLs, IADLs, work, and leisure.   COMORBIDITIES: has no other co-morbidities that affects occupational performance. Patient will benefit from skilled OT to address above impairments and improve overall function.  MODIFICATION OR ASSISTANCE TO COMPLETE  EVALUATION: No modification of tasks or assist necessary to complete an evaluation.  OT OCCUPATIONAL PROFILE AND HISTORY: Problem focused assessment: Including review of records relating to presenting problem.  CLINICAL DECISION MAKING: LOW - limited treatment options, no task modification necessary  REHAB POTENTIAL: Good  EVALUATION COMPLEXITY: Low      PLAN:  OT FREQUENCY: 2x/week  OT DURATION: 8 weeks  PLANNED INTERVENTIONS: self care/ADL training, therapeutic exercise, therapeutic activity, manual therapy, passive range of motion, aquatic therapy, ultrasound, moist heat, cryotherapy, and patient/family education  RECOMMENDED OTHER SERVICES: NA  CONSULTED AND AGREED WITH PLAN OF CARE: Patient  PLAN FOR NEXT SESSION: Review tabletop shoulder slides, continue to progress PROM/AAROM R shoulder - keep adding to HEP.   Delana Meyer, OT 04/01/2023, 2:53 PM

## 2023-04-01 NOTE — Telephone Encounter (Signed)
Left message on pt phone that she has no showed 2 visits. She was reminded of her next OT visit on 9/11. OT encouraged pt to call should she not be able to make this visit as a no show will result in a d/c.

## 2023-04-03 ENCOUNTER — Encounter: Payer: Medicaid Other | Admitting: Occupational Therapy

## 2023-04-06 ENCOUNTER — Ambulatory Visit: Payer: Self-pay

## 2023-04-08 ENCOUNTER — Encounter: Payer: Medicaid Other | Admitting: Occupational Therapy

## 2023-04-11 ENCOUNTER — Encounter: Payer: Medicaid Other | Admitting: Occupational Therapy

## 2023-04-11 ENCOUNTER — Encounter: Payer: Self-pay | Admitting: Physician Assistant

## 2023-04-11 ENCOUNTER — Ambulatory Visit: Payer: Medicaid Other | Attending: Family Medicine | Admitting: Physician Assistant

## 2023-04-11 VITALS — BP 129/82 | HR 102 | Wt 298.2 lb

## 2023-04-11 DIAGNOSIS — Z09 Encounter for follow-up examination after completed treatment for conditions other than malignant neoplasm: Secondary | ICD-10-CM | POA: Diagnosis not present

## 2023-04-11 DIAGNOSIS — R739 Hyperglycemia, unspecified: Secondary | ICD-10-CM | POA: Diagnosis not present

## 2023-04-11 DIAGNOSIS — L304 Erythema intertrigo: Secondary | ICD-10-CM | POA: Diagnosis not present

## 2023-04-11 DIAGNOSIS — F411 Generalized anxiety disorder: Secondary | ICD-10-CM | POA: Diagnosis not present

## 2023-04-11 DIAGNOSIS — R059 Cough, unspecified: Secondary | ICD-10-CM

## 2023-04-11 LAB — GLUCOSE, POCT (MANUAL RESULT ENTRY): POC Glucose: 116 mg/dl — AB (ref 70–99)

## 2023-04-11 MED ORDER — CLOTRIMAZOLE-BETAMETHASONE 1-0.05 % EX CREA
1.0000 | TOPICAL_CREAM | Freq: Every day | CUTANEOUS | 2 refills | Status: DC
Start: 2023-04-11 — End: 2023-05-19

## 2023-04-11 MED ORDER — CETIRIZINE HCL 10 MG PO TABS
10.0000 mg | ORAL_TABLET | Freq: Every day | ORAL | 1 refills | Status: DC
Start: 2023-04-11 — End: 2023-05-19

## 2023-04-11 MED ORDER — SERTRALINE HCL 50 MG PO TABS
ORAL_TABLET | ORAL | 0 refills | Status: AC
Start: 2023-04-11 — End: ?

## 2023-04-11 MED ORDER — KETOCONAZOLE 200 MG PO TABS
200.0000 mg | ORAL_TABLET | Freq: Every day | ORAL | 0 refills | Status: DC
Start: 2023-04-11 — End: 2023-04-11

## 2023-04-11 MED ORDER — KETOCONAZOLE 200 MG PO TABS
200.0000 mg | ORAL_TABLET | Freq: Every day | ORAL | 0 refills | Status: DC
Start: 2023-04-11 — End: 2023-04-12
  Filled 2023-04-11: qty 14, 14d supply, fill #0

## 2023-04-11 NOTE — Patient Instructions (Signed)
Intertrigo Intertrigo is skin irritation (inflammation) that happens in warm, moist areas of the body. The irritation can cause a rash and make skin raw and itchy. The rash is usually pink or red. It happens mostly between folds of skin or where skin rubs together, such as: In the armpits. Under the breasts. Under the belly. In the groin area. Around the butt area. Between the toes. This condition is not passed from person to person. What are the causes? Heat, moisture, rubbing, and not enough air movement. The condition can be made worse by: Sweat. Bacteria. A fungus, such as yeast. What increases the risk? Moisture in your skin folds. You are more likely to develop this condition if you: Are not able to move around. Live in a warm and moist climate. Are not able to control your pee (urine) or poop (stool). Wear splints, braces, or other medical devices. Are overweight. Have diabetes. What are the signs or symptoms? A pink or red skin rash in a skin fold or near a skin fold. Raw or scaly skin. Itching. A burning feeling. Bleeding. Leaking fluid. A bad smell. How is this treated? Cleaning and drying your skin. Taking an antibiotic medicine or using an antibiotic skin cream for a bacterial infection. Using an antifungal cream on your skin or taking pills for an infection that was caused by a fungus, such as yeast. Using a steroid ointment to stop the itching and irritation. Separating the skin fold with a clean cotton cloth to absorb moisture and allow air to flow into the area. Follow these instructions at home: Keep the affected area clean and dry. Do not scratch your skin. Stay cool as much as you can. Use an air conditioner or a fan, if you have one. Apply over-the-counter and prescription medicines only as told by your doctor. If you were prescribed antibiotics, use them as told by your doctor. Do not stop using the antibiotic even if you start to feel better. Keep all  follow-up visits. Your doctor may need to check your skin to make sure that the treatment is working. How is this prevented? Shower and dry yourself well after being active. Use a hair dryer on a cool setting to dry between skin folds. Do not wear tight clothes. Wear clothes that: Are loose. Take moisture away from your body. Are made of cotton. Wear a bra that gives good support, if needed. Protect the skin in your groin and butt area as told by your doctor. To do this: Follow a regular cleaning routine. Use creams, powders, or ointments that protect your skin. Change protection pads often. Stay at a healthy weight. Take care of your feet. This is very important if you have diabetes. You should: Wear shoes that fit well. Keep your feet dry. Wear clean cotton or wool socks. Keep your blood sugar under control if you have diabetes. Contact a doctor if: Your symptoms do not get better with treatment. Your symptoms get worse or they spread. You notice more redness and warmth. You have a fever. This information is not intended to replace advice given to you by your health care provider. Make sure you discuss any questions you have with your health care provider. Document Revised: 11/30/2021 Document Reviewed: 11/30/2021 Elsevier Patient Education  2024 [employer].

## 2023-04-11 NOTE — Progress Notes (Signed)
Patient ID: Breanna Thomas, female   DOB: Dec 14, 1980, 42 y.o.   MRN: [id number]     Breanna Thomas, is a 42 y.o. female  YWV:[id number]  WNI:[id number]  DOB - 09-30-1980  Chief Complaint  Patient presents with   Hospitalization Follow-up   Medication Refill       Subjective:   Breanna Thomas is a 42 y.o. female here today after being seen at [facility] about 10 days ago for rash beneath breasts that is also beneath pannus.  The nyastatin powder she was prescribed is not really helping.  Very itchy and stings at times.  She has had this before but never so severe. last A1C=5.2.  denies polyuria or polydipsia.  She is not fasting today and blood sugar is 116.    From UC note: UC on 9/8 Reviewed exam and symptoms with patient. No red flags. Discussed intertrigo. Will do nystatin powder. Advised to use a barrier cream and/or barrier gauze to help keep area dry. PCP follow-up 2 days for recheck. ER precautions reviewed and patient verbalized understanding.  No problems updated.  ALLERGIES: No Known Allergies  PAST MEDICAL HISTORY: Past Medical History:  Diagnosis Date   Anemia    Anxiety    Anxiety    Panic attacks    Vertigo     MEDICATIONS AT HOME: Prior to Admission medications   Medication Sig Start Date End Date Taking? Authorizing Provider  clotrimazole-betamethasone (LOTRISONE) cream Apply 1 Application topically daily. For 5 weeks then prn 04/11/23  Yes Laiylah Roettger M, PA-C  fluticasone (FLONASE) 50 MCG/ACT nasal spray SHAKE LIQUID AND USE 2 SPRAYS IN EACH NOSTRIL DAILY 05/24/22  Yes Hoy Register, MD  hydrOXYzine (ATARAX) 25 MG tablet Take 1 tablet (25 mg total) by mouth 3 (three) times daily as needed. 09/06/22  Yes Hoy Register, MD  ibuprofen (ADVIL) 800 MG tablet Take 1 tablet (800 mg total) by mouth every 8 (eight) hours as needed (shoulder pain). 03/26/23  Yes Cristie Hem, PA-C  nystatin (MYCOSTATIN/NYSTOP) powder Apply 1 Application topically 3 (three) times  daily. 03/31/23  Yes Radford Pax, NP  omeprazole (PRILOSEC) 20 MG capsule TAKE 1 CAPSULE(20 MG) BY MOUTH DAILY 12/18/22  Yes Hoy Register, MD  cetirizine (ZYRTEC) 10 MG tablet Take 1 tablet (10 mg total) by mouth daily. 04/11/23   Anders Simmonds, PA-C  ipratropium (ATROVENT) 0.06 % nasal spray Place 2 sprays into both nostrils 4 (four) times daily. As needed for nasal congestion, runny nose Patient not taking: Reported on 04/11/2023 07/03/21   Theadora Rama Scales, PA-C  ketoconazole (NIZORAL) 200 MG tablet Take 1 tablet (200 mg total) by mouth daily. 04/11/23   Anders Simmonds, PA-C  lidocaine (LIDODERM) 5 % Place 1 patch onto the skin daily. Remove & Discard patch within 12 hours or as directed by MD Patient not taking: Reported on 04/11/2023 09/06/22   Hoy Register, MD  sertraline (ZOLOFT) 50 MG tablet TAKE 1 TABLET BY MOUTH EVERY DAY 04/11/23   Cynde Menard, Marzella Schlein, PA-C    ROS: Neg HEENT Neg resp Neg cardiac Neg GI Neg GU Neg MS Neg psych Neg neuro  Objective:   Vitals:   04/11/23 1626  BP: 129/82  Pulse: (!) 102  SpO2: 91%  Weight: 298 lb 3.2 oz (135.3 kg)   Exam General appearance : Awake, alert, not in any distress. Speech Clear. Not toxic looking HEENT: Atraumatic and Normocephalic Neck: Supple, no JVD. No cervical lymphadenopathy.  Chest: Good air entry bilaterally,  CTAB.  No rales/rhonchi/wheezing CVS: S1 S2 regular, no murmurs.  Beneath B breasts and pannus there is typical fungal rash with satellite lesions without sign of secondary infection.  Extremities: B/L Lower Ext shows no edema, both legs are warm to touch Neurology: Awake alert, and oriented X 3, CN II-XII intact, Non focal Skin: No Rash  Data Review Lab Results  Component Value Date   HGBA1C 5.2 05/01/2021    Assessment & Plan   1. Elevated blood sugar <120  - Glucose (CBG)  2. GAD (generalized anxiety disorder) Stable on meds - sertraline (ZOLOFT) 50 MG tablet; TAKE 1 TABLET BY MOUTH  EVERY DAY  Dispense: 90 tablet; Refill: 0  3. Cough, unspecified type Mostly resolved but zyrtec will help with itching of rash - cetirizine (ZYRTEC) 10 MG tablet; Take 1 tablet (10 mg total) by mouth daily.  Dispense: 30 tablet; Refill: 1  4. Intertrigo Last LFT normal - cetirizine (ZYRTEC) 10 MG tablet; Take 1 tablet (10 mg total) by mouth daily.  Dispense: 30 tablet; Refill: 1 - clotrimazole-betamethasone (LOTRISONE) cream; Apply 1 Application topically daily. For 5 weeks then prn  Dispense: 45 g; Refill: 2 - ketoconazole (NIZORAL) 200 MG tablet; Take 1 tablet (200 mg total) by mouth daily.  Dispense: 14 tablet; Refill: 0  5. Encounter for examination following treatment at hospital     Return in about 3 months (around 07/11/2023), or if symptoms worsen or fail to improve, for PCP for chronic conditions([place]).  The patient was given clear instructions to go to ER or return to medical center if symptoms don't improve, worsen or new problems develop. The patient verbalized understanding. The patient was told to call to get lab results if they haven't heard anything in the next week.      Georgian Co, PA-C [facility] [place], [place] [phone]   04/11/2023, 5:23 PM

## 2023-04-12 ENCOUNTER — Other Ambulatory Visit: Payer: Self-pay | Admitting: Physician Assistant

## 2023-04-12 ENCOUNTER — Other Ambulatory Visit: Payer: Self-pay

## 2023-04-12 DIAGNOSIS — L304 Erythema intertrigo: Secondary | ICD-10-CM

## 2023-04-12 MED ORDER — FLUCONAZOLE 200 MG PO TABS
200.0000 mg | ORAL_TABLET | Freq: Every day | ORAL | 0 refills | Status: DC
Start: 2023-04-12 — End: 2023-05-19
  Filled 2023-04-12: qty 7, 7d supply, fill #0

## 2023-04-15 ENCOUNTER — Encounter: Payer: Medicaid Other | Admitting: Occupational Therapy

## 2023-04-18 ENCOUNTER — Encounter: Payer: Medicaid Other | Admitting: Occupational Therapy

## 2023-04-19 ENCOUNTER — Other Ambulatory Visit: Payer: Self-pay

## 2023-04-22 ENCOUNTER — Encounter: Payer: Medicaid Other | Admitting: Occupational Therapy

## 2023-04-23 ENCOUNTER — Other Ambulatory Visit: Payer: Self-pay

## 2023-04-25 ENCOUNTER — Encounter: Payer: Medicaid Other | Admitting: Occupational Therapy

## 2023-04-29 ENCOUNTER — Encounter: Payer: Medicaid Other | Admitting: Occupational Therapy

## 2023-05-02 ENCOUNTER — Encounter: Payer: Medicaid Other | Admitting: Occupational Therapy

## 2023-05-06 ENCOUNTER — Encounter: Payer: Medicaid Other | Admitting: Occupational Therapy

## 2023-05-09 ENCOUNTER — Encounter: Payer: Medicaid Other | Admitting: Occupational Therapy

## 2023-05-13 ENCOUNTER — Encounter: Payer: Medicaid Other | Admitting: Occupational Therapy

## 2023-05-16 ENCOUNTER — Encounter: Payer: Medicaid Other | Admitting: Occupational Therapy

## 2023-05-19 ENCOUNTER — Ambulatory Visit
Admission: RE | Admit: 2023-05-19 | Discharge: 2023-05-19 | Disposition: A | Discharge status: Home / Self Care | Payer: Medicaid Other | Source: Ambulatory Visit | Attending: Internal Medicine | Admitting: Internal Medicine

## 2023-05-19 VITALS — BP 118/82 | HR 97 | Temp 99.6°F | Resp 18

## 2023-05-19 DIAGNOSIS — U071 COVID-19: Secondary | ICD-10-CM | POA: Insufficient documentation

## 2023-05-19 DIAGNOSIS — B9789 Other viral agents as the cause of diseases classified elsewhere: Secondary | ICD-10-CM | POA: Insufficient documentation

## 2023-05-19 DIAGNOSIS — J988 Other specified respiratory disorders: Secondary | ICD-10-CM | POA: Insufficient documentation

## 2023-05-19 DIAGNOSIS — L304 Erythema intertrigo: Secondary | ICD-10-CM | POA: Insufficient documentation

## 2023-05-19 DIAGNOSIS — R059 Cough, unspecified: Secondary | ICD-10-CM | POA: Insufficient documentation

## 2023-05-19 LAB — POCT INFLUENZA A/B
Influenza A, POC: NEGATIVE
Influenza B, POC: NEGATIVE

## 2023-05-19 MED ORDER — PROMETHAZINE-DM 6.25-15 MG/5ML PO SYRP: 5.0000 mL | ORAL_SOLUTION | Freq: Three times a day (TID) | ORAL | 0 refills | Status: DC | PRN

## 2023-05-19 MED ORDER — CETIRIZINE HCL 10 MG PO TABS
10.0000 mg | ORAL_TABLET | Freq: Every day | ORAL | 0 refills | Status: DC
Start: 2023-05-19 — End: 2023-08-05

## 2023-05-19 MED ORDER — PSEUDOEPHEDRINE HCL 60 MG PO TABS: 60.0000 mg | ORAL_TABLET | Freq: Three times a day (TID) | ORAL | 0 refills | Status: DC | PRN

## 2023-05-19 NOTE — ED Triage Notes (Signed)
Pt reports fever, sore throat, cough, headache x 2 days. Ibuprofen and Robitussin gives some relief.

## 2023-05-19 NOTE — Discharge Instructions (Addendum)
We will notify you of your test results as they arrive and may take between about 24 hours.  I encourage you to sign up for MyChart if you have not already done so as this can be the easiest way for [place] to communicate results to you online or through a phone app.  Generally, we only contact you if it is a positive test result.  In the meantime, if you develop worsening symptoms including fever, chest pain, shortness of breath despite our current treatment plan then please report to the emergency room as this may be a sign of worsening status from possible viral infection.  Otherwise, we will manage this as a viral syndrome. For sore throat or cough try using a honey-based tea. Use 3 teaspoons of honey with juice squeezed from half lemon. Place shaved pieces of ginger into 1/2-1 cup of water and warm over stove top. Then mix the ingredients and repeat every 4 hours as needed. Please take Tylenol 650mg  every 6 hours for aches and pains, fevers. Hydrate very well with at least 2 liters of water. Eat light meals such as soups to replenish electrolytes and soft fruits, veggies. Start an antihistamine like Zyrtec (10mg  daily) for postnasal drainage, sinus congestion.  You can take this together with pseudoephedrine (Sudafed) at a dose of 60 mg 2-3 times a day as needed for the same kind of congestion.  Use the cough medications as needed.

## 2023-05-19 NOTE — ED Provider Notes (Signed)
[name]  Note:  This document was prepared using Conservation officer, historic buildings and may include unintentional dictation errors.  MRN: [id number] DOB: July 31, 1980  Subjective:   Breanna Thomas is a 42 y.o. female presenting for 2-day history of malaise, fatigue, fever, throat pain, productive cough, sinus headaches.  Has been using ibuprofen and Robitussin with some relief.  No chest pain, shortness of breath or wheezing.  No history of asthma.  No smoking of any kind including cigarettes, cigars, vaping, marijuana use.  Patient is requesting a flu and COVID test for her work.  No current facility-administered medications for this encounter.  Current Outpatient Medications:    Pseudoephedrine-DM-GG (ROBITUSSIN CF PO), Take by mouth., Disp: , Rfl:    cetirizine (ZYRTEC) 10 MG tablet, Take 1 tablet (10 mg total) by mouth daily., Disp: 30 tablet, Rfl: 1   hydrOXYzine (ATARAX) 25 MG tablet, Take 1 tablet (25 mg total) by mouth 3 (three) times daily as needed., Disp: 90 tablet, Rfl: 1   ibuprofen (ADVIL) 800 MG tablet, Take 1 tablet (800 mg total) by mouth every 8 (eight) hours as needed (shoulder pain)., Disp: 21 tablet, Rfl: 0   omeprazole (PRILOSEC) 20 MG capsule, TAKE 1 CAPSULE(20 MG) BY MOUTH DAILY, Disp: 90 capsule, Rfl: 2   sertraline (ZOLOFT) 50 MG tablet, TAKE 1 TABLET BY MOUTH EVERY DAY, Disp: 90 tablet, Rfl: 0   No Known Allergies  Past Medical History:  Diagnosis Date   Anemia    Anxiety    Anxiety    Panic attacks    Vertigo      Past Surgical History:  Procedure Laterality Date   NO PAST SURGERIES      Family History  Adopted: Yes  Problem Relation Age of Onset   Diabetes Mother    Hypertension Mother    Heart disease Father    Seizures Father    Seizures Other    Heart failure Other    Diabetes Sister     Social History   Tobacco Use   Smoking status: Never   Smokeless tobacco: Never  Vaping Use   Vaping status: Never  Used  Substance Use Topics   Alcohol use: Not Currently    Alcohol/week: 1.0 standard drink of alcohol    Types: 1 Glasses of wine per week    Comment: social   Drug use: No    ROS   Objective:   Vitals: BP 118/82 (BP Location: Right Arm)   Pulse 97   Temp 99.6 F (37.6 C) (Oral)   Resp 18   LMP  (Within Weeks) Comment: 2 weeks  SpO2 95%   Physical Exam Constitutional:      General: She is not in acute distress.    Appearance: Normal appearance. She is well-developed and normal weight. She is not ill-appearing, toxic-appearing or diaphoretic.  HENT:     Head: Normocephalic and atraumatic.     Right Ear: Tympanic membrane, ear canal and external ear normal. No drainage or tenderness. No middle ear effusion. There is no impacted cerumen. Tympanic membrane is not erythematous or bulging.     Left Ear: Tympanic membrane, ear canal and external ear normal. No drainage or tenderness.  No middle ear effusion. There is no impacted cerumen. Tympanic membrane is not erythematous or bulging.     Nose: Nose normal. No congestion or rhinorrhea.     Mouth/Throat:     Mouth: Mucous membranes are moist. No oral lesions.  Pharynx: No pharyngeal swelling, oropharyngeal exudate, posterior oropharyngeal erythema or uvula swelling.     Tonsils: No tonsillar exudate or tonsillar abscesses.  Eyes:     General: No scleral icterus.       Right eye: No discharge.        Left eye: No discharge.     Extraocular Movements: Extraocular movements intact.     Right eye: Normal extraocular motion.     Left eye: Normal extraocular motion.     Conjunctiva/sclera: Conjunctivae normal.  Cardiovascular:     Rate and Rhythm: Normal rate and regular rhythm.     Heart sounds: Normal heart sounds. No murmur heard.    No friction rub. No gallop.  Pulmonary:     Effort: Pulmonary effort is normal. No respiratory distress.     Breath sounds: No stridor. No wheezing, rhonchi or rales.  Chest:     Chest  wall: No tenderness.  Musculoskeletal:     Cervical back: Normal range of motion and neck supple.  Lymphadenopathy:     Cervical: No cervical adenopathy.  Skin:    General: Skin is warm and dry.  Neurological:     General: No focal deficit present.     Mental Status: She is alert and oriented to person, place, and time.  Psychiatric:        Mood and Affect: Mood normal.        Behavior: Behavior normal.    Results for orders placed or performed during the hospital encounter of 05/19/23 (from the past 24 hour(s))  POCT Influenza A/B     Status: None   Collection Time: 05/19/23 11:16 AM  Result Value Ref Range   Influenza A, POC Negative Negative   Influenza B, POC Negative Negative     Assessment and Plan :   PDMP not reviewed this encounter.  1. Viral respiratory infection    Deferred imaging given clear cardiopulmonary exam, hemodynamically stable vital signs.  Will manage for viral illness such as viral URI, viral syndrome, viral rhinitis, COVID-19. Recommended supportive care. Offered scripts for symptomatic relief. Testing is pending. Counseled patient on potential for adverse effects with medications prescribed/recommended today, ER and return-to-clinic precautions discussed, patient verbalized understanding.     Wallis Bamberg, PA-C 05/19/23 1152

## 2023-05-20 ENCOUNTER — Telehealth: Payer: Self-pay

## 2023-05-20 ENCOUNTER — Encounter: Payer: Medicaid Other | Admitting: Occupational Therapy

## 2023-05-20 ENCOUNTER — Telehealth: Payer: Self-pay | Admitting: Nurse Practitioner

## 2023-05-20 DIAGNOSIS — U071 COVID-19: Secondary | ICD-10-CM

## 2023-05-20 LAB — SARS CORONAVIRUS 2 (TAT 6-24 HRS): SARS Coronavirus 2: POSITIVE — AB

## 2023-05-20 MED ORDER — PAXLOVID (300/100) 20 X 150 MG & 10 X 100MG PO TBPK
3.0000 | ORAL_TABLET | Freq: Two times a day (BID) | ORAL | 0 refills | Status: AC
Start: 2023-05-20 — End: 2023-05-25

## 2023-05-20 MED ORDER — PAXLOVID (300/100) 20 X 150 MG & 10 X 100MG PO TBPK
3.0000 | ORAL_TABLET | Freq: Two times a day (BID) | ORAL | 0 refills | Status: DC
Start: 2023-05-20 — End: 2023-05-20

## 2023-05-20 NOTE — Telephone Encounter (Signed)
Pt called in requesting to have the COVID antiviral rx sent to pharmacy. She confirmed the pharmacy and we went over her current medications. Medical Provider sent in Paxlovid and requested she hold off on taking hydroxyzine for 3 days after completion. Pt expressed understanding and asked for a work note.

## 2023-05-20 NOTE — Telephone Encounter (Signed)
Patient seen in urgent care on 10/27 for 2 days of respiratory symptoms.  COVID PCR positive.  Patient called in requesting Paxlovid treatment.  Medication list reviewed by nursing staff and confirmed all active medications.  Patient instructed to hold hydroxyzine while taking Paxlovid as well as for 3 days after completion.  GFR reviewed from February 2024 labs showing normal GFR.  Rx sent to pharmacy.  Patient to follow-up with PCP if symptoms do not improve.  Nursing staff to reinforce ER precautions.

## 2023-05-23 ENCOUNTER — Encounter: Payer: Medicaid Other | Admitting: Occupational Therapy

## 2023-05-27 ENCOUNTER — Ambulatory Visit
Admission: RE | Admit: 2023-05-27 | Discharge: 2023-05-27 | Disposition: A | Discharge status: Home / Self Care | Payer: Medicaid Other | Source: Ambulatory Visit | Attending: Internal Medicine | Admitting: Internal Medicine

## 2023-05-27 VITALS — BP 129/85 | HR 85 | Temp 98.1°F | Resp 14

## 2023-05-27 DIAGNOSIS — L299 Pruritus, unspecified: Secondary | ICD-10-CM

## 2023-05-27 DIAGNOSIS — L500 Allergic urticaria: Secondary | ICD-10-CM | POA: Diagnosis not present

## 2023-05-27 MED ORDER — HYDROXYZINE HCL 25 MG PO TABS: 12.5000 mg | ORAL_TABLET | Freq: Three times a day (TID) | ORAL | 0 refills | Status: AC | PRN

## 2023-05-27 MED ORDER — PREDNISONE 20 MG PO TABS: 20.0000 mg | ORAL_TABLET | Freq: Every day | ORAL | 0 refills | Status: DC

## 2023-05-27 NOTE — ED Provider Notes (Signed)
[name]  Note:  This document was prepared using Conservation officer, historic buildings and may include unintentional dictation errors.  MRN: [id number] DOB: 01-01-1981  Subjective:   Breanna Thomas is a 42 y.o. female presenting for several week history of persistent intermittent urticarial lesions, itchiness.  Symptoms are worse at night.  Has used Benadryl, hydroxyzine.  Denies eating any new foods, starting new medications, exposure to poisonous plants, new hygiene products, new cleaning products or detergents.  No difficulty with facial swelling, oral swelling, difficulty breathing.  No current facility-administered medications for this encounter.  Current Outpatient Medications:    cetirizine (ZYRTEC) 10 MG tablet, Take 1 tablet (10 mg total) by mouth daily., Disp: 30 tablet, Rfl: 0   hydrOXYzine (ATARAX) 25 MG tablet, Take 1 tablet (25 mg total) by mouth 3 (three) times daily as needed., Disp: 90 tablet, Rfl: 1   ibuprofen (ADVIL) 800 MG tablet, Take 1 tablet (800 mg total) by mouth every 8 (eight) hours as needed (shoulder pain)., Disp: 21 tablet, Rfl: 0   omeprazole (PRILOSEC) 20 MG capsule, TAKE 1 CAPSULE(20 MG) BY MOUTH DAILY, Disp: 90 capsule, Rfl: 2   promethazine-dextromethorphan (PROMETHAZINE-DM) 6.25-15 MG/5ML syrup, Take 5 mLs by mouth 3 (three) times daily as needed for cough., Disp: 200 mL, Rfl: 0   pseudoephedrine (SUDAFED) 60 MG tablet, Take 1 tablet (60 mg total) by mouth every 8 (eight) hours as needed for congestion., Disp: 30 tablet, Rfl: 0   Pseudoephedrine-DM-GG (ROBITUSSIN CF PO), Take by mouth., Disp: , Rfl:    sertraline (ZOLOFT) 50 MG tablet, TAKE 1 TABLET BY MOUTH EVERY DAY, Disp: 90 tablet, Rfl: 0   No Known Allergies  Past Medical History:  Diagnosis Date   Anemia    Anxiety    Anxiety    Panic attacks    Vertigo      Past Surgical History:  Procedure Laterality Date   NO PAST SURGERIES      Family History  Adopted:  Yes  Problem Relation Age of Onset   Diabetes Mother    Hypertension Mother    Heart disease Father    Seizures Father    Seizures Other    Heart failure Other    Diabetes Sister     Social History   Tobacco Use   Smoking status: Never   Smokeless tobacco: Never  Vaping Use   Vaping status: Never Used  Substance Use Topics   Alcohol use: Not Currently    Alcohol/week: 1.0 standard drink of alcohol    Types: 1 Glasses of wine per week    Comment: social   Drug use: No    ROS   Objective:   Vitals: BP 129/85 (BP Location: Right Arm)   Pulse 85   Temp 98.1 F (36.7 C) (Oral)   Resp 14   SpO2 95%   Physical Exam Constitutional:      General: She is not in acute distress.    Appearance: Normal appearance. She is well-developed. She is not ill-appearing, toxic-appearing or diaphoretic.  HENT:     Head: Normocephalic and atraumatic.     Nose: Nose normal.     Mouth/Throat:     Mouth: Mucous membranes are moist.  Eyes:     General: No scleral icterus.       Right eye: No discharge.        Left eye: No discharge.     Extraocular Movements: Extraocular movements intact.  Cardiovascular:  Rate and Rhythm: Normal rate.  Pulmonary:     Effort: Pulmonary effort is normal.  Skin:    General: Skin is warm and dry.     Findings: Rash (demonstrates pictures showing her intermittent urticarial lesions over the neck and upper back) present.  Neurological:     General: No focal deficit present.     Mental Status: She is alert and oriented to person, place, and time.  Psychiatric:        Mood and Affect: Mood normal.        Behavior: Behavior normal.     Assessment and Plan :   PDMP not reviewed this encounter.  1. Allergic urticaria   2. Itching    Patient had an urticarial lesions at the left lateral neck.  As this has been going on for several weeks now and she has continued to use antihistamines and eliminate new exposures I recommended a referral to an  allergy center.  In the meantime, can use prednisone, hydroxyzine or Zyrtec. Counseled patient on potential for adverse effects with medications prescribed/recommended today, ER and return-to-clinic precautions discussed, patient verbalized understanding.    Wallis Bamberg, [place] 05/27/23 1744

## 2023-05-27 NOTE — ED Triage Notes (Signed)
Patient c/o itchiness at night time x several weeks. She states she noticed whelps after she scratches.

## 2023-06-21 ENCOUNTER — Ambulatory Visit
Admission: RE | Admit: 2023-06-21 | Discharge: 2023-06-21 | Disposition: A | Discharge status: Home / Self Care | Payer: Medicaid Other | Source: Ambulatory Visit | Attending: Family Medicine | Admitting: Family Medicine

## 2023-06-21 VITALS — BP 161/105 | HR 84 | Temp 98.1°F | Resp 16

## 2023-06-21 DIAGNOSIS — N3090 Cystitis, unspecified without hematuria: Secondary | ICD-10-CM | POA: Insufficient documentation

## 2023-06-21 DIAGNOSIS — M25511 Pain in right shoulder: Secondary | ICD-10-CM | POA: Insufficient documentation

## 2023-06-21 DIAGNOSIS — S40011A Contusion of right shoulder, initial encounter: Secondary | ICD-10-CM | POA: Diagnosis not present

## 2023-06-21 DIAGNOSIS — N926 Irregular menstruation, unspecified: Secondary | ICD-10-CM | POA: Diagnosis not present

## 2023-06-21 DIAGNOSIS — J069 Acute upper respiratory infection, unspecified: Secondary | ICD-10-CM | POA: Diagnosis not present

## 2023-06-21 LAB — POCT URINALYSIS DIP (MANUAL ENTRY)
Bilirubin, UA: NEGATIVE
Glucose, UA: NEGATIVE mg/dL
Ketones, POC UA: NEGATIVE mg/dL
Nitrite, UA: NEGATIVE
Protein Ur, POC: NEGATIVE mg/dL
Spec Grav, UA: 1.01 (ref 1.010–1.025)
Urobilinogen, UA: 0.2 U/dL
pH, UA: 6 (ref 5.0–8.0)

## 2023-06-21 LAB — POCT URINE PREGNANCY: Preg Test, Ur: NEGATIVE

## 2023-06-21 MED ORDER — IBUPROFEN 800 MG PO TABS: 800.0000 mg | ORAL_TABLET | Freq: Three times a day (TID) | ORAL | 0 refills | Status: DC | PRN

## 2023-06-21 MED ORDER — NITROFURANTOIN MONOHYD MACRO 100 MG PO CAPS: 100.0000 mg | ORAL_CAPSULE | Freq: Two times a day (BID) | ORAL | 0 refills | Status: DC

## 2023-06-21 NOTE — ED Provider Notes (Signed)
[facility]    CSN: [id number] Arrival date & time: 06/21/23  1843      History   Chief Complaint Chief Complaint  Patient presents with   Sore Throat    Hoarse.. runny nose..coughing up green mucus/ arm muscle pain - Entered by patient    HPI Breanna Thomas is a 42 y.o. female.    Sore Throat  Here for sore throat and hoarseness and cough and rhinorrhea.  Symptoms began on November 26.  Then the sore throat improved and she had mainly some hoarseness and then the rhinorrhea also has improved.  She is still coughing some.  No nausea or vomiting or diarrhea.  No fever or chills.  Also she has had some pain when raising her right arm.  Early September she had fallen and had a small nondisplaced fracture of her glenoid tuberosity.  She did see orthopedics.  She was able to complete all the OT due to her work schedule.  She states she does plan to follow-up with them.  She is out of her ibuprofen that does help when she takes it.  She also has noted some abnormal smell to her urine in the last 24 hours and some frequency.  About 2 weeks ago she also had some abnormal smell. No dysuria  She has also had some vaginal spotting and irregular menstrual bleeding in between her periods.  Last menstrual cycle was November 13, but she has been spotting also in the last couple of days  Past Medical History:  Diagnosis Date   Anemia    Anxiety    Anxiety    Panic attacks    Vertigo     Patient Active Problem List   Diagnosis Date Noted   Nondisplaced fracture of greater tuberosity of right humerus, initial encounter for closed fracture 02/07/2023   Major depressive disorder, recurrent episode, moderate with anxious distress (HCC) 03/24/2020   Nonspecific abnormal electrocardiogram (ECG) (EKG) 02/26/2019   Morbid obesity (HCC) 02/26/2019   Chest discomfort 02/26/2019   Erroneous encounter - disregard 01/25/2019   GERD (gastroesophageal reflux disease) 04/20/2016    Cough 04/20/2016   IFG (impaired fasting glucose) 12/30/2013   Vitamin D deficiency 12/30/2013   Back muscle spasm 12/15/2013   Anxiety 04/10/2013    Past Surgical History:  Procedure Laterality Date   NO PAST SURGERIES      OB History     Gravida  3   Para  3   Term      Preterm      AB      Living         SAB      IAB      Ectopic      Multiple      Live Births               Home Medications    Prior to Admission medications   Medication Sig Start Date End Date Taking? Authorizing Provider  nitrofurantoin, macrocrystal-monohydrate, (MACROBID) 100 MG capsule Take 1 capsule (100 mg total) by mouth 2 (two) times daily. 06/21/23  Yes Zenia Resides, MD  cetirizine (ZYRTEC) 10 MG tablet Take 1 tablet (10 mg total) by mouth daily. 05/19/23   Wallis Bamberg, PA-C  hydrOXYzine (ATARAX) 25 MG tablet Take 0.5-1 tablets (12.5-25 mg total) by mouth every 8 (eight) hours as needed for itching. 05/27/23   Wallis Bamberg, PA-C  ibuprofen (ADVIL) 800 MG tablet Take 1 tablet (800 mg total)  by mouth every 8 (eight) hours as needed (shoulder pain). 06/21/23   Zenia Resides, MD  omeprazole (PRILOSEC) 20 MG capsule TAKE 1 CAPSULE(20 MG) BY MOUTH DAILY 12/18/22   Hoy Register, MD  Pseudoephedrine-DM-GG (ROBITUSSIN CF PO) Take by mouth.    [provider]  sertraline (ZOLOFT) 50 MG tablet TAKE 1 TABLET BY MOUTH EVERY DAY 04/11/23   Anders Simmonds, PA-C    Family History Family History  Adopted: Yes  Problem Relation Age of Onset   Diabetes Mother    Hypertension Mother    Heart disease Father    Seizures Father    Seizures Other    Heart failure Other    Diabetes Sister     Social History Social History   Tobacco Use   Smoking status: Never   Smokeless tobacco: Never  Vaping Use   Vaping status: Never Used  Substance Use Topics   Alcohol use: Not Currently    Alcohol/week: 1.0 standard drink of alcohol    Types: 1 Glasses of wine per week     Comment: social   Drug use: No     Allergies   Patient has no known allergies.   Review of Systems Review of Systems   Physical Exam Triage Vital Signs ED Triage Vitals  Encounter Vitals Group     BP 06/21/23 1853 (!) 161/105     Systolic BP Percentile --      Diastolic BP Percentile --      Pulse Rate 06/21/23 1853 84     Resp 06/21/23 1853 16     Temp 06/21/23 1853 98.1 F (36.7 C)     Temp Source 06/21/23 1853 Oral     SpO2 06/21/23 1853 98 %     Weight --      Height --      Head Circumference --      Peak Flow --      Pain Score 06/21/23 1850 0     Pain Loc --      Pain Education --      Exclude from Growth Chart --    No data found.  Updated Vital Signs BP (!) 161/105 (BP Location: Left Wrist)   Pulse 84   Temp 98.1 F (36.7 C) (Oral)   Resp 16   LMP 06/05/2023 (Approximate)   SpO2 98%   Visual Acuity Right Eye Distance:   Left Eye Distance:   Bilateral Distance:    Right Eye Near:   Left Eye Near:    Bilateral Near:     Physical Exam Vitals reviewed.  Constitutional:      General: She is not in acute distress.    Appearance: She is not toxic-appearing.  HENT:     Nose: Nose normal.     Mouth/Throat:     Mouth: Mucous membranes are moist.     Comments: No erythema or asymmetry.  There is mucus draining Eyes:     Extraocular Movements: Extraocular movements intact.     Conjunctiva/sclera: Conjunctivae normal.     Pupils: Pupils are equal, round, and reactive to light.  Cardiovascular:     Rate and Rhythm: Normal rate and regular rhythm.     Heart sounds: No murmur heard. Pulmonary:     Effort: Pulmonary effort is normal. No respiratory distress.     Breath sounds: No stridor. No wheezing, rhonchi or rales.  Abdominal:     Palpations: Abdomen is soft.     Tenderness: There  is no abdominal tenderness.  Musculoskeletal:     Cervical back: Neck supple.  Lymphadenopathy:     Cervical: No cervical adenopathy.  Skin:    Capillary  Refill: Capillary refill takes less than 2 seconds.     Coloration: Skin is not jaundiced or pale.  Neurological:     General: No focal deficit present.     Mental Status: She is alert and oriented to person, place, and time.  Psychiatric:        Behavior: Behavior normal.      UC Treatments / Results  Labs (all labs ordered are listed, but only abnormal results are displayed) Labs Reviewed  POCT URINALYSIS DIP (MANUAL ENTRY) - Abnormal; Notable for the following components:      Result Value   Color, UA light yellow (*)    Thomas, UA moderate (*)    Leukocytes, UA Trace (*)    All other components within normal limits  URINE CULTURE  POCT URINE PREGNANCY    EKG   Radiology No results found.  Procedures Procedures (including critical care time)  Medications Ordered in UC Medications - No data to display  Initial Impression / Assessment and Plan / UC Course  I have reviewed the triage vital signs and the nursing notes.  Pertinent labs & imaging results that were available during my care of the patient were reviewed by me and considered in my medical decision making (see chart for details).     UPT is negative.  Urinalysis shows trace leukocytes and some RBCs.  Urine culture is sent.  Macrobid is sent and to treat possible cystitis.  Ibuprofen is sent in to treat the shoulder pain. We decided against any viral testing.  She had COVID about 1 month ago and it is highly unlikely that she would have COVID this soon.  Also her work is not requiring testing but is instead actually following the CDC guidelines.  Since she is feeling better she can return to work as scheduled.  She is given contact information for OB/GYN and will also follow-up with her primary care  Final Clinical Impressions(s) / UC Diagnoses   Final diagnoses:  Viral URI  Acute pain of right shoulder  Cystitis  Irregular menses     Discharge Instructions      The pregnancy test was  negative The urinalysis showed some red Thomas cells and white Thomas cells.  This could be a sign of cystitis.  Take ibuprofen 800 mg--1 tab every 8 hours as needed for pain.  Take nitrofurantoin 100 mg--1 capsule 2 times daily for 5 days  Urine culture is sent and staff will notify you if it shows we need to change the antibiotic      ED Prescriptions     Medication Sig Dispense Auth. Provider   ibuprofen (ADVIL) 800 MG tablet Take 1 tablet (800 mg total) by mouth every 8 (eight) hours as needed (shoulder pain). 21 tablet Montray Kliebert, Janace Aris, MD   nitrofurantoin, macrocrystal-monohydrate, (MACROBID) 100 MG capsule Take 1 capsule (100 mg total) by mouth 2 (two) times daily. 10 capsule Zenia Resides, MD      PDMP not reviewed this encounter.   Zenia Resides, MD 06/21/23 [phone]

## 2023-06-21 NOTE — Discharge Instructions (Addendum)
The pregnancy test was negative The urinalysis showed some red blood cells and white blood cells.  This could be a sign of cystitis.  Take ibuprofen 800 mg--1 tab every 8 hours as needed for pain.  Take nitrofurantoin 100 mg--1 capsule 2 times daily for 5 days  Urine culture is sent and staff will notify you if it shows we need to change the antibiotic

## 2023-06-21 NOTE — ED Triage Notes (Signed)
Pt presents to UC w/ c/o sore throat, hoarseness, runny nose, productive coughing starting 3 days ago. Home remedies: mucinex  Pt reports right arm pain when lifting arm up. She reports she fell on her right shoulder a few weeks ago. She was prescribed ibuprofen which helped, but she has ran out of it.  Pt reports "strong smelling urine a couple weeks ago" which resolved, but states "it came back today." Reports some vaginal spotting but no cramping. Does not have a regular obgyn doctor.

## 2023-06-22 LAB — URINE CULTURE

## 2023-06-23 ENCOUNTER — Ambulatory Visit
Admission: EM | Admit: 2023-06-23 | Discharge: 2023-06-23 | Disposition: A | Discharge status: Home / Self Care | Payer: Medicaid Other | Attending: Nurse Practitioner | Admitting: Nurse Practitioner

## 2023-06-23 ENCOUNTER — Telehealth: Payer: Self-pay

## 2023-06-23 DIAGNOSIS — R350 Frequency of micturition: Secondary | ICD-10-CM | POA: Insufficient documentation

## 2023-06-23 NOTE — Telephone Encounter (Signed)
Pt called to discuss UC results.   Pt will be coming in to do a specimen recollect. Pt expressed understanding.

## 2023-06-24 ENCOUNTER — Encounter (HOSPITAL_BASED_OUTPATIENT_CLINIC_OR_DEPARTMENT_OTHER): Payer: Self-pay | Admitting: Student

## 2023-06-24 ENCOUNTER — Ambulatory Visit: Payer: Self-pay | Admitting: *Deleted

## 2023-06-24 ENCOUNTER — Ambulatory Visit
Admission: EM | Admit: 2023-06-24 | Discharge: 2023-06-24 | Disposition: A | Discharge status: Home / Self Care | Payer: Medicaid Other | Attending: Family Medicine | Admitting: Family Medicine

## 2023-06-24 ENCOUNTER — Ambulatory Visit (INDEPENDENT_AMBULATORY_CARE_PROVIDER_SITE_OTHER): Payer: Medicaid Other

## 2023-06-24 ENCOUNTER — Ambulatory Visit (HOSPITAL_BASED_OUTPATIENT_CLINIC_OR_DEPARTMENT_OTHER): Payer: Medicaid Other

## 2023-06-24 ENCOUNTER — Ambulatory Visit (HOSPITAL_BASED_OUTPATIENT_CLINIC_OR_DEPARTMENT_OTHER): Payer: Medicaid Other | Admitting: Student

## 2023-06-24 DIAGNOSIS — Z0189 Encounter for other specified special examinations: Secondary | ICD-10-CM

## 2023-06-24 DIAGNOSIS — J018 Other acute sinusitis: Secondary | ICD-10-CM

## 2023-06-24 DIAGNOSIS — M25511 Pain in right shoulder: Secondary | ICD-10-CM | POA: Diagnosis not present

## 2023-06-24 DIAGNOSIS — R059 Cough, unspecified: Secondary | ICD-10-CM | POA: Diagnosis not present

## 2023-06-24 DIAGNOSIS — R9389 Abnormal findings on diagnostic imaging of other specified body structures: Secondary | ICD-10-CM | POA: Diagnosis not present

## 2023-06-24 DIAGNOSIS — S42254A Nondisplaced fracture of greater tuberosity of right humerus, initial encounter for closed fracture: Secondary | ICD-10-CM | POA: Diagnosis not present

## 2023-06-24 DIAGNOSIS — G8929 Other chronic pain: Secondary | ICD-10-CM

## 2023-06-24 DIAGNOSIS — R0981 Nasal congestion: Secondary | ICD-10-CM | POA: Diagnosis not present

## 2023-06-24 LAB — URINE CULTURE: Culture: NO GROWTH

## 2023-06-24 MED ORDER — AMOXICILLIN-POT CLAVULANATE 875-125 MG PO TABS: 1.0000 | ORAL_TABLET | Freq: Two times a day (BID) | ORAL | 0 refills | Status: DC

## 2023-06-24 MED ORDER — FLUCONAZOLE 150 MG PO TABS: 150.0000 mg | ORAL_TABLET | ORAL | 0 refills | Status: DC | PRN

## 2023-06-24 MED ORDER — PSEUDOEPHEDRINE HCL 60 MG PO TABS: 60.0000 mg | ORAL_TABLET | Freq: Three times a day (TID) | ORAL | 0 refills | Status: DC | PRN

## 2023-06-24 MED ORDER — PROMETHAZINE-DM 6.25-15 MG/5ML PO SYRP: 5.0000 mL | ORAL_SOLUTION | Freq: Three times a day (TID) | ORAL | 0 refills | Status: DC | PRN

## 2023-06-24 NOTE — ED Triage Notes (Signed)
Pt reports cough, nasal congestion, chest congestion, hoarse x 6 days. Mucinex gives some relief with congestion. Pt reports she was seeing "for a lot of things" 2 days ago. Pt taking taking Macrobid fr UTI stated 2 days ago.   Pt requested COVID test for work.

## 2023-06-24 NOTE — Telephone Encounter (Signed)
Patient seen at [facility]) this AM

## 2023-06-24 NOTE — ED Provider Notes (Signed)
[name]  Note:  This document was prepared using Conservation officer, historic buildings and may include unintentional dictation errors.  MRN: [id number] DOB: 05/15/1981  Subjective:   Breanna Thomas is a 42 y.o. female presenting for 6-day history of acute onset persistent sinus congestion, chest congestion, hoarseness, coughing.  Patient is currently undergoing treatment for and urinary tract infection.  Her work is requiring that she tested for COVID-19.  She was positive already from October 2024.  However, to avoid any issues with her employer, would like to get the testing done.  Patient does report very transient recovery from her illness since the end of October extending into November.  No current facility-administered medications for this encounter.  Current Outpatient Medications:    cetirizine (ZYRTEC) 10 MG tablet, Take 1 tablet (10 mg total) by mouth daily., Disp: 30 tablet, Rfl: 0   hydrOXYzine (ATARAX) 25 MG tablet, Take 0.5-1 tablets (12.5-25 mg total) by mouth every 8 (eight) hours as needed for itching., Disp: 30 tablet, Rfl: 0   ibuprofen (ADVIL) 800 MG tablet, Take 1 tablet (800 mg total) by mouth every 8 (eight) hours as needed (shoulder pain)., Disp: 21 tablet, Rfl: 0   nitrofurantoin, macrocrystal-monohydrate, (MACROBID) 100 MG capsule, Take 1 capsule (100 mg total) by mouth 2 (two) times daily., Disp: 10 capsule, Rfl: 0   omeprazole (PRILOSEC) 20 MG capsule, TAKE 1 CAPSULE(20 MG) BY MOUTH DAILY, Disp: 90 capsule, Rfl: 2   Pseudoephedrine-DM-GG (ROBITUSSIN CF PO), Take by mouth., Disp: , Rfl:    sertraline (ZOLOFT) 50 MG tablet, TAKE 1 TABLET BY MOUTH EVERY DAY, Disp: 90 tablet, Rfl: 0   No Known Allergies  Past Medical History:  Diagnosis Date   Anemia    Anxiety    Anxiety    Panic attacks    Vertigo      Past Surgical History:  Procedure Laterality Date   NO PAST SURGERIES      Family History  Adopted: Yes  Problem Relation  Age of Onset   Diabetes Mother    Hypertension Mother    Heart disease Father    Seizures Father    Seizures Other    Heart failure Other    Diabetes Sister     Social History   Tobacco Use   Smoking status: Never   Smokeless tobacco: Never  Vaping Use   Vaping status: Never Used  Substance Use Topics   Alcohol use: Not Currently    Alcohol/week: 1.0 standard drink of alcohol    Types: 1 Glasses of wine per week    Comment: social   Drug use: No    ROS   Objective:   Vitals: BP (!) 148/98 (BP Location: Right Arm)   Pulse 92   Temp 99.1 F (37.3 C) (Oral)   Resp 18   LMP 06/05/2023 (Approximate)   SpO2 95%   Physical Exam Constitutional:      General: She is not in acute distress.    Appearance: Normal appearance. She is well-developed and normal weight. She is not ill-appearing, toxic-appearing or diaphoretic.  HENT:     Head: Normocephalic and atraumatic.     Right Ear: Tympanic membrane, ear canal and external ear normal. No drainage or tenderness. No middle ear effusion. There is no impacted cerumen. Tympanic membrane is not erythematous or bulging.     Left Ear: Tympanic membrane, ear canal and external ear normal. No drainage or tenderness.  No middle ear effusion. There  is no impacted cerumen. Tympanic membrane is not erythematous or bulging.     Nose: Congestion and rhinorrhea present.     Mouth/Throat:     Mouth: Mucous membranes are moist. No oral lesions.     Pharynx: No pharyngeal swelling, oropharyngeal exudate, posterior oropharyngeal erythema or uvula swelling.     Tonsils: No tonsillar exudate or tonsillar abscesses.  Eyes:     General: No scleral icterus.       Right eye: No discharge.        Left eye: No discharge.     Extraocular Movements: Extraocular movements intact.     Right eye: Normal extraocular motion.     Left eye: Normal extraocular motion.     Conjunctiva/sclera: Conjunctivae normal.  Cardiovascular:     Rate and Rhythm:  Normal rate and regular rhythm.     Heart sounds: Normal heart sounds. No murmur heard.    No friction rub. No gallop.  Pulmonary:     Effort: Pulmonary effort is normal. No respiratory distress.     Breath sounds: No stridor. No wheezing, rhonchi or rales.  Chest:     Chest wall: No tenderness.  Musculoskeletal:     Cervical back: Normal range of motion and neck supple.  Lymphadenopathy:     Cervical: No cervical adenopathy.  Skin:    General: Skin is warm and dry.  Neurological:     General: No focal deficit present.     Mental Status: She is alert and oriented to person, place, and time.  Psychiatric:        Mood and Affect: Mood normal.        Behavior: Behavior normal.    DG Chest 2 View  Result Date: 06/24/2023 CLINICAL DATA:  Cough and nasal congestion. EXAM: CHEST - 2 VIEW COMPARISON:  07/16/2022 FINDINGS: Lungs are clear without focal airspace disease. Mild elevation of the right hemidiaphragm is stable. Heart and mediastinum are within normal limits. Trachea is midline. No acute bone abnormality. IMPRESSION: No active cardiopulmonary disease. Electronically Signed   By: Richarda Overlie M.D.   On: 06/24/2023 10:56     Assessment and Plan :   PDMP not reviewed this encounter.  1. Acute non-recurrent sinusitis of other sinus   2. Patient requested diagnostic testing    COVID testing pending for her employer.  Will start empiric treatment for sinusitis with Augmentin.  Recommended supportive care otherwise. Counseled patient on potential for adverse effects with medications prescribed/recommended today, ER and return-to-clinic precautions discussed, patient verbalized understanding.    Wallis Bamberg, [place] 06/25/23 [phone]

## 2023-06-24 NOTE — Telephone Encounter (Signed)
Summary: UTI symptoms, sore throat & itching   Per agent: "Pt called back, pt requesting sooner appointment. Pt states she has had a sore throat and hoarseness since last Tuesday, 06-18-2023. Pt was seen at [facility] for this, pt also has UTI and states she is on an antibiotic. Pt states she had to have a urine culture done and has not had results back. Pt was told she may have to go on a different antibiotic depending on results. Pt also states she has had itching on and off for quite some time now. Pt was scheduled for 07/15/2023 and put on wait list.  Pt seeking clinical advise."          Chief Complaint: Advise Symptoms: UTI,cough,itching, UC yesterday and this AM Frequency: Went to UC this AM Pertinent Negatives: Patient denies  Disposition: [] ED /[] Urgent Care (no appt availability in office) / [] Appointment(In office/virtual)/ []  [facility]/ [] Home Care/ [] Refused Recommended Disposition /[] [facility]/ [x]  Follow-up with PCP Additional Notes:  Pt states has multiple issues she would like to discuss along with F/U of results. Went to UC today for covid testing and CXR "Sinusitis"  No results of CXR or covid as of yet, also went to UC yesterday, UTI symptoms. Pt placed on ATBs and CX pending.  Agent secured first available 07/15/23. On wait list. Advised to CB with all results and PCP would have additional information and timeframe to secure appt.  Pt verbalizes understanding.  Reason for Disposition  Requesting regular office appointment    Earlier appt  Answer Assessment - Initial Assessment Questions 1. REASON FOR CALL or QUESTION: "What is your reason for calling today?" or "How can I best help you?" or "What question do you have that I can help answer?"     Earlier appt.  Protocols used: Information Only Call - No Triage-A-AH

## 2023-06-24 NOTE — Progress Notes (Signed)
Chief Complaint: Right shoulder pain     History of Present Illness:    Breanna Thomas is a 42 y.o. female presenting today for evaluation of pain in her right shoulder and upper arm.  She was seen by Dr. Roda Shutters earlier this year for a nondisplaced proximal humerus fracture sustained on 02/05/2023.  She was immobilized in a sling and then sent to occupational therapy which she was only able to go to 1 visit due to scheduling conflicts with work.  Patient states that she is having pain and stiffness in the right shoulder traveling down the upper arm.  She rates her pain at a 5/10.  Denies any recent or repeat injury.  Still unable to sleep on her right side.   Surgical History:   None  PMH/PSH/Family History/Social History/Meds/Allergies:    Past Medical History:  Diagnosis Date   Anemia    Anxiety    Anxiety    Panic attacks    Vertigo    Past Surgical History:  Procedure Laterality Date   NO PAST SURGERIES     Social History   Socioeconomic History   Marital status: Divorced    Spouse name: Not on file   Number of children: 3   Years of education: College   Highest education level: Not on file  Occupational History    Employer: [employer]    Comment: [employer]  Tobacco Use   Smoking status: Never   Smokeless tobacco: Never  Vaping Use   Vaping status: Never Used  Substance and Sexual Activity   Alcohol use: Not Currently    Alcohol/week: 1.0 standard drink of alcohol    Types: 1 Glasses of wine per week    Comment: social   Drug use: No   Sexual activity: Not Currently    Birth control/protection: None  Other Topics Concern   Not on file  Social History Narrative   Patient lives at home alone.   Caffeine Use: none in the past 2 weeks, soda occasionally   Social Determinants of Health   Financial Resource Strain: Not on file  Food Insecurity: Not on file  Transportation Needs: Not on file  Physical Activity: Not  on file  Stress: Not on file  Social Connections: Unknown (11/24/2021)   Received from [facility]   Social Network    Social Network: Not on file   Family History  Adopted: Yes  Problem Relation Age of Onset   Diabetes Mother    Hypertension Mother    Heart disease Father    Seizures Father    Seizures Other    Heart failure Other    Diabetes Sister    No Known Allergies Current Outpatient Medications  Medication Sig Dispense Refill   amoxicillin-clavulanate (AUGMENTIN) 875-125 MG tablet Take 1 tablet by mouth 2 (two) times daily. 20 tablet 0   cetirizine (ZYRTEC) 10 MG tablet Take 1 tablet (10 mg total) by mouth daily. 30 tablet 0   fluconazole (DIFLUCAN) 150 MG tablet Take 1 tablet (150 mg total) by mouth every 3 (three) days as needed. 3 tablet 0   hydrOXYzine (ATARAX) 25 MG tablet Take 0.5-1 tablets (12.5-25 mg total) by mouth every 8 (eight) hours as needed for itching. 30 tablet 0   ibuprofen (ADVIL) 800 MG tablet Take  1 tablet (800 mg total) by mouth every 8 (eight) hours as needed (shoulder pain). 21 tablet 0   nitrofurantoin, macrocrystal-monohydrate, (MACROBID) 100 MG capsule Take 1 capsule (100 mg total) by mouth 2 (two) times daily. 10 capsule 0   omeprazole (PRILOSEC) 20 MG capsule TAKE 1 CAPSULE(20 MG) BY MOUTH DAILY 90 capsule 2   promethazine-dextromethorphan (PROMETHAZINE-DM) 6.25-15 MG/5ML syrup Take 5 mLs by mouth 3 (three) times daily as needed for cough. 200 mL 0   pseudoephedrine (SUDAFED) 60 MG tablet Take 1 tablet (60 mg total) by mouth every 8 (eight) hours as needed for congestion. 30 tablet 0   sertraline (ZOLOFT) 50 MG tablet TAKE 1 TABLET BY MOUTH EVERY DAY 90 tablet 0   No current facility-administered medications for this visit.   DG Chest 2 View  Result Date: 06/24/2023 CLINICAL DATA:  Cough and nasal congestion. EXAM: CHEST - 2 VIEW COMPARISON:  07/16/2022 FINDINGS: Lungs are clear without focal airspace disease. Mild elevation of  the right hemidiaphragm is stable. Heart and mediastinum are within normal limits. Trachea is midline. No acute bone abnormality. IMPRESSION: No active cardiopulmonary disease. Electronically Signed   By: Richarda Overlie M.D.   On: 06/24/2023 10:56    Review of Systems:   A ROS was performed including pertinent positives and negatives as documented in the HPI.  Physical Exam :   Constitutional: NAD and appears stated age Neurological: Alert and oriented Psych: Appropriate affect and cooperative Last menstrual period 06/05/2023.   Comprehensive Musculoskeletal Exam:    Musculoskeletal Exam    Inspection Right Left  Skin No atrophy or winging No atrophy or winging  Palpation    Tenderness Muscle belly and proximal aspect of biceps None  Range of Motion    Flexion (passive) 160 160  Flexion (active) 150 160  Abduction 150 150  ER at the side 45 45  Can reach behind back to L1 L1  Strength     5/5 5/5  Special Tests    Pseudoparalytic No No  Neurologic    Fires PIN, radial, median, ulnar, musculocutaneous, axillary, suprascapular, long thoracic, and spinal accessory innervated muscles. No abnormal sensibility  Vascular/Lymphatic    Radial Pulse 2+ 2+  Cervical Exam    Patient has symmetric cervical range of motion with negative Spurling's test.  Special Test: Negative empty can, Neer/Hawkins    Elbow flexion and supination strength 5/5.  Imaging:   Xray (right shoulder 3 views): Proximal humerus fracture appears healed.  Small lucency noted on scapular Y view within newly developed bone of the humeral head compared to prior x-ray.   I personally reviewed and interpreted the radiographs.   Assessment:   42 y.o. female now almost 5 months status post nondisplaced right proximal humerus fracture.  This does appear to have healed in well compared to prior radiographs.  There is a small lucency noted superficially on the superior humeral head however I am not extremely concerned for  this to be significant at this time.  It does appear that she has some residual muscle tightness and stiffness, particularly of the biceps brachii.  She had limited therapy due to scheduling conflicts previously so I would like to get her referred over for a few sessions and HEP to help improve this.  Her overall shoulder ROM is without significant deficit.  I would like her to follow-up with me or Dr. Roda Shutters in 4 weeks to assess progress.  Plan :    -Referral to physical therapy for  residual muscle tightness of the right shoulder -Follow-up in 4 weeks for reassessment     I personally saw and evaluated the patient, and participated in the management and treatment plan.  Hazle Nordmann, PA-C Orthopedics

## 2023-06-24 NOTE — Discharge Instructions (Addendum)
We will manage this as a sinus infection with amoxicillin-clavulanate. For sore throat or cough try using a honey-based tea. Use 3 teaspoons of honey with juice squeezed from half lemon. Place shaved pieces of ginger into 1/2-1 cup of water and warm over stove top. Then mix the ingredients and repeat every 4 hours as needed. Please take ibuprofen 600mg  every 6 hours with food alternating with OR taken together with Tylenol 650mg  every 6 hours for throat pain, fevers, aches and pains. Hydrate very well with at least 2 liters of water. Eat light meals such as soups (chicken and noodles, vegetable, chicken and wild rice).  Do not eat foods that you are allergic to.  Taking an antihistamine like Zyrtec can help against postnasal drainage, sinus congestion which can cause sinus pain, sinus headaches, throat pain, painful swallowing, coughing.  You can take this together with pseudoephedrine (Sudafed) at a dose of 60 mg 3 times a day or twice daily as needed for the same kind of nasal drip, congestion.  Use cough medication as needed.

## 2023-06-25 ENCOUNTER — Telehealth: Payer: Self-pay

## 2023-06-25 LAB — SARS CORONAVIRUS 2 (TAT 6-24 HRS): SARS Coronavirus 2: NEGATIVE

## 2023-06-25 NOTE — Telephone Encounter (Signed)
Copied from CRM [id number]. Topic: General - Other >> Jun 25, 2023  2:48 PM Jannifer Rodney M wrote: Reason for CRM: Pt stated that she went to the UC and the nurse told her to call back with the results. Pt requests call back. Cb# [phone]

## 2023-06-26 NOTE — Telephone Encounter (Signed)
She does have MyChart and is able to see her results on MyChart as well.  COVID test and urine culture are negative.

## 2023-06-26 NOTE — Telephone Encounter (Signed)
Pt is requesting results from labs done at urgent care

## 2023-06-27 NOTE — Telephone Encounter (Signed)
Vm has been left for patient.

## 2023-07-15 ENCOUNTER — Ambulatory Visit: Payer: Medicaid Other | Admitting: Physician Assistant

## 2023-07-30 ENCOUNTER — Ambulatory Visit
Admission: EM | Admit: 2023-07-30 | Discharge: 2023-07-30 | Disposition: A | Discharge status: Home / Self Care | Payer: Medicaid Other | Attending: Family Medicine | Admitting: Family Medicine

## 2023-07-30 DIAGNOSIS — J069 Acute upper respiratory infection, unspecified: Secondary | ICD-10-CM | POA: Diagnosis not present

## 2023-07-30 DIAGNOSIS — J040 Acute laryngitis: Secondary | ICD-10-CM | POA: Diagnosis not present

## 2023-07-30 MED ORDER — PROMETHAZINE-DM 6.25-15 MG/5ML PO SYRP: 5.0000 mL | ORAL_SOLUTION | Freq: Four times a day (QID) | ORAL | 0 refills | Status: DC | PRN

## 2023-07-30 MED ORDER — DEXAMETHASONE 4 MG PO TABS
8.0000 mg | ORAL_TABLET | Freq: Once | ORAL | Status: AC
Start: 2023-07-30 — End: 2023-07-30
  Administered 2023-07-30: 8 mg via ORAL

## 2023-07-30 NOTE — Discharge Instructions (Signed)
 You may take Promethazine  DM as needed for cough.  Presents medication can make you drowsy.  Do not drink alcohol or drive on this medication.  Salt water gargles and warm liquids such as teas and honey will help with your laryngitis.  Lots of rest and fluids.  Please follow-up with your PCP if your symptoms do not improve.  Please go to the ER for any worsening symptoms.  Hope you feel better soon!

## 2023-07-30 NOTE — ED Provider Notes (Signed)
 [facility]    CSN: [id number] Arrival date & time: 07/30/23  1800      History   Chief Complaint No chief complaint on file.   HPI Breanna Thomas is a 43 y.o. female  presents for evaluation of URI symptoms for 2 days. Patient reports associated symptoms of congestion/throat congestion with mild cough and laryngitis. Denies N/V/D, fevers, sore throat, ear pain, body aches, shortness of breath. Patient does not have a hx of asthma. Patient is not an active smoker.    Pt has taken Mucinex  OTC for symptoms. Pt has no other concerns at this time.   HPI  Past Medical History:  Diagnosis Date   Anemia    Anxiety    Anxiety    Panic attacks    Vertigo     Patient Active Problem List   Diagnosis Date Noted   Nondisplaced fracture of greater tuberosity of right humerus, initial encounter for closed fracture 02/07/2023   Major depressive disorder, recurrent episode, moderate with anxious distress (HCC) 03/24/2020   Nonspecific abnormal electrocardiogram (ECG) (EKG) 02/26/2019   Morbid obesity (HCC) 02/26/2019   Chest discomfort 02/26/2019   Erroneous encounter - disregard 01/25/2019   GERD (gastroesophageal reflux disease) 04/20/2016   Cough 04/20/2016   IFG (impaired fasting glucose) 12/30/2013   Vitamin D  deficiency 12/30/2013   Back muscle spasm 12/15/2013   Anxiety 04/10/2013    Past Surgical History:  Procedure Laterality Date   NO PAST SURGERIES      OB History     Gravida  3   Para  3   Term      Preterm      AB      Living         SAB      IAB      Ectopic      Multiple      Live Births               Home Medications    Prior to Admission medications   Medication Sig Start Date End Date Taking? Authorizing Provider  promethazine -dextromethorphan (PROMETHAZINE -DM) 6.25-15 MG/5ML syrup Take 5 mLs by mouth 4 (four) times daily as needed for cough. 07/30/23  Yes Palmer Shorey, Jodi R, NP  amoxicillin -clavulanate (AUGMENTIN ) 875-125  MG tablet Take 1 tablet by mouth 2 (two) times daily. 06/24/23   Christopher Savannah, PA-C  cetirizine  (ZYRTEC ) 10 MG tablet Take 1 tablet (10 mg total) by mouth daily. 05/19/23   Christopher Savannah, PA-C  fluconazole  (DIFLUCAN ) 150 MG tablet Take 1 tablet (150 mg total) by mouth every 3 (three) days as needed. 06/24/23   Christopher Savannah, PA-C  hydrOXYzine  (ATARAX ) 25 MG tablet Take 0.5-1 tablets (12.5-25 mg total) by mouth every 8 (eight) hours as needed for itching. 05/27/23   Christopher Savannah, PA-C  ibuprofen  (ADVIL ) 800 MG tablet Take 1 tablet (800 mg total) by mouth every 8 (eight) hours as needed (shoulder pain). 06/21/23   Vonna Sharlet POUR, MD  nitrofurantoin , macrocrystal-monohydrate, (MACROBID ) 100 MG capsule Take 1 capsule (100 mg total) by mouth 2 (two) times daily. 06/21/23   Vonna Sharlet POUR, MD  omeprazole  (PRILOSEC) 20 MG capsule TAKE 1 CAPSULE(20 MG) BY MOUTH DAILY 12/18/22   Newlin, Enobong, MD  pseudoephedrine  (SUDAFED) 60 MG tablet Take 1 tablet (60 mg total) by mouth every 8 (eight) hours as needed for congestion. 06/24/23   Christopher Savannah, PA-C  sertraline  (ZOLOFT ) 50 MG tablet TAKE 1 TABLET BY MOUTH EVERY DAY  04/11/23   Danton Jon HERO, PA-C    Family History Family History  Adopted: Yes  Problem Relation Age of Onset   Diabetes Mother    Hypertension Mother    Heart disease Father    Seizures Father    Seizures Other    Heart failure Other    Diabetes Sister     Social History Social History   Tobacco Use   Smoking status: Never   Smokeless tobacco: Never  Vaping Use   Vaping status: Never Used  Substance Use Topics   Alcohol use: Not Currently    Alcohol/week: 1.0 standard drink of alcohol    Types: 1 Glasses of wine per week    Comment: social   Drug use: No     Allergies   Patient has no known allergies.   Review of Systems Review of Systems  HENT:  Positive for congestion and voice change.   Respiratory:  Positive for cough.      Physical Exam Triage Vital  Signs ED Triage Vitals  Encounter Vitals Group     BP 07/30/23 1832 (!) 132/95     Systolic BP Percentile --      Diastolic BP Percentile --      Pulse Rate 07/30/23 1832 97     Resp 07/30/23 1832 17     Temp 07/30/23 1832 97.8 F (36.6 C)     Temp Source 07/30/23 1832 Oral     SpO2 07/30/23 1832 97 %     Weight --      Height --      Head Circumference --      Peak Flow --      Pain Score 07/30/23 1831 0     Pain Loc --      Pain Education --      Exclude from Growth Chart --    No data found.  Updated Vital Signs BP (!) 132/95 (BP Location: Right Arm)   Pulse 97   Temp 97.8 F (36.6 C) (Oral)   Resp 17   LMP 07/17/2023 (Exact Date)   SpO2 97%   Visual Acuity Right Eye Distance:   Left Eye Distance:   Bilateral Distance:    Right Eye Near:   Left Eye Near:    Bilateral Near:     Physical Exam Vitals and nursing note reviewed.  Constitutional:      General: She is not in acute distress.    Appearance: She is well-developed. She is not ill-appearing.  HENT:     Head: Normocephalic and atraumatic.     Right Ear: Tympanic membrane and ear canal normal.     Left Ear: Tympanic membrane and ear canal normal.     Nose: Congestion present.     Mouth/Throat:     Mouth: Mucous membranes are moist.     Pharynx: Oropharynx is clear. Uvula midline. No oropharyngeal exudate or posterior oropharyngeal erythema.     Tonsils: No tonsillar exudate or tonsillar abscesses.  Eyes:     Conjunctiva/sclera: Conjunctivae normal.     Pupils: Pupils are equal, round, and reactive to light.  Cardiovascular:     Rate and Rhythm: Normal rate and regular rhythm.     Heart sounds: Normal heart sounds.  Pulmonary:     Effort: Pulmonary effort is normal.     Breath sounds: Normal breath sounds. No wheezing.  Musculoskeletal:     Cervical back: Normal range of motion and neck supple.  Lymphadenopathy:  Cervical: No cervical adenopathy.  Skin:    General: Skin is warm and dry.   Neurological:     General: No focal deficit present.     Mental Status: She is alert and oriented to person, place, and time.  Psychiatric:        Mood and Affect: Mood normal.        Behavior: Behavior normal.      UC Treatments / Results  Labs (all labs ordered are listed, but only abnormal results are displayed) Labs Reviewed - No data to display  EKG   Radiology No results found.  Procedures Procedures (including critical care time)  Medications Ordered in UC Medications  dexamethasone  (DECADRON ) tablet 8 mg (has no administration in time range)    Initial Impression / Assessment and Plan / UC Course  I have reviewed the triage vital signs and the nursing notes.  Pertinent labs & imaging results that were available during my care of the patient were reviewed by me and considered in my medical decision making (see chart for details).     Reviewed exam and symptoms with patient.  No red flags.  Patient declined COVID or flu testing.  Discussed viral illness/laryngitis and symptomatic treatment.  Patient talks a lot for her job and is concerned about the laryngitis.  1 dose dexamethasone  given in clinic for symptom relief.  Advised salt water gargles and warm liquids.  Promethazine  DM as needed for cough.  PCP follow-up if symptoms do not improve.  ER precautions reviewed. Final Clinical Impressions(s) / UC Diagnoses   Final diagnoses:  Laryngitis  Viral upper respiratory illness     Discharge Instructions      You may take Promethazine  DM as needed for cough.  Presents medication can make you drowsy.  Do not drink alcohol or drive on this medication.  Salt water gargles and warm liquids such as teas and honey will help with your laryngitis.  Lots of rest and fluids.  Please follow-up with your PCP if your symptoms do not improve.  Please go to the ER for any worsening symptoms.  Hope you feel better soon!    ED Prescriptions     Medication Sig Dispense Auth.  Provider   promethazine -dextromethorphan (PROMETHAZINE -DM) 6.25-15 MG/5ML syrup Take 5 mLs by mouth 4 (four) times daily as needed for cough. 118 mL Edenilson Austad, Jodi R, NP      PDMP not reviewed this encounter.   Loreda Myla SAUNDERS, NP 07/30/23 1900

## 2023-07-30 NOTE — ED Triage Notes (Addendum)
 Pt presents with c/o loss of voice and coughing up green mucus X 2 days  Denies fever.   Home interventions: mucinex

## 2023-08-05 ENCOUNTER — Ambulatory Visit
Admission: EM | Admit: 2023-08-05 | Discharge: 2023-08-05 | Disposition: A | Discharge status: Home / Self Care | Payer: Medicaid Other | Attending: Family Medicine | Admitting: Family Medicine

## 2023-08-05 ENCOUNTER — Ambulatory Visit (HOSPITAL_BASED_OUTPATIENT_CLINIC_OR_DEPARTMENT_OTHER)
Admission: RE | Admit: 2023-08-05 | Discharge: 2023-08-05 | Disposition: A | Discharge status: Home / Self Care | Payer: Medicaid Other | Source: Ambulatory Visit | Attending: Urgent Care | Admitting: Urgent Care

## 2023-08-05 DIAGNOSIS — R059 Cough, unspecified: Secondary | ICD-10-CM | POA: Diagnosis not present

## 2023-08-05 DIAGNOSIS — R053 Chronic cough: Secondary | ICD-10-CM | POA: Insufficient documentation

## 2023-08-05 DIAGNOSIS — L304 Erythema intertrigo: Secondary | ICD-10-CM

## 2023-08-05 MED ORDER — PROMETHAZINE-DM 6.25-15 MG/5ML PO SYRP: 5.0000 mL | ORAL_SOLUTION | Freq: Three times a day (TID) | ORAL | 0 refills | Status: DC | PRN

## 2023-08-05 MED ORDER — CETIRIZINE HCL 10 MG PO TABS: 10.0000 mg | ORAL_TABLET | Freq: Every day | ORAL | 0 refills | Status: AC

## 2023-08-05 MED ORDER — PREDNISONE 20 MG PO TABS: ORAL_TABLET | ORAL | 0 refills | Status: DC

## 2023-08-05 NOTE — Discharge Instructions (Addendum)
 I have placed orders to have an x-ray done at the med center in [facility].  Please had there now.  Go through the main hospital and not the emergency room.  Once you are there and let them know that you will came to our clinic and we send she to their facility for an outpatient x-ray.  If no one is at the front desk then they are likely out the rest of the day and at that point you would have to go through the emergency room.  Do not check in as a patient through the emergency room.  Simply let them know that you are there for an outpatient x-ray from our clinic.  I will call you with your results and update our treatment plan if necessary after I get the report.  Please wait to go pick up your prescriptions for any medications I have prescribed for you until after we discussed your x-ray results.  We will be using prednisone  as a respiratory boost for your upper and lower respiratory airways. Use cetirizine  and cough syrup as needed.

## 2023-08-05 NOTE — ED Provider Notes (Signed)
 [name]  Note:  This document was prepared using Conservation officer, historic buildings and may include unintentional dictation errors.  MRN: [id number] DOB: [birth year]  Subjective:   Lashina E Roemer is a 43 y.o. female presenting for 2-week history of recurrent coughing, chest congestion, sinus drainage.  Patient was seen a week ago and managed with a dexamethasone  injection for viral respiratory infection and laryngitis.  Prior to that, she was seen 06/24/2023 for a sinus infection.  She completed a course of Augmentin  and supportive care with resolution of her symptoms.  No history of asthma.  No smoking of any kind including cigarettes, cigars, vaping, marijuana use.  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  She also has to use her voice extensively when working with them.  No current facility-administered medications for this encounter.  Current Outpatient Medications:    amoxicillin -clavulanate (AUGMENTIN ) 875-125 MG tablet, Take 1 tablet by mouth 2 (two) times daily., Disp: 20 tablet, Rfl: 0   cetirizine  (ZYRTEC ) 10 MG tablet, Take 1 tablet (10 mg total) by mouth daily., Disp: 30 tablet, Rfl: 0   fluconazole  (DIFLUCAN ) 150 MG tablet, Take 1 tablet (150 mg total) by mouth every 3 (three) days as needed., Disp: 3 tablet, Rfl: 0   hydrOXYzine  (ATARAX ) 25 MG tablet, Take 0.5-1 tablets (12.5-25 mg total) by mouth every 8 (eight) hours as needed for itching., Disp: 30 tablet, Rfl: 0   ibuprofen  (ADVIL ) 800 MG tablet, Take 1 tablet (800 mg total) by mouth every 8 (eight) hours as needed (shoulder pain)., Disp: 21 tablet, Rfl: 0   nitrofurantoin , macrocrystal-monohydrate, (MACROBID ) 100 MG capsule, Take 1 capsule (100 mg total) by mouth 2 (two) times daily., Disp: 10 capsule, Rfl: 0   omeprazole  (PRILOSEC) 20 MG capsule, TAKE 1 CAPSULE(20 MG) BY MOUTH DAILY, Disp: 90 capsule, Rfl: 2    promethazine -dextromethorphan (PROMETHAZINE -DM) 6.25-15 MG/5ML syrup, Take 5 mLs by mouth 4 (four) times daily as needed for cough., Disp: 118 mL, Rfl: 0   pseudoephedrine  (SUDAFED) 60 MG tablet, Take 1 tablet (60 mg total) by mouth every 8 (eight) hours as needed for congestion., Disp: 30 tablet, Rfl: 0   sertraline  (ZOLOFT ) 50 MG tablet, TAKE 1 TABLET BY MOUTH EVERY DAY, Disp: 90 tablet, Rfl: 0   No Known Allergies  Past Medical History:  Diagnosis Date   Anemia    Anxiety    Anxiety    Panic attacks    Vertigo      Past Surgical History:  Procedure Laterality Date   NO PAST SURGERIES      Family History  Adopted: Yes  Problem Relation Age of Onset   Diabetes Mother    Hypertension Mother    Heart disease Father    Seizures Father    Seizures Other    Heart failure Other    Diabetes Sister     Social History   Tobacco Use   Smoking status: Never   Smokeless tobacco: Never  Vaping Use   Vaping status: Never Used  Substance Use Topics   Alcohol use: Not Currently    Alcohol/week: 1.0 standard drink of alcohol    Types: 1 Glasses of wine per week    Comment: social   Drug use: No    ROS   Objective:   Vitals: BP 119/85 (BP Location: Right Arm)   Pulse 96   Temp 98.5 F (36.9 C) (Oral)   Resp  18   LMP 07/17/2023 (Approximate)   SpO2 96%   Physical Exam Constitutional:      General: She is not in acute distress.    Appearance: Normal appearance. She is well-developed and normal weight. She is not ill-appearing, toxic-appearing or diaphoretic.  HENT:     Head: Normocephalic and atraumatic.     Right Ear: Tympanic membrane, ear canal and external ear normal. No drainage or tenderness. No middle ear effusion. There is no impacted cerumen. Tympanic membrane is not erythematous or bulging.     Left Ear: Tympanic membrane, ear canal and external ear normal. No drainage or tenderness.  No middle ear effusion. There is no impacted cerumen. Tympanic membrane is  not erythematous or bulging.     Nose: Congestion present. No rhinorrhea.     Mouth/Throat:     Mouth: Mucous membranes are moist. No oral lesions.     Pharynx: No pharyngeal swelling, oropharyngeal exudate, posterior oropharyngeal erythema or uvula swelling.     Tonsils: No tonsillar exudate or tonsillar abscesses.  Eyes:     General: No scleral icterus.       Right eye: No discharge.        Left eye: No discharge.     Extraocular Movements: Extraocular movements intact.     Right eye: Normal extraocular motion.     Left eye: Normal extraocular motion.     Conjunctiva/sclera: Conjunctivae normal.  Cardiovascular:     Rate and Rhythm: Normal rate and regular rhythm.     Heart sounds: Normal heart sounds. No murmur heard.    No friction rub. No gallop.  Pulmonary:     Effort: Pulmonary effort is normal. No respiratory distress.     Breath sounds: No stridor. No wheezing, rhonchi or rales.  Chest:     Chest wall: No tenderness.  Musculoskeletal:     Cervical back: Normal range of motion and neck supple.  Lymphadenopathy:     Cervical: No cervical adenopathy.  Skin:    General: Skin is warm and dry.  Neurological:     General: No focal deficit present.     Mental Status: She is alert and oriented to person, place, and time.  Psychiatric:        Mood and Affect: Mood normal.        Behavior: Behavior normal.     Assessment and Plan :   PDMP not reviewed this encounter.  1. Persistent cough for 3 weeks or longer   2. Cough, unspecified type   3. Intertrigo     Will pursue outpatient imaging since I do not have a radiology technologist onsite, x-ray order placed.  Recommended deferring antibiotic use, patient was agreeable to antibiotic stewardship.  I will have her use 1 more round of prednisone .  Restart Zyrtec .  Use general supportive care. X-ray over-read was pending at time of discharge, recommended follow up with only abnormal results. Otherwise will not call for  negative over-read. Patient was in agreement.  Counseled patient on potential for adverse effects with medications prescribed/recommended today, ER and return-to-clinic precautions discussed, patient verbalized understanding.      Christopher Savannah, PA-C 08/05/23 1455

## 2023-08-05 NOTE — ED Triage Notes (Signed)
 Pt reports, hoarseness, cough, sore throat for over a week. States she is taking promethazine prescribed last week, and wants antibiotics.

## 2023-08-22 ENCOUNTER — Ambulatory Visit
Admission: RE | Admit: 2023-08-22 | Discharge: 2023-08-22 | Disposition: A | Discharge status: Home / Self Care | Payer: Medicaid Other | Source: Ambulatory Visit | Attending: Family Medicine | Admitting: Family Medicine

## 2023-08-22 ENCOUNTER — Other Ambulatory Visit: Payer: Self-pay

## 2023-08-22 VITALS — BP 137/93 | HR 88 | Temp 98.8°F | Resp 16

## 2023-08-22 DIAGNOSIS — K047 Periapical abscess without sinus: Secondary | ICD-10-CM | POA: Diagnosis not present

## 2023-08-22 MED ORDER — AMOXICILLIN-POT CLAVULANATE 875-125 MG PO TABS: 1.0000 | ORAL_TABLET | Freq: Two times a day (BID) | ORAL | 0 refills | Status: DC

## 2023-08-22 MED ORDER — LIDOCAINE VISCOUS HCL 2 % MT SOLN: 15.0000 mL | Freq: Four times a day (QID) | OROMUCOSAL | 0 refills | Status: DC | PRN

## 2023-08-22 MED ORDER — NAPROXEN 500 MG PO TABS: 500.0000 mg | ORAL_TABLET | Freq: Two times a day (BID) | ORAL | 0 refills | Status: DC | PRN

## 2023-08-22 NOTE — ED Provider Notes (Signed)
[facility]    CSN: [id number] Arrival date & time: 08/22/23  1816      History   Chief Complaint Chief Complaint  Patient presents with   Dental Pain    HPI Breanna Thomas is a 43 y.o. female dents for tooth concern.  Patient reports 4 days of a left upper tooth pain that is worsened.  Denies any injury to the tooth.  Feels like the gumline might be slightly swollen.  No facial swelling, fevers, drainage.  She does have an appointment with her dentist next week.  She has been taking Tylenol and ibuprofen with minimal improvement.  No other concerns at this time.   Dental Pain   Past Medical History:  Diagnosis Date   Anemia    Anxiety    Anxiety    Panic attacks    Vertigo     Patient Active Problem List   Diagnosis Date Noted   Nondisplaced fracture of greater tuberosity of right humerus, initial encounter for closed fracture 02/07/2023   Major depressive disorder, recurrent episode, moderate with anxious distress (HCC) 03/24/2020   Nonspecific abnormal electrocardiogram (ECG) (EKG) 02/26/2019   Morbid obesity (HCC) 02/26/2019   Chest discomfort 02/26/2019   Erroneous encounter - disregard 01/25/2019   GERD (gastroesophageal reflux disease) 04/20/2016   Cough 04/20/2016   IFG (impaired fasting glucose) 12/30/2013   Vitamin D deficiency 12/30/2013   Back muscle spasm 12/15/2013   Anxiety 04/10/2013    Past Surgical History:  Procedure Laterality Date   NO PAST SURGERIES      OB History     Gravida  3   Para  3   Term      Preterm      AB      Living         SAB      IAB      Ectopic      Multiple      Live Births               Home Medications    Prior to Admission medications   Medication Sig Start Date End Date Taking? Authorizing Provider  amoxicillin-clavulanate (AUGMENTIN) 875-125 MG tablet Take 1 tablet by mouth every 12 (twelve) hours. 08/22/23  Yes Radford Pax, NP  lidocaine (XYLOCAINE) 2 % solution Use  as directed 15 mLs in the mouth or throat every 6 (six) hours as needed (tooth pain). Apply to affected tooth with Q-tip.  Do not swallow 08/22/23  Yes Radford Pax, NP  naproxen (NAPROSYN) 500 MG tablet Take 1 tablet (500 mg total) by mouth 2 (two) times daily as needed (tooth pain). 08/22/23  Yes Radford Pax, NP  cetirizine (ZYRTEC) 10 MG tablet Take 1 tablet (10 mg total) by mouth daily. 08/05/23   Wallis Bamberg, PA-C  fluconazole (DIFLUCAN) 150 MG tablet Take 1 tablet (150 mg total) by mouth every 3 (three) days as needed. 06/24/23   Wallis Bamberg, PA-C  hydrOXYzine (ATARAX) 25 MG tablet Take 0.5-1 tablets (12.5-25 mg total) by mouth every 8 (eight) hours as needed for itching. 05/27/23   Wallis Bamberg, PA-C  ibuprofen (ADVIL) 800 MG tablet Take 1 tablet (800 mg total) by mouth every 8 (eight) hours as needed (shoulder pain). 06/21/23   Zenia Resides, MD  nitrofurantoin, macrocrystal-monohydrate, (MACROBID) 100 MG capsule Take 1 capsule (100 mg total) by mouth 2 (two) times daily. 06/21/23   Zenia Resides, MD  omeprazole (PRILOSEC) 20  MG capsule TAKE 1 CAPSULE(20 MG) BY MOUTH DAILY 12/18/22   Hoy Register, MD  predniSONE (DELTASONE) 20 MG tablet Take 2 tablets daily with breakfast. 08/05/23   Wallis Bamberg, PA-C  promethazine-dextromethorphan (PROMETHAZINE-DM) 6.25-15 MG/5ML syrup Take 5 mLs by mouth 3 (three) times daily as needed for cough. 08/05/23   Wallis Bamberg, PA-C  pseudoephedrine (SUDAFED) 60 MG tablet Take 1 tablet (60 mg total) by mouth every 8 (eight) hours as needed for congestion. 06/24/23   Wallis Bamberg, PA-C  sertraline (ZOLOFT) 50 MG tablet TAKE 1 TABLET BY MOUTH EVERY DAY 04/11/23   Anders Simmonds, PA-C    Family History Family History  Adopted: Yes  Problem Relation Age of Onset   Diabetes Mother    Hypertension Mother    Heart disease Father    Seizures Father    Seizures Other    Heart failure Other    Diabetes Sister     Social History Social History   Tobacco  Use   Smoking status: Never   Smokeless tobacco: Never  Vaping Use   Vaping status: Never Used  Substance Use Topics   Alcohol use: Not Currently    Alcohol/week: 1.0 standard drink of alcohol    Types: 1 Glasses of wine per week    Comment: social   Drug use: No     Allergies   Patient has no known allergies.   Review of Systems Review of Systems  HENT:  Positive for dental problem.      Physical Exam Triage Vital Signs ED Triage Vitals  Encounter Vitals Group     BP 08/22/23 1843 (!) 137/93     Systolic BP Percentile --      Diastolic BP Percentile --      Pulse Rate 08/22/23 1843 88     Resp 08/22/23 1843 16     Temp 08/22/23 1843 98.8 F (37.1 C)     Temp Source 08/22/23 1843 Oral     SpO2 08/22/23 1843 98 %     Weight --      Height --      Head Circumference --      Peak Flow --      Pain Score 08/22/23 1844 9     Pain Loc --      Pain Education --      Exclude from Growth Chart --    No data found.  Updated Vital Signs BP (!) 137/93 (BP Location: Left Arm)   Pulse 88   Temp 98.8 F (37.1 C) (Oral)   Resp 16   LMP 07/17/2023 (Approximate)   SpO2 98%   Visual Acuity Right Eye Distance:   Left Eye Distance:   Bilateral Distance:    Right Eye Near:   Left Eye Near:    Bilateral Near:     Physical Exam Vitals and nursing note reviewed.  Constitutional:      General: She is not in acute distress.    Appearance: Normal appearance. She is not ill-appearing.  HENT:     Head: Normocephalic and atraumatic.     Mouth/Throat:      Comments: Tooth #12 with tenderness to palpation to tooth itself as well as gumline slight swelling noted.  No palpable or visible abscess.  No drainage.  No facial swelling. Eyes:     Pupils: Pupils are equal, round, and reactive to light.  Cardiovascular:     Rate and Rhythm: Normal rate.  Pulmonary:  Effort: Pulmonary effort is normal.  Skin:    General: Skin is warm and dry.  Neurological:     General: No  focal deficit present.     Mental Status: She is alert and oriented to person, place, and time.  Psychiatric:        Mood and Affect: Mood normal.        Behavior: Behavior normal.      UC Treatments / Results  Labs (all labs ordered are listed, but only abnormal results are displayed) Labs Reviewed - No data to display  EKG   Radiology No results found.  Procedures Procedures (including critical care time)  Medications Ordered in UC Medications - No data to display  Initial Impression / Assessment and Plan / UC Course  I have reviewed the triage vital signs and the nursing notes.  Pertinent labs & imaging results that were available during my care of the patient were reviewed by me and considered in my medical decision making (see chart for details).     Reviewed symptoms and exam with patient.  No red flags.  Will start Augmentin and naproxen.  Can do lidocaine to the tooth as needed.  Patient to follow-up with her dentist as scheduled appointment next week.  Was instructed to go to the ER for any worsening symptoms that occur before seeing her dentist, red flags reviewed and she verbalized understanding. Final Clinical Impressions(s) / UC Diagnoses   Final diagnoses:  Dental infection     Discharge Instructions      Start Augmentin twice daily for 7 days.  You may take naproxen twice daily as needed for your tooth pain.  You may use a lidocaine by applying it to your tooth with a Q-tip to help numb the area.  Follow-up with your dentist at your scheduled appointment next week.  Please go to the ER if you develop any worsening symptoms.  I hope you feel better soon!    ED Prescriptions     Medication Sig Dispense Auth. Provider   amoxicillin-clavulanate (AUGMENTIN) 875-125 MG tablet Take 1 tablet by mouth every 12 (twelve) hours. 14 tablet Radford Pax, NP   naproxen (NAPROSYN) 500 MG tablet Take 1 tablet (500 mg total) by mouth 2 (two) times daily as needed  (tooth pain). 14 tablet Radford Pax, NP   lidocaine (XYLOCAINE) 2 % solution Use as directed 15 mLs in the mouth or throat every 6 (six) hours as needed (tooth pain). Apply to affected tooth with Q-tip.  Do not swallow 100 mL Radford Pax, NP      PDMP not reviewed this encounter.   Radford Pax, NP 08/22/23 [phone]

## 2023-08-22 NOTE — Discharge Instructions (Addendum)
Start Augmentin twice daily for 7 days.  You may take naproxen twice daily as needed for your tooth pain.  You may use a lidocaine by applying it to your tooth with a Q-tip to help numb the area.  Follow-up with your dentist at your scheduled appointment next week.  Please go to the ER if you develop any worsening symptoms.  I hope you feel better soon!

## 2023-08-22 NOTE — ED Triage Notes (Signed)
C/O dental pain x 4 days. Patient states she has a dental appointment next week but having increased pain now. Patient taking motrin with some relief.

## 2023-08-28 ENCOUNTER — Ambulatory Visit: Payer: Self-pay | Admitting: Family Medicine

## 2023-08-28 NOTE — Telephone Encounter (Addendum)
 2nd attempt; left voicemail for patient to return call for triage. 1st attempt; left voicemail for patient to return call for triage.  Summary: Back pain   Copied From CRM [phone]. Reason for Triage: Patient has been experiencing some lower back/muscle pain for a week now. A nurse was supposed to follow up but she never heard back. Please follow up

## 2023-08-28 NOTE — Telephone Encounter (Signed)
 Chief Complaint: Upper buttock pain Symptoms: Upper buttock pain on left side, limping with left leg Frequency: > 1 week Pertinent Negatives: Patient denies burning with urination, back pain, weakness or numbness Disposition: [] ED /[] Urgent Care (no appt availability in office) / [x] Appointment(In office/virtual)/ []  [facility]/ [] Home Care/ [x] Refused Recommended Disposition /[] [facility]/ []  Follow-up with PCP Additional Notes: Patient called in stating she has been experiencing pain in the top part of her left buttock for roughly a week. Patient describes it as a muscular pain. Patient denies the pain radiating down her leg or to her back, but states she did used to have back pain that has resolved. Patient states this pain in her left buttock causes her to limp, and she only feels the pain when she is walking. Patient denies any treatment for it at this time. Advised patient on OTC pain regimen and heat application. In-office appointment suggested within 3 days, per protocol. Patient stated she will have to be seen in office on Monday due to her work schedule. In-office appointment scheduled for evaluation on Monday.    Reason for Disposition  [1] MODERATE back pain (e.g., interferes with normal activities) AND [2] present > 3 days  Answer Assessment - Initial Assessment Questions 1. ONSET: When did the pain begin?      Over a week 2. LOCATION: Where does it hurt? (upper, mid or lower back)     Left upper side of buttock  3. SEVERITY: How bad is the pain?  (e.g., Scale 1-10; mild, moderate, or severe)   - MILD (1-3): Doesn't interfere with normal activities.    - MODERATE (4-7): Interferes with normal activities or awakens from sleep.    - SEVERE (8-10): Excruciating pain, unable to do any normal activities.      9 4. PATTERN: Is the pain constant? (e.g., yes, no; constant, intermittent)      Intermittent - when I'm walking 5. RADIATION: Does the pain  shoot into your legs or somewhere else?     No but causes limping in left leg 6. CAUSE:  What do you think is causing the back pain?      Unsure 7. BACK OVERUSE:  Any recent lifting of heavy objects, strenuous work or exercise?     No 8. MEDICINES: What have you taken so far for the pain? (e.g., nothing, acetaminophen , NSAIDS)     No 9. NEUROLOGIC SYMPTOMS: Do you have any weakness, numbness, or problems with bowel/bladder control?     No 10. OTHER SYMPTOMS: Do you have any other symptoms? (e.g., fever, abdomen pain, burning with urination, blood in urine)       No  Protocols used: Back Pain-A-AH

## 2023-08-28 NOTE — Telephone Encounter (Signed)
Unable to reach patient, 3 attempts made.

## 2023-09-02 ENCOUNTER — Encounter: Payer: Self-pay | Admitting: Nurse Practitioner

## 2023-09-02 ENCOUNTER — Ambulatory Visit: Payer: Medicaid Other | Attending: Nurse Practitioner | Admitting: Nurse Practitioner

## 2023-09-02 VITALS — BP 136/84 | HR 88 | Ht 64.0 in | Wt 299.0 lb

## 2023-09-02 DIAGNOSIS — Z6841 Body Mass Index (BMI) 40.0 and over, adult: Secondary | ICD-10-CM | POA: Diagnosis not present

## 2023-09-02 DIAGNOSIS — E66813 Obesity, class 3: Secondary | ICD-10-CM | POA: Diagnosis not present

## 2023-09-02 DIAGNOSIS — M533 Sacrococcygeal disorders, not elsewhere classified: Secondary | ICD-10-CM | POA: Diagnosis not present

## 2023-09-02 MED ORDER — IBUPROFEN 800 MG PO TABS: 800.0000 mg | ORAL_TABLET | Freq: Three times a day (TID) | ORAL | 1 refills | Status: DC | PRN

## 2023-09-02 MED ORDER — KETOROLAC TROMETHAMINE 60 MG/2ML IM SOLN
60.0000 mg | Freq: Once | INTRAMUSCULAR | Status: AC
  Administered 2023-09-02: 60 mg via INTRAMUSCULAR

## 2023-09-02 MED ORDER — METHOCARBAMOL 500 MG PO TABS: 500.0000 mg | ORAL_TABLET | Freq: Four times a day (QID) | ORAL | 0 refills | Status: DC

## 2023-09-02 NOTE — Progress Notes (Signed)
Assessment & Plan:  Breanna Thomas was seen today for back pain.  Diagnoses and all orders for this visit:  Sacral pain -     ibuprofen (ADVIL) 800 MG tablet; Take 1 tablet (800 mg total) by mouth every 8 (eight) hours as needed (shoulder pain). -     methocarbamol (ROBAXIN) 500 MG tablet; Take 1 tablet (500 mg total) by mouth 4 (four) times daily. Work on losing weight to help reduce back pain. May alternate with heat and ice application for pain relief. May also alternate with acetaminophen and Ibuprofen as prescribed for back pain. Other alternatives include massage, acupuncture and water aerobics.    Morbid obesity with BMI of 50.0-59.9, adult [facility]) She is requesting referral for weight loss education/treatment. -     Amb Ref to Medical Weight Management    Patient has been counseled on age-appropriate routine health concerns for screening and prevention. These are reviewed and up-to-date. Referrals have been placed accordingly. Immunizations are up-to-date or declined.    Subjective:   Chief Complaint  Patient presents with   Back Pain    Breanna Thomas 43 y.o. female presents to office today for l sacral pain.  She is a patient of Dr. Alvis Lemmings  She has a past medical history of Anemia, Anxiety, Anxiety, Panic attacks, and Vertigo.   Back Pain: Patient presents for presents evaluation of low back problems.  Symptoms include  pain in the sacral area . Initial inciting event: none. Alleviating factors identifiable by patient are sitting. Exacerbating factors identifiable by patient are standing and walking. Treatments so far initiated by patient:  naproxen  Previous lower back problems: none. Previous workup: none. Previous treatments: none. Pain described as muscle aching. When walking the pain is 10/10. Not present when she is sitting.    Review of Systems  Constitutional:  Negative for fever, malaise/fatigue and weight loss.  HENT: Negative.  Negative for nosebleeds.   Eyes:  Negative.  Negative for blurred vision, double vision and photophobia.  Respiratory: Negative.  Negative for cough and shortness of breath.   Cardiovascular: Negative.  Negative for chest pain, palpitations and leg swelling.  Gastrointestinal: Negative.  Negative for heartburn, nausea and vomiting.  Musculoskeletal:  Positive for back pain. Negative for myalgias.  Neurological: Negative.  Negative for dizziness, focal weakness, seizures and headaches.  Psychiatric/Behavioral: Negative.  Negative for suicidal ideas.     Past Medical History:  Diagnosis Date   Anemia    Anxiety    Anxiety    Panic attacks    Vertigo     Past Surgical History:  Procedure Laterality Date   NO PAST SURGERIES      Family History  Adopted: Yes  Problem Relation Age of Onset   Diabetes Mother    Hypertension Mother    Heart disease Father    Seizures Father    Seizures Other    Heart failure Other    Diabetes Sister     Social History Reviewed with no changes to be made today.   Outpatient Medications Prior to Visit  Medication Sig Dispense Refill   cetirizine (ZYRTEC) 10 MG tablet Take 1 tablet (10 mg total) by mouth daily. 30 tablet 0   hydrOXYzine (ATARAX) 25 MG tablet Take 0.5-1 tablets (12.5-25 mg total) by mouth every 8 (eight) hours as needed for itching. 30 tablet 0   lidocaine (XYLOCAINE) 2 % solution Use as directed 15 mLs in the mouth or throat every 6 (six) hours as needed (  tooth pain). Apply to affected tooth with Q-tip.  Do not swallow 100 mL 0   omeprazole (PRILOSEC) 20 MG capsule TAKE 1 CAPSULE(20 MG) BY MOUTH DAILY 90 capsule 2   sertraline (ZOLOFT) 50 MG tablet TAKE 1 TABLET BY MOUTH EVERY DAY 90 tablet 0   ibuprofen (ADVIL) 800 MG tablet Take 1 tablet (800 mg total) by mouth every 8 (eight) hours as needed (shoulder pain). 21 tablet 0   naproxen (NAPROSYN) 500 MG tablet Take 1 tablet (500 mg total) by mouth 2 (two) times daily as needed (tooth pain). 14 tablet 0    amoxicillin-clavulanate (AUGMENTIN) 875-125 MG tablet Take 1 tablet by mouth every 12 (twelve) hours. (Patient not taking: Reported on 09/02/2023) 14 tablet 0   fluconazole (DIFLUCAN) 150 MG tablet Take 1 tablet (150 mg total) by mouth every 3 (three) days as needed. (Patient not taking: Reported on 09/02/2023) 3 tablet 0   nitrofurantoin, macrocrystal-monohydrate, (MACROBID) 100 MG capsule Take 1 capsule (100 mg total) by mouth 2 (two) times daily. (Patient not taking: Reported on 09/02/2023) 10 capsule 0   predniSONE (DELTASONE) 20 MG tablet Take 2 tablets daily with breakfast. (Patient not taking: Reported on 09/02/2023) 10 tablet 0   promethazine-dextromethorphan (PROMETHAZINE-DM) 6.25-15 MG/5ML syrup Take 5 mLs by mouth 3 (three) times daily as needed for cough. (Patient not taking: Reported on 09/02/2023) 200 mL 0   pseudoephedrine (SUDAFED) 60 MG tablet Take 1 tablet (60 mg total) by mouth every 8 (eight) hours as needed for congestion. (Patient not taking: Reported on 09/02/2023) 30 tablet 0   No facility-administered medications prior to visit.    No Known Allergies     Objective:    BP 136/84   Pulse 88   Ht 5\' 4"  (1.626 m)   Wt 299 lb (135.6 kg)   LMP 07/17/2023 (Approximate)   SpO2 99%   BMI 51.32 kg/m  Wt Readings from Last 3 Encounters:  09/02/23 299 lb (135.6 kg)  04/11/23 298 lb 3.2 oz (135.3 kg)  09/06/22 279 lb 3.2 oz (126.6 kg)    Physical Exam Vitals and nursing note reviewed.  Constitutional:      Appearance: She is well-developed.  HENT:     Head: Normocephalic and atraumatic.  Cardiovascular:     Rate and Rhythm: Normal rate and regular rhythm.     Heart sounds: Normal heart sounds. No murmur heard.    No friction rub. No gallop.  Pulmonary:     Effort: Pulmonary effort is normal. No tachypnea or respiratory distress.     Breath sounds: Normal breath sounds. No decreased breath sounds, wheezing, rhonchi or rales.  Chest:     Chest wall: No tenderness.   Abdominal:     General: Bowel sounds are normal.     Palpations: Abdomen is soft.  Musculoskeletal:        General: Normal range of motion.     Cervical back: Normal range of motion.  Skin:    General: Skin is warm and dry.  Neurological:     Mental Status: She is alert and oriented to person, place, and time.     Coordination: Coordination normal.  Psychiatric:        Behavior: Behavior normal. Behavior is cooperative.        Thought Content: Thought content normal.        Judgment: Judgment normal.          Patient has been counseled extensively about nutrition and exercise as well as  the importance of adherence with medications and regular follow-up. The patient was given clear instructions to go to ER or return to medical center if symptoms don't improve, worsen or new problems develop. The patient verbalized understanding.   Follow-up: No follow-ups on file.   Claiborne Rigg, FNP-BC [facility] [place], [place] [phone]   09/02/2023, 11:53 AM

## 2023-09-03 ENCOUNTER — Encounter: Payer: Self-pay | Admitting: Nurse Practitioner

## 2023-09-30 ENCOUNTER — Ambulatory Visit (HOSPITAL_BASED_OUTPATIENT_CLINIC_OR_DEPARTMENT_OTHER)
Admission: RE | Admit: 2023-09-30 | Discharge: 2023-09-30 | Disposition: A | Discharge status: Home / Self Care | Source: Ambulatory Visit | Attending: Urgent Care | Admitting: Urgent Care

## 2023-09-30 ENCOUNTER — Ambulatory Visit
Admission: EM | Admit: 2023-09-30 | Discharge: 2023-09-30 | Disposition: A | Discharge status: Home / Self Care | Attending: Family Medicine | Admitting: Family Medicine

## 2023-09-30 DIAGNOSIS — M545 Low back pain, unspecified: Secondary | ICD-10-CM | POA: Diagnosis not present

## 2023-09-30 DIAGNOSIS — M5432 Sciatica, left side: Secondary | ICD-10-CM

## 2023-09-30 DIAGNOSIS — G8929 Other chronic pain: Secondary | ICD-10-CM | POA: Diagnosis not present

## 2023-09-30 MED ORDER — PREDNISONE 20 MG PO TABS: ORAL_TABLET | ORAL | 0 refills | Status: DC

## 2023-09-30 MED ORDER — CYCLOBENZAPRINE HCL 5 MG PO TABS: 5.0000 mg | ORAL_TABLET | Freq: Every evening | ORAL | 0 refills | Status: DC | PRN

## 2023-09-30 NOTE — Discharge Instructions (Addendum)
 I have placed orders to have an x-ray done at the med center in [facility].  Please had there now.  Go through the main hospital and not the emergency room.  Once you are there and let them know that you will came to our clinic and we send she to their facility for an outpatient x-ray.  If no one is at the front desk then they are likely out the rest of the day and at that point you would have to go through the emergency room.  Do not check in as a patient through the emergency room.  Simply let them know that you are there for an outpatient x-ray from our clinic.  I will call you with your results and update our treatment plan if necessary after I get the report.  Start the prednisone course for your pain and inflammation. Follow up with the spine specialist asap.

## 2023-09-30 NOTE — ED Triage Notes (Signed)
 Pt reports she has left buttock pain and left leg pain x "month and months.Marland KitchenMarland KitchenI really don't know". Muscle relaxer and ibuprofen gives no relief.

## 2023-09-30 NOTE — ED Provider Notes (Signed)
 [name]  Note:  This document was prepared using Conservation officer, historic buildings and may include unintentional dictation errors.  MRN: [id number] DOB: Jan 19, 1981  Subjective:   Breanna Thomas is a 43 y.o. female presenting for several month history of recurrent left-sided low back pain that radiates into the buttock and hip.  Has been seen by her PCP.  Has not had imaging done.  Was advised that the symptoms were related to her back.  She received some kind of injection and then.  Cannot recall what it was.  She did take muscle relaxants.  Unfortunately her back and left hip/buttock area continue to hurt.  No fall, trauma, numbness or tingling, changes to bowel or urinary habits.  No current facility-administered medications for this encounter.  Current Outpatient Medications:    amoxicillin-clavulanate (AUGMENTIN) 875-125 MG tablet, Take 1 tablet by mouth every 12 (twelve) hours. (Patient not taking: Reported on 09/02/2023), Disp: 14 tablet, Rfl: 0   cetirizine (ZYRTEC) 10 MG tablet, Take 1 tablet (10 mg total) by mouth daily., Disp: 30 tablet, Rfl: 0   fluconazole (DIFLUCAN) 150 MG tablet, Take 1 tablet (150 mg total) by mouth every 3 (three) days as needed. (Patient not taking: Reported on 09/02/2023), Disp: 3 tablet, Rfl: 0   hydrOXYzine (ATARAX) 25 MG tablet, Take 0.5-1 tablets (12.5-25 mg total) by mouth every 8 (eight) hours as needed for itching., Disp: 30 tablet, Rfl: 0   ibuprofen (ADVIL) 800 MG tablet, Take 1 tablet (800 mg total) by mouth every 8 (eight) hours as needed (shoulder pain)., Disp: 60 tablet, Rfl: 1   lidocaine (XYLOCAINE) 2 % solution, Use as directed 15 mLs in the mouth or throat every 6 (six) hours as needed (tooth pain). Apply to affected tooth with Q-tip.  Do not swallow, Disp: 100 mL, Rfl: 0   methocarbamol (ROBAXIN) 500 MG tablet, Take 1 tablet (500 mg total) by mouth 4 (four) times daily., Disp: 120 tablet, Rfl: 0   nitrofurantoin,  macrocrystal-monohydrate, (MACROBID) 100 MG capsule, Take 1 capsule (100 mg total) by mouth 2 (two) times daily. (Patient not taking: Reported on 09/02/2023), Disp: 10 capsule, Rfl: 0   omeprazole (PRILOSEC) 20 MG capsule, TAKE 1 CAPSULE(20 MG) BY MOUTH DAILY, Disp: 90 capsule, Rfl: 2   sertraline (ZOLOFT) 50 MG tablet, TAKE 1 TABLET BY MOUTH EVERY DAY, Disp: 90 tablet, Rfl: 0   No Known Allergies  Past Medical History:  Diagnosis Date   Anemia    Anxiety    Anxiety    Panic attacks    Vertigo      Past Surgical History:  Procedure Laterality Date   NO PAST SURGERIES      Family History  Adopted: Yes  Problem Relation Age of Onset   Diabetes Mother    Hypertension Mother    Heart disease Father    Seizures Father    Seizures Other    Heart failure Other    Diabetes Sister     Social History   Tobacco Use   Smoking status: Never   Smokeless tobacco: Never  Vaping Use   Vaping status: Never Used  Substance Use Topics   Alcohol use: Not Currently    Alcohol/week: 1.0 standard drink of alcohol    Types: 1 Glasses of wine per week    Comment: social   Drug use: No    ROS   Objective:   Vitals: BP (!) 142/78 (BP Location: Left Arm)   Pulse  98   Temp 98.9 F (37.2 C) (Oral)   Resp 18   LMP 09/19/2023 (Approximate)   SpO2 98%   Physical Exam Constitutional:      General: She is not in acute distress.    Appearance: Normal appearance. She is well-developed. She is not ill-appearing, toxic-appearing or diaphoretic.  HENT:     Head: Normocephalic and atraumatic.     Nose: Nose normal.     Mouth/Throat:     Mouth: Mucous membranes are moist.  Eyes:     General: No scleral icterus.       Right eye: No discharge.        Left eye: No discharge.     Extraocular Movements: Extraocular movements intact.  Cardiovascular:     Rate and Rhythm: Normal rate.  Pulmonary:     Effort: Pulmonary effort is normal.  Musculoskeletal:     Lumbar back: Spasms and  tenderness present. No swelling, edema, deformity, signs of trauma, lacerations or bony tenderness. Normal range of motion. Positive left straight leg raise test. Negative right straight leg raise test. No scoliosis.       Back:  Skin:    General: Skin is warm and dry.  Neurological:     General: No focal deficit present.     Mental Status: She is alert and oriented to person, place, and time.  Psychiatric:        Mood and Affect: Mood normal.        Behavior: Behavior normal.     Assessment and Plan :   PDMP not reviewed this encounter.  1. Sciatica, left side    Patient would benefit from imaging.  MRI will likely be higher yield an x-ray but will pursue lumbar series today.  Recommended prednisone, muscle relaxant and back care.  Follow-up with [facility].  Counseled patient on potential for adverse effects with medications prescribed/recommended today, ER and return-to-clinic precautions discussed, patient verbalized understanding.    Wallis Bamberg, [place] 09/30/23 [id number]

## 2023-10-21 ENCOUNTER — Ambulatory Visit: Attending: Family Medicine

## 2023-10-21 DIAGNOSIS — Z111 Encounter for screening for respiratory tuberculosis: Secondary | ICD-10-CM

## 2023-10-21 NOTE — Progress Notes (Signed)
 Patient is in office today for a nurse visit for PPD. Patient Injection was given in the  Right arm. Patient tolerated injection well.  Small wheel created.

## 2023-10-22 ENCOUNTER — Encounter (INDEPENDENT_AMBULATORY_CARE_PROVIDER_SITE_OTHER): Payer: Medicaid Other | Admitting: Physician Assistant

## 2023-10-23 ENCOUNTER — Ambulatory Visit: Attending: Family Medicine | Admitting: Family Medicine

## 2023-10-23 ENCOUNTER — Encounter: Payer: Self-pay | Admitting: Family Medicine

## 2023-10-23 VITALS — BP 128/83 | HR 77 | Ht 64.0 in | Wt 285.8 lb

## 2023-10-23 DIAGNOSIS — Z131 Encounter for screening for diabetes mellitus: Secondary | ICD-10-CM | POA: Diagnosis not present

## 2023-10-23 DIAGNOSIS — R140 Abdominal distension (gaseous): Secondary | ICD-10-CM | POA: Diagnosis not present

## 2023-10-23 DIAGNOSIS — Z13228 Encounter for screening for other metabolic disorders: Secondary | ICD-10-CM

## 2023-10-23 LAB — TB SKIN TEST
Induration: 0 mm
TB Skin Test: NEGATIVE

## 2023-10-23 MED ORDER — FLUCONAZOLE 150 MG PO TABS: 150.0000 mg | ORAL_TABLET | ORAL | 0 refills | Status: DC | PRN

## 2023-10-23 NOTE — Progress Notes (Signed)
 Subjective:  Patient ID: Breanna Thomas, female    DOB: [birth year]  Age: 43 y.o. MRN: [id number]  CC: Abdominal Pain     Discussed the use of AI scribe software for clinical note transcription with the patient, who gave verbal consent to proceed.  History of Present Illness The patient, with a history of GAD, GERD, presents with abdominal bloating and a sensation of 'bubbles in my stomach.' She describes feeling full for the past couple of weeks, but denies nausea and vomiting. She has been taking omeprazole for GERD, but admits to inconsistent use, last taking it two to three days ago. She also reports a significant weight loss since her last visit, from 299 lbs to 279 lbs, which she attributes to increased physical activity.  In addition, the patient recently had a tooth extraction and was prescribed amoxicillin for a pre-existing infection and again post-extraction. She has a history of yeast infections with prolonged antibiotic use. She also reports a previous complaint of pain from the hip to the buttocks, which has since resolved with increased physical activity.    Past Medical History:  Diagnosis Date   Anemia    Anxiety    Anxiety    Panic attacks    Vertigo     Past Surgical History:  Procedure Laterality Date   NO PAST SURGERIES      Family History  Adopted: Yes  Problem Relation Age of Onset   Diabetes Mother    Hypertension Mother    Heart disease Father    Seizures Father    Seizures Other    Heart failure Other    Diabetes Sister     Social History   Socioeconomic History   Marital status: Divorced    Spouse name: Not on file   Number of children: 3   Years of education: College   Highest education level: Not on file  Occupational History    Employer: [employer]    Comment: [employer]  Tobacco Use   Smoking status: Never   Smokeless tobacco: Never  Vaping Use   Vaping status: Never Used  Substance and Sexual Activity   Alcohol use:  Not Currently    Alcohol/week: 1.0 standard drink of alcohol    Types: 1 Glasses of wine per week    Comment: social   Drug use: No   Sexual activity: Not Currently    Birth control/protection: None  Other Topics Concern   Not on file  Social History Narrative   Patient lives at home alone.   Caffeine Use: none in the past 2 weeks, soda occasionally   Social Drivers of Corporate investment banker Strain: Not on file  Food Insecurity: Not on file  Transportation Needs: Not on file  Physical Activity: Not on file  Stress: Not on file  Social Connections: Unknown (11/24/2021)   [facility]   Social Network    Social Network: Not on file    No Known Allergies  Outpatient Medications Prior to Visit  Medication Sig Dispense Refill   amoxicillin-clavulanate (AUGMENTIN) 875-125 MG tablet Take 1 tablet by mouth every 12 (twelve) hours. 14 tablet 0   cetirizine (ZYRTEC) 10 MG tablet Take 1 tablet (10 mg total) by mouth daily. 30 tablet 0   hydrOXYzine (ATARAX) 25 MG tablet Take 0.5-1 tablets (12.5-25 mg total) by mouth every 8 (eight) hours as needed for itching. 30 tablet 0   ibuprofen (ADVIL) 800 MG tablet Take 1  tablet (800 mg total) by mouth every 8 (eight) hours as needed (shoulder pain). 60 tablet 1   omeprazole (PRILOSEC) 20 MG capsule TAKE 1 CAPSULE(20 MG) BY MOUTH DAILY 90 capsule 2   sertraline (ZOLOFT) 50 MG tablet TAKE 1 TABLET BY MOUTH EVERY DAY 90 tablet 0   cyclobenzaprine (FLEXERIL) 5 MG tablet Take 1 tablet (5 mg total) by mouth at bedtime as needed. (Patient not taking: Reported on 10/23/2023) 30 tablet 0   lidocaine (XYLOCAINE) 2 % solution Use as directed 15 mLs in the mouth or throat every 6 (six) hours as needed (tooth pain). Apply to affected tooth with Q-tip.  Do not swallow (Patient not taking: Reported on 10/23/2023) 100 mL 0   methocarbamol (ROBAXIN) 500 MG tablet Take 1 tablet (500 mg total) by mouth 4 (four) times daily. (Patient not  taking: Reported on 10/23/2023) 120 tablet 0   nitrofurantoin, macrocrystal-monohydrate, (MACROBID) 100 MG capsule Take 1 capsule (100 mg total) by mouth 2 (two) times daily. (Patient not taking: Reported on 10/23/2023) 10 capsule 0   predniSONE (DELTASONE) 20 MG tablet Take 2 tablets daily with breakfast. (Patient not taking: Reported on 10/23/2023) 10 tablet 0   fluconazole (DIFLUCAN) 150 MG tablet Take 1 tablet (150 mg total) by mouth every 3 (three) days as needed. (Patient not taking: Reported on 10/23/2023) 3 tablet 0   No facility-administered medications prior to visit.     ROS Review of Systems  Constitutional:  Negative for activity change and appetite change.  HENT:  Negative for sinus pressure and sore throat.   Respiratory:  Negative for chest tightness, shortness of breath and wheezing.   Cardiovascular:  Negative for chest pain and palpitations.  Gastrointestinal:  Positive for abdominal pain. Negative for abdominal distention and constipation.  Genitourinary: Negative.   Musculoskeletal: Negative.   Psychiatric/Behavioral:  Negative for behavioral problems and dysphoric mood.     Objective:  BP 128/83   Pulse 77   Ht 5\' 4"  (1.626 m)   Wt 285 lb 12.8 oz (129.6 kg)   LMP 09/19/2023 (Approximate)   SpO2 100%   BMI 49.06 kg/m      10/23/2023   10:29 AM 09/30/2023    2:38 PM 09/02/2023   11:26 AM  BP/Weight  Systolic BP 128 142 136  Diastolic BP 83 78 84  Wt. (Lbs) 285.8  299  BMI 49.06 kg/m2  51.32 kg/m2    Wt Readings from Last 3 Encounters:  10/23/23 285 lb 12.8 oz (129.6 kg)  09/02/23 299 lb (135.6 kg)  04/11/23 298 lb 3.2 oz (135.3 kg)     Physical Exam Constitutional:      Appearance: She is well-developed.  Cardiovascular:     Rate and Rhythm: Normal rate.     Heart sounds: Normal heart sounds. No murmur heard. Pulmonary:     Effort: Pulmonary effort is normal.     Breath sounds: Normal breath sounds. No wheezing or rales.  Chest:     Chest wall: No  tenderness.  Abdominal:     General: Bowel sounds are normal. There is no distension.     Palpations: Abdomen is soft. There is no mass.     Tenderness: There is no abdominal tenderness.  Musculoskeletal:        General: Normal range of motion.     Right lower leg: No edema.     Left lower leg: No edema.  Neurological:     Mental Status: She is alert and oriented to  person, place, and time.  Psychiatric:        Mood and Affect: Mood normal.        Latest Ref Rng & Units 09/06/2022    3:51 PM 04/27/2022    2:15 PM 04/06/2021   12:09 PM  CMP  Glucose 70 - 99 mg/dL 90  87  91   BUN 6 - 24 mg/dL 11  9  10    Creatinine 0.57 - 1.00 mg/dL 4.54  0.98  1.19   Sodium 134 - 144 mmol/L 137  139  138   Potassium 3.5 - 5.2 mmol/L 4.3  5.2  4.5   Chloride 96 - 106 mmol/L 103  104  105   CO2 20 - 29 mmol/L 21  23  23    Calcium 8.7 - 10.2 mg/dL 8.8  9.5  9.4   Total Protein 6.0 - 8.5 g/dL   6.8   Total Bilirubin 0.0 - 1.2 mg/dL   0.3   Alkaline Phos 44 - 121 IU/L   86   AST 0 - 40 IU/L   19   ALT 0 - 32 IU/L   10     Lipid Panel     Component Value Date/Time   CHOL 199 04/06/2021 1209   TRIG 104 04/06/2021 1209   HDL 40 04/06/2021 1209   CHOLHDL 5.0 (H) 04/06/2021 1209   LDLCALC 140 (H) 04/06/2021 1209    CBC    Component Value Date/Time   WBC 7.9 04/27/2022 1235   RBC 4.37 04/27/2022 1235   HGB 12.3 04/27/2022 1235   HGB 11.9 04/06/2021 1209   HCT 41.3 04/27/2022 1235   HCT 38.0 04/06/2021 1209   PLT 112 (L) 04/27/2022 1235   PLT 367 04/06/2021 1209   MCV 94.5 04/27/2022 1235   MCV 89 04/06/2021 1209   MCH 28.1 04/27/2022 1235   MCHC 29.8 (L) 04/27/2022 1235   RDW 12.6 04/27/2022 1235   RDW 11.9 04/06/2021 1209   LYMPHSABS 2.6 04/06/2021 1209   MONOABS 0.4 01/11/2014 1026   EOSABS 0.1 04/06/2021 1209   BASOSABS 0.0 04/06/2021 1209    Lab Results  Component Value Date   HGBA1C 5.2 05/01/2021       Assessment & Plan Abdominal bloating Intermittent bloating  with differential diagnosis including Helicobacter pylori, dietary causes, menstrual cycle, or menopause. Omeprazole may affect Helicobacter pylori testing with false negative result - Hold omeprazole for two weeks before testing. - Order Helicobacter pylori breath test. - Advise dietary modifications if Helicobacter pylori test is negative.  Antibiotic-associated yeast infection Risk for yeast infection due to recent amoxicillin use. Considered preventive treatment with Diflucan. - Prescribe Diflucan for yeast infection prevention.  Constipation Infrequent bowel movements influenced by diet. Mild constipation without consistent straining. Recommend dietary fiber and probiotics. - Increase dietary fiber intake with foods like apples, vegetables, and oatmeal. - Encourage use of spinach in smoothies to aid bowel movements.  Follow-up Need for follow-up lab work and symptom monitoring. Comprehensive blood work planned. - Order comprehensive blood work including diabetes screening, cholesterol, kidney function, and liver function tests. - Schedule lab appointment in two weeks for Helicobacter pylori test and blood work. - Advise fasting after midnight before blood test, allowing water or black coffee. - Review lab results and provide explanation via MyChart.      Meds ordered this encounter  Medications   fluconazole (DIFLUCAN) 150 MG tablet    Sig: Take 1 tablet (150 mg total) by mouth every 3 (three)  days as needed.    Dispense:  3 tablet    Refill:  0    Follow-up: Return in about 6 months (around 04/23/2024) for Chronic medical conditions.       Hoy Register, MD, FAAFP. [facility] [place], [place] [phone]   10/23/2023, 11:26 AM

## 2023-10-23 NOTE — Patient Instructions (Signed)
 VISIT SUMMARY:  During today's visit, we discussed your recent abdominal bloating and sensation of 'bubbles in your stomach,' your history of GERD, recent weight loss, and recent tooth extraction. We also addressed your concerns about potential yeast infections due to antibiotic use and mild constipation.  YOUR PLAN:  -ABDOMINAL BLOATING: Abdominal bloating can be caused by various factors such as Helicobacter pylori infection, diet, menstrual cycle, or menopause. We will hold your omeprazole for two weeks and then perform a Helicobacter pylori breath test. If the test is negative, we will discuss dietary modifications.  -ANTIBIOTIC-ASSOCIATED YEAST INFECTION: Yeast infections can occur with prolonged antibiotic use. To prevent this, we have prescribed Diflucan.  -CONSTIPATION: Constipation is characterized by infrequent bowel movements and can be influenced by diet. We recommend increasing your dietary fiber intake with foods like apples, vegetables, and oatmeal, and considering probiotics such as Activia yogurt. Adding spinach to your smoothies may also help.  INSTRUCTIONS:  Please schedule a lab appointment in two weeks for the Helicobacter pylori test and comprehensive blood work, which will include diabetes screening, cholesterol, kidney function, and liver function tests. Remember to fast after midnight before your blood test, but you may have water or black coffee. We will review your lab results and provide an explanation via MyChart.

## 2023-10-28 ENCOUNTER — Encounter (INDEPENDENT_AMBULATORY_CARE_PROVIDER_SITE_OTHER): Payer: Self-pay

## 2023-11-06 ENCOUNTER — Other Ambulatory Visit: Payer: Self-pay

## 2023-11-07 ENCOUNTER — Ambulatory Visit: Attending: Family Medicine

## 2023-11-07 DIAGNOSIS — Z13228 Encounter for screening for other metabolic disorders: Secondary | ICD-10-CM | POA: Diagnosis not present

## 2023-11-07 DIAGNOSIS — Z131 Encounter for screening for diabetes mellitus: Secondary | ICD-10-CM | POA: Diagnosis not present

## 2023-11-07 DIAGNOSIS — R140 Abdominal distension (gaseous): Secondary | ICD-10-CM | POA: Diagnosis not present

## 2023-11-08 LAB — CBC WITH DIFFERENTIAL/PLATELET
Basophils Absolute: 0 10*3/uL (ref 0.0–0.2)
Basos: 1 %
EOS (ABSOLUTE): 0.1 10*3/uL (ref 0.0–0.4)
Eos: 1 %
Hematocrit: 38.8 % (ref 34.0–46.6)
Hemoglobin: 11.9 g/dL (ref 11.1–15.9)
Immature Grans (Abs): 0 10*3/uL (ref 0.0–0.1)
Immature Granulocytes: 1 %
Lymphocytes Absolute: 2.6 10*3/uL (ref 0.7–3.1)
Lymphs: 40 %
MCH: 27.4 pg (ref 26.6–33.0)
MCHC: 30.7 g/dL — ABNORMAL LOW (ref 31.5–35.7)
MCV: 89 fL (ref 79–97)
Monocytes Absolute: 0.4 10*3/uL (ref 0.1–0.9)
Monocytes: 6 %
Neutrophils Absolute: 3.3 10*3/uL (ref 1.4–7.0)
Neutrophils: 51 %
Platelets: 341 10*3/uL (ref 150–450)
RBC: 4.35 x10E6/uL (ref 3.77–5.28)
RDW: 11.2 % — ABNORMAL LOW (ref 11.7–15.4)
WBC: 6.4 10*3/uL (ref 3.4–10.8)

## 2023-11-08 LAB — LP+NON-HDL CHOLESTEROL
Cholesterol, Total: 174 mg/dL (ref 100–199)
HDL: 40 mg/dL (ref >39)
LDL Chol Calc (NIH): 119 mg/dL — ABNORMAL HIGH (ref 0–99)
Total Non-HDL-Chol (LDL+VLDL): 134 mg/dL — ABNORMAL HIGH (ref 0–129)
Triglycerides: 81 mg/dL (ref 0–149)
VLDL Cholesterol Cal: 15 mg/dL (ref 5–40)

## 2023-11-08 LAB — CMP14+EGFR
ALT: 10 IU/L (ref 0–32)
AST: 20 IU/L (ref 0–40)
Albumin: 3.9 g/dL (ref 3.9–4.9)
Alkaline Phosphatase: 100 IU/L (ref 44–121)
BUN/Creatinine Ratio: 9 (ref 9–23)
BUN: 7 mg/dL (ref 6–24)
Bilirubin Total: 0.4 mg/dL (ref 0.0–1.2)
CO2: 22 mmol/L (ref 20–29)
Calcium: 9.2 mg/dL (ref 8.7–10.2)
Chloride: 107 mmol/L — ABNORMAL HIGH (ref 96–106)
Creatinine, Ser: 0.79 mg/dL (ref 0.57–1.00)
Globulin, Total: 2.9 g/dL (ref 1.5–4.5)
Glucose: 88 mg/dL (ref 70–99)
Potassium: 4.4 mmol/L (ref 3.5–5.2)
Sodium: 141 mmol/L (ref 134–144)
Total Protein: 6.8 g/dL (ref 6.0–8.5)
eGFR: 96 mL/min/{1.73_m2} (ref >59)

## 2023-11-08 LAB — HEMOGLOBIN A1C
Est. average glucose Bld gHb Est-mCnc: 103 mg/dL
Hgb A1c MFr Bld: 5.2 % (ref 4.8–5.6)

## 2023-11-09 LAB — H. PYLORI BREATH TEST

## 2023-11-09 LAB — H. PYLORI BREATH COLLECTION

## 2023-11-11 ENCOUNTER — Other Ambulatory Visit: Payer: Self-pay | Admitting: Family Medicine

## 2023-11-11 ENCOUNTER — Other Ambulatory Visit: Payer: Self-pay

## 2023-11-11 ENCOUNTER — Encounter: Payer: Self-pay | Admitting: Family Medicine

## 2023-11-11 DIAGNOSIS — R140 Abdominal distension (gaseous): Secondary | ICD-10-CM

## 2023-11-12 ENCOUNTER — Encounter: Payer: Self-pay | Admitting: Family Medicine

## 2023-11-12 ENCOUNTER — Other Ambulatory Visit

## 2023-11-12 LAB — H. PYLORI BREATH TEST: H pylori Breath Test: NEGATIVE

## 2023-11-12 LAB — H. PYLORI BREATH COLLECTION

## 2023-11-13 ENCOUNTER — Encounter: Payer: Self-pay | Admitting: Family Medicine

## 2023-11-21 ENCOUNTER — Other Ambulatory Visit: Payer: Self-pay | Admitting: Family Medicine

## 2023-11-21 DIAGNOSIS — Z0000 Encounter for general adult medical examination without abnormal findings: Secondary | ICD-10-CM

## 2023-11-24 ENCOUNTER — Emergency Department (HOSPITAL_BASED_OUTPATIENT_CLINIC_OR_DEPARTMENT_OTHER)
Admission: EM | Admit: 2023-11-24 | Discharge: 2023-11-24 | Disposition: A | Discharge status: Home / Self Care | Attending: Emergency Medicine | Admitting: Emergency Medicine

## 2023-11-24 ENCOUNTER — Other Ambulatory Visit: Payer: Self-pay

## 2023-11-24 ENCOUNTER — Emergency Department (HOSPITAL_BASED_OUTPATIENT_CLINIC_OR_DEPARTMENT_OTHER)

## 2023-11-24 ENCOUNTER — Encounter (HOSPITAL_BASED_OUTPATIENT_CLINIC_OR_DEPARTMENT_OTHER): Payer: Self-pay | Admitting: Emergency Medicine

## 2023-11-24 DIAGNOSIS — R079 Chest pain, unspecified: Secondary | ICD-10-CM | POA: Diagnosis not present

## 2023-11-24 DIAGNOSIS — M5432 Sciatica, left side: Secondary | ICD-10-CM | POA: Insufficient documentation

## 2023-11-24 DIAGNOSIS — M7989 Other specified soft tissue disorders: Secondary | ICD-10-CM | POA: Diagnosis not present

## 2023-11-24 DIAGNOSIS — R209 Unspecified disturbances of skin sensation: Secondary | ICD-10-CM | POA: Diagnosis not present

## 2023-11-24 DIAGNOSIS — R0789 Other chest pain: Secondary | ICD-10-CM | POA: Diagnosis not present

## 2023-11-24 DIAGNOSIS — M79605 Pain in left leg: Secondary | ICD-10-CM | POA: Diagnosis not present

## 2023-11-24 DIAGNOSIS — M79662 Pain in left lower leg: Secondary | ICD-10-CM | POA: Insufficient documentation

## 2023-11-24 DIAGNOSIS — M5442 Lumbago with sciatica, left side: Secondary | ICD-10-CM | POA: Diagnosis not present

## 2023-11-24 DIAGNOSIS — R200 Anesthesia of skin: Secondary | ICD-10-CM | POA: Diagnosis not present

## 2023-11-24 DIAGNOSIS — R2242 Localized swelling, mass and lump, left lower limb: Secondary | ICD-10-CM | POA: Diagnosis not present

## 2023-11-24 LAB — TROPONIN T, HIGH SENSITIVITY
Troponin T High Sensitivity: <15 ng/L (ref <19)
Troponin T High Sensitivity: <15 ng/L (ref <19)

## 2023-11-24 LAB — CBC WITH DIFFERENTIAL/PLATELET
Abs Immature Granulocytes: 0.03 10*3/uL (ref 0.00–0.07)
Basophils Absolute: 0.1 10*3/uL (ref 0.0–0.1)
Basophils Relative: 1 %
Eosinophils Absolute: 0.1 10*3/uL (ref 0.0–0.5)
Eosinophils Relative: 1 %
HCT: 37.7 % (ref 36.0–46.0)
Hemoglobin: 11.9 g/dL — ABNORMAL LOW (ref 12.0–15.0)
Immature Granulocytes: 0 %
Lymphocytes Relative: 34 %
Lymphs Abs: 2.5 10*3/uL (ref 0.7–4.0)
MCH: 28.1 pg (ref 26.0–34.0)
MCHC: 31.6 g/dL (ref 30.0–36.0)
MCV: 88.9 fL (ref 80.0–100.0)
Monocytes Absolute: 0.6 10*3/uL (ref 0.1–1.0)
Monocytes Relative: 8 %
Neutro Abs: 4.3 10*3/uL (ref 1.7–7.7)
Neutrophils Relative %: 56 %
Platelets: 305 10*3/uL (ref 150–400)
RBC: 4.24 MIL/uL (ref 3.87–5.11)
RDW: 12.5 % (ref 11.5–15.5)
WBC: 7.6 10*3/uL (ref 4.0–10.5)
nRBC: 0 % (ref 0.0–0.2)

## 2023-11-24 LAB — BASIC METABOLIC PANEL WITH GFR
Anion gap: 13 (ref 5–15)
BUN: 9 mg/dL (ref 6–20)
CO2: 20 mmol/L — ABNORMAL LOW (ref 22–32)
Calcium: 9 mg/dL (ref 8.9–10.3)
Chloride: 105 mmol/L (ref 98–111)
Creatinine, Ser: 0.83 mg/dL (ref 0.44–1.00)
GFR, Estimated: >60 mL/min (ref >60)
Glucose, Bld: 93 mg/dL (ref 70–99)
Potassium: 4 mmol/L (ref 3.5–5.1)
Sodium: 138 mmol/L (ref 135–145)

## 2023-11-24 LAB — D-DIMER, QUANTITATIVE: D-Dimer, Quant: 0.31 ug{FEU}/mL (ref 0.00–0.50)

## 2023-11-24 LAB — PREGNANCY, URINE: Preg Test, Ur: NEGATIVE

## 2023-11-24 MED ORDER — CYCLOBENZAPRINE HCL 10 MG PO TABS: 10.0000 mg | ORAL_TABLET | Freq: Two times a day (BID) | ORAL | 0 refills | Status: DC | PRN

## 2023-11-24 MED ORDER — PREDNISONE 10 MG (21) PO TBPK: ORAL_TABLET | Freq: Every day | ORAL | 0 refills | Status: DC

## 2023-11-24 MED ORDER — NAPROXEN 500 MG PO TABS: 500.0000 mg | ORAL_TABLET | Freq: Two times a day (BID) | ORAL | 0 refills | Status: DC

## 2023-11-24 NOTE — Discharge Instructions (Addendum)
 Thank you for let us  evaluate you today.  Your 2 troponins/heart enzymes are negative for obvious signs of heart injury.  Your EKG is normal.  Your chest x-ray does not show pneumonia or fluid.  Your ultrasound of your left leg does not show a blood clot.  Your blood work does not show a blood clot.  Likely your left leg pain is secondary to sciatica.  As discussed, please make sure to use compression socks for lower extremity edema.  Please follow-up with primary care provider for further management, reflux management, symptoms following treatment for suspected sciatica  Return to emergency department if you experience worsening symptoms, chest pain, shortness of breath, wheezing

## 2023-11-24 NOTE — ED Provider Notes (Signed)
 [place] [facility] Provider Note   CSN: [id number] Arrival date & time: 11/24/23  1304     History  Chief Complaint  Patient presents with   Numbness   Chest Pain    Breanna Thomas is a 43 y.o. female with past medical history of GERD, BMI 49, sciatica presents emergency department for evaluation of LLE swelling that started yesterday and intermittent chest pain over past week.  She endorses that she started feeling left lower calf pain yesterday that progressed into swelling into the upper thigh with dull feeling. Denies recent travel/surgery/immobilization, hemoptysis, hx of clot, hemoptysis, syncope, shob.  She also complains of chest pain that has been intermittent over the past week.  Described as stabbing and "burning" but is not related to exertion.  No associated shortness of breath, nausea, diaphoresis.  Has GERD and is unsure if it is related to this.  Takes omeprazole  for this.    Chest Pain Associated symptoms: no abdominal pain, no cough, no dizziness, no fatigue, no fever, no headache, no nausea, no numbness, no palpitations, no shortness of breath, no vomiting and no weakness        Home Medications Prior to Admission medications   Medication Sig Start Date End Date Taking? Authorizing Provider  cyclobenzaprine  (FLEXERIL ) 10 MG tablet Take 1 tablet (10 mg total) by mouth 2 (two) times daily as needed for muscle spasms. 11/24/23  Yes Royann Cords, PA  naproxen  (NAPROSYN ) 500 MG tablet Take 1 tablet (500 mg total) by mouth 2 (two) times daily. 11/24/23  Yes Otilio Block, Mackenzie Groom E, PA  predniSONE  (STERAPRED UNI-PAK 21 TAB) 10 MG (21) TBPK tablet Take by mouth daily. Take 6 tabs by mouth daily  for 2 days, then 5 tabs for 2 days, then 4 tabs for 2 days, then 3 tabs for 2 days, 2 tabs for 2 days, then 1 tab by mouth daily for 2 days 11/24/23  Yes Royann Cords, PA  amoxicillin -clavulanate (AUGMENTIN ) 875-125 MG tablet Take 1 tablet by  mouth every 12 (twelve) hours. 08/22/23   Mayer, Jodi R, NP  cetirizine  (ZYRTEC ) 10 MG tablet Take 1 tablet (10 mg total) by mouth daily. 08/05/23   Adolph Hoop, PA-C  cyclobenzaprine  (FLEXERIL ) 5 MG tablet Take 1 tablet (5 mg total) by mouth at bedtime as needed. Patient not taking: Reported on 10/23/2023 09/30/23   Adolph Hoop, PA-C  fluconazole  (DIFLUCAN ) 150 MG tablet Take 1 tablet (150 mg total) by mouth every 3 (three) days as needed. 10/23/23   Newlin, Enobong, MD  hydrOXYzine  (ATARAX ) 25 MG tablet Take 0.5-1 tablets (12.5-25 mg total) by mouth every 8 (eight) hours as needed for itching. 05/27/23   Adolph Hoop, PA-C  ibuprofen  (ADVIL ) 800 MG tablet Take 1 tablet (800 mg total) by mouth every 8 (eight) hours as needed (shoulder pain). 09/02/23   Fleming, Zelda W, NP  lidocaine  (XYLOCAINE ) 2 % solution Use as directed 15 mLs in the mouth or throat every 6 (six) hours as needed (tooth pain). Apply to affected tooth with Q-tip.  Do not swallow Patient not taking: Reported on 10/23/2023 08/22/23   Mayer, Jodi R, NP  methocarbamol  (ROBAXIN ) 500 MG tablet Take 1 tablet (500 mg total) by mouth 4 (four) times daily. Patient not taking: Reported on 10/23/2023 09/02/23   Fleming, Zelda W, NP  nitrofurantoin , macrocrystal-monohydrate, (MACROBID ) 100 MG capsule Take 1 capsule (100 mg total) by mouth 2 (two) times daily. Patient not taking: Reported on  10/23/2023 06/21/23   Ann Keto, MD  omeprazole  (PRILOSEC) 20 MG capsule TAKE 1 CAPSULE(20 MG) BY MOUTH DAILY 12/18/22   Newlin, Enobong, MD  predniSONE  (DELTASONE ) 20 MG tablet Take 2 tablets daily with breakfast. Patient not taking: Reported on 10/23/2023 09/30/23   Adolph Hoop, PA-C  sertraline  (ZOLOFT ) 50 MG tablet TAKE 1 TABLET BY MOUTH EVERY DAY 04/11/23   Hassie Lint, PA-C      Allergies    Patient has no known allergies.    Review of Systems   Review of Systems  Constitutional:  Negative for chills, fatigue and fever.  Respiratory:  Negative for  cough, chest tightness, shortness of breath and wheezing.   Cardiovascular:  Positive for chest pain. Negative for palpitations.  Gastrointestinal:  Negative for abdominal pain, constipation, diarrhea, nausea and vomiting.  Neurological:  Negative for dizziness, seizures, weakness, light-headedness, numbness and headaches.    Physical Exam Updated Vital Signs BP 119/77   Pulse 90   Temp 98.6 F (37 C) (Oral)   Resp 18   Ht 5\' 4"  (1.626 m)   Wt 129.6 kg   LMP 11/17/2023 (Approximate)   SpO2 99%   BMI 49.04 kg/m  Physical Exam Vitals and nursing note reviewed.  Constitutional:      General: She is not in acute distress.    Appearance: Normal appearance. She is not ill-appearing.  HENT:     Head: Normocephalic and atraumatic.     Mouth/Throat:     Mouth: Mucous membranes are moist.     Pharynx: No oropharyngeal exudate or posterior oropharyngeal erythema.  Eyes:     General: No scleral icterus.       Right eye: No discharge.        Left eye: No discharge.     Conjunctiva/sclera: Conjunctivae normal.  Cardiovascular:     Rate and Rhythm: Normal rate.     Pulses: Normal pulses.     Heart sounds: Normal heart sounds. No murmur heard.    No friction rub.  Pulmonary:     Effort: Pulmonary effort is normal. No respiratory distress.     Breath sounds: Normal breath sounds. No stridor. No wheezing or rhonchi.  Chest:     Chest wall: No tenderness.  Abdominal:     General: There is no distension.     Palpations: Abdomen is soft. There is no mass.     Tenderness: There is no abdominal tenderness. There is no guarding.  Musculoskeletal:     Cervical back: Normal range of motion and neck supple. No rigidity or tenderness.     Comments: TTP of left lumbar paraspinous  musculature TTP of posterior calf. No thigh nor posterior knee tenderness. No overlying warmth, erythema, discoloration, gross swelling to BLE. DP 2+.  Sharp sensation intact.1/2 of LLE. 2/2 of RLE.   Lymphadenopathy:     Cervical: No cervical adenopathy.  Skin:    General: Skin is warm.     Capillary Refill: Capillary refill takes less than 2 seconds.     Coloration: Skin is not jaundiced or pale.  Neurological:     Mental Status: She is alert and oriented to person, place, and time. Mental status is at baseline.     ED Results / Procedures / Treatments   Labs (all labs ordered are listed, but only abnormal results are displayed) Labs Reviewed  BASIC METABOLIC PANEL WITH GFR - Abnormal; Notable for the following components:      Result Value  CO2 20 (*)    All other components within normal limits  CBC WITH DIFFERENTIAL/PLATELET - Abnormal; Notable for the following components:   Hemoglobin 11.9 (*)    All other components within normal limits  PREGNANCY, URINE  D-DIMER, QUANTITATIVE  TROPONIN T, HIGH SENSITIVITY  TROPONIN T, HIGH SENSITIVITY    EKG EKG Interpretation Date/Time:  Sunday Nov 24 2023 13:20:42 EDT Ventricular Rate:  96 PR Interval:  116 QRS Duration:  84 QT Interval:  336 QTC Calculation: 425 R Axis:   30  Text Interpretation: Sinus rhythm Borderline short PR interval Confirmed by Lowery Rue [phone]) on 11/24/2023 1:24:15 PM  Radiology US  Venous Img Lower  Left (DVT Study) Result Date: 11/24/2023 CLINICAL DATA:  Left leg pain EXAM: LEFT LOWER EXTREMITY VENOUS DOPPLER ULTRASOUND TECHNIQUE: Gray-scale sonography with compression, as well as color and duplex ultrasound, were performed to evaluate the deep venous system(s) from the level of the common femoral vein through the popliteal and proximal calf veins. COMPARISON:  None Available. FINDINGS: VENOUS Normal compressibility of the common femoral, superficial femoral, and popliteal veins, as well as the visualized calf veins. Visualized portions of profunda femoral vein and great saphenous vein unremarkable. No filling defects to suggest DVT on grayscale or color Doppler imaging. Doppler waveforms show  normal direction of venous flow, normal respiratory plasticity and response to augmentation. Limited views of the contralateral common femoral vein are unremarkable. OTHER None. Limitations: none IMPRESSION: Negative. Electronically Signed   By: Worthy Heads M.D.   On: 11/24/2023 14:56   DG Chest 2 View Result Date: 11/24/2023 CLINICAL DATA:  Left lower extremity swelling and numbness yesterday. Chest pain. EXAM: CHEST - 2 VIEW COMPARISON:  None Available. FINDINGS: The heart size and mediastinal contours are within normal limits. Both lungs are clear. The visualized skeletal structures are unremarkable. IMPRESSION: Negative two view chest x-ray Electronically Signed   By: Audree Leas M.D.   On: 11/24/2023 13:53    Procedures Procedures    Medications Ordered in ED Medications - No data to display  ED Course/ Medical Decision Making/ A&P                                 Medical Decision Making Amount and/or Complexity of Data Reviewed Labs: ordered. Radiology: ordered.  Risk Prescription drug management.   Patient presents to the ED for concern of LLE swelling/pain, CP, this involves an extensive number of treatment options, and is a complaint that carries with it a high risk of complications and morbidity.  The differential diagnosis includes ACS, PE, dissection, DVT, cellulitis, CVA   Co morbidities that complicate the patient evaluation  See HPI   Additional history obtained:  Additional history obtained from Nursing   External records from outside source obtained and reviewed including triage RN note   Lab Tests:  I Ordered, and personally interpreted labs.  The pertinent results include:   Hgb 11.9 (baseline 11.9-12.4 over past 4 years)    Imaging Studies ordered:  I ordered imaging studies including CXR, DVT ultrasound I independently visualized and interpreted imaging which showed no acute cardiopulmonary pathology, no Baker's cyst nor DVT I agree  with the radiologist interpretation   Cardiac Monitoring:  The patient was maintained on a cardiac monitor.  I personally viewed and interpreted the cardiac monitored which showed an underlying rhythm of: NSR with no T wave abnormalities nor ST abnormalities   Medicines ordered and  prescription drug management:  I ordered medication including naproxen , Flexeril , prednisone  for sciatica Reevaluation of the patient after these medicines showed that the patient improved I have reviewed the patients home medicines and have made adjustments as needed     Problem List / ED Course:  Atypical chest pain Troponin negative x 2.  EKG NSR.  Dimer negative Chest pain may be related to reflux as she describes it as more of a "burning". No radiation into back Atypical as it is intermittent and not related to exertion Not currently having CP  Pain and swelling of left lower leg Sciatica left leg Reports swelling of left lower leg.  No gross swelling noted on my exam.  Pain with palpation of posterior calf.  Does note 1/2 sensation.  Sharp sensation intact.  Similarly had decreased sensation since being treated for sciatica 2 months ago.  Urgent care recommended neurosurgery follow-up however patient did not follow-up Despite having 1/2 sensation of LLE.  There is no motor deficit at all.  BLE 5/5.  BUE 5/5 motor in 2/2 sensation so have low suspicion that this is caused by CVA/TIA Ultrasound negative for DVT Also has pain in left lumbar paraspinous musculature.  Seems consistent with sciatica.  No alarm symptoms to include incontinence, saddle paresthesia, malignancy, IVDU so low suspicion for cauda equina and needing emergent MRI Will have pt f/u with neurosurg as recommended by UC and f/u with PCP to see if improvement of symptoms with sciatica treatment Strict return precautions discussed with patient to include worsening chest pain, shortness of breath, gross unilateral swelling.  Unilateral  weakness or numbness of 1 side of their body   Reevaluation:  After the interventions noted above, I reevaluated the patient and found that they have :stayed the same     Dispostion:  After consideration of the diagnostic results and the patients response to treatment, I feel that the patent would benefit from outpatient management with neurology and PCP follow-up.   Discussed ED workup, disposition, return to ED precautions with patient who expresses understanding agrees with plan.  All questions answered to their satisfaction.  They are agreeable to plan.  Discharge instructions provided on paperwork Final Clinical Impression(s) / ED Diagnoses Final diagnoses:  Atypical chest pain  Pain and swelling of left lower leg  Sciatica of left side    Rx / DC Orders ED Discharge Orders          Ordered    predniSONE  (STERAPRED UNI-PAK 21 TAB) 10 MG (21) TBPK tablet  Daily        11/24/23 1830    cyclobenzaprine  (FLEXERIL ) 10 MG tablet  2 times daily PRN        11/24/23 1830    naproxen  (NAPROSYN ) 500 MG tablet  2 times daily        11/24/23 1830              Royann Cords, PA 11/24/23 Mertie Abt    Scarlette Currier, MD 11/25/23 [phone]

## 2023-11-24 NOTE — ED Notes (Signed)
 Attempted IV access to RAC, unsuccessful. States does not want any further attempts for labs/IV. Denies CP/SOB. EDP informed

## 2023-11-24 NOTE — ED Triage Notes (Signed)
 Pt reports she noticed posterior LT thigh swelling yesterday, then it turned into entire LLE swelling and numbness/heaviness per pt; pt is ambulatory w/o difficulty; c/o "zaps" feeling to entire chest x 1 wk

## 2023-11-24 NOTE — ED Notes (Signed)
 ED Provider at bedside.

## 2023-11-29 ENCOUNTER — Encounter (HOSPITAL_COMMUNITY): Payer: Self-pay

## 2023-12-05 ENCOUNTER — Ambulatory Visit (INDEPENDENT_AMBULATORY_CARE_PROVIDER_SITE_OTHER): Admitting: Family Medicine

## 2023-12-05 ENCOUNTER — Encounter (INDEPENDENT_AMBULATORY_CARE_PROVIDER_SITE_OTHER): Payer: Self-pay | Admitting: Family Medicine

## 2023-12-05 VITALS — BP 117/82 | HR 92 | Temp 98.6°F | Ht 64.0 in | Wt 283.0 lb

## 2023-12-05 DIAGNOSIS — K219 Gastro-esophageal reflux disease without esophagitis: Secondary | ICD-10-CM

## 2023-12-05 DIAGNOSIS — R7301 Impaired fasting glucose: Secondary | ICD-10-CM | POA: Diagnosis not present

## 2023-12-05 DIAGNOSIS — Z6841 Body Mass Index (BMI) 40.0 and over, adult: Secondary | ICD-10-CM

## 2023-12-05 NOTE — Progress Notes (Signed)
 Rae Bugler, DO, ABFM, ABOM Bariatric physician [address] [place], [place], [place] [address] Office: [phone]  /  Fax: [phone]     Initial Evaluation:  Breanna Thomas was seen in clinic today to evaluate for Thomas. Breanna is interested in losing weight to improve overall health Breanna reduce the risk of weight related complications. Breanna presents today to review program treatment options, initial physical assessment, Breanna evaluation.     Breanna Thomas, Breanna to orthopedic Thomas or mobility issues (chronic back pain), Breanna Thomas, Breanna Thomas, Breanna Thomas, Breanna Thomas, Breanna Thomas, Breanna other (really started gaining wt after her 1st pregnancy 21 years ago)  Some associated conditions: Chronic Back Pain, GERD  Current nutrition plan: None  Current level of physical Thomas: NEAT  Current or previous pharmacotherapy: None  Response to medication: Never tried medications  Past Medical History:  Diagnosis Date   Anemia    Anxiety    Anxiety    Panic attacks    Vertigo     Current Outpatient Medications  Medication Instructions   amoxicillin -clavulanate (AUGMENTIN ) 875-125 MG tablet 1 tablet, Oral, Every 12 hours   cetirizine  (ZYRTEC ) 10 mg, Oral, Daily   cyclobenzaprine  (FLEXERIL ) 5 mg, Oral, At bedtime PRN   cyclobenzaprine  (FLEXERIL ) 10 mg, Oral, 2 times daily PRN   fluconazole  (DIFLUCAN ) 150 mg, Oral, Every 72 hours PRN   hydrOXYzine  (ATARAX ) 12.5-25 mg, Oral, Every 8 hours PRN   ibuprofen  (ADVIL ) 800 mg, Oral, Every 8 hours PRN   lidocaine  (XYLOCAINE ) 2 % solution 15 mLs, Mouth/Throat, Every 6 hours PRN, Apply to affected tooth with Q-tip.  Do not swallow   methocarbamol  (ROBAXIN ) 500 mg, Oral, 4 times daily   naproxen   (NAPROSYN ) 500 mg, Oral, 2 times daily   nitrofurantoin  (macrocrystal-monohydrate) (MACROBID ) 100 mg, Oral, 2 times daily   omeprazole  (PRILOSEC) 20 MG capsule TAKE 1 CAPSULE(20 MG) BY MOUTH DAILY   predniSONE  (DELTASONE ) 20 MG tablet Take 2 tablets daily with breakfast.   predniSONE  (STERAPRED UNI-PAK 21 TAB) 10 MG (21) TBPK tablet Oral, Daily, Take 6 tabs by mouth daily  for 2 days, then 5 tabs for 2 days, then 4 tabs for 2 days, then 3 tabs for 2 days, 2 tabs for 2 days, then 1 tab by mouth daily for 2 days   sertraline  (ZOLOFT ) 50 MG tablet TAKE 1 TABLET BY MOUTH EVERY DAY     No Known Allergies   Past Surgical History:  Procedure Laterality Date   NO PAST SURGERIES       Family History  Adopted: Yes  Problem Relation Age of Onset   Diabetes Mother    Hypertension Mother    Heart disease Father    Seizures Father    Seizures Other    Heart failure Other    Diabetes Sister      Objective:  BP 117/82   Pulse 92   Temp 98.6 F (37 C)   Ht 5\' 4"  (1.626 m)   Wt 283 lb (128.4 kg)   LMP 11/17/2023 (Approximate)   SpO2 100%   BMI 48.58 kg/m  Breanna was weighed on the bioimpedance scale: Body mass index is 48.58 kg/m.  Visceral Fat %: 18, Body Fat %: 53.4   Weight Lost Since Last Visit: NA  Weight  Gained Since Last Visit: NA   Vitals Temp: 98.6 F (37 C) BP: 117/82 Pulse Rate: 92 SpO2: 100 %   Anthropometric Measurements Height: 5\' 4"  (1.626 m) Weight: 283 lb (128.4 kg) BMI (Calculated): 48.55 Weight at Last Visit: NA Weight Lost Since Last Visit: NA Weight Gained Since Last Visit: NA Starting Weight: NA Total Weight Loss (lbs):  (0 kg) (NA) Peak Weight: 293lb Waist Measurement : 0 inches (NA)   Body Composition  Body Fat %: 53.4 % Fat Mass (lbs): 151.2 lbs Muscle Mass (lbs): 125.2 lbs Total Body Water (lbs): 95.4 lbs Visceral Fat Rating : 18   Other Clinical Data A1c: 0 mg/dL (NA) RMR: 0 (NA) Fasting: No Labs: No Today's Visit #:  InfoSession Starting Date:  (NA) Comments: Info Session    General: Well Developed, well nourished, Breanna in no acute distress.  HEENT: Normocephalic, atraumatic; EOMI, sclerae are anicteric. Skin: Warm Breanna dry, good turgor Chest:  Normal excursion, shape, no gross ABN Respiratory: No conversational dyspnea; speaking in full sentences NeuroM-Sk:  Normal gross ROM * 4 extremities  Psych: A Breanna O *3, insight adequate, mood- full    Assessment Breanna Plan:   FOR THE DISEASE OF Thomas:  Morbid Thomas (HCC) Assessment & Plan: We reviewed anthropometrics, biometrics, associated medical conditions Breanna contributing factors with patient. Breanna would benefit from a medically tailored Breanna calorie nutrional plan based on her REE (resting energy expenditure), which will be determined by indirect calorimetry.  We will also assess for cardiometabolic risk Breanna nutritional derangements via fasting labs at intake appointment.    Thomas Treatment / Action Plan:   Breanna was weighed on the bioimpedance scale Breanna results were discussed Breanna documented in the synopsis.   [name] will complete provided nutritional Breanna psychosocial assessment questionnaire before the next appointment.  Breanna will be scheduled for indirect calorimetry to determine resting energy expenditure in a fasting state.  This will allow us  to create a Breanna calorie, high-protein meal plan to promote loss of fat mass while preserving muscle mass.  We will also assess for cardiometabolic risk Breanna nutritional derangements via an ECG Breanna fasting serologies at her next appointment.  Breanna was encouraged to work on amassing support from family Breanna friends to begin their weight loss journey.   Work on eliminating or reducing the presence of highly processed, poorly nutritious, calorie-dense foods in the home.   Thomas Education Performed Today:  Patient was counseled on nutritional approaches to weight loss Breanna benefits of  reducing processed foods Breanna consuming plant-based foods Breanna high quality protein as part of nutritional weight management program.   We discussed the importance of long term lifestyle changes which include nutrition, exercise Breanna behavioral modifications as well as the importance of customizing this to her specific health Breanna social needs.   We discussed the benefits of reaching a healthier weight to alleviate the symptoms of existing conditions Breanna reduce the risks of the biomechanical, metabolic Breanna psychological effects of Thomas.  Was counseled on the health benefits of losing 5%-10% of total body weight.  Was counseled on our cognitive behavorial therapy program, lead by our bariatric psychologist, who focuses on emotional Breanna Breanna creating positive behavorial change.  Was counseled on bariatric pharmacotherapy Breanna how this may be used as an adjunct in their weight management   Janat appears to be in the action stage of change Breanna states they are ready to start intensive lifestyle modifications Breanna behavioral modifications.  It was recommended that  Breanna follow up in the next 1-2 weeks to review the above steps, Breanna to continue with treatment of their chronic disease state of Thomas   FOR OTHER CONDITIONS RELATED TO THE DISEASE OF Thomas:  IFG (impaired fasting glucose) Assessment & Plan: Most recent A1c: Lab Results  Component Value Date   HGBA1C 5.2 11/07/2023   HGBA1C 5.2 05/01/2021    Diagnosed by her PCP prior. Do not see evidence of a fasting insulin in her medical chart. Her A1c is at goal. We will check her fasting blood glucose, insulin levels, Breanna HbgA1c with intake labs if Breanna decides to join our program. Breanna would benefit from a high protein low-carb meal plan Breanna a regular exercise program.    Gastroesophageal reflux disease, unspecified whether esophagitis present Assessment & Plan: Relevant medication: On a regimen of Omeprazole  20 mg daily with reported good  compliance Breanna tolerance.   Onset: "years ago" per pt. States Breanna feels "bloated all the time" although the Omeprazole  has helped somewhat. Continue same regimen. Encouraged to see a gastroenterologist. Losing 10% or more of body weight may improve condition.   Attestations:   I, Special Puri, acting as a Stage manager for [employer], DO., have compiled all relevant documentation for today's office visit on behalf of Marceil Sensor, DO, while in the presence of [employer], DO.  Reviewed by clinician on day of visit: allergies, medications, problem list, medical history, surgical history, family history, social history, Breanna previous encounter notes pertinent to Thomas diagnosis.    I have spent 45 minutes in the care of the patient today. 38 minutes was spent counseling patient on the disease of Thomas Breanna what our program can do for their medical conditions as well as in preventing future diseases. I discussed the importance of comprehensive care in the treatment of Thomas including mental well being Breanna physical Thomas. This was all in addition to the above counseling. 7 minutes was spent on pre-chart review Breanna additional post visit documentation.   I have reviewed the above documentation for accuracy Breanna completeness, Breanna I agree with the above. Rae Bugler, D.O.  The 21st Century Cures Act was signed into law in 2016 which includes the topic of electronic health records.  This provides immediate access to information in MyChart.  This includes consultation notes, operative notes, office notes, lab results Breanna pathology reports.  If you have any questions about what you read please let us  know at your next visit so we can discuss your concerns Breanna take corrective action if need be.  We are right here with you!

## 2023-12-06 ENCOUNTER — Ambulatory Visit

## 2023-12-09 ENCOUNTER — Ambulatory Visit

## 2023-12-11 ENCOUNTER — Ambulatory Visit: Payer: Self-pay

## 2023-12-11 NOTE — Telephone Encounter (Signed)
  Chief Complaint: lower back pain and swelling to the R buttocks Symptoms: pain and swelling Frequency: about 2 weeks for pain, 1 week for swelling Pertinent Negatives: Patient denies fever, injury, redness, GU s/s,  Disposition: [] ED /[] Urgent Care (no appt availability in office) / [x] Appointment(In office/virtual)/ []  [facility]/ [] Home Care/ [] Refused Recommended Disposition /[] [facility]/ []  Follow-up with PCP Additional Notes: Pt states that she was seen and told it could be sciatica by the ED. Pt states that she has developed swelling in her buttocks over the last week. Pt states that she does not believe there is any redness. Pt states that she "could be constipated".    Copied From CRM [id number]. Reason for Triage: back pain with swelling on left side pain level 4- please call [phone]   Reason for Disposition  [1] MODERATE back pain (e.g., interferes with normal activities) AND [2] present > 3 days  Answer Assessment - Initial Assessment Questions 1. ONSET: "When did the pain begin?"      About 2 weeks ago 2. LOCATION: "Where does it hurt?" (upper, mid or lower back)     Lower R side of back 3. SEVERITY: "How bad is the pain?"  (e.g., Scale 1-10; mild, moderate, or severe)   - MILD (1-3): Doesn't interfere with normal activities.    - MODERATE (4-7): Interferes with normal activities or awakens from sleep.    - SEVERE (8-10): Excruciating pain, unable to do any normal activities.      4 4. PATTERN: "Is the pain constant?" (e.g., yes, no; constant, intermittent)      constant 5. RADIATION: "Does the pain shoot into your legs or somewhere else?"     Lower back to buttocks 6. CAUSE:  "What do you think is causing the back pain?"      Sciatica dx at ED 8. MEDICINES: "What have you taken so far for the pain?" (e.g., nothing, acetaminophen , NSAIDS)     Naproxen  and a muscle relaxer, neither are helpful 9. NEUROLOGIC SYMPTOMS: "Do you have any weakness,  numbness, or problems with bowel/bladder control?"     denies 10. OTHER SYMPTOMS: "Do you have any other symptoms?" (e.g., fever, abdomen pain, burning with urination, blood in urine)       denies 11. PREGNANCY: "Is there any chance you are pregnant?" "When was your last menstrual period?"       denies  Protocols used: Back Pain-A-AH

## 2023-12-12 ENCOUNTER — Telehealth: Payer: Self-pay | Admitting: Family Medicine

## 2023-12-12 ENCOUNTER — Ambulatory Visit
Admission: EM | Admit: 2023-12-12 | Discharge: 2023-12-12 | Disposition: A | Discharge status: Home / Self Care | Attending: Emergency Medicine | Admitting: Emergency Medicine

## 2023-12-12 ENCOUNTER — Other Ambulatory Visit: Payer: Self-pay

## 2023-12-12 DIAGNOSIS — G5702 Lesion of sciatic nerve, left lower limb: Secondary | ICD-10-CM

## 2023-12-12 DIAGNOSIS — M5432 Sciatica, left side: Secondary | ICD-10-CM | POA: Diagnosis not present

## 2023-12-12 MED ORDER — PREDNISONE 20 MG PO TABS: 40.0000 mg | ORAL_TABLET | Freq: Every day | ORAL | 0 refills | Status: AC

## 2023-12-12 NOTE — ED Provider Notes (Signed)
 [facility]    CSN: [id number] Arrival date & time: 12/12/23  1515      History   Chief Complaint Chief Complaint  Patient presents with   Buttock Swelling    HPI Breanna Thomas is a 43 y.o. female.  2 week history of left lower back pain that radiates into the left glute. Reports the area feels numb. Does not radiate down the leg. No weakness of extremities.  She initially presented for "swelling" of the left buttocks. This sensation for about 4 days. No redness, rash, or skin changes. Constipation for 3-4 days. Last BM 2 days ago, hard stool and had to strain. No blood. No urinary symptoms.   She has been seen for left side sciatica in the past, most recently 5/4. Was given prednisone  but didn't take it.  Denies fever, bladder or bowel incontinence, saddle anesthesia, any back or buttocks trauma  Past Medical History:  Diagnosis Date   Anemia    Anxiety    Anxiety    Panic attacks    Vertigo     Patient Active Problem List   Diagnosis Date Noted   Nondisplaced fracture of greater tuberosity of right humerus, initial encounter for closed fracture 02/07/2023   Major depressive disorder, recurrent episode, moderate with anxious distress (HCC) 03/24/2020   Nonspecific abnormal electrocardiogram (ECG) (EKG) 02/26/2019   Morbid obesity (HCC) 02/26/2019   Chest discomfort 02/26/2019   Erroneous encounter - disregard 01/25/2019   GERD (gastroesophageal reflux disease) 04/20/2016   Cough 04/20/2016   IFG (impaired fasting glucose) 12/30/2013   Vitamin D  deficiency 12/30/2013   Back muscle spasm 12/15/2013   Anxiety 04/10/2013    Past Surgical History:  Procedure Laterality Date   NO PAST SURGERIES      OB History     Gravida  3   Para  3   Term      Preterm      AB      Living         SAB      IAB      Ectopic      Multiple      Live Births               Home Medications    Prior to Admission medications   Medication  Sig Start Date End Date Taking? Authorizing Provider  predniSONE  (DELTASONE ) 20 MG tablet Take 2 tablets (40 mg total) by mouth daily with breakfast for 5 days. 12/12/23 12/17/23 Yes Rahshawn Remo, Ivette Marks, PA-C  cetirizine  (ZYRTEC ) 10 MG tablet Take 1 tablet (10 mg total) by mouth daily. 08/05/23   Adolph Hoop, PA-C  hydrOXYzine  (ATARAX ) 25 MG tablet Take 0.5-1 tablets (12.5-25 mg total) by mouth every 8 (eight) hours as needed for itching. 05/27/23   Adolph Hoop, PA-C  omeprazole  (PRILOSEC) 20 MG capsule TAKE 1 CAPSULE(20 MG) BY MOUTH DAILY 12/18/22   Newlin, Enobong, MD  sertraline  (ZOLOFT ) 50 MG tablet TAKE 1 TABLET BY MOUTH EVERY DAY 04/11/23   Hassie Lint, PA-C    Family History Family History  Adopted: Yes  Problem Relation Age of Onset   Diabetes Mother    Hypertension Mother    Heart disease Father    Seizures Father    Seizures Other    Heart failure Other    Diabetes Sister     Social History Social History   Tobacco Use   Smoking status: Never   Smokeless tobacco: Never  Vaping Use  Vaping status: Never Used  Substance Use Topics   Alcohol use: Not Currently    Alcohol/week: 1.0 standard drink of alcohol    Types: 1 Glasses of wine per week    Comment: social   Drug use: No     Allergies   Patient has no known allergies.   Review of Systems Review of Systems  As per HPI  Physical Exam Triage Vital Signs ED Triage Vitals [12/12/23 1616]  Encounter Vitals Group     BP      Systolic BP Percentile      Diastolic BP Percentile      Pulse      Resp      Temp      Temp src      SpO2      Weight      Height      Head Circumference      Peak Flow      Pain Score 6     Pain Loc      Pain Education      Exclude from Growth Chart    No data found.  Updated Vital Signs BP 133/88   Pulse 100   Temp 98.1 F (36.7 C) (Oral)   Resp 17   LMP 11/17/2023 (Approximate)   SpO2 98%    Physical Exam Vitals and nursing note reviewed. Exam conducted with a  chaperone present [facility] CMA.).  Constitutional:      General: She is not in acute distress. HENT:     Mouth/Throat:     Pharynx: Oropharynx is clear.  Cardiovascular:     Rate and Rhythm: Normal rate and regular rhythm.     Pulses: Normal pulses.     Heart sounds: Normal heart sounds.  Pulmonary:     Effort: Pulmonary effort is normal.     Breath sounds: Normal breath sounds.  Genitourinary:    Rectum: Normal. No mass or external hemorrhoid. Normal anal tone.  Musculoskeletal:        General: No tenderness.     Cervical back: Normal range of motion.  Skin:    Capillary Refill: Capillary refill takes less than 2 seconds.          Comments: Habitus limits exam. There is tenderness of left low back into glute. There is no visible swelling of either glute although patient points to lower inner glute as sensation of swelling. No skin changes. No fluctuance, induration, erythema. Rectum is normal without hemorrhoids or mass  Neurological:     Mental Status: She is alert and oriented to person, place, and time.     UC Treatments / Results  Labs (all labs ordered are listed, but only abnormal results are displayed) Labs Reviewed - No data to display  EKG  Radiology No results found.  Procedures Procedures   Medications Ordered in UC Medications - No data to display  Initial Impression / Assessment and Plan / UC Course  I have reviewed the triage vital signs and the nursing notes.  Pertinent labs & imaging results that were available during my care of the patient were reviewed by me and considered in my medical decision making (see chart for details).  Afebrile, well appearing, stable vitals Swelling sensation of left buttock per patient. Not noted on exam. She does have left low back and upper glute tenderness. I suspect she does have sciatica and pyriformis syndrome, which may contribute to swelling/fullness sensation. She was given prednisone  at her ED visit  several  weeks ago but did not take it.  I do recommend she take prednisone  burst, 40 mg daily for 5 days.  Discussed this would be beneficial for any pain and swelling. She reports constipation for several days, last BM 2 days ago.  I recommend trying MiraLAX, twice daily for a few days followed by once daily. No red flags at this time. Strict precautions are discussed; if she does not have a bowel movement after 5 days or feels swelling/pain worsens, ED precautions.  Otherwise close follow-up with primary care.  Patient is agreeable to plan, all questions are answered  Final Clinical Impressions(s) / UC Diagnoses   Final diagnoses:  Pyriformis syndrome, left  Sciatica, left side     Discharge Instructions      Starting tomorrow morning; prednisone  2 tablets daily for 5 days in a row Please finish all of this!  Miralax (polyethylene glycol) 1 dose morning and 1 dose night for 3 days in a row, then just in the morning for another 3-4 days.   If you do NOT have a bowel movement after 5 days of miralax, or if pain/swelling worsens after finishing the prednisone , please go to the emergency department. Otherwise follow up with your primary care provider.    ED Prescriptions     Medication Sig Dispense Auth. Provider   predniSONE  (DELTASONE ) 20 MG tablet Take 2 tablets (40 mg total) by mouth daily with breakfast for 5 days. 10 tablet Lilliann Rossetti, Ivette Marks, PA-C      PDMP not reviewed this encounter.   Creighton Doffing, PA-C 12/12/23 1701

## 2023-12-12 NOTE — ED Triage Notes (Addendum)
 Pt states the lower left buttock area is swollenx4d. Pt states the area feels numb.

## 2023-12-12 NOTE — Telephone Encounter (Signed)
Please see previous noted.

## 2023-12-12 NOTE — Telephone Encounter (Signed)
 Copied from CRM [id number]. Topic: General - Call Back - No Documentation >> Dec 12, 2023  1:09 PM Oddis Bench wrote:  Reason for CRM: Patient is retuning call to Cathy/Cassandra did not state what is about.

## 2023-12-12 NOTE — Telephone Encounter (Addendum)
 Patient called with complaints of pain in her buloccks also reports that they swollen. Voiced that the swelling was in her thigh days again. Patient also reports that she has not been able to have a BM. Patient requesting appointment with PCP today or tomorrow.  Advised patient that we do not have any immediated appointments right now and our moblie unit is not in the area today and will not be in operation tomorrow. Advised patient to  go to ED or UC. Patient in agreement.

## 2023-12-12 NOTE — Discharge Instructions (Addendum)
 Starting tomorrow morning; prednisone  2 tablets daily for 5 days in a row Please finish all of this!  Miralax (polyethylene glycol) 1 dose morning and 1 dose night for 3 days in a row, then just in the morning for another 3-4 days.   If you do NOT have a bowel movement after 5 days of miralax, or if pain/swelling worsens after finishing the prednisone , please go to the emergency department. Otherwise follow up with your primary care provider.

## 2023-12-12 NOTE — Telephone Encounter (Signed)
 Patient was called and given appointment with Madelyn Schick for 12/27/23

## 2023-12-25 ENCOUNTER — Ambulatory Visit

## 2023-12-26 ENCOUNTER — Telehealth (INDEPENDENT_AMBULATORY_CARE_PROVIDER_SITE_OTHER): Payer: Self-pay | Admitting: Primary Care

## 2023-12-26 NOTE — Telephone Encounter (Signed)
 Called pt to confirm appt. Pt did not answer and LVM

## 2023-12-27 ENCOUNTER — Ambulatory Visit (INDEPENDENT_AMBULATORY_CARE_PROVIDER_SITE_OTHER): Admitting: Primary Care

## 2023-12-29 ENCOUNTER — Ambulatory Visit
Admission: EM | Admit: 2023-12-29 | Discharge: 2023-12-29 | Disposition: A | Discharge status: Home / Self Care | Attending: Family Medicine | Admitting: Family Medicine

## 2023-12-29 ENCOUNTER — Ambulatory Visit: Payer: Self-pay | Admitting: Urgent Care

## 2023-12-29 ENCOUNTER — Ambulatory Visit (INDEPENDENT_AMBULATORY_CARE_PROVIDER_SITE_OTHER)

## 2023-12-29 DIAGNOSIS — R0789 Other chest pain: Secondary | ICD-10-CM

## 2023-12-29 DIAGNOSIS — R140 Abdominal distension (gaseous): Secondary | ICD-10-CM | POA: Diagnosis not present

## 2023-12-29 DIAGNOSIS — R0781 Pleurodynia: Secondary | ICD-10-CM | POA: Diagnosis not present

## 2023-12-29 MED ORDER — FAMOTIDINE 20 MG PO TABS: 20.0000 mg | ORAL_TABLET | Freq: Two times a day (BID) | ORAL | 0 refills | Status: DC

## 2023-12-29 NOTE — ED Triage Notes (Signed)
 Pt c/o "chest feels cold-cold sensation" x 2 days-denies as pain-NAD-steady gait

## 2023-12-29 NOTE — ED Provider Notes (Signed)
 [name]  Note:  This document was prepared using Conservation officer, historic buildings and may include unintentional dictation errors.  MRN: [id number] DOB: 12/26/1980  Subjective:   Breanna Thomas is a 43 y.o. female presenting for 2-day history of acute onset cold chest sensation internally.  Feels like it is radiating in her esophagus as well.  No fever, cough, shortness of breath, wheezing, sinus symptoms.  Has a history of acid reflux, takes omeprazole .  No history of asthma.  No smoking of any kind including cigarettes, cigars, vaping, marijuana use.  No history of cardiac conditions.  No drug use.  No diabetes.    No current facility-administered medications for this encounter.  Current Outpatient Medications:    cetirizine  (ZYRTEC ) 10 MG tablet, Take 1 tablet (10 mg total) by mouth daily., Disp: 30 tablet, Rfl: 0   hydrOXYzine  (ATARAX ) 25 MG tablet, Take 0.5-1 tablets (12.5-25 mg total) by mouth every 8 (eight) hours as needed for itching., Disp: 30 tablet, Rfl: 0   omeprazole  (PRILOSEC) 20 MG capsule, TAKE 1 CAPSULE(20 MG) BY MOUTH DAILY, Disp: 90 capsule, Rfl: 2   sertraline  (ZOLOFT ) 50 MG tablet, TAKE 1 TABLET BY MOUTH EVERY DAY, Disp: 90 tablet, Rfl: 0   No Known Allergies  Past Medical History:  Diagnosis Date   Anemia    Anxiety    Anxiety    Panic attacks    Vertigo      Past Surgical History:  Procedure Laterality Date   NO PAST SURGERIES      Family History  Adopted: Yes  Problem Relation Age of Onset   Diabetes Mother    Hypertension Mother    Heart disease Father    Seizures Father    Seizures Other    Heart failure Other    Diabetes Sister     Social History   Tobacco Use   Smoking status: Never   Smokeless tobacco: Never  Vaping Use   Vaping status: Never Used  Substance Use Topics   Alcohol use: Not Currently   Drug use: No    ROS   Objective:   Vitals: BP (!) 125/90 (BP Location: Right Arm)   Pulse  (!) 101   Temp 98.3 F (36.8 C) (Oral)   Resp 20   LMP 12/24/2023   SpO2 96%   Physical Exam Constitutional:      General: She is not in acute distress.    Appearance: Normal appearance. She is well-developed. She is not ill-appearing, toxic-appearing or diaphoretic.  HENT:     Head: Normocephalic and atraumatic.     Nose: Nose normal.     Mouth/Throat:     Mouth: Mucous membranes are moist.  Eyes:     General: No scleral icterus.       Right eye: No discharge.        Left eye: No discharge.     Extraocular Movements: Extraocular movements intact.  Cardiovascular:     Rate and Rhythm: Normal rate and regular rhythm.     Heart sounds: Normal heart sounds. No murmur heard.    No friction rub. No gallop.  Pulmonary:     Effort: Pulmonary effort is normal. No respiratory distress.     Breath sounds: No stridor. No wheezing, rhonchi or rales.  Chest:     Chest wall: No tenderness.  Skin:    General: Skin is warm and dry.  Neurological:     General: No focal deficit present.  Mental Status: She is alert and oriented to person, place, and time.  Psychiatric:        Mood and Affect: Mood normal.        Behavior: Behavior normal.     DG Chest 2 View Result Date: 12/29/2023 CLINICAL DATA:  pleuritic pain EXAM: CHEST - 2 VIEW COMPARISON:  11/24/2023. FINDINGS: Cardiac silhouette is unremarkable. No pneumothorax or pleural effusion. The lungs are clear. The visualized skeletal structures are unremarkable. IMPRESSION: No acute cardiopulmonary process. Electronically Signed   By: Sydell Eva M.D.   On: 12/29/2023 12:18     Assessment and Plan :   PDMP not reviewed this encounter.  1. Atypical chest pain   2. Abdominal bloating    Recommended adding famotidine to her regimen.  At discharge, she also requested referral to GI as she has chronic GERD and abdominal bloating.  I provided her with this referral.  Counseled patient on potential for adverse effects with  medications prescribed/recommended today, ER and return-to-clinic precautions discussed, patient verbalized understanding.    Adolph Hoop, [place] 12/29/23 1327

## 2024-01-02 ENCOUNTER — Encounter: Payer: Self-pay | Admitting: Gastroenterology

## 2024-01-02 ENCOUNTER — Other Ambulatory Visit: Payer: Self-pay

## 2024-01-02 ENCOUNTER — Encounter (HOSPITAL_COMMUNITY): Payer: Self-pay

## 2024-01-02 ENCOUNTER — Ambulatory Visit (HOSPITAL_COMMUNITY)
Admission: EM | Admit: 2024-01-02 | Discharge: 2024-01-02 | Disposition: A | Discharge status: Home / Self Care | Attending: Physician Assistant | Admitting: Physician Assistant

## 2024-01-02 DIAGNOSIS — R140 Abdominal distension (gaseous): Secondary | ICD-10-CM

## 2024-01-02 MED ORDER — SUCRALFATE 1 G PO TABS
1.0000 g | ORAL_TABLET | Freq: Three times a day (TID) | ORAL | 0 refills | Status: AC
Start: 2024-01-02 — End: ?

## 2024-01-02 NOTE — ED Triage Notes (Signed)
 Pt c/o cold feeling going up her chest x1 wk. States for the past 2-3 days having a heavy pressure feeling when she wakes up for 30-40 mins. Denies taking meds. States has a GI in July.and PCP 6/23.

## 2024-01-02 NOTE — ED Provider Notes (Signed)
 [facility]    CSN: [id number] Arrival date & time: 01/02/24  1512      History   Chief Complaint Chief Complaint  Patient presents with   Abdominal Pain    HPI Breanna Thomas is a 43 y.o. female.   Patient presents today for evaluation of continued cold sensation in her chest.  She notes that she was prescribed famotidine but this has not been helpful.  She reports some bloating and general.  She reports some heavy feeling in the area when she wakes up.  She denies any chest pain or shortness of breath.  She has appointment with GI next month.  She also reports that she has had some pressure to the back of her head after she has lied flat and sits up.  This does resolve spontaneously.  She denies any changes in pillow or bedding.  She states this does only occur when she is laying flat.  Her hairstyle is when she has had before per patient.    The history is provided by the patient.  Abdominal Pain Associated symptoms: no chest pain, no chills, no fever, no nausea, no shortness of breath and no vomiting     Past Medical History:  Diagnosis Date   Anemia    Anxiety    Anxiety    Panic attacks    Vertigo     Patient Active Problem List   Diagnosis Date Noted   Nondisplaced fracture of greater tuberosity of right humerus, initial encounter for closed fracture 02/07/2023   Major depressive disorder, recurrent episode, moderate with anxious distress (HCC) 03/24/2020   Nonspecific abnormal electrocardiogram (ECG) (EKG) 02/26/2019   Morbid obesity (HCC) 02/26/2019   Chest discomfort 02/26/2019   Erroneous encounter - disregard 01/25/2019   GERD (gastroesophageal reflux disease) 04/20/2016   Cough 04/20/2016   IFG (impaired fasting glucose) 12/30/2013   Vitamin D  deficiency 12/30/2013   Back muscle spasm 12/15/2013   Anxiety 04/10/2013    Past Surgical History:  Procedure Laterality Date   NO PAST SURGERIES      OB History     Gravida  3   Para   3   Term      Preterm      AB      Living         SAB      IAB      Ectopic      Multiple      Live Births               Home Medications    Prior to Admission medications   Medication Sig Start Date End Date Taking? Authorizing Provider  sucralfate (CARAFATE) 1 g tablet Take 1 tablet (1 g total) by mouth with breakfast, with lunch, and with evening meal. 01/02/24  Yes Vernestine Gondola, PA-C  cetirizine  (ZYRTEC ) 10 MG tablet Take 1 tablet (10 mg total) by mouth daily. 08/05/23   Adolph Hoop, PA-C  hydrOXYzine  (ATARAX ) 25 MG tablet Take 0.5-1 tablets (12.5-25 mg total) by mouth every 8 (eight) hours as needed for itching. 05/27/23   Adolph Hoop, PA-C  omeprazole  (PRILOSEC) 20 MG capsule TAKE 1 CAPSULE(20 MG) BY MOUTH DAILY 12/18/22   Newlin, Enobong, MD  sertraline  (ZOLOFT ) 50 MG tablet TAKE 1 TABLET BY MOUTH EVERY DAY 04/11/23   Hassie Lint, PA-C    Family History Family History  Adopted: Yes  Problem Relation Age of Onset   Diabetes Mother  Hypertension Mother    Heart disease Father    Seizures Father    Seizures Other    Heart failure Other    Diabetes Sister     Social History Social History   Tobacco Use   Smoking status: Never   Smokeless tobacco: Never  Vaping Use   Vaping status: Never Used  Substance Use Topics   Alcohol use: Not Currently   Drug use: No     Allergies   Patient has no known allergies.   Review of Systems Review of Systems  Constitutional:  Negative for chills and fever.  Eyes:  Negative for discharge and redness.  Respiratory:  Negative for shortness of breath.   Cardiovascular:  Negative for chest pain.  Gastrointestinal:  Negative for abdominal pain, nausea and vomiting.  Neurological:  Negative for headaches.     Physical Exam Triage Vital Signs ED Triage Vitals  Encounter Vitals Group     BP      Girls Systolic BP Percentile      Girls Diastolic BP Percentile      Boys Systolic BP Percentile       Boys Diastolic BP Percentile      Pulse      Resp      Temp      Temp src      SpO2      Weight      Height      Head Circumference      Peak Flow      Pain Score      Pain Loc      Pain Education      Exclude from Growth Chart    No data found.  Updated Vital Signs BP 136/88 (BP Location: Right Arm)   Pulse (!) 110   Temp 98.6 F (37 C) (Oral)   Resp 18   LMP 12/24/2023   SpO2 99%   Visual Acuity Right Eye Distance:   Left Eye Distance:   Bilateral Distance:    Right Eye Near:   Left Eye Near:    Bilateral Near:     Physical Exam Vitals and nursing note reviewed.  Constitutional:      General: She is not in acute distress.    Appearance: Normal appearance. She is not ill-appearing.  HENT:     Head: Normocephalic and atraumatic.   Eyes:     Conjunctiva/sclera: Conjunctivae normal.    Cardiovascular:     Rate and Rhythm: Normal rate and regular rhythm.  Pulmonary:     Effort: Pulmonary effort is normal. No respiratory distress.     Breath sounds: Normal breath sounds. No wheezing, rhonchi or rales.  Abdominal:     General: Abdomen is flat. There is no distension.     Palpations: Abdomen is soft.     Tenderness: There is no abdominal tenderness. There is no guarding or rebound.   Neurological:     Mental Status: She is alert.   Psychiatric:        Mood and Affect: Mood normal.        Behavior: Behavior normal.        Thought Content: Thought content normal.      UC Treatments / Results  Labs (all labs ordered are listed, but only abnormal results are displayed) Labs Reviewed - No data to display  EKG   Radiology No results found.  Procedures Procedures (including critical care time)  Medications Ordered in UC Medications - No data  to display  Initial Impression / Assessment and Plan / UC Course  I have reviewed the triage vital signs and the nursing notes.  Pertinent labs & imaging results that were available during my care of the  patient were reviewed by me and considered in my medical decision making (see chart for details).    EKG appears similar to prior, Patient has had significant cardiac workup in the last few months including troponins that were negative. Symptoms do not seem cardiac in nature. Will trial Carafate in place of famotidine given lack of improvement. She will continue omeprazole . She will keep follow up with GI but will follow up sooner with any worsening or new symptoms.   Pressure reported in back of head is an interesting symptom that is seemingly benign. Recommended follow up with PCP regarding same if this continues. There is some suspicion her hairstyle is contributing given volume/length of hair present.  Final Clinical Impressions(s) / UC Diagnoses   Final diagnoses:  Abdominal bloating     Discharge Instructions       Please follow up with your primary care provider in the near future.   Follow up in Emergency Dept with any worsening symptoms.      ED Prescriptions     Medication Sig Dispense Auth. Provider   sucralfate (CARAFATE) 1 g tablet Take 1 tablet (1 g total) by mouth with breakfast, with lunch, and with evening meal. 90 tablet Vernestine Gondola, PA-C      PDMP not reviewed this encounter.   Vernestine Gondola, PA-C 01/02/24 1839

## 2024-01-02 NOTE — Discharge Instructions (Signed)
  Please follow up with your primary care provider in the near future.   Follow up in Emergency Dept with any worsening symptoms.

## 2024-01-03 ENCOUNTER — Telehealth: Payer: Self-pay | Admitting: Family Medicine

## 2024-01-03 NOTE — Telephone Encounter (Signed)
 Contacted pt to see if she needs to keep the 7/23 appt with Dr.Newlin Since she has appt (MyChart) on 6/18 pt states she still wants to keep all her appts.

## 2024-01-08 ENCOUNTER — Telehealth (HOSPITAL_BASED_OUTPATIENT_CLINIC_OR_DEPARTMENT_OTHER): Admitting: Family Medicine

## 2024-01-08 ENCOUNTER — Encounter: Payer: Self-pay | Admitting: Family Medicine

## 2024-01-08 DIAGNOSIS — G5702 Lesion of sciatic nerve, left lower limb: Secondary | ICD-10-CM | POA: Diagnosis not present

## 2024-01-08 DIAGNOSIS — F411 Generalized anxiety disorder: Secondary | ICD-10-CM | POA: Diagnosis not present

## 2024-01-08 DIAGNOSIS — R140 Abdominal distension (gaseous): Secondary | ICD-10-CM

## 2024-01-08 DIAGNOSIS — K219 Gastro-esophageal reflux disease without esophagitis: Secondary | ICD-10-CM | POA: Diagnosis not present

## 2024-01-08 DIAGNOSIS — M545 Low back pain, unspecified: Secondary | ICD-10-CM

## 2024-01-08 MED ORDER — TIZANIDINE HCL 4 MG PO TABS: 4.0000 mg | ORAL_TABLET | Freq: Three times a day (TID) | ORAL | 1 refills | Status: DC | PRN

## 2024-01-08 MED ORDER — SERTRALINE HCL 50 MG PO TABS: ORAL_TABLET | ORAL | 1 refills | Status: DC

## 2024-01-08 NOTE — Patient Instructions (Signed)
 VISIT SUMMARY:  During your visit, we discussed your ongoing gastrointestinal symptoms, including bloating and a menthol feeling in your chest, as well as your concerns about medication management. We also addressed your anxiety and left-sided sciatica.  YOUR PLAN:  -GASTROESOPHAGEAL REFLUX DISEASE (GERD): GERD is a condition where stomach acid frequently flows back into the tube connecting your mouth and stomach, causing symptoms like heartburn and bloating. Continue taking omeprazole  20 mg daily and sucralfate  three times a day with meals. Avoid eating late and stay upright for at least two hours after eating. You will see a GI specialist for further evaluation, which may include an endoscopy.  -PIRIFORMIS SYNDROME AND LEFT SIDED SCIATICA: Piriformis syndrome occurs when the piriformis muscle irritates the sciatic nerve, causing pain and numbness. Continue with physical therapy for management and take tizanidine as needed for muscle relaxation. If symptoms do not improve, we may consider an MRI.  -GENERALIZED ANXIETY DISORDER (GAD): Generalized Anxiety Disorder is characterized by persistent and excessive worry about various aspects of life. You will resume taking sertraline  to help manage your anxiety.  INSTRUCTIONS:  Please make sure to attend your follow-up appointment with the GI specialist on July 23rd.

## 2024-01-08 NOTE — Progress Notes (Signed)
 Virtual Visit via Video Note  I connected with Breanna Thomas, on 01/08/2024 at 1:46 PM by video enabled telemedicine device and verified that I am speaking with the correct person using two identifiers.   Consent: I discussed the limitations, risks, security and privacy concerns of performing an evaluation and management service by telemedicine and the availability of in person appointments. I also discussed with the patient that there may be a patient responsible charge related to this service. The patient expressed understanding and agreed to proceed.   Location of Patient: Home  Location of Provider: Clinic   Persons participating in Telemedicine visit: Jacyln E Gebert Dr. Adan Holms    Discussed the use of AI scribe software for clinical note transcription with the patient, who gave verbal consent to proceed.  History of Present Illness Breanna Thomas is a 43 year old female with a history of GAD, GERD, who presents with ongoing gastrointestinal symptoms and concerns about medication management.  She experiences persistent gastrointestinal symptoms, including a tight feeling in her stomach and bloating, described as a 'bubbly stomach'. She is taking omeprazole  20 mg daily and Pepcid , along with Carafate  prescribed at [facility] prescribed for ulcer prevention, taken three times a day with meals. She is concerned about the number of medications for her stomach issues.  She has a 'menthol feeling' in her chest, which prompted a visit to urgent care. A  chest x-ray were normal. She questions if these symptoms are related to her gastrointestinal issues. A previous Helicobacter pylori test was negative, and she has an upcoming appointment with a GI specialist.  She stopped taking sertraline  for anxiety six months ago and is considering resuming it. She associates her anxiety with her current symptoms and seeks a refill.  She experiences occasional sore taste in her mouth, sometimes  eats late, and has a menthol feeling in her chest. No numbness in hands or legs. Persistent numbness in the left buttock improves with exercise.      Past Medical History:  Diagnosis Date   Anemia    Anxiety    Anxiety    Panic attacks    Vertigo    No Known Allergies  Current Outpatient Medications on File Prior to Visit  Medication Sig Dispense Refill   cetirizine  (ZYRTEC ) 10 MG tablet Take 1 tablet (10 mg total) by mouth daily. 30 tablet 0   hydrOXYzine  (ATARAX ) 25 MG tablet Take 0.5-1 tablets (12.5-25 mg total) by mouth every 8 (eight) hours as needed for itching. 30 tablet 0   omeprazole  (PRILOSEC) 20 MG capsule TAKE 1 CAPSULE(20 MG) BY MOUTH DAILY 90 capsule 2   sucralfate  (CARAFATE ) 1 g tablet Take 1 tablet (1 g total) by mouth with breakfast, with lunch, and with evening meal. 90 tablet 0   No current facility-administered medications on file prior to visit.    ROS: See HPI  Observations/Objective: Awake, alert, oriented x3 Not in acute distress Normal mood      Latest Ref Rng & Units 11/24/2023    2:36 PM 11/07/2023    2:38 PM 09/06/2022    3:51 PM  CMP  Glucose 70 - 99 mg/dL 93  88  90   BUN 6 - 20 mg/dL 9  7  11    Creatinine 0.44 - 1.00 mg/dL 9.62  9.52  8.41   Sodium 135 - 145 mmol/L 138  141  137   Potassium 3.5 - 5.1 mmol/L 4.0  4.4  4.3   Chloride 98 -  111 mmol/L 105  107  103   CO2 22 - 32 mmol/L 20  22  21    Calcium 8.9 - 10.3 mg/dL 9.0  9.2  8.8   Total Protein 6.0 - 8.5 g/dL  6.8    Total Bilirubin 0.0 - 1.2 mg/dL  0.4    Alkaline Phos 44 - 121 IU/L  100    AST 0 - 40 IU/L  20    ALT 0 - 32 IU/L  10      Lipid Panel     Component Value Date/Time   CHOL 174 11/07/2023 1438   TRIG 81 11/07/2023 1438   HDL 40 11/07/2023 1438   CHOLHDL 5.0 (H) 04/06/2021 1209   LDLCALC 119 (H) 11/07/2023 1438   LABVLDL 15 11/07/2023 1438    Lab Results  Component Value Date   HGBA1C 5.2 11/07/2023     Assessment and plan:  Assessment &  Plan Gastroesophageal Reflux Disease (GERD) Abdominal bloating and menthol sensation in chest likely due to GERD. Negative H. pylori test. Menthol sensation may indicate esophagitis or acid reflux. - Continue omeprazole  20 mg daily. - Continue sucralfate  three times a day with meals. - Advise to avoid eating late and remain upright for at least two hours after eating. - Refer to GI specialist for further evaluation, including possible endoscopy.  Piriformis Syndrome and Left Sided Sciatica Diagnosed with piriformis syndrome and left sided sciatica. Left buttock numbness improving with exercise. Ultrasound negative for blood clots. Sciatica managed with physical therapy and pain management. - Refer to physical therapy for management of sciatica and piriformis syndrome. - Prescribe tizanidine as needed for muscle relaxation. - Consider MRI if symptoms do not improve with physical therapy and medication.  Generalized Anxiety Disorder (GAD) Off sertraline  for six months, prefers resuming sertraline  over duloxetine. - Refill sertraline  prescription.  Follow-up Follow-up appointment with GI specialist scheduled for July 23rd. - Ensure attendance at GI appointment on July 23rd.     I discussed the assessment and treatment plan with the patient. The patient was provided an opportunity to ask questions and all were answered. The patient agreed with the plan and demonstrated an understanding of the instructions.   The patient was advised to call back or seek an in-person evaluation if the symptoms worsen or if the condition fails to improve as anticipated.   Meds ordered this encounter  Medications   tiZANidine (ZANAFLEX) 4 MG tablet    Sig: Take 1 tablet (4 mg total) by mouth every 8 (eight) hours as needed.    Dispense:  60 tablet    Refill:  1   sertraline  (ZOLOFT ) 50 MG tablet    Sig: TAKE 1 TABLET BY MOUTH EVERY DAY    Dispense:  90 tablet    Refill:  1    Follow Up  Instructions: Return for previously scheduled appointment.      I provided 19 minutes total of Telehealth time during this encounter including median intraservice time, reviewing previous notes, investigations, ordering medications, medical decision making, coordinating care and patient verbalized understanding at the end of the visit.     Joaquin Mulberry, MD, FAAFP. [facility] [place], [place] [phone]   01/08/2024, 1:46 PM

## 2024-01-14 ENCOUNTER — Other Ambulatory Visit: Payer: Self-pay

## 2024-01-14 ENCOUNTER — Emergency Department (HOSPITAL_COMMUNITY)

## 2024-01-14 ENCOUNTER — Emergency Department (HOSPITAL_COMMUNITY)
Admission: EM | Admit: 2024-01-14 | Discharge: 2024-01-15 | Disposition: A | Discharge status: Home / Self Care | Attending: Emergency Medicine | Admitting: Emergency Medicine

## 2024-01-14 DIAGNOSIS — R0789 Other chest pain: Secondary | ICD-10-CM | POA: Diagnosis not present

## 2024-01-14 DIAGNOSIS — E876 Hypokalemia: Secondary | ICD-10-CM | POA: Diagnosis not present

## 2024-01-14 DIAGNOSIS — R109 Unspecified abdominal pain: Secondary | ICD-10-CM | POA: Diagnosis present

## 2024-01-14 DIAGNOSIS — R6883 Chills (without fever): Secondary | ICD-10-CM | POA: Diagnosis not present

## 2024-01-14 DIAGNOSIS — R1032 Left lower quadrant pain: Secondary | ICD-10-CM | POA: Diagnosis not present

## 2024-01-14 LAB — COMPREHENSIVE METABOLIC PANEL WITH GFR
ALT: 13 U/L (ref 0–44)
AST: 22 U/L (ref 15–41)
Albumin: 3.7 g/dL (ref 3.5–5.0)
Alkaline Phosphatase: 75 U/L (ref 38–126)
Anion gap: 9 (ref 5–15)
BUN: 11 mg/dL (ref 6–20)
CO2: 22 mmol/L (ref 22–32)
Calcium: 8.8 mg/dL — ABNORMAL LOW (ref 8.9–10.3)
Chloride: 105 mmol/L (ref 98–111)
Creatinine, Ser: 0.77 mg/dL (ref 0.44–1.00)
GFR, Estimated: >60 mL/min (ref >60)
Glucose, Bld: 126 mg/dL — ABNORMAL HIGH (ref 70–99)
Potassium: 3.1 mmol/L — ABNORMAL LOW (ref 3.5–5.1)
Sodium: 136 mmol/L (ref 135–145)
Total Bilirubin: 0.4 mg/dL (ref 0.0–1.2)
Total Protein: 7.1 g/dL (ref 6.5–8.1)

## 2024-01-14 LAB — CBC
HCT: 39.1 % (ref 36.0–46.0)
Hemoglobin: 12.1 g/dL (ref 12.0–15.0)
MCH: 28.1 pg (ref 26.0–34.0)
MCHC: 30.9 g/dL (ref 30.0–36.0)
MCV: 90.9 fL (ref 80.0–100.0)
Platelets: 357 10*3/uL (ref 150–400)
RBC: 4.3 MIL/uL (ref 3.87–5.11)
RDW: 12.8 % (ref 11.5–15.5)
WBC: 7.9 10*3/uL (ref 4.0–10.5)
nRBC: 0 % (ref 0.0–0.2)

## 2024-01-14 LAB — URINALYSIS, ROUTINE W REFLEX MICROSCOPIC
Bilirubin Urine: NEGATIVE
Glucose, UA: NEGATIVE mg/dL
Ketones, ur: NEGATIVE mg/dL
Nitrite: NEGATIVE
Protein, ur: 30 mg/dL — AB
RBC / HPF: >50 RBC/hpf (ref 0–5)
Specific Gravity, Urine: 1.021 (ref 1.005–1.030)
pH: 5 (ref 5.0–8.0)

## 2024-01-14 LAB — LIPASE, BLOOD: Lipase: 52 U/L — ABNORMAL HIGH (ref 11–51)

## 2024-01-14 LAB — TROPONIN I (HIGH SENSITIVITY)
Troponin I (High Sensitivity): <2 ng/L (ref <18)
Troponin I (High Sensitivity): 4 ng/L (ref <18)

## 2024-01-14 LAB — HCG, SERUM, QUALITATIVE: Preg, Serum: NEGATIVE

## 2024-01-14 NOTE — ED Provider Notes (Signed)
 [place] EMERGENCY DEPARTMENT AT [facility] Provider Note   CSN: [id number] Arrival date & time: 01/14/24  1841     Patient presents with: Abdominal Pain   Breanna Thomas is a 43 y.o. female.   The history is provided by the patient and medical records.  Abdominal Pain Associated symptoms: chest pain and chills    43 year old female with history of GERD, anxiety, obesity, vitamin D  deficiency, presenting to the ED with 2 weeks of abdominal bloating.  States she relates this to certain foods, has been working on cutting out dairy and other food groups to see if this helps.  PCP is also put her on omeprazole  and Carafate  due to ongoing acid reflux, seems to be helping.  She was referred to GI and has appointment next month.  Also reports ongoing cold sensation in her chest-- states it physically feels cold inside, sometimes feels like water is moving around in the chest.  She denies any productive cough or fever.  Did start having chills yesterday.  She denies any sick contacts.  Prior to Admission medications   Medication Sig Start Date End Date Taking? Authorizing Provider  cetirizine  (ZYRTEC ) 10 MG tablet Take 1 tablet (10 mg total) by mouth daily. 08/05/23   Christopher Savannah, PA-C  hydrOXYzine  (ATARAX ) 25 MG tablet Take 0.5-1 tablets (12.5-25 mg total) by mouth every 8 (eight) hours as needed for itching. 05/27/23   Christopher Savannah, PA-C  omeprazole  (PRILOSEC) 20 MG capsule TAKE 1 CAPSULE(20 MG) BY MOUTH DAILY 12/18/22   Newlin, Enobong, MD  sertraline  (ZOLOFT ) 50 MG tablet TAKE 1 TABLET BY MOUTH EVERY DAY 01/08/24   Newlin, Enobong, MD  sucralfate  (CARAFATE ) 1 g tablet Take 1 tablet (1 g total) by mouth with breakfast, with lunch, and with evening meal. 01/02/24   Billy Asberry FALCON, PA-C  tiZANidine  (ZANAFLEX ) 4 MG tablet Take 1 tablet (4 mg total) by mouth every 8 (eight) hours as needed. 01/08/24   Newlin, Enobong, MD    Allergies: Patient has no known allergies.    Review of  Systems  Constitutional:  Positive for chills.  Cardiovascular:  Positive for chest pain.  Gastrointestinal:  Positive for abdominal pain.  All other systems reviewed and are negative.   Updated Vital Signs BP (!) 145/81   Pulse 92   Temp 98.5 F (36.9 C) (Oral)   Resp 16   Ht 5' 4 (1.626 m)   Wt 127 kg   LMP 01/14/2024   SpO2 100%   BMI 48.06 kg/m   Physical Exam Vitals and nursing note reviewed.  Constitutional:      Appearance: She is well-developed.  HENT:     Head: Normocephalic and atraumatic.   Eyes:     Conjunctiva/sclera: Conjunctivae normal.     Pupils: Pupils are equal, round, and reactive to light.    Cardiovascular:     Rate and Rhythm: Normal rate and regular rhythm.     Heart sounds: Normal heart sounds.  Pulmonary:     Effort: Pulmonary effort is normal.     Breath sounds: Normal breath sounds.  Abdominal:     General: Bowel sounds are normal.     Palpations: Abdomen is soft.     Tenderness: There is no abdominal tenderness. There is no rebound. Negative signs include Murphy's sign.     Comments: Soft, non-tender   Musculoskeletal:        General: Normal range of motion.     Cervical back: Normal  range of motion.   Skin:    General: Skin is warm and dry.   Neurological:     Mental Status: She is alert and oriented to person, place, and time.     (all labs ordered are listed, but only abnormal results are displayed) Labs Reviewed  LIPASE, BLOOD - Abnormal; Notable for the following components:      Result Value   Lipase 52 (*)    All other components within normal limits  COMPREHENSIVE METABOLIC PANEL WITH GFR - Abnormal; Notable for the following components:   Potassium 3.1 (*)    Glucose, Bld 126 (*)    Calcium 8.8 (*)    All other components within normal limits  URINALYSIS, ROUTINE W REFLEX MICROSCOPIC - Abnormal; Notable for the following components:   APPearance HAZY (*)    Hgb urine dipstick LARGE (*)    Protein, ur 30 (*)     Leukocytes,Ua TRACE (*)    Bacteria, UA RARE (*)    All other components within normal limits  RESP PANEL BY RT-PCR (RSV, FLU A&B, COVID)  RVPGX2  CBC  HCG, SERUM, QUALITATIVE  TROPONIN I (HIGH SENSITIVITY)  TROPONIN I (HIGH SENSITIVITY)    EKG: None  Radiology: DG Chest 2 View Result Date: 01/14/2024 CLINICAL DATA:  Chills, chest discomfort EXAM: CHEST - 2 VIEW COMPARISON:  None Available. FINDINGS: The heart size and mediastinal contours are within normal limits. Both lungs are clear. The visualized skeletal structures are unremarkable. IMPRESSION: No active cardiopulmonary disease. Electronically Signed   By: Dorethia Molt M.D.   On: 01/14/2024 23:40     Procedures   Medications Ordered in the ED  potassium chloride  SA (KLOR-CON  M) CR tablet 40 mEq (has no administration in time range)                                    Medical Decision Making Amount and/or Complexity of Data Reviewed Labs: ordered. Radiology: ordered and independent interpretation performed. ECG/medicine tests: ordered and independent interpretation performed.  Risk Prescription drug management.   43 year old female presenting to the ED with multiple concerns--abdominal bloating x 2 weeks, chills, and a cold sensation in her chest.  Has seen PCP about this and has been referred to GI.  Denies any prior cardiac history.  She is afebrile and nontoxic in appearance here.  Her abdomen is soft and nontender.  She has normal bowel sounds.  No peritoneal signs.  There is no distention.  Lungs are clear without any wheezes or rhonchi.  Labs obtained from triage and reviewed--no leukocytosis.  Potassium is mildly low at 3.1, given oral replacement.  Normal lipase.  UA with some blood but is currently on her menstrual cycle.  Troponin negative x 2.  Chest x-ray is clear.  RVP is negative.  Patient's workup today is overall reassuring.  Unclear etiology of the cold sensation in her chest, may be related to her  acid reflux.  Has been improving with omeprazole  and Carafate  from PCP.  Has upcoming appointment with GI, encouraged her to keep that.  Recommended to take note of any foods that seem to make her bloated.  Can follow-up with PCP in the interim.  Return here for any new or acute changes.  Final diagnoses:  Abdominal discomfort  Chest discomfort    ED Discharge Orders     None  Jarold Olam HERO, PA-C 01/15/24 0033    Garrick Charleston, MD 01/19/24 [phone]

## 2024-01-14 NOTE — ED Triage Notes (Signed)
 Pt c/o abdominal on and off for 2 weeks, chills, bloating, and has a cold feeling in her chest.

## 2024-01-15 LAB — RESP PANEL BY RT-PCR (RSV, FLU A&B, COVID)  RVPGX2
Influenza A by PCR: NEGATIVE
Influenza B by PCR: NEGATIVE
Resp Syncytial Virus by PCR: NEGATIVE
SARS Coronavirus 2 by RT PCR: NEGATIVE

## 2024-01-15 MED ORDER — POTASSIUM CHLORIDE CRYS ER 20 MEQ PO TBCR
40.0000 meq | EXTENDED_RELEASE_TABLET | Freq: Once | ORAL | Status: AC
  Administered 2024-01-15: 40 meq via ORAL
  Filled 2024-01-15: qty 2

## 2024-01-15 NOTE — Discharge Instructions (Signed)
 Your workup today was reassuring.  I would continue your medications for acid reflux. Follow-up with GI next month as scheduled.  Would be helpful to note if certain foods seem to make bloating worse. Can follow-up with your primary care doctor in the interim. Return here for new concerns.

## 2024-01-15 NOTE — ED Notes (Signed)
 Patient d/c with home care instructions after medication issues resolved ( medication would not scan due to a different packaging). Charge and pharmacy aware.

## 2024-01-16 ENCOUNTER — Ambulatory Visit
Admission: RE | Admit: 2024-01-16 | Discharge: 2024-01-16 | Disposition: A | Discharge status: Home / Self Care | Payer: Self-pay | Source: Ambulatory Visit | Attending: Family Medicine | Admitting: Family Medicine

## 2024-01-16 ENCOUNTER — Ambulatory Visit (INDEPENDENT_AMBULATORY_CARE_PROVIDER_SITE_OTHER)

## 2024-01-16 ENCOUNTER — Ambulatory Visit: Payer: Self-pay | Admitting: Urgent Care

## 2024-01-16 ENCOUNTER — Telehealth: Payer: Self-pay | Admitting: Family Medicine

## 2024-01-16 VITALS — BP 125/90 | HR 102 | Temp 98.4°F | Resp 16

## 2024-01-16 DIAGNOSIS — M25452 Effusion, left hip: Secondary | ICD-10-CM | POA: Diagnosis not present

## 2024-01-16 DIAGNOSIS — M25552 Pain in left hip: Secondary | ICD-10-CM

## 2024-01-16 MED ORDER — CELECOXIB 200 MG PO CAPS
200.0000 mg | ORAL_CAPSULE | Freq: Two times a day (BID) | ORAL | 0 refills | Status: DC
Start: 2024-01-16 — End: 2024-04-10

## 2024-01-16 NOTE — ED Triage Notes (Signed)
 Pt states her left hip is swollen states she just noticed it yesterday. Pt denies any injury. States she took an Naproxen  for it with no change in her symptoms.

## 2024-01-16 NOTE — ED Provider Notes (Signed)
 [name] - [facility]RG[facility]  Note:  This document was prepared using Conservation officer, historic buildings and may include unintentional dictation errors.  MRN: [id number] DOB: 01/24/1981  Subjective:   Breanna Thomas is a 43 y.o. female presenting for 1 day history of acute onset left hip pain with swelling.  No fall, trauma, rash, numbness or tingling.  Has used naproxen  without relief.  Patient has significant concerns about chest pain, abdominal pain and bloating, chronic low back pain.  Has gotten extensive workup through different practices including an emergency room visit earlier this week.  Her PCP referred her for physical therapy but the patient plans on going to [facility] to see an orthopedist instead of a physical therapist.  Expressed significant concern that she has not gotten an MRI or CT scan.  No current facility-administered medications for this encounter.  Current Outpatient Medications:    cetirizine  (ZYRTEC ) 10 MG tablet, Take 1 tablet (10 mg total) by mouth daily., Disp: 30 tablet, Rfl: 0   hydrOXYzine  (ATARAX ) 25 MG tablet, Take 0.5-1 tablets (12.5-25 mg total) by mouth every 8 (eight) hours as needed for itching., Disp: 30 tablet, Rfl: 0   omeprazole  (PRILOSEC) 20 MG capsule, TAKE 1 CAPSULE(20 MG) BY MOUTH DAILY, Disp: 90 capsule, Rfl: 2   sertraline  (ZOLOFT ) 50 MG tablet, TAKE 1 TABLET BY MOUTH EVERY DAY, Disp: 90 tablet, Rfl: 1   sucralfate  (CARAFATE ) 1 g tablet, Take 1 tablet (1 g total) by mouth with breakfast, with lunch, and with evening meal., Disp: 90 tablet, Rfl: 0   tiZANidine (ZANAFLEX) 4 MG tablet, Take 1 tablet (4 mg total) by mouth every 8 (eight) hours as needed., Disp: 60 tablet, Rfl: 1   No Known Allergies  Past Medical History:  Diagnosis Date   Anemia    Anxiety    Anxiety    Panic attacks    Vertigo      Past Surgical History:  Procedure Laterality Date   NO PAST SURGERIES      Family History  Adopted: Yes  Problem  Relation Age of Onset   Diabetes Mother    Hypertension Mother    Heart disease Father    Seizures Father    Seizures Other    Heart failure Other    Diabetes Sister     Social History   Tobacco Use   Smoking status: Never   Smokeless tobacco: Never  Vaping Use   Vaping status: Never Used  Substance Use Topics   Alcohol use: Not Currently   Drug use: No    ROS   Objective:   Vitals: BP (!) 125/90 (BP Location: Left Arm)   Pulse (!) 102   Temp 98.4 F (36.9 C) (Oral)   Resp 16   LMP 01/14/2024 (Approximate)   SpO2 97%   Physical Exam Constitutional:      General: She is not in acute distress.    Appearance: Normal appearance. She is well-developed. She is not ill-appearing, toxic-appearing or diaphoretic.  HENT:     Head: Normocephalic and atraumatic.     Nose: Nose normal.     Mouth/Throat:     Mouth: Mucous membranes are moist.   Eyes:     General: No scleral icterus.       Right eye: No discharge.        Left eye: No discharge.     Extraocular Movements: Extraocular movements intact.    Cardiovascular:     Rate and Rhythm: Normal  rate.  Pulmonary:     Effort: Pulmonary effort is normal.   Musculoskeletal:     Left hip: Tenderness present. No deformity, lacerations, bony tenderness or crepitus. Normal range of motion. Normal strength.       Legs:   Skin:    General: Skin is warm and dry.   Neurological:     General: No focal deficit present.     Mental Status: She is alert and oriented to person, place, and time.   Psychiatric:        Mood and Affect: Mood normal.        Behavior: Behavior normal.     DG Hip Unilat With Pelvis 2-3 Views Left Result Date: 01/16/2024 CLINICAL DATA:  Left hip pain.  No trauma history is submitted. EXAM: DG HIP (WITH OR WITHOUT PELVIS) 2-3V LEFT COMPARISON:  None Available. FINDINGS: AP view of the pelvis and AP/frog leg views of the left hip. Femoral heads are located. Sacroiliac joints are symmetric. Joint  spaces maintained. No acute fracture. IMPRESSION: No acute osseous abnormality. Electronically Signed   By: Rockey Kilts M.D.   On: 01/16/2024 14:01     Assessment and Plan :   PDMP not reviewed this encounter.  1. Left hip pain   2. Hip swelling, left    X-ray negative, recommended bandaging for left hip bursitis.  Use celecoxib.  I encouraged patient to follow-up with [facility] for her chronic musculoskeletal pains and to pursue MRI and/or CT scanning as appropriate based off of the evaluation by the specialist.  Did also recommend follow-up with her PCP for recheck on her abdominal bloating, chest pains.  Counseled patient on potential for adverse effects with medications prescribed/recommended today, ER and return-to-clinic precautions discussed, patient verbalized understanding.    Christopher Savannah, PA-C 01/16/24 1447

## 2024-01-16 NOTE — Telephone Encounter (Signed)
 Can we change referral to this location as patient requested.  [facility]

## 2024-01-16 NOTE — Telephone Encounter (Signed)
 Copied from CRM [id number]. Topic: Referral - Request for Referral >> Jan 16, 2024 11:28 AM Macario HERO wrote:  Did the patient discuss referral with their provider in the last year? Yes (If No - schedule appointment) (If Yes - send message)  Appointment offered? Yes  Type of order/referral and detailed reason for visit: Left-sided sciatica - Patient stated that there is no doctors on staff and she is requesting a new location.   Preference of office, provider, location: [facility]  If referral order, have you been seen by this specialty before? Yes, last week (If Yes, this issue or another issue? When? Where?  Can we respond through MyChart? No

## 2024-01-17 DIAGNOSIS — M545 Low back pain, unspecified: Secondary | ICD-10-CM | POA: Diagnosis not present

## 2024-01-20 DIAGNOSIS — R1084 Generalized abdominal pain: Secondary | ICD-10-CM | POA: Diagnosis not present

## 2024-01-20 NOTE — Telephone Encounter (Signed)
 Pt has been informed.

## 2024-01-29 ENCOUNTER — Ambulatory Visit

## 2024-02-04 NOTE — Therapy (Incomplete)
 OUTPATIENT PHYSICAL THERAPY THORACOLUMBAR EVALUATION   Patient Name: Breanna Thomas MRN: [id number] DOB:January 18, 1981, 43 y.o., female Today's Date: 02/04/2024  END OF SESSION:   Past Medical History:  Diagnosis Date   Anemia    Anxiety    Anxiety    Panic attacks    Vertigo    Past Surgical History:  Procedure Laterality Date   NO PAST SURGERIES     Patient Active Problem List   Diagnosis Date Noted   Nondisplaced fracture of greater tuberosity of right humerus, initial encounter for closed fracture 02/07/2023   Major depressive disorder, recurrent episode, moderate with anxious distress (HCC) 03/24/2020   Nonspecific abnormal electrocardiogram (ECG) (EKG) 02/26/2019   Morbid obesity (HCC) 02/26/2019   Chest discomfort 02/26/2019   Erroneous encounter - disregard 01/25/2019   GERD (gastroesophageal reflux disease) 04/20/2016   Cough 04/20/2016   IFG (impaired fasting glucose) 12/30/2013   Vitamin D  deficiency 12/30/2013   Back muscle spasm 12/15/2013   Anxiety 04/10/2013    PCP: Delbert, MD  REFERRING PROVIDER: Delbert, MD  REFERRING DIAG: LBP with sciatica  Rationale for Evaluation and Treatment: Rehabilitation  THERAPY DIAG:  No diagnosis found.  ONSET DATE: ***  SUBJECTIVE:                                                                                                                                                                                           SUBJECTIVE STATEMENT: ***  PERTINENT HISTORY:  Anxiety, panic attacks, obesity  PAIN:  Are you having pain? Yes: NPRS scale: *** Pain location: *** Pain description: *** Aggravating factors: *** Relieving factors: ***  PRECAUTIONS: {Therapy precautions:24002}  RED FLAGS: {PT Red Flags:29287}   WEIGHT BEARING RESTRICTIONS: {Yes ***/No:24003}  FALLS:  Has patient fallen in last 6 months? {fallsyesno:27318}  LIVING ENVIRONMENT: Lives with: {[facility] lives with:25569::lives with their  family} Lives in: {Lives in:25570} Stairs: {opstairs:27293} Has following equipment at home: {Assistive devices:23999}  OCCUPATION: ***  PLOF: {PLOF:24004}  PATIENT GOALS: ***  NEXT MD VISIT: ***  OBJECTIVE:  Note: Objective measures were completed at Evaluation unless otherwise noted.  DIAGNOSTIC FINDINGS:  Hip and back x-rays were negative  PATIENT SURVEYS:  {rehab surveys:24030}  COGNITION: Overall cognitive status: {cognition:24006}     SENSATION: {sensation:27233}  MUSCLE LENGTH: Hamstrings: Right *** deg; Left *** deg Debby test: Right *** deg; Left *** deg  POSTURE: {posture:25561}  PALPATION: ***  LUMBAR ROM:   AROM eval  Flexion   Extension   Right lateral flexion   Left lateral flexion   Right rotation   Left rotation    (Blank rows = not tested)  LOWER  EXTREMITY ROM:     Active  Right eval Left eval  Hip flexion    Hip extension    Hip abduction    Hip adduction    Hip internal rotation    Hip external rotation    Knee flexion    Knee extension    Ankle dorsiflexion    Ankle plantarflexion    Ankle inversion    Ankle eversion     (Blank rows = not tested)  LOWER EXTREMITY MMT:    MMT Right eval Left eval  Hip flexion    Hip extension    Hip abduction    Hip adduction    Hip internal rotation    Hip external rotation    Knee flexion    Knee extension    Ankle dorsiflexion    Ankle plantarflexion    Ankle inversion    Ankle eversion     (Blank rows = not tested)  LUMBAR SPECIAL TESTS:  {lumbar special test:25242}  FUNCTIONAL TESTS:  {Functional tests:24029}  GAIT: Distance walked: *** Assistive device utilized: {Assistive devices:23999} Level of assistance: {Levels of assistance:24026} Comments: ***  TREATMENT DATE: ***                                                                                                                                 PATIENT EDUCATION:  Education details: *** Person  educated: {Person educated:25204} Education method: {Education Method:25205} Education comprehension: {Education Comprehension:25206}  HOME EXERCISE PROGRAM: ***  ASSESSMENT:  CLINICAL IMPRESSION: Patient is a 43 y.o. female who was seen today for physical therapy evaluation and treatment for ***.   OBJECTIVE IMPAIRMENTS: Abnormal gait, cardiopulmonary status limiting activity, decreased activity tolerance, decreased balance, decreased coordination, decreased endurance, decreased mobility, difficulty walking, decreased ROM, decreased strength, increased fascial restrictions, increased muscle spasms, impaired flexibility, improper body mechanics, postural dysfunction, and pain.   ACTIVITY LIMITATIONS: {activitylimitations:27494}  PARTICIPATION LIMITATIONS: {participationrestrictions:25113}  PERSONAL FACTORS: {Personal factors:25162} are also affecting patient's functional outcome.   REHAB POTENTIAL: Good  CLINICAL DECISION MAKING: {clinical decision making:25114}  EVALUATION COMPLEXITY: {Evaluation complexity:25115}   GOALS: Goals reviewed with patient? Yes  SHORT TERM GOALS: Target date: 03/07/24  Independent with initial HEP Baseline: Goal status: INITIAL  LONG TERM GOALS: Target date: 05/07/24  Independent with advanced HEP for continued improvement in back pain and overall function Baseline:  Goal status: INITIAL  2.  Understand posture and body mechanics to prevent future or further back pain issues Baseline:  Goal status: INITIAL  3.  *** Baseline:  Goal status: INITIAL  4.  *** Baseline:  Goal status: INITIAL  5.  *** Baseline:  Goal status: INITIAL  6.  *** Baseline:  Goal status: INITIAL  PLAN:  PT FREQUENCY: {rehab frequency:25116}  PT DURATION: {rehab duration:25117}  PLANNED INTERVENTIONS: 97164- PT Re-evaluation, 97110-Therapeutic exercises, 97530- Therapeutic activity, [id number]- Neuromuscular re-education, 97535- Self Care, 02859- Manual  therapy, [id number]- Gait training, H9716- Electrical stimulation (unattended), [id number]-  Traction (mechanical), Patient/Family education, Taping, Joint mobilization, Cryotherapy, and Moist heat.  PLAN FOR NEXT SESSION: PIERRETTE OBADIAH OZELL LELON, PT 02/04/2024, 3:52 PM

## 2024-02-05 ENCOUNTER — Ambulatory Visit: Admitting: Physical Therapy

## 2024-02-12 ENCOUNTER — Ambulatory Visit: Admitting: Family Medicine

## 2024-02-13 ENCOUNTER — Encounter: Payer: Self-pay | Admitting: Gastroenterology

## 2024-02-13 ENCOUNTER — Ambulatory Visit: Admitting: Gastroenterology

## 2024-02-13 VITALS — BP 116/74 | HR 86 | Ht 64.0 in | Wt 283.0 lb

## 2024-02-13 DIAGNOSIS — R0789 Other chest pain: Secondary | ICD-10-CM

## 2024-02-13 DIAGNOSIS — R142 Eructation: Secondary | ICD-10-CM | POA: Diagnosis not present

## 2024-02-13 DIAGNOSIS — K219 Gastro-esophageal reflux disease without esophagitis: Secondary | ICD-10-CM

## 2024-02-13 DIAGNOSIS — R140 Abdominal distension (gaseous): Secondary | ICD-10-CM

## 2024-02-13 MED ORDER — PANTOPRAZOLE SODIUM 40 MG PO TBEC: 40.0000 mg | DELAYED_RELEASE_TABLET | Freq: Two times a day (BID) | ORAL | 2 refills | Status: DC

## 2024-02-13 NOTE — Progress Notes (Unsigned)
 Breanna Thomas [id number] 01/11/1981   Chief Complaint: GERD, abdominal bloating  Referring Provider: Delbert Clam, MD Primary GI MD: Sampson  HPI: Breanna Thomas is a 43 y.o. female with past medical history of anemia, anxiety, panic attacks, vertigo who presents today for a complaint of GERD and abdominal bloating.    Patient has had multiple visits to urgent care and the ED for various symptoms including abdominal discomfort and bloating, and atypical chest pain.  Last ED visit 01/20/2024 for complaint of intermittent chills, fever, and dizziness, as well as diarrhea over the past 2 weeks.  History of gastrointestinal issues for which she was taking omeprazole  and sucralfate .  Reported cold sensation in her chest, had a normal chest x-ray.  Recent lab work normal except slightly low potassium.  She was referred to GI for further evaluation.  Labs 01/14/2024: Normal CBC, potassium 3.1, otherwise normal CMP, mildly elevated lipase of 52, negative pregnancy test, negative troponin, negative respiratory panel  Has had a negative H. pylori breath test 11/11/2023.   Patient states she had been having a strange feeling of coldness in her chest.  Had not noticed any improvement on famotidine  or omeprazole .  Continued to have this strange feeling and abdominal bloating.  After last ED visit had been started on Carafate , and though she had a hard time remembering to take it, it did improve her symptoms.  She has not had sensation of chest coldness since taking this.  Stopped taking about a week ago and has not had recurrence of this symptom.  States she was also prescribed an antibiotic which helped with chills she was having in this cold feeling in her chest.  She denies any fever.  States she did feel hot but every time her temperature was checked it was normal.  She has been having some occasional LUQ abdominal pain as well as some abdominal bloating and increased belching.  She is  still taking omeprazole  but is unsure if it is making any difference.  LUQ abdominal pain is triggered by overeating.  She denies any acid reflux or heartburn, but does note that bubbles come up in her throat and she has some possible regurgitation.  Symptoms are intermittent and do not occur with every meal.  At this time her main concern is bloating.  She has eliminated dairy, onions, fried foods, tomatoes, and all of these dietary changes have been helpful.  States she has a formed bowel movement every 3 days and that this is normal for her.  She is not having to strain and denies hard stools.  In the past, when she drank coffee she would have more frequent bowel movements, but has not been drinking coffee recently.  She denies any blood in her stool or black stools.  Reports that she does not drink very much water at all.  Sometimes goes all day without drinking any water.  She reports history of asthma with associated shortness of breath occasionally, for which she uses an inhaler as needed. Otherwise denies heart or lung problems.  Denies chest pain.    No prior EGD or colonoscopy.  Previous GI Procedures/Imaging   RUQ US  02/24/2019 Study within normal limits.    Past Medical History:  Diagnosis Date   Anemia    Anxiety    Anxiety    Panic attacks    Vertigo     Past Surgical History:  Procedure Laterality Date   NO PAST SURGERIES      Current  Outpatient Medications  Medication Sig Dispense Refill   celecoxib  (CELEBREX ) 200 MG capsule Take 1 capsule (200 mg total) by mouth 2 (two) times daily. 30 capsule 0   cetirizine  (ZYRTEC ) 10 MG tablet Take 1 tablet (10 mg total) by mouth daily. 30 tablet 0   hydrOXYzine  (ATARAX ) 25 MG tablet Take 0.5-1 tablets (12.5-25 mg total) by mouth every 8 (eight) hours as needed for itching. 30 tablet 0   omeprazole  (PRILOSEC) 20 MG capsule TAKE 1 CAPSULE(20 MG) BY MOUTH DAILY 90 capsule 2   sertraline  (ZOLOFT ) 50 MG tablet TAKE 1 TABLET BY  MOUTH EVERY DAY 90 tablet 1   sucralfate  (CARAFATE ) 1 g tablet Take 1 tablet (1 g total) by mouth with breakfast, with lunch, and with evening meal. 90 tablet 0   tiZANidine  (ZANAFLEX ) 4 MG tablet Take 1 tablet (4 mg total) by mouth every 8 (eight) hours as needed. 60 tablet 1   No current facility-administered medications for this visit.    Allergies as of 02/13/2024   (No Known Allergies)    Family History  Adopted: Yes  Problem Relation Age of Onset   Diabetes Mother    Hypertension Mother    Heart disease Father    Seizures Father    Seizures Other    Heart failure Other    Diabetes Sister     Social History   Tobacco Use   Smoking status: Never   Smokeless tobacco: Never  Vaping Use   Vaping status: Never Used  Substance Use Topics   Alcohol use: Not Currently   Drug use: No     Review of Systems:    Constitutional: No unintentional weight loss, fever, chills, weakness or fatigue Skin: No rash or itching Cardiovascular: No chest pain, chest pressure or palpitations   Respiratory: No cough Gastrointestinal: See HPI and otherwise negative Genitourinary: No dysuria or change in urinary frequency Neurological: No headache, dizziness or syncope Musculoskeletal: No new muscle or joint pain Hematologic: No bleeding or bruising    Physical Exam:  Vital signs: BP 116/74 (BP Location: Left Arm, Patient Position: Sitting, Cuff Size: Large)   Pulse 86   Ht 5' 4 (1.626 m)   Wt 283 lb (128.4 kg)   LMP 01/14/2024 (Approximate)   BMI 48.58 kg/m    Constitutional: Pleasant, obese female in NAD, alert and cooperative Head:  Normocephalic and atraumatic.  Eyes: No scleral icterus.  Mouth: No oral lesions. Respiratory: Respirations even and unlabored. Lungs clear to auscultation bilaterally.  No wheezes, crackles, or rhonchi.  Cardiovascular:  Regular rate and rhythm. No murmurs. No peripheral edema. Gastrointestinal:  Soft, nondistended, nontender. No rebound or  guarding. Normal bowel sounds. No appreciable masses or hepatomegaly. Rectal:  Not performed.  Neurologic:  Alert and oriented x4;  grossly normal neurologically.  Skin:   Dry and intact without significant lesions or rashes. Psychiatric: Oriented to person, place and time. Demonstrates good judgement and reason without abnormal affect or behaviors.   RELEVANT LABS AND IMAGING: CBC    Component Value Date/Time   WBC 7.9 01/14/2024 1934   RBC 4.30 01/14/2024 1934   HGB 12.1 01/14/2024 1934   HGB 11.9 11/07/2023 1438   HCT 39.1 01/14/2024 1934   HCT 38.8 11/07/2023 1438   PLT 357 01/14/2024 1934   PLT 341 11/07/2023 1438   MCV 90.9 01/14/2024 1934   MCV 89 11/07/2023 1438   MCH 28.1 01/14/2024 1934   MCHC 30.9 01/14/2024 1934   RDW 12.8 01/14/2024 1934  RDW 11.2 (L) 11/07/2023 1438   LYMPHSABS 2.5 11/24/2023 1436   LYMPHSABS 2.6 11/07/2023 1438   MONOABS 0.6 11/24/2023 1436   EOSABS 0.1 11/24/2023 1436   EOSABS 0.1 11/07/2023 1438   BASOSABS 0.1 11/24/2023 1436   BASOSABS 0.0 11/07/2023 1438    CMP     Component Value Date/Time   NA 136 01/14/2024 1934   NA 141 11/07/2023 1438   K 3.1 (L) 01/14/2024 1934   CL 105 01/14/2024 1934   CO2 22 01/14/2024 1934   GLUCOSE 126 (H) 01/14/2024 1934   BUN 11 01/14/2024 1934   BUN 7 11/07/2023 1438   CREATININE 0.77 01/14/2024 1934   CREATININE 0.68 12/15/2013 1239   CALCIUM 8.8 (L) 01/14/2024 1934   PROT 7.1 01/14/2024 1934   PROT 6.8 11/07/2023 1438   ALBUMIN 3.7 01/14/2024 1934   ALBUMIN 3.9 11/07/2023 1438   AST 22 01/14/2024 1934   ALT 13 01/14/2024 1934   ALKPHOS 75 01/14/2024 1934   BILITOT 0.4 01/14/2024 1934   BILITOT 0.4 11/07/2023 1438   GFRNONAA >60 01/14/2024 1934   GFRNONAA >89 12/15/2013 1239   GFRAA >60 01/26/2019 1158   GFRAA >89 12/15/2013 1239     Assessment/Plan:   GERD Chest discomfort Abdominal bloating Belching Patient with multiple recent ED visits for abdominal discomfort/bloating, and  atypical chest pain.  Reports she had been having a sensation of coldness in her chest.  Did not notice any improvement on famotidine  or omeprazole , but did have improvement in her atypical chest discomfort on Carafate .  She has stopped taking and has not had a recurrence of this symptom.  Has had some reflux/regurgitation as well as upper abdominal pain which is worse with overeating. Having increased belching.  She continues to have intermittent abdominal bloating and discomfort which has been improved by making dietary changes including eliminating dairy, onions, fried foods, and tomatoes.  She has a regular bowel movement every 3 days without straining and denies constipation or diarrhea.  Denies rectal bleeding or melena.  - Stop omeprazole  - Start pantoprazole  40 mg twice daily - If symptoms persist at follow-up, consider EGD for further evaluation - Handout given on low FODMAP diet - Follow up in 6-8 weeks   Camie Furbish, PA-C [facility] 02/13/2024, 1:21 PM  Patient Care Team: Newlin, Enobong, MD as PCP - General (Family Medicine)

## 2024-02-13 NOTE — Patient Instructions (Addendum)
 Stop Omeprazole .   Start Pantoprazole  40 mg twice a day.   _______________________________________________________  If your blood pressure at your visit was 140/90 or greater, please contact your primary care physician to follow up on this.  _______________________________________________________  If you are age 42 or older, your body mass index should be between 23-30. Your Body mass index is 48.58 kg/m. If this is out of the aforementioned range listed, please consider follow up with your Primary Care Provider.  If you are age 69 or younger, your body mass index should be between 19-25. Your Body mass index is 48.58 kg/m. If this is out of the aformentioned range listed, please consider follow up with your Primary Care Provider.   ________________________________________________________  The [facility] providers would like to encourage you to use MYCHART to communicate with providers for non-urgent requests or questions.  Due to long hold times on the telephone, sending your provider a message by [facility] may be a faster and more efficient way to get a response.  Please allow 48 business hours for a response.  Please remember that this is for non-urgent requests.  _______________________________________________________  [name] is using a team-based approach to care.  Your team is made up of your doctor and two to three APPS. Our APPS (Nurse Practitioners and Physician Assistants) work with your physician to ensure care continuity for you. They are fully qualified to address your health concerns and develop a treatment plan. They communicate directly with your gastroenterologist to care for you. Seeing the Advanced Practice Practitioners on your physician's team can help you by facilitating care more promptly, often allowing for earlier appointments, access to diagnostic testing, procedures, and other specialty referrals.

## 2024-02-14 ENCOUNTER — Encounter: Payer: Self-pay | Admitting: Gastroenterology

## 2024-02-19 ENCOUNTER — Ambulatory Visit: Admitting: Family Medicine

## 2024-02-24 ENCOUNTER — Ambulatory Visit: Attending: Family Medicine | Admitting: Physical Therapy

## 2024-02-24 NOTE — Therapy (Deleted)
 OUTPATIENT PHYSICAL THERAPY THORACOLUMBAR EVALUATION   Patient Name: Breanna Thomas MRN: [id number] DOB:November 04, 1980, 43 y.o., female Today's Date: 02/24/2024  END OF SESSION:   Past Medical History:  Diagnosis Date   Anemia    Anxiety    Anxiety    GERD (gastroesophageal reflux disease)    Panic attacks    Vertigo    Past Surgical History:  Procedure Laterality Date   NO PAST SURGERIES     Patient Active Problem List   Diagnosis Date Noted   Nondisplaced fracture of greater tuberosity of right humerus, initial encounter for closed fracture 02/07/2023   Major depressive disorder, recurrent episode, moderate with anxious distress (HCC) 03/24/2020   Nonspecific abnormal electrocardiogram (ECG) (EKG) 02/26/2019   Morbid obesity (HCC) 02/26/2019   Chest discomfort 02/26/2019   Erroneous encounter - disregard 01/25/2019   GERD (gastroesophageal reflux disease) 04/20/2016   Cough 04/20/2016   IFG (impaired fasting glucose) 12/30/2013   Vitamin D  deficiency 12/30/2013   Back muscle spasm 12/15/2013   Anxiety 04/10/2013    PCP: Delbert, MD  REFERRING PROVIDER: Delbert, MD  REFERRING DIAG: LBP with sciatica  Rationale for Evaluation and Treatment: Rehabilitation  THERAPY DIAG:  No diagnosis found.  ONSET DATE: ***  SUBJECTIVE:                                                                                                                                                                                           SUBJECTIVE STATEMENT: ***  PERTINENT HISTORY:  Anxiety, panic attacks, obesity  PAIN:  Are you having pain? Yes: NPRS scale: *** Pain location: *** Pain description: *** Aggravating factors: *** Relieving factors: ***  PRECAUTIONS: {Therapy precautions:24002}  RED FLAGS: {PT Red Flags:29287}   WEIGHT BEARING RESTRICTIONS: {Yes ***/No:24003}  FALLS:  Has patient fallen in last 6 months? {fallsyesno:27318}  LIVING ENVIRONMENT: Lives with: {[facility]  lives with:25569::lives with their family} Lives in: {Lives in:25570} Stairs: {opstairs:27293} Has following equipment at home: {Assistive devices:23999}  OCCUPATION: ***  PLOF: {PLOF:24004}  PATIENT GOALS: ***  NEXT MD VISIT: ***  OBJECTIVE:  Note: Objective measures were completed at Evaluation unless otherwise noted.  DIAGNOSTIC FINDINGS:  Hip and back x-rays were negative  PATIENT SURVEYS:  {rehab surveys:24030}  COGNITION: Overall cognitive status: {cognition:24006}     SENSATION: {sensation:27233}  MUSCLE LENGTH: Hamstrings: Right *** deg; Left *** deg Debby test: Right *** deg; Left *** deg  POSTURE: {posture:25561}  PALPATION: ***  LUMBAR ROM:   AROM eval  Flexion   Extension   Right lateral flexion   Left lateral flexion   Right rotation   Left rotation    (  Blank rows = not tested)  LOWER EXTREMITY ROM:     Active  Right eval Left eval  Hip flexion    Hip extension    Hip abduction    Hip adduction    Hip internal rotation    Hip external rotation    Knee flexion    Knee extension    Ankle dorsiflexion    Ankle plantarflexion    Ankle inversion    Ankle eversion     (Blank rows = not tested)  LOWER EXTREMITY MMT:    MMT Right eval Left eval  Hip flexion    Hip extension    Hip abduction    Hip adduction    Hip internal rotation    Hip external rotation    Knee flexion    Knee extension    Ankle dorsiflexion    Ankle plantarflexion    Ankle inversion    Ankle eversion     (Blank rows = not tested)  LUMBAR SPECIAL TESTS:  {lumbar special test:25242}  FUNCTIONAL TESTS:  {Functional tests:24029}  GAIT: Distance walked: *** Assistive device utilized: {Assistive devices:23999} Level of assistance: {Levels of assistance:24026} Comments: ***  TREATMENT DATE: ***                                                                                                                                 PATIENT EDUCATION:   Education details: *** Person educated: {Person educated:25204} Education method: {Education Method:25205} Education comprehension: {Education Comprehension:25206}  HOME EXERCISE PROGRAM: ***  ASSESSMENT:  CLINICAL IMPRESSION: Patient is a 43 y.o. female who was seen today for physical therapy evaluation and treatment for ***.   OBJECTIVE IMPAIRMENTS: Abnormal gait, cardiopulmonary status limiting activity, decreased activity tolerance, decreased balance, decreased coordination, decreased endurance, decreased mobility, difficulty walking, decreased ROM, decreased strength, increased fascial restrictions, increased muscle spasms, impaired flexibility, improper body mechanics, postural dysfunction, and pain.   ACTIVITY LIMITATIONS: {activitylimitations:27494}  PARTICIPATION LIMITATIONS: {participationrestrictions:25113}  PERSONAL FACTORS: {Personal factors:25162} are also affecting patient's functional outcome.   REHAB POTENTIAL: Good  CLINICAL DECISION MAKING: {clinical decision making:25114}  EVALUATION COMPLEXITY: {Evaluation complexity:25115}   GOALS: Goals reviewed with patient? Yes  SHORT TERM GOALS: Target date: 03/07/24  Independent with initial HEP Baseline: Goal status: INITIAL  LONG TERM GOALS: Target date: 05/07/24  Independent with advanced HEP for continued improvement in back pain and overall function Baseline:  Goal status: INITIAL  2.  Understand posture and body mechanics to prevent future or further back pain issues Baseline:  Goal status: INITIAL  3.  *** Baseline:  Goal status: INITIAL  4.  *** Baseline:  Goal status: INITIAL  5.  *** Baseline:  Goal status: INITIAL  6.  *** Baseline:  Goal status: INITIAL  PLAN:  PT FREQUENCY: {rehab frequency:25116}  PT DURATION: {rehab duration:25117}  PLANNED INTERVENTIONS: 97164- PT Re-evaluation, 97110-Therapeutic exercises, 97530- Therapeutic activity, [id number]- Neuromuscular re-education,  97535- Self Care, 02859- Manual therapy, 02883-  Gait training, [phone]- Electrical stimulation (unattended), [phone]- Traction (mechanical), Patient/Family education, Taping, Joint mobilization, Cryotherapy, and Moist heat.  PLAN FOR NEXT SESSION: PIERRETTE OBADIAH OZELL LELON, PT 02/24/2024, 12:49 PM

## 2024-02-27 ENCOUNTER — Ambulatory Visit
Admission: RE | Admit: 2024-02-27 | Discharge: 2024-02-27 | Disposition: A | Discharge status: Home / Self Care | Source: Ambulatory Visit | Attending: Family Medicine | Admitting: Family Medicine

## 2024-02-27 DIAGNOSIS — Z1231 Encounter for screening mammogram for malignant neoplasm of breast: Secondary | ICD-10-CM | POA: Diagnosis not present

## 2024-02-27 DIAGNOSIS — Z0000 Encounter for general adult medical examination without abnormal findings: Secondary | ICD-10-CM

## 2024-03-02 ENCOUNTER — Ambulatory Visit: Payer: Self-pay | Admitting: Family Medicine

## 2024-03-28 ENCOUNTER — Ambulatory Visit
Admission: RE | Admit: 2024-03-28 | Discharge: 2024-03-28 | Disposition: A | Discharge status: Home / Self Care | Payer: Self-pay | Source: Ambulatory Visit | Attending: Family Medicine | Admitting: Family Medicine

## 2024-03-28 VITALS — BP 136/88 | HR 87 | Temp 98.0°F | Resp 17 | Ht 64.0 in | Wt 280.0 lb

## 2024-03-28 DIAGNOSIS — K120 Recurrent oral aphthae: Secondary | ICD-10-CM | POA: Diagnosis not present

## 2024-03-28 MED ORDER — CHLORHEXIDINE GLUCONATE 0.12 % MT SOLN: 15.0000 mL | Freq: Every day | OROMUCOSAL | 0 refills | Status: AC

## 2024-03-28 NOTE — ED Provider Notes (Signed)
 [facility]    CSN: [id number] Arrival date & time: 03/28/24  1316      History   Chief Complaint Chief Complaint  Patient presents with   Oral Swelling    Roof of mouth closet to throat is swollen. It feels like a lump with slight pain - Entered by patient    HPI Breanna Thomas is a 43 y.o. female presents for mouth sores.  Patient reports 2 to 3 days ago she burned the roof of her mouth on some food.  Since then she has been having some painful ulcerations to the roof of her mouth.  No drainage swelling.  No throat pain.  No OTC treatments have been used since onset.  No other concerns at this time.  HPI  Past Medical History:  Diagnosis Date   Anemia    Anxiety    Anxiety    GERD (gastroesophageal reflux disease)    Panic attacks    Vertigo     Patient Active Problem List   Diagnosis Date Noted   Nondisplaced fracture of greater tuberosity of right humerus, initial encounter for closed fracture 02/07/2023   Major depressive disorder, recurrent episode, moderate with anxious distress (HCC) 03/24/2020   Nonspecific abnormal electrocardiogram (ECG) (EKG) 02/26/2019   Morbid obesity (HCC) 02/26/2019   Chest discomfort 02/26/2019   Erroneous encounter - disregard 01/25/2019   GERD (gastroesophageal reflux disease) 04/20/2016   Cough 04/20/2016   IFG (impaired fasting glucose) 12/30/2013   Vitamin D  deficiency 12/30/2013   Back muscle spasm 12/15/2013   Anxiety 04/10/2013    Past Surgical History:  Procedure Laterality Date   NO PAST SURGERIES      OB History     Gravida  3   Para  3   Term      Preterm      AB      Living         SAB      IAB      Ectopic      Multiple      Live Births               Home Medications    Prior to Admission medications   Medication Sig Start Date End Date Taking? Authorizing Provider  acetaminophen  (TYLENOL ) 500 MG tablet Take 500 mg by mouth every 6 (six) hours as needed for moderate  pain (pain score 4-6). 10/01/19  Yes [provider]  albuterol  (VENTOLIN  HFA) 108 (90 Base) MCG/ACT inhaler Inhale 1 puff into the lungs every 4 (four) hours as needed for wheezing or shortness of breath. 07/27/19  Yes [provider]  celecoxib  (CELEBREX ) 200 MG capsule Take 1 capsule (200 mg total) by mouth 2 (two) times daily. 01/16/24  Yes Christopher Savannah, PA-C  cetirizine  (ZYRTEC ) 10 MG tablet Take 1 tablet (10 mg total) by mouth daily. 08/05/23  Yes Christopher Savannah, PA-C  chlorhexidine  (PERIDEX ) 0.12 % solution Use as directed 15 mLs in the mouth or throat daily for 5 days. 03/28/24 04/02/24 Yes Carinna Newhart, Jodi R, NP  hydrOXYzine  (ATARAX ) 25 MG tablet Take 0.5-1 tablets (12.5-25 mg total) by mouth every 8 (eight) hours as needed for itching. 05/27/23  Yes Christopher Savannah, PA-C  pantoprazole  (PROTONIX ) 40 MG tablet Take 1 tablet (40 mg total) by mouth 2 (two) times daily. 02/13/24  Yes Heinz, Camie FORBES, PA-C  sertraline  (ZOLOFT ) 50 MG tablet TAKE 1 TABLET BY MOUTH EVERY DAY 01/08/24  Yes Newlin, Enobong, MD  sucralfate  (CARAFATE ) 1 g tablet Take 1 tablet (1 g total) by mouth with breakfast, with lunch, and with evening meal. 01/02/24  Yes Billy Asberry FALCON, PA-C    Family History Family History  Adopted: Yes  Problem Relation Age of Onset   Diabetes Mother    Hypertension Mother    Heart disease Father    Seizures Father    Seizures Other    Heart failure Other    Diabetes Sister     Social History Social History   Tobacco Use   Smoking status: Never   Smokeless tobacco: Never  Vaping Use   Vaping status: Never Used  Substance Use Topics   Alcohol use: Not Currently   Drug use: No     Allergies   Patient has no known allergies.   Review of Systems Review of Systems  HENT:  Positive for mouth sores.      Physical Exam Triage Vital Signs ED Triage Vitals  Encounter Vitals Group     BP 03/28/24 1341 136/88     Girls Systolic BP Percentile --      Girls Diastolic BP  Percentile --      Boys Systolic BP Percentile --      Boys Diastolic BP Percentile --      Pulse Rate 03/28/24 1341 87     Resp 03/28/24 1341 17     Temp 03/28/24 1341 98 F (36.7 C)     Temp Source 03/28/24 1341 Oral     SpO2 03/28/24 1341 100 %     Weight 03/28/24 1339 280 lb (127 kg)     Height 03/28/24 1339 5' 4 (1.626 m)     Head Circumference --      Peak Flow --      Pain Score 03/28/24 1339 4     Pain Loc --      Pain Education --      Exclude from Growth Chart --    No data found.  Updated Vital Signs BP 136/88 (BP Location: Right Arm)   Pulse 87   Temp 98 F (36.7 C) (Oral)   Resp 17   Ht 5' 4 (1.626 m)   Wt 280 lb (127 kg)   LMP 03/15/2024   SpO2 100%   BMI 48.06 kg/m   Visual Acuity Right Eye Distance:   Left Eye Distance:   Bilateral Distance:    Right Eye Near:   Left Eye Near:    Bilateral Near:     Physical Exam Vitals and nursing note reviewed.  Constitutional:      General: She is not in acute distress.    Appearance: Normal appearance. She is not ill-appearing.  HENT:     Head: Normocephalic and atraumatic.     Mouth/Throat:     Mouth: Mucous membranes are moist. Oral lesions present.     Pharynx: Oropharynx is clear. Uvula midline. No pharyngeal swelling or uvula swelling.     Tonsils: No tonsillar exudate or tonsillar abscesses.      Comments: Couple canker sores to the mid hard palate.  No swelling drainage. Eyes:     Pupils: Pupils are equal, round, and reactive to light.  Cardiovascular:     Rate and Rhythm: Normal rate.  Pulmonary:     Effort: Pulmonary effort is normal.  Skin:    General: Skin is warm and dry.  Neurological:     General: No focal deficit present.     Mental Status: She  is alert and oriented to person, place, and time.  Psychiatric:        Mood and Affect: Mood normal.        Behavior: Behavior normal.      UC Treatments / Results  Labs (all labs ordered are listed, but only abnormal results are  displayed) Labs Reviewed - No data to display  EKG   Radiology No results found.  Procedures Procedures (including critical care time)  Medications Ordered in UC Medications - No data to display  Initial Impression / Assessment and Plan / UC Course  I have reviewed the triage vital signs and the nursing notes.  Pertinent labs & imaging results that were available during my care of the patient were reviewed by me and considered in my medical decision making (see chart for details).     Reviewed exam and symptoms with patient.  No red flags.  Discussed canker sores secondary to hot food.  Advised these typically resolve on their own and she can do over-the-counter symptomatic treatment.  Patient requested Peridex  in case symptoms do not improve, sent to pharmacy and advised to use OTC treatments for the next couple days if it does not improve she can try Peridex .  PCP or dental follow-up if symptoms do not improve.  ER precautions reviewed. Final Clinical Impressions(s) / UC Diagnoses   Final diagnoses:  Canker sores oral     Discharge Instructions      You may do salt water gargles as well as warm liquids.  Peridex  which is an antibiotic mouth rinse has been provided.  If symptoms do not improve with over-the-counter treatment you may start this daily.  Follow-up with your PCP or your dentist if symptoms do not improve.  Please go to the ER for any worsening symptoms.  Hope you feel better soon!    ED Prescriptions     Medication Sig Dispense Auth. Provider   chlorhexidine  (PERIDEX ) 0.12 % solution Use as directed 15 mLs in the mouth or throat daily for 5 days. 75 mL Latavius Capizzi, Jodi R, NP      PDMP not reviewed this encounter.   Loreda Myla SAUNDERS, NP 03/28/24 [phone]

## 2024-03-28 NOTE — ED Triage Notes (Signed)
 Pt states that she has a lump on the back of her throat. X2-3 days  Pt states that the back of her throat is the only thing that hurts.

## 2024-03-28 NOTE — Discharge Instructions (Addendum)
 You may do salt water gargles as well as warm liquids.  Peridex  which is an antibiotic mouth rinse has been provided.  If symptoms do not improve with over-the-counter treatment you may start this daily.  Follow-up with your PCP or your dentist if symptoms do not improve.  Please go to the ER for any worsening symptoms.  Hope you feel better soon!

## 2024-04-03 DIAGNOSIS — F331 Major depressive disorder, recurrent, moderate: Secondary | ICD-10-CM | POA: Diagnosis not present

## 2024-04-03 DIAGNOSIS — F4311 Post-traumatic stress disorder, acute: Secondary | ICD-10-CM | POA: Diagnosis not present

## 2024-04-03 DIAGNOSIS — F411 Generalized anxiety disorder: Secondary | ICD-10-CM | POA: Diagnosis not present

## 2024-04-03 DIAGNOSIS — F410 Panic disorder [episodic paroxysmal anxiety] without agoraphobia: Secondary | ICD-10-CM | POA: Diagnosis not present

## 2024-04-10 ENCOUNTER — Encounter: Payer: Self-pay | Admitting: Gastroenterology

## 2024-04-10 ENCOUNTER — Other Ambulatory Visit

## 2024-04-10 ENCOUNTER — Ambulatory Visit: Admitting: Gastroenterology

## 2024-04-10 DIAGNOSIS — R1013 Epigastric pain: Secondary | ICD-10-CM

## 2024-04-10 DIAGNOSIS — Z791 Long term (current) use of non-steroidal anti-inflammatories (NSAID): Secondary | ICD-10-CM

## 2024-04-10 DIAGNOSIS — R140 Abdominal distension (gaseous): Secondary | ICD-10-CM

## 2024-04-10 DIAGNOSIS — R070 Pain in throat: Secondary | ICD-10-CM

## 2024-04-10 DIAGNOSIS — K219 Gastro-esophageal reflux disease without esophagitis: Secondary | ICD-10-CM | POA: Diagnosis not present

## 2024-04-10 LAB — CBC WITH DIFFERENTIAL/PLATELET
Basophils Absolute: 0 K/uL (ref 0.0–0.1)
Basophils Relative: 0.5 % (ref 0.0–3.0)
Eosinophils Absolute: 0 K/uL (ref 0.0–0.7)
Eosinophils Relative: 0.3 % (ref 0.0–5.0)
HCT: 37.7 % (ref 36.0–46.0)
Hemoglobin: 12.1 g/dL (ref 12.0–15.0)
Lymphocytes Relative: 38.2 % (ref 12.0–46.0)
Lymphs Abs: 2.8 K/uL (ref 0.7–4.0)
MCHC: 32 g/dL (ref 30.0–36.0)
MCV: 86.8 fl (ref 78.0–100.0)
Monocytes Absolute: 0.5 K/uL (ref 0.1–1.0)
Monocytes Relative: 6.7 % (ref 3.0–12.0)
Neutro Abs: 4.1 K/uL (ref 1.4–7.7)
Neutrophils Relative %: 54.3 % (ref 43.0–77.0)
Platelets: 353 K/uL (ref 150.0–400.0)
RBC: 4.35 Mil/uL (ref 3.87–5.11)
RDW: 12.8 % (ref 11.5–15.5)
WBC: 7.5 K/uL (ref 4.0–10.5)

## 2024-04-10 LAB — COMPREHENSIVE METABOLIC PANEL WITH GFR
ALT: 12 U/L (ref 0–35)
AST: 19 U/L (ref 0–37)
Albumin: 4.1 g/dL (ref 3.5–5.2)
Alkaline Phosphatase: 75 U/L (ref 39–117)
BUN: 10 mg/dL (ref 6–23)
CO2: 28 meq/L (ref 19–32)
Calcium: 9.1 mg/dL (ref 8.4–10.5)
Chloride: 106 meq/L (ref 96–112)
Creatinine, Ser: 0.76 mg/dL (ref 0.40–1.20)
GFR: 96.63 mL/min (ref >60.00)
Glucose, Bld: 91 mg/dL (ref 70–99)
Potassium: 3.7 meq/L (ref 3.5–5.1)
Sodium: 139 meq/L (ref 135–145)
Total Bilirubin: 0.5 mg/dL (ref 0.2–1.2)
Total Protein: 7.3 g/dL (ref 6.0–8.3)

## 2024-04-10 LAB — LIPASE: Lipase: 38 U/L (ref 11.0–59.0)

## 2024-04-10 LAB — TSH: TSH: 2.1 u[IU]/mL (ref 0.35–5.50)

## 2024-04-10 MED ORDER — PANTOPRAZOLE SODIUM 40 MG PO TBEC: 40.0000 mg | DELAYED_RELEASE_TABLET | Freq: Two times a day (BID) | ORAL | 4 refills | Status: DC

## 2024-04-10 NOTE — Progress Notes (Signed)
 Aleisha E Stephenson [id number] [birth year]   Chief Complaint: GERD  Referring Provider: Delbert Clam, MD Primary GI MD: Dr. Shila  HPI: Breanna Thomas is a 43 y.o. female with past medical history of anemia, anxiety, panic attacks, vertigo who presents today for follow up.    Patient has had multiple visits to urgent care and the ED for various symptoms including abdominal discomfort and bloating, and atypical chest pain.   Last ED visit 01/20/2024 for complaint of intermittent chills, fever, and dizziness, as well as diarrhea over the past 2 weeks.  History of gastrointestinal issues for which she was taking omeprazole  and sucralfate .  Reported cold sensation in her chest, had a normal chest x-ray.  Recent lab work normal except slightly low potassium.  She was referred to GI for further evaluation.   Labs 01/14/2024: Normal CBC, potassium 3.1, otherwise normal CMP, mildly elevated lipase of 52, negative pregnancy test, negative troponin, negative respiratory panel   Has had a negative H. pylori breath test 11/11/2023.  At last visit 02/13/2024 patient reported a sensation of coldness in her chest, and did not notice any improvement on famotidine  or omeprazole , but did have improvement on Carafate .  Had stopped taking and had not had any recurrence of chest coldness.  Did endorse some reflux/regurgitation as well as upper abdominal pain which was worse with overeating, as well as increased belching.  Continued to have intermittent abdominal bloating and discomfort which was improved by making dietary changes including eliminating dairy, onions, fried foods, and tomatoes.  Was having regular bowel movements. Switched from omeprazole  to pantoprazole  40 mg twice daily, with consideration for EGD if persistent symptoms at follow-up.  Handout was given on low FODMAP diet.   Patient states she took Protonix  and it helped at first, but is no longer seeing improvement on the medication.   Having issues again with upper abdominal pain, bloating, acid reflux and a bad taste in her mouth taste like blood.  She denies any hematemesis and is not spitting up blood.  Sometimes when she feels bloated she can have a cold/wet feeling in her chest/throat.  Symptoms seem to be worse when her bra is tight across her upper abdomen.  Denies any pain with swallowing but sometimes has an uncomfortable feeling and states it feels weird to drink cold things.  Denies difficulty swallowing.  Upper abdominal pain and bloating seems to be worse if she eats fatty foods or consumes dairy products.  Continues to have regular bowel movements and denies any melena or blood in her stool.  She denies any exertional chest pain or shortness of breath.  Denies fever, sometimes has chills with bloating.  States she has been on NSAIDs for a long time and was taking ibuprofen  daily prior to switching to Celebrex , which she is also taking daily, though she is out of her medication currently.  Denies any history of MI/stroke.    Previous GI Procedures/Imaging   RUQ US  02/24/2019 Study within normal limits.    Past Medical History:  Diagnosis Date   Anemia    Anxiety    Anxiety    GERD (gastroesophageal reflux disease)    Panic attacks    Vertigo     Past Surgical History:  Procedure Laterality Date   NO PAST SURGERIES      Current Outpatient Medications  Medication Sig Dispense Refill   acetaminophen  (TYLENOL ) 500 MG tablet Take 500 mg by mouth every 6 (six) hours as needed for moderate pain (  pain score 4-6).     albuterol  (VENTOLIN  HFA) 108 (90 Base) MCG/ACT inhaler Inhale 1 puff into the lungs every 4 (four) hours as needed for wheezing or shortness of breath.     celecoxib  (CELEBREX ) 200 MG capsule Take 1 capsule (200 mg total) by mouth 2 (two) times daily. 30 capsule 0   cetirizine  (ZYRTEC ) 10 MG tablet Take 1 tablet (10 mg total) by mouth daily. 30 tablet 0   FLUoxetine (PROZAC) 20 MG capsule  Take 20 mg by mouth daily.     hydrOXYzine  (ATARAX ) 25 MG tablet Take 0.5-1 tablets (12.5-25 mg total) by mouth every 8 (eight) hours as needed for itching. 30 tablet 0   pantoprazole  (PROTONIX ) 40 MG tablet Take 1 tablet (40 mg total) by mouth 2 (two) times daily. 60 tablet 2   sucralfate  (CARAFATE ) 1 g tablet Take 1 tablet (1 g total) by mouth with breakfast, with lunch, and with evening meal. (Patient not taking: Reported on 04/10/2024) 90 tablet 0   No current facility-administered medications for this visit.    Allergies as of 04/10/2024   (No Known Allergies)    Family History  Adopted: Yes  Problem Relation Age of Onset   Diabetes Mother    Hypertension Mother    Heart disease Father    Seizures Father    Seizures Other    Heart failure Other    Diabetes Sister     Social History   Tobacco Use   Smoking status: Never   Smokeless tobacco: Never  Vaping Use   Vaping status: Never Used  Substance Use Topics   Alcohol use: Not Currently   Drug use: No     Review of Systems:    Constitutional: No weight loss, fever Cardiovascular: No chest pain Respiratory: No SOB Gastrointestinal: See HPI and otherwise negative Hematologic: No bleeding     Physical Exam:  Vital signs: BP 114/78 (BP Location: Left Arm, Patient Position: Sitting, Cuff Size: Large)   Pulse (!) 105   Ht 5' 4 (1.626 m)   Wt 281 lb 8 oz (127.7 kg)   LMP 03/15/2024   BMI 48.32 kg/m   Wt Readings from Last 3 Encounters:  04/10/24 281 lb 8 oz (127.7 kg)  03/28/24 280 lb (127 kg)  02/13/24 283 lb (128.4 kg)   Constitutional: Pleasant, morbidly obese female in NAD, alert and cooperative Head:  Normocephalic and atraumatic.  Eyes: No scleral icterus.  Respiratory: Respirations even and unlabored. Lungs clear to auscultation bilaterally.  No wheezes, crackles, or rhonchi.  Cardiovascular:  Regular rate and rhythm. No murmurs. No peripheral edema. Gastrointestinal:  Soft, obese, nontender. No  rebound or guarding. Normal bowel sounds. No appreciable masses or hepatomegaly. Rectal:  Not performed.  Neurologic:  Alert and oriented x4;  grossly normal neurologically.  Skin:   Dry and intact without significant lesions or rashes. Psychiatric: Oriented to person, place and time. Demonstrates good judgement and reason without abnormal affect or behaviors.   RELEVANT LABS AND IMAGING: CBC    Component Value Date/Time   WBC 7.9 01/14/2024 1934   RBC 4.30 01/14/2024 1934   HGB 12.1 01/14/2024 1934   HGB 11.9 11/07/2023 1438   HCT 39.1 01/14/2024 1934   HCT 38.8 11/07/2023 1438   PLT 357 01/14/2024 1934   PLT 341 11/07/2023 1438   MCV 90.9 01/14/2024 1934   MCV 89 11/07/2023 1438   MCH 28.1 01/14/2024 1934   MCHC 30.9 01/14/2024 1934   RDW 12.8 01/14/2024  1934   RDW 11.2 (L) 11/07/2023 1438   LYMPHSABS 2.5 11/24/2023 1436   LYMPHSABS 2.6 11/07/2023 1438   MONOABS 0.6 11/24/2023 1436   EOSABS 0.1 11/24/2023 1436   EOSABS 0.1 11/07/2023 1438   BASOSABS 0.1 11/24/2023 1436   BASOSABS 0.0 11/07/2023 1438    CMP     Component Value Date/Time   NA 136 01/14/2024 1934   NA 141 11/07/2023 1438   K 3.1 (L) 01/14/2024 1934   CL 105 01/14/2024 1934   CO2 22 01/14/2024 1934   GLUCOSE 126 (H) 01/14/2024 1934   BUN 11 01/14/2024 1934   BUN 7 11/07/2023 1438   CREATININE 0.77 01/14/2024 1934   CREATININE 0.68 12/15/2013 1239   CALCIUM 8.8 (L) 01/14/2024 1934   PROT 7.1 01/14/2024 1934   PROT 6.8 11/07/2023 1438   ALBUMIN 3.7 01/14/2024 1934   ALBUMIN 3.9 11/07/2023 1438   AST 22 01/14/2024 1934   ALT 13 01/14/2024 1934   ALKPHOS 75 01/14/2024 1934   BILITOT 0.4 01/14/2024 1934   BILITOT 0.4 11/07/2023 1438   GFRNONAA >60 01/14/2024 1934   GFRNONAA >89 12/15/2013 1239   GFRAA >60 01/26/2019 1158   GFRAA >89 12/15/2013 1239     Assessment/Plan:   Epigastric abdominal pain Abdominal bloating GERD Throat discomfort NSAID use Patient seen today for follow-up.   Despite taking Protonix  twice daily she continues to have upper abdominal pain, bloating, acid reflux, and a bad taste in her mouth which tastes like blood, though she is not seeing any blood.  Has a strange sensation in her chest and throat with bloating which feels like a cold/wet sensation.  Initially had some improvement on Protonix  but now is no longer helping.  She does have significant history of NSAID use and was previously taking ibuprofen  daily before starting on Celebrex  daily.  Denies any melena or hematemesis/coffee-ground emesis. Certain foods exacerbate her upper abdominal pain such as fatty foods and dairy products.  I recommended EGD for further evaluation due to concern for possibility of peptic ulcer with her history of NSAID use.  She does have some concerns about going under sedation.  Discussed ordering imaging for further evaluation, though did advise that ulcers may not be seen on imaging.  She is agreeable to tentatively schedule EGD, with UGI series and CT A/P prior.  - Tentatively schedule EGD. I thoroughly discussed the procedure with the patient to include nature of the procedure, alternatives, benefits, and risks (including but not limited to bleeding, infection, perforation, anesthesia/cardiac/pulmonary complications). Patient verbalized understanding and gave verbal consent to proceed with procedure.  - Order UGI series for further evaluation of the esophagus - Order CT A/P for evaluation of epigastric pain, bloating - Labs today: CBC, CMP, TSH, lipase, TTG, IgA - Continue Protonix  40 mg twice daily.  Will send refill. - Stop NSAIDs - ED precautions given   Camie Furbish, PA-C [facility] 04/10/2024, 1:37 PM  Patient Care Team: Newlin, Enobong, MD as PCP - General (Family Medicine)

## 2024-04-10 NOTE — Patient Instructions (Signed)
 _______________________________________________________  If your blood pressure at your visit was 140/90 or greater, please contact your primary care physician to follow up on this.  _______________________________________________________  If you are age 42 or older, your body mass index should be between 23-30. Your Body mass index is 48.32 kg/m. If this is out of the aforementioned range listed, please consider follow up with your Primary Care Provider.  If you are age 27 or younger, your body mass index should be between 19-25. Your Body mass index is 48.32 kg/m. If this is out of the aformentioned range listed, please consider follow up with your Primary Care Provider.   ________________________________________________________  The [facility] providers would like to encourage you to use MYCHART to communicate with providers for non-urgent requests or questions.  Due to long hold times on the telephone, sending your provider a message by [facility] may be a faster and more efficient way to get a response.  Please allow 48 business hours for a response.  Please remember that this is for non-urgent requests.  _______________________________________________________  [facility] is using a team-based approach to care.  Your team is made up of your doctor and two to three APPS. Our APPS (Nurse Practitioners and Physician Assistants) work with your physician to ensure care continuity for you. They are fully qualified to address your health concerns and develop a treatment plan. They communicate directly with your gastroenterologist to care for you. Seeing the Advanced Practice Practitioners on your physician's team can help you by facilitating care more promptly, often allowing for earlier appointments, access to diagnostic testing, procedures, and other specialty referrals.   Your provider has requested that you go to the basement level for lab work before leaving today. Press B on the  elevator. The lab is located at the first door on the left as you exit the elevator.  We have sent the following medications to your pharmacy for you to pick up at your convenience:  CONTINUE: pantoprazole  40 mg one tablet twice daily  DISCONTINUE: Celebrex  at this time.  You have been scheduled for a CT scan of the abdomen and pelvis at [facility], 1st floor Radiology. You are scheduled on 04-17-24 at 2:30pm. You should arrive 15 minutes prior to your appointment time for registration.    Please follow the written instructions below on the day of your exam:   1) Do not eat anything after 10:30am (4 hours prior to your test)   The purpose of you drinking the oral contrast is to aid in the visualization of your intestinal tract. The contrast solution may cause some diarrhea. Depending on your individual set of symptoms, you may also receive an intravenous injection of x-ray contrast/dye. Plan on being at [facility] for 45 minutes or longer, depending on the type of exam you are having performed.   If you have any questions regarding your exam or if you need to reschedule, you may call [facility] at [phone] between the hours of 8:00 am and 5:00 pm, Monday-Friday.   You have been scheduled for an Upper GI Series at [facility]. Your appointment is on 04-24-24 at 10am. Please arrive 30 minutes prior to your test for registration. Make sure not to eat or drink anything after midnight on the night before your test. If you need to reschedule, please call radiology at [phone]. ________________________________________________________________ An upper GI series uses x rays to help diagnose problems of the upper GI tract, which includes the esophagus, stomach,  and duodenum. The duodenum is the first part of the small intestine.  An upper GI series is conducted by a radiology technologist or a radiologist--a doctor who specializes in x-ray imaging--at a hospital or  outpatient center.  While sitting or standing in front of an x-ray machine, the patient drinks barium liquid, which is often white and has a chalky consistency and taste. The barium liquid coats the lining of the upper GI tract and makes signs of disease show up more clearly on x rays. X-ray video, called fluoroscopy, is used to view the barium liquid moving through the esophagus, stomach, and duodenum.  Additional x rays and fluoroscopy are performed while the patient lies on an x-ray table. To fully coat the upper GI tract with barium liquid, the technologist or radiologist may press on the abdomen or ask the patient to change position. Patients hold still in various positions, allowing the technologist or radiologist to take x rays of the upper GI tract at different angles. If a technologist conducts the upper GI series, a radiologist will later examine the images to look for problems.  This test typically takes about 1 hour to complete. __________________________________________________________________  Breanna Thomas have been scheduled for an endoscopy. Please follow written instructions given to you at your visit today.  If you use inhalers (even only as needed), please bring them with you on the day of your procedure.  If you take any of the following medications, they will need to be adjusted prior to your procedure:   DO NOT TAKE 7 DAYS PRIOR TO TEST- Trulicity (dulaglutide) Ozempic, Wegovy (semaglutide) Mounjaro (tirzepatide) Bydureon Bcise (exanatide extended release)  DO NOT TAKE 1 DAY PRIOR TO YOUR TEST Rybelsus (semaglutide) Adlyxin (lixisenatide) Victoza (liraglutide) Byetta (exanatide) ___________________________________________________________________________  Due to recent changes in healthcare laws, you may see the results of your imaging and laboratory studies on MyChart before your provider has had a chance to review them.  We understand that in some cases there may be results  that are confusing or concerning to you. Not all laboratory results come back in the same time frame and the provider may be waiting for multiple results in order to interpret others.  Please give us  48 hours in order for your provider to thoroughly review all the results before contacting the office for clarification of your results.   Thank you for entrusting me with your care and choosing [facility].  Camie Furbish, PA-C

## 2024-04-11 LAB — IGA: Immunoglobulin A: 116 mg/dL (ref 47–310)

## 2024-04-11 LAB — TISSUE TRANSGLUTAMINASE, IGA: (tTG) Ab, IgA: <1 U/mL

## 2024-04-12 ENCOUNTER — Ambulatory Visit
Admission: EM | Admit: 2024-04-12 | Discharge: 2024-04-12 | Disposition: A | Discharge status: Home / Self Care | Attending: Family Medicine | Admitting: Family Medicine

## 2024-04-12 DIAGNOSIS — K047 Periapical abscess without sinus: Secondary | ICD-10-CM | POA: Diagnosis not present

## 2024-04-12 MED ORDER — AMOXICILLIN-POT CLAVULANATE 875-125 MG PO TABS: 1.0000 | ORAL_TABLET | Freq: Two times a day (BID) | ORAL | 0 refills | Status: DC

## 2024-04-12 NOTE — ED Triage Notes (Addendum)
 Pt present with c/o oral pain and states she also has dental pain. C/o white bumps on her tongue.  Pt states she has only taken tylenol  for pain relief.

## 2024-04-12 NOTE — Discharge Instructions (Addendum)
 Start Augmentin  twice daily for 7 days.  Please follow-up with your dentist ASAP for further treatment as antibiotics is only temporary treatment.  Continue Tylenol  or ibuprofen  as needed.  Please go to the ER for any worsening symptoms that occur prior to seeing your dentist.  I hope you feel better soon!

## 2024-04-12 NOTE — ED Provider Notes (Signed)
 [facility]    CSN: [id number] Arrival date & time: 04/12/24  1317      History   Chief Complaint Chief Complaint  Patient presents with   Dental Pain    HPI Breanna Thomas is a 43 y.o. female presents for dental pain.  Patient presents with 2 to 3 days of a right lower dental pain with reported tactile fevers.  Dates she feels like her face is swollen.  Does have a history of dental infections in the past.  No injury to the tooth.  She does have a dentist but has not followed up with them.  She has taken Tylenol  that does help.  In addition she states there are some white bumps on her tongue but denies any pain, injury or white blotches on the inside of the lips or mouth.  No other concerns at this time   Dental Pain   Past Medical History:  Diagnosis Date   Anemia    Anxiety    Anxiety    GERD (gastroesophageal reflux disease)    Panic attacks    Vertigo     Patient Active Problem List   Diagnosis Date Noted   Nondisplaced fracture of greater tuberosity of right humerus, initial encounter for closed fracture 02/07/2023   Major depressive disorder, recurrent episode, moderate with anxious distress (HCC) 03/24/2020   Nonspecific abnormal electrocardiogram (ECG) (EKG) 02/26/2019   Morbid obesity (HCC) 02/26/2019   Chest discomfort 02/26/2019   Erroneous encounter - disregard 01/25/2019   GERD (gastroesophageal reflux disease) 04/20/2016   Cough 04/20/2016   IFG (impaired fasting glucose) 12/30/2013   Vitamin D  deficiency 12/30/2013   Back muscle spasm 12/15/2013   Anxiety 04/10/2013    Past Surgical History:  Procedure Laterality Date   NO PAST SURGERIES      OB History     Gravida  3   Para  3   Term      Preterm      AB      Living         SAB      IAB      Ectopic      Multiple      Live Births               Home Medications    Prior to Admission medications   Medication Sig Start Date End Date Taking?  Authorizing Provider  amoxicillin -clavulanate (AUGMENTIN ) 875-125 MG tablet Take 1 tablet by mouth every 12 (twelve) hours. 04/12/24  Yes Branae Crail, Jodi R, NP  acetaminophen  (TYLENOL ) 500 MG tablet Take 500 mg by mouth every 6 (six) hours as needed for moderate pain (pain score 4-6). 10/01/19   [provider]  albuterol  (VENTOLIN  HFA) 108 (90 Base) MCG/ACT inhaler Inhale 1 puff into the lungs every 4 (four) hours as needed for wheezing or shortness of breath. 07/27/19   [provider]  cetirizine  (ZYRTEC ) 10 MG tablet Take 1 tablet (10 mg total) by mouth daily. 08/05/23   Christopher Savannah, PA-C  FLUoxetine (PROZAC) 20 MG capsule Take 20 mg by mouth daily. 04/03/24   [provider]  hydrOXYzine  (ATARAX ) 25 MG tablet Take 0.5-1 tablets (12.5-25 mg total) by mouth every 8 (eight) hours as needed for itching. 05/27/23   Christopher Savannah, PA-C  pantoprazole  (PROTONIX ) 40 MG tablet Take 1 tablet (40 mg total) by mouth 2 (two) times daily. 04/10/24   Heinz, Camie FORBES, PA-C  sucralfate  (CARAFATE ) 1 g tablet Take  1 tablet (1 g total) by mouth with breakfast, with lunch, and with evening meal. Patient not taking: Reported on 04/10/2024 01/02/24   Billy Asberry FALCON, PA-C    Family History Family History  Adopted: Yes  Problem Relation Age of Onset   Diabetes Mother    Hypertension Mother    Heart disease Father    Seizures Father    Seizures Other    Heart failure Other    Diabetes Sister     Social History Social History   Tobacco Use   Smoking status: Never   Smokeless tobacco: Never  Vaping Use   Vaping status: Never Used  Substance Use Topics   Alcohol use: Not Currently   Drug use: No     Allergies   Patient has no known allergies.   Review of Systems Review of Systems  HENT:  Positive for dental problem.      Physical Exam Triage Vital Signs ED Triage Vitals  Encounter Vitals Group     BP 04/12/24 1342 125/82     Girls Systolic BP Percentile --      Girls  Diastolic BP Percentile --      Boys Systolic BP Percentile --      Boys Diastolic BP Percentile --      Pulse Rate 04/12/24 1342 86     Resp 04/12/24 1342 16     Temp 04/12/24 1342 98.2 F (36.8 C)     Temp Source 04/12/24 1342 Oral     SpO2 04/12/24 1341 98 %     Weight --      Height --      Head Circumference --      Peak Flow --      Pain Score --      Pain Loc --      Pain Education --      Exclude from Growth Chart --    No data found.  Updated Vital Signs BP 125/82 (BP Location: Left Arm)   Pulse 86   Temp 98.2 F (36.8 C) (Oral)   Resp 16   LMP 03/15/2024 (Exact Date)   SpO2 98%   Visual Acuity Right Eye Distance:   Left Eye Distance:   Bilateral Distance:    Right Eye Near:   Left Eye Near:    Bilateral Near:     Physical Exam Vitals and nursing note reviewed.  Constitutional:      General: She is not in acute distress.    Appearance: Normal appearance. She is not ill-appearing.  HENT:     Head: Normocephalic and atraumatic.     Mouth/Throat:     Mouth: No oral lesions.     Dentition: Dental tenderness present.     Pharynx: Oropharynx is clear. Uvula midline. No uvula swelling.      Comments: There is tenderness to palpation along the gumline for tooth #29.  No visible or palpable abscess.  No facial swelling.  There is a faint white film over the tongue that does not come off with scraping.  There is no white patches or lesions on the inside of the lips or cheeks.  No tongue swelling Eyes:     Pupils: Pupils are equal, round, and reactive to light.  Cardiovascular:     Rate and Rhythm: Normal rate.  Pulmonary:     Effort: Pulmonary effort is normal.  Skin:    General: Skin is warm and dry.  Neurological:     General: No  focal deficit present.     Mental Status: She is alert and oriented to person, place, and time.  Psychiatric:        Mood and Affect: Mood normal.        Behavior: Behavior normal.      UC Treatments / Results   Labs (all labs ordered are listed, but only abnormal results are displayed) Labs Reviewed - No data to display  EKG   Radiology No results found.  Procedures Procedures (including critical care time)  Medications Ordered in UC Medications - No data to display  Initial Impression / Assessment and Plan / UC Course  I have reviewed the triage vital signs and the nursing notes.  Pertinent labs & imaging results that were available during my care of the patient were reviewed by me and considered in my medical decision making (see chart for details).     Reviewed exam and symptoms with patient.  No red flags.  Will start Augmentin  for dental infection.  Instructed to contact her dentist ASAP for further treatment.  Discussed film on tongue not consistent with thrush.  Advised to monitor, brush her tongue and use mouthwash and follow-up with her dentist if it does not improve.  ER precautions reviewed. Final Clinical Impressions(s) / UC Diagnoses   Final diagnoses:  Dental infection     Discharge Instructions      Start Augmentin  twice daily for 7 days.  Please follow-up with your dentist ASAP for further treatment as antibiotics is only temporary treatment.  Continue Tylenol  or ibuprofen  as needed.  Please go to the ER for any worsening symptoms that occur prior to seeing your dentist.  I hope you feel better soon!     ED Prescriptions     Medication Sig Dispense Auth. Provider   amoxicillin -clavulanate (AUGMENTIN ) 875-125 MG tablet Take 1 tablet by mouth every 12 (twelve) hours. 14 tablet Ridley Dileo, Jodi R, NP      PDMP not reviewed this encounter.   Loreda Myla SAUNDERS, NP 04/12/24 1447

## 2024-04-13 ENCOUNTER — Emergency Department (HOSPITAL_BASED_OUTPATIENT_CLINIC_OR_DEPARTMENT_OTHER)
Admission: EM | Admit: 2024-04-13 | Discharge: 2024-04-13 | Disposition: A | Discharge status: Home / Self Care | Attending: Emergency Medicine | Admitting: Emergency Medicine

## 2024-04-13 ENCOUNTER — Ambulatory Visit: Payer: Self-pay | Admitting: Gastroenterology

## 2024-04-13 ENCOUNTER — Encounter (HOSPITAL_BASED_OUTPATIENT_CLINIC_OR_DEPARTMENT_OTHER): Payer: Self-pay | Admitting: Emergency Medicine

## 2024-04-13 ENCOUNTER — Other Ambulatory Visit: Payer: Self-pay

## 2024-04-13 DIAGNOSIS — K029 Dental caries, unspecified: Secondary | ICD-10-CM | POA: Insufficient documentation

## 2024-04-13 DIAGNOSIS — K0889 Other specified disorders of teeth and supporting structures: Secondary | ICD-10-CM | POA: Diagnosis not present

## 2024-04-13 MED ORDER — LIDOCAINE VISCOUS HCL 2 % MT SOLN
15.0000 mL | Freq: Once | OROMUCOSAL | Status: AC
  Administered 2024-04-13: 15 mL via OROMUCOSAL
  Filled 2024-04-13: qty 15

## 2024-04-13 MED ORDER — LIDOCAINE VISCOUS HCL 2 % MT SOLN: 5.0000 mL | Freq: Three times a day (TID) | OROMUCOSAL | 0 refills | Status: DC | PRN

## 2024-04-13 NOTE — ED Provider Notes (Signed)
 [facility] Provider Note   CSN: [id number] Arrival date & time: 04/13/24  0456     Patient presents with: Dental Pain   Breanna Thomas is a 43 y.o. female.   The history is provided by the patient.  Dental Pain Location:  Upper Upper teeth location:  3/RU 1st molar, 4/RU 2nd bicuspid and 5/RU 1st bicuspid Quality:  Aching Severity:  Severe Onset quality:  Gradual Duration:  3 days Timing:  Constant Progression:  Worsening Chronicity:  New Context: dental caries and poor dentition   Previous work-up:  Dental exam Relieved by:  Nothing Worsened by:  Nothing      Prior to Admission medications   Medication Sig Start Date End Date Taking? Authorizing Provider  magic mouthwash (lidocaine , diphenhydrAMINE , alum & mag hydroxide) suspension Swish and spit 5 mLs 3 (three) times daily as needed for mouth pain. 04/13/24  Yes Zaide Mcclenahan, MD  acetaminophen  (TYLENOL ) 500 MG tablet Take 500 mg by mouth every 6 (six) hours as needed for moderate pain (pain score 4-6). 10/01/19   [provider]  albuterol  (VENTOLIN  HFA) 108 (90 Base) MCG/ACT inhaler Inhale 1 puff into the lungs every 4 (four) hours as needed for wheezing or shortness of breath. 07/27/19   [provider]  amoxicillin -clavulanate (AUGMENTIN ) 875-125 MG tablet Take 1 tablet by mouth every 12 (twelve) hours. 04/12/24   Mayer, Jodi R, NP  cetirizine  (ZYRTEC ) 10 MG tablet Take 1 tablet (10 mg total) by mouth daily. 08/05/23   Christopher Savannah, PA-C  FLUoxetine (PROZAC) 20 MG capsule Take 20 mg by mouth daily. 04/03/24   [provider]  hydrOXYzine  (ATARAX ) 25 MG tablet Take 0.5-1 tablets (12.5-25 mg total) by mouth every 8 (eight) hours as needed for itching. 05/27/23   Christopher Savannah, PA-C  pantoprazole  (PROTONIX ) 40 MG tablet Take 1 tablet (40 mg total) by mouth 2 (two) times daily. 04/10/24   Heinz, Sara E, PA-C  sucralfate  (CARAFATE ) 1 g tablet Take 1 tablet  (1 g total) by mouth with breakfast, with lunch, and with evening meal. Patient not taking: Reported on 04/10/2024 01/02/24   Billy Asberry FALCON, PA-C    Allergies: Nsaids    Review of Systems  Updated Vital Signs BP 131/86 (BP Location: Right Arm)   Pulse 91   Temp 98.4 F (36.9 C)   Resp 18   Ht 5' 4 (1.626 m)   Wt 127 kg   LMP 03/15/2024 (Exact Date)   SpO2 99%   BMI 48.06 kg/m   Physical Exam Vitals and nursing note reviewed.  Constitutional:      General: She is not in acute distress.    Appearance: Normal appearance. She is well-developed.  HENT:     Head: Normocephalic and atraumatic.     Nose: Nose normal.  Eyes:     Pupils: Pupils are equal, round, and reactive to light.  Cardiovascular:     Rate and Rhythm: Normal rate and regular rhythm.     Pulses: Normal pulses.     Heart sounds: Normal heart sounds.  Pulmonary:     Effort: Pulmonary effort is normal. No respiratory distress.     Breath sounds: Normal breath sounds.  Abdominal:     General: Bowel sounds are normal. There is no distension.     Palpations: Abdomen is soft.     Tenderness: There is no abdominal tenderness. There is no guarding or rebound.  Genitourinary:    Vagina:  Vaginal discharge present.  Musculoskeletal:        General: Normal range of motion.     Cervical back: Neck supple.  Skin:    General: Skin is dry.     Capillary Refill: Capillary refill takes less than 2 seconds.     Findings: No erythema or rash.  Neurological:     General: No focal deficit present.     Mental Status: She is alert.     Deep Tendon Reflexes: Reflexes normal.  Psychiatric:        Mood and Affect: Mood normal.     (all labs ordered are listed, but only abnormal results are displayed) Labs Reviewed - No data to display  EKG: None  Radiology: No results found.   Procedures   Medications Ordered in the ED  lidocaine  (XYLOCAINE ) 2 % viscous mouth solution 15 mL (15 mLs Mouth/Throat Given 04/13/24  0534)                                    Medical Decision Making Dental pain on antibiotics but can't take NSAIDs and tylenol  not working   Amount and/or Complexity of Data Reviewed External Data Reviewed: notes.    Details: Previous notes reviewed   Risk Prescription drug management. Risk Details: Dental caries.  Continue antibiotics.  Magic mouthwash ordered.  Call Dentistry to be seen.       Final diagnoses:  Dental caries  Pain due to dental caries   No signs of systemic illness or infection. The patient is nontoxic-appearing on exam and vital signs are within normal limits.  I have reviewed the triage vital signs and the nursing notes. Pertinent labs & imaging results that were available during my care of the patient were reviewed by me and considered in my medical decision making (see chart for details). After history, exam, and medical workup I feel the patient has been appropriately medically screened and is safe for discharge home. Pertinent diagnoses were discussed with the patient. Patient was given return precautions.  ED Discharge Orders          Ordered    magic mouthwash (lidocaine , diphenhydrAMINE , alum & mag hydroxide) suspension  3 times daily PRN        04/13/24 0532               Almus Woodham, MD 04/13/24 9358

## 2024-04-13 NOTE — ED Triage Notes (Signed)
 Presents with dental pain x 2-3 days. States she was seen at [facility] and given abx, which she is taking but states pain is intolerable. She reports taking 4 tylenol  today, [place], and using ice without relief. Pain is on lower R side.

## 2024-04-15 DIAGNOSIS — M7062 Trochanteric bursitis, left hip: Secondary | ICD-10-CM | POA: Diagnosis not present

## 2024-04-17 ENCOUNTER — Ambulatory Visit (HOSPITAL_COMMUNITY)
Admission: RE | Admit: 2024-04-17 | Discharge: 2024-04-17 | Disposition: A | Discharge status: Home / Self Care | Source: Ambulatory Visit | Attending: Gastroenterology | Admitting: Gastroenterology

## 2024-04-17 DIAGNOSIS — K449 Diaphragmatic hernia without obstruction or gangrene: Secondary | ICD-10-CM | POA: Diagnosis not present

## 2024-04-17 DIAGNOSIS — Z791 Long term (current) use of non-steroidal anti-inflammatories (NSAID): Secondary | ICD-10-CM | POA: Insufficient documentation

## 2024-04-17 DIAGNOSIS — N3289 Other specified disorders of bladder: Secondary | ICD-10-CM | POA: Diagnosis not present

## 2024-04-17 DIAGNOSIS — R140 Abdominal distension (gaseous): Secondary | ICD-10-CM | POA: Diagnosis not present

## 2024-04-17 DIAGNOSIS — R070 Pain in throat: Secondary | ICD-10-CM | POA: Insufficient documentation

## 2024-04-17 DIAGNOSIS — K219 Gastro-esophageal reflux disease without esophagitis: Secondary | ICD-10-CM | POA: Insufficient documentation

## 2024-04-17 DIAGNOSIS — R1013 Epigastric pain: Secondary | ICD-10-CM | POA: Diagnosis not present

## 2024-04-17 MED ORDER — IOHEXOL 300 MG/ML  SOLN
100.0000 mL | Freq: Once | INTRAMUSCULAR | Status: AC | PRN
  Administered 2024-04-17: 100 mL via INTRAVENOUS

## 2024-04-21 DIAGNOSIS — R42 Dizziness and giddiness: Secondary | ICD-10-CM | POA: Diagnosis not present

## 2024-04-21 DIAGNOSIS — Z20822 Contact with and (suspected) exposure to covid-19: Secondary | ICD-10-CM | POA: Diagnosis not present

## 2024-04-21 NOTE — Telephone Encounter (Signed)
 Patient returning phone call. Please advise, thank you

## 2024-04-22 ENCOUNTER — Telehealth: Payer: Self-pay

## 2024-04-22 ENCOUNTER — Telehealth: Payer: Self-pay | Admitting: Family Medicine

## 2024-04-22 NOTE — Telephone Encounter (Signed)
 Attempted to call pt to discuss provider recommendations. No answer, left vm for pt to return call. Will forward to PCP since pt's has OV scheduled for tomorrow.

## 2024-04-22 NOTE — Telephone Encounter (Signed)
 Pt returned call. Discussed provider recommendations. Discussed SIBO test with pt. Did advise pt that there is a cost up front with the test. Pt would like to know if we are able to prescribe a trial of abts to see if it would help decrease the bloating without her having to pay for the test. Will route to provider to review.

## 2024-04-22 NOTE — Telephone Encounter (Signed)
 Pt phone not working to confirmed appt

## 2024-04-22 NOTE — Telephone Encounter (Signed)
 While talking with pt regarding results, pt expressed concerns about how she has been feeling lately. She states that she has felt intoxicated, like I have been drinking. She also reports feeling dizzy and off balance. States this all started 2-3 days ago. She also reports that she would wake up at night with chills and feeling clammy and then she would sweat profusely. Pt states she went to [facility] yesterday (office visit in care everywhere). They did orthostatic Bps and told her, it dropped but she wasn't sure by how much. She does report they did a urine sample and it came back normal. They diagnosed her with dehydration, and gave her medicine for the dizziness. Pt states this doesn't feel like typical dizziness, it feels like I'm drunk. She would like to know if there is any possibility this is auto-brewery syndrome and if there is anyway to test for that.

## 2024-04-23 ENCOUNTER — Ambulatory Visit: Attending: Family Medicine | Admitting: Family Medicine

## 2024-04-23 ENCOUNTER — Encounter: Payer: Self-pay | Admitting: Family Medicine

## 2024-04-23 VITALS — BP 116/79 | HR 93 | Ht 64.0 in | Wt 281.6 lb

## 2024-04-23 DIAGNOSIS — R42 Dizziness and giddiness: Secondary | ICD-10-CM

## 2024-04-23 DIAGNOSIS — R8290 Unspecified abnormal findings in urine: Secondary | ICD-10-CM | POA: Diagnosis not present

## 2024-04-23 DIAGNOSIS — K219 Gastro-esophageal reflux disease without esophagitis: Secondary | ICD-10-CM

## 2024-04-23 DIAGNOSIS — G5702 Lesion of sciatic nerve, left lower limb: Secondary | ICD-10-CM | POA: Diagnosis not present

## 2024-04-23 DIAGNOSIS — Z23 Encounter for immunization: Secondary | ICD-10-CM

## 2024-04-23 DIAGNOSIS — F411 Generalized anxiety disorder: Secondary | ICD-10-CM

## 2024-04-23 LAB — POCT URINALYSIS DIP (CLINITEK)
Bilirubin, UA: NEGATIVE
Blood, UA: NEGATIVE
Glucose, UA: NEGATIVE mg/dL
Ketones, POC UA: NEGATIVE mg/dL
Nitrite, UA: NEGATIVE
POC PROTEIN,UA: NEGATIVE
Spec Grav, UA: 1.02 (ref 1.010–1.025)
Urobilinogen, UA: 0.2 U/dL
pH, UA: 7 (ref 5.0–8.0)

## 2024-04-23 MED ORDER — OMEPRAZOLE 20 MG PO CPDR: 20.0000 mg | DELAYED_RELEASE_CAPSULE | Freq: Every day | ORAL | 3 refills | Status: DC

## 2024-04-23 NOTE — Telephone Encounter (Signed)
 Rx sent.

## 2024-04-23 NOTE — Progress Notes (Deleted)
 error

## 2024-04-23 NOTE — Telephone Encounter (Signed)
 Pt walked into facility and spoke with staff at the front desk. Per message from front desk staff:   this pt came and she said she went to her follow up with her PCP and she said they ruled out any other cause of her symptoms she is thinking it might be the medication that she is on - pantoprazole . she is wondering if she could get prescribed something else with less side effects?  Reviewed PCP visit note from today per plan:   2. Gastroesophageal reflux disease, unspecified whether esophagitis present Informed her that based on the timeline of her current sx and having been on pantoprazole  for months, it is unlikely to be causing her dizziness. Advised her to discuss her current dosage and regime with GI, though our recommend is that she only takes it as needed when symptomatic. - Continue pantoprazole  per GI.  - Continue with EGD in 05/2024 PCP is also referring pt to vestibular rehab. Will route to provider to review and advise.

## 2024-04-23 NOTE — Telephone Encounter (Signed)
 Spoke with pt. She reports she would like to switch back to the Omeprazole  20 mg every day. Pt is requesting a refill for this medication. Will route to provider to review.

## 2024-04-23 NOTE — Patient Instructions (Addendum)
 - Speak to your GI doctors about taking pantoprazole      How to Perform the Epley Maneuver The Epley maneuver is an exercise that relieves symptoms of vertigo. Vertigo is the feeling that you or your surroundings are moving when they are not. When you feel vertigo, you may feel like the room is spinning and may have trouble walking. The Epley maneuver is used for a type of vertigo caused by a calcium deposit in a part of the inner ear. The maneuver involves changing head positions to help the deposit move out of the area. You can do this maneuver at home whenever you have symptoms of vertigo. You can repeat it in 24 hours if your vertigo has not gone away. Even though the Epley maneuver may relieve your vertigo for a few weeks, it is possible that your symptoms will return. This maneuver relieves vertigo, but it does not relieve dizziness. What are the risks? If it is done correctly, the Epley maneuver is considered safe. Sometimes it can lead to dizziness or nausea that goes away after a short time. If you develop other symptoms--such as changes in vision, weakness, or numbness--stop doing the maneuver and call your health care provider. Supplies needed: A bed or table. A pillow. How to do the Epley maneuver     Sit on the edge of a bed or table with your back straight and your legs extended or hanging over the edge of the bed or table. Turn your head halfway toward the affected ear or side as told by your health care provider. Lie backward quickly with your head turned until you are lying flat on your back. Your head should dangle (head-hanging position). You may want to position a pillow under your shoulders. Hold this position for at least 30 seconds. If you feel dizzy or have symptoms of vertigo, continue to hold the position until the symptoms stop. Turn your head to the opposite direction until your unaffected ear is facing down. Your head should continue to dangle. Hold this position  for at least 30 seconds. If you feel dizzy or have symptoms of vertigo, continue to hold the position until the symptoms stop. Turn your whole body to the same side as your head so that you are positioned on your side. Your head will now be nearly facedown and no longer needs to dangle. Hold for at least 30 seconds. If you feel dizzy or have symptoms of vertigo, continue to hold the position until the symptoms stop. Sit back up. You can repeat the maneuver in 24 hours if your vertigo does not go away. Follow these instructions at home: For 24 hours after doing the Epley maneuver: Keep your head in an upright position. When lying down to sleep or rest, keep your head raised (elevated) with two or more pillows. Avoid excessive neck movements. Activity Do not drive or use machinery if you feel dizzy. After doing the Epley maneuver, return to your normal activities as told by your health care provider. Ask your health care provider what activities are safe for you. General instructions Drink enough fluid to keep your urine pale yellow. Do not drink alcohol. Take over-the-counter and prescription medicines only as told by your health care provider. Keep all follow-up visits. This is important. Preventing vertigo symptoms Ask your health care provider if there is anything you should do at home to prevent vertigo. He or she may recommend that you: Keep your head elevated with two or more pillows while  you sleep. Do not sleep on the side of your affected ear. Get up slowly from bed. Avoid sudden movements during the day. Avoid extreme head positions or movement, such as looking up or bending over. Contact a health care provider if: Your vertigo gets worse. You have other symptoms, including: Nausea. Vomiting. Headache. Get help right away if you: Have vision changes. Have a headache or neck pain that is severe or getting worse. Cannot stop vomiting. Have new numbness or weakness in any part  of your body. These symptoms may represent a serious problem that is an emergency. Do not wait to see if the symptoms will go away. Get medical help right away. Call your local emergency services (911 in the [place] Do not drive yourself to the hospital. Summary Vertigo is the feeling that you or your surroundings are moving when they are not. The Epley maneuver is an exercise that relieves symptoms of vertigo. If the Epley maneuver is done correctly, it is considered safe. This information is not intended to replace advice given to you by your health care provider. Make sure you discuss any questions you have with your health care provider. Document Revised: 04/05/2023 Document Reviewed: 04/05/2023 Elsevier Patient Education  2024 [employer].

## 2024-04-24 ENCOUNTER — Ambulatory Visit: Payer: Self-pay | Admitting: Family Medicine

## 2024-04-24 ENCOUNTER — Other Ambulatory Visit: Payer: Self-pay | Admitting: Gastroenterology

## 2024-04-24 ENCOUNTER — Ambulatory Visit (HOSPITAL_COMMUNITY)
Admission: RE | Admit: 2024-04-24 | Discharge: 2024-04-24 | Disposition: A | Discharge status: Home / Self Care | Source: Ambulatory Visit | Attending: Gastroenterology | Admitting: Gastroenterology

## 2024-04-24 DIAGNOSIS — R070 Pain in throat: Secondary | ICD-10-CM | POA: Insufficient documentation

## 2024-04-24 DIAGNOSIS — R1013 Epigastric pain: Secondary | ICD-10-CM | POA: Diagnosis present

## 2024-04-24 DIAGNOSIS — Z791 Long term (current) use of non-steroidal anti-inflammatories (NSAID): Secondary | ICD-10-CM

## 2024-04-24 DIAGNOSIS — K219 Gastro-esophageal reflux disease without esophagitis: Secondary | ICD-10-CM | POA: Insufficient documentation

## 2024-04-24 DIAGNOSIS — K449 Diaphragmatic hernia without obstruction or gangrene: Secondary | ICD-10-CM | POA: Diagnosis not present

## 2024-04-24 DIAGNOSIS — R140 Abdominal distension (gaseous): Secondary | ICD-10-CM | POA: Insufficient documentation

## 2024-04-24 LAB — COMPREHENSIVE METABOLIC PANEL WITH GFR
ALT: 14 IU/L (ref 0–32)
AST: 16 IU/L (ref 0–40)
Albumin: 4 g/dL (ref 3.9–4.9)
Alkaline Phosphatase: 74 IU/L (ref 41–116)
BUN/Creatinine Ratio: 11 (ref 9–23)
BUN: 9 mg/dL (ref 6–24)
Bilirubin Total: 0.4 mg/dL (ref 0.0–1.2)
CO2: 21 mmol/L (ref 20–29)
Calcium: 9.5 mg/dL (ref 8.7–10.2)
Chloride: 101 mmol/L (ref 96–106)
Creatinine, Ser: 0.79 mg/dL (ref 0.57–1.00)
Globulin, Total: 2.7 g/dL (ref 1.5–4.5)
Glucose: 102 mg/dL — ABNORMAL HIGH (ref 70–99)
Potassium: 4.5 mmol/L (ref 3.5–5.2)
Sodium: 136 mmol/L (ref 134–144)
Total Protein: 6.7 g/dL (ref 6.0–8.5)
eGFR: 96 mL/min/1.73 (ref >59)

## 2024-04-24 LAB — CBC WITH DIFFERENTIAL/PLATELET
Basophils Absolute: 0 x10E3/uL (ref 0.0–0.2)
Basos: 1 %
EOS (ABSOLUTE): 0.1 x10E3/uL (ref 0.0–0.4)
Eos: 2 %
Hematocrit: 39.8 % (ref 34.0–46.6)
Hemoglobin: 12.4 g/dL (ref 11.1–15.9)
Immature Grans (Abs): 0 x10E3/uL (ref 0.0–0.1)
Immature Granulocytes: 0 %
Lymphocytes Absolute: 3.3 x10E3/uL — ABNORMAL HIGH (ref 0.7–3.1)
Lymphs: 42 %
MCH: 28.4 pg (ref 26.6–33.0)
MCHC: 31.2 g/dL — ABNORMAL LOW (ref 31.5–35.7)
MCV: 91 fL (ref 79–97)
Monocytes Absolute: 0.6 x10E3/uL (ref 0.1–0.9)
Monocytes: 8 %
Neutrophils Absolute: 3.7 x10E3/uL (ref 1.4–7.0)
Neutrophils: 46 %
Platelets: 344 x10E3/uL (ref 150–450)
RBC: 4.36 x10E6/uL (ref 3.77–5.28)
RDW: 11.8 % (ref 11.7–15.4)
WBC: 7.8 x10E3/uL (ref 3.4–10.8)

## 2024-04-24 NOTE — Telephone Encounter (Signed)
 Copied from CRM [id number]. Topic: Clinical - Lab/Test Results >> Apr 24, 2024  3:02 PM Santiya F wrote: Reason for CRM: Patient is calling in because she has some questions about her lab results. Please follow up with patient.

## 2024-04-25 NOTE — Progress Notes (Signed)
 Patient ID: Breanna Thomas, female    DOB: August 20, 1980  MRN: [id number]  CC: Medical Management of Chronic Issues (Dizzy spells for 3 days/)   Subjective: Breanna Thomas is a 43 y.o. female w/ a history of GAD, GERD, and chronic ds management.  Dizziness: Pt presented to [facility] on 04/21/2024 for mild acute dizziness. Workup included UA, UPT, and COVID testing, all negative. Glucose was 143. Orthostatic vitals showed an 8 point drop in SBP with associated lightheadedness but otherwise was negative. Their findings were not consistent with TIA/CVA, DKA, hyperglycemia, or sepsis. Dix-Hallpike maneuver did not appear to be performed.According to the note, she was instructed to perform home Epley maneuvers (pt unaware) and was advised to increase fluid intake. Meclizine  was prescribed for symptomatic relief but worsened her dizziness, so she discontinued it after taking it once.   She describes her dizziness as feeling tipsy/drunk and off balance but has not fallen. Symptoms began 3-4 days ago and worsen with walking. She reports intermittent palpitations in the past few days but feels this could be possibly anxiety related. She has not been drinking a lot of water since going to urgent care but has been drinking a flavored electrolyte drink. Meclizine  and walking worsen her dizziness. Denies worsening sx with closing her eyes. Denies room spinning, double vision, chest pain, nasal congestion, recent head injury, or changes in hearing or vision. Does not notice anything improving sx.  She is also concerned that the pantoprazole  she started 01/2024 may be contributing to her sx after reading on [employer] that dizziness is a side effect.  GERD: She previously had abdominal pain and bloating, and was referred to GI. Workup included abd CT scan which showed a tiny hiatal hernia but no other abnormalities of the stomach or bowel to explain her upper abdominal pain and bloating. GI  recommended an EGD for further evaluation, and she is scheduled for this procedure in 05/2024. The CT scan also demonstrated symmetric wall thickening of the bladder. She was advised to get a urinalysis which was performed at the same urgent care on 04/21/2024. She presented afebrile with a few days of Urinary frequency and urgency. UA was largely unremarkable. Denies current urinary sx.  GAD: Stopped Prozac several months ago due to fever, chills, and flu-like symptoms, which initially worsened after discontinuation but have since resolved. Has f/u with behavioral health soon.  Piriformis Syndrome / Left-sided Sciatica: Previously diagnosed with piriformis syndrome and left sided sciatica. Left buttock numbness improving with exercise. Ultrasound negative for blood clots. Sciatica managed with physical therapy and pain management. Referred her to PT in 12/2023 for this but she did not attend due to feeling sick. Denies urinary or bowel incontinence, worsening pain, numbness, or tingling. PT appointment is still scheduled, and she is agreeable to continue with previous plan.  HM due  Wants Influenza Vaccine    Patient Active Problem List   Diagnosis Date Noted   Nondisplaced fracture of greater tuberosity of right humerus, initial encounter for closed fracture 02/07/2023   Major depressive disorder, recurrent episode, moderate with anxious distress (HCC) 03/24/2020   Nonspecific abnormal electrocardiogram (ECG) (EKG) 02/26/2019   Morbid obesity (HCC) 02/26/2019   Chest discomfort 02/26/2019   Erroneous encounter - disregard 01/25/2019   GERD (gastroesophageal reflux disease) 04/20/2016   Cough 04/20/2016   IFG (impaired fasting glucose) 12/30/2013   Vitamin D  deficiency 12/30/2013   Back muscle spasm 12/15/2013   Anxiety 04/10/2013     Current  Outpatient Medications on File Prior to Visit  Medication Sig Dispense Refill   acetaminophen  (TYLENOL ) 500 MG tablet Take 500 mg by mouth every 6  (six) hours as needed for moderate pain (pain score 4-6).     albuterol  (VENTOLIN  HFA) 108 (90 Base) MCG/ACT inhaler Inhale 1 puff into the lungs every 4 (four) hours as needed for wheezing or shortness of breath.     cetirizine  (ZYRTEC ) 10 MG tablet Take 1 tablet (10 mg total) by mouth daily. 30 tablet 0   hydrOXYzine  (ATARAX ) 25 MG tablet Take 0.5-1 tablets (12.5-25 mg total) by mouth every 8 (eight) hours as needed for itching. 30 tablet 0   amoxicillin -clavulanate (AUGMENTIN ) 875-125 MG tablet Take 1 tablet by mouth every 12 (twelve) hours. (Patient not taking: Reported on 04/23/2024) 14 tablet 0   FLUoxetine (PROZAC) 20 MG capsule Take 20 mg by mouth daily. (Patient not taking: Reported on 04/23/2024)     magic mouthwash (lidocaine , diphenhydrAMINE , alum & mag hydroxide) suspension Swish and spit 5 mLs 3 (three) times daily as needed for mouth pain. (Patient not taking: Reported on 04/23/2024) 75 mL 0   omeprazole  (PRILOSEC) 20 MG capsule Take 1 capsule (20 mg total) by mouth daily. 90 capsule 3   sucralfate  (CARAFATE ) 1 g tablet Take 1 tablet (1 g total) by mouth with breakfast, with lunch, and with evening meal. (Patient not taking: Reported on 04/23/2024) 90 tablet 0   No current facility-administered medications on file prior to visit.    Allergies  Allergen Reactions   Nsaids     Gastro problems so was told not to take    Social History   Socioeconomic History   Marital status: Divorced    Spouse name: Not on file   Number of children: 3   Years of education: College   Highest education level: Not on file  Occupational History    Employer: [employer]    Comment: [employer]  Tobacco Use   Smoking status: Never   Smokeless tobacco: Never  Vaping Use   Vaping status: Never Used  Substance and Sexual Activity   Alcohol use: Not Currently   Drug use: No   Sexual activity: Not Currently  Other Topics Concern   Not on file  Social History Narrative   Patient lives at  home alone.   Caffeine Use: none in the past 2 weeks, soda occasionally   Social Drivers of Corporate investment banker Strain: Not on file  Food Insecurity: Not on file  Transportation Needs: Not on file  Physical Activity: Not on file  Stress: Not on file  Social Connections: Unknown (11/24/2021)   Received from [facility]   Social Network    Social Network: Not on file  Intimate Partner Violence: Unknown (10/23/2021)   Received from [facility]   HITS    Physically Hurt: Not on file    Insult or Talk Down To: Not on file    Threaten Physical Harm: Not on file    Scream or Curse: Not on file    Family History  Adopted: Yes  Problem Relation Age of Onset   Diabetes Mother    Hypertension Mother    Heart disease Father    Seizures Father    Seizures Other    Heart failure Other    Diabetes Sister     Past Surgical History:  Procedure Laterality Date   NO PAST SURGERIES      ROS: Review of Systems Negative  except as stated above  PHYSICAL EXAM: BP 116/79   Pulse 93   Ht 5' 4 (1.626 m)   Wt 281 lb 9.6 oz (127.7 kg)   LMP 03/15/2024 (Exact Date)   SpO2 100%   BMI 48.34 kg/m   Physical Exam  General appearance - alert, well appearing, obese AAF in no distress Mental status - normal mood, behavior, speech, dress, motor activity, and thought processes HEENT- Pt sounds congested. EEOM intact though w/ associated dizziness. B/l sclerotic changes in her ears. Mucous membranes moist, pharynx normal without lesions. No cervical lymphadenopathy.  Chest - clear to auscultation, no wheezes, rales or rhonchi, symmetric air entry Heart - normal rate, regular rhythm, normal S1, S2, no murmurs, rubs, clicks or gallops Abdomen - soft, nontender, nondistended, no masses or organomegaly Back exam - no CVA tenderness, symmetric  Neurological -  Intact sensation to light touch of b/l face, UE, and LE. 5/5 strength UE and LE. 2+ patellar reflexes.  Romberg sign negative. Gait  was normal, though patient reports feeling whoozy while walking. Dix hallpike test positive. Extremities - no pedal edema noted      Latest Ref Rng & Units 04/23/2024   12:27 PM 04/10/2024    2:44 PM 01/14/2024    7:34 PM  CMP  Glucose 70 - 99 mg/dL 897  91  873   BUN 6 - 24 mg/dL 9  10  11    Creatinine 0.57 - 1.00 mg/dL 9.20  9.23  9.22   Sodium 134 - 144 mmol/L 136  139  136   Potassium 3.5 - 5.2 mmol/L 4.5  3.7  3.1   Chloride 96 - 106 mmol/L 101  106  105   CO2 20 - 29 mmol/L 21  28  22    Calcium 8.7 - 10.2 mg/dL 9.5  9.1  8.8   Total Protein 6.0 - 8.5 g/dL 6.7  7.3  7.1   Total Bilirubin 0.0 - 1.2 mg/dL 0.4  0.5  0.4   Alkaline Phos 41 - 116 IU/L 74  75  75   AST 0 - 40 IU/L 16  19  22    ALT 0 - 32 IU/L 14  12  13     Lipid Panel     Component Value Date/Time   CHOL 174 11/07/2023 1438   TRIG 81 11/07/2023 1438   HDL 40 11/07/2023 1438   CHOLHDL 5.0 (H) 04/06/2021 1209   LDLCALC 119 (H) 11/07/2023 1438    CBC    Component Value Date/Time   WBC 7.8 04/23/2024 1227   WBC 7.5 04/10/2024 1444   RBC 4.36 04/23/2024 1227   RBC 4.35 04/10/2024 1444   HGB 12.4 04/23/2024 1227   HCT 39.8 04/23/2024 1227   PLT 344 04/23/2024 1227   MCV 91 04/23/2024 1227   MCH 28.4 04/23/2024 1227   MCH 28.1 01/14/2024 1934   MCHC 31.2 (L) 04/23/2024 1227   MCHC 32.0 04/10/2024 1444   RDW 11.8 04/23/2024 1227   LYMPHSABS 3.3 (H) 04/23/2024 1227   MONOABS 0.5 04/10/2024 1444   EOSABS 0.1 04/23/2024 1227   BASOSABS 0.0 04/23/2024 1227     ASSESSMENT AND PLAN:  Assessment and Plan   1. Vertigo (Primary) Based on [facility] test being positive, we will refer her to vestibular rehab at this time. Orthostatic vitals at recent urgent care visit were negative and patient appears well hydrated today. EKG negative for cardiac etiology of dizziness. Will check electrolytes and assess for anemia. We  will also refer her to vestibular rehab and told her to stop taking meclizine  to await  their recommendations. Provided her information on home epley maneuvers.  - Will consider a head CT scan if her sx worsen or do not improve.  - Comprehensive metabolic panel - Ambulatory referral to Physical Therapy - CBC with Differential/Platelet - EKG 12-Lead  2. Gastroesophageal reflux disease, unspecified whether esophagitis present Informed her that based on the timeline of her current sx and having been on pantoprazole  for months, it is unlikely to be causing her dizziness. Advised her to discuss her current dosage and regime with GI, though our recommend is that she only takes it as needed when symptomatic. - Continue pantoprazole  per GI.  - Continue with EGD in 05/2024  3. GAD (generalized anxiety disorder) Pt stopped taking Prozac on her own.  Advised her that abruptly stopping SSRIs can cause flu like symptoms.  - Continue management of anxiety with Behavioral health.  4. Need for immunization against influenza - Flu vaccine trivalent PF, 6mos and older(Flulaval,Afluria,Fluarix,Fluzone)  5. Abnormal urinalysis Previous UA largely unremarkable but had positive leukocytes. On repeat today UA results are the same. - POCT URINALYSIS DIP (CLINITEK)  6. Piriformis syndrome of left side Her sx are stable at this time. She has not yet seen PT. - Advised her to continue with her scheduled PT appt.  - Prescribe tizanidine  as needed for muscle relaxation. - Consider MRI if symptoms do not improve with physical therapy and medication.   Patient was given the opportunity to ask questions.  Patient verbalized understanding of the plan and was able to repeat key elements of the plan.    Orders Placed This Encounter  Procedures   Comprehensive metabolic panel   CBC with Differential/Platelet   Ambulatory referral to Physical Therapy   POCT URINALYSIS DIP (CLINITEK)   EKG 12-Lead     Requested Prescriptions    No prescriptions requested or ordered in this encounter    Return  for previously scheduled appointment.  This is a Psychologist, occupational Note.  The care of the patient was discussed with Dr. Isai Gottlieb and the assessment and plan formulated with her assistance.  Please see her attestation of this encounter.  Duwaine Amber, MS3 [employer]    Evaluation and management procedures were performed by me with Medical Student in attendance, note written by Medical Student under my supervision and collaboration. I have reviewed the note and I agree with the management and plan.   Corrina Sabin, MD, FAAFP. [facility] [place], [place] [phone]   04/25/2024, 7:58 AM

## 2024-04-28 ENCOUNTER — Ambulatory Visit: Payer: Self-pay | Admitting: Gastroenterology

## 2024-05-06 ENCOUNTER — Ambulatory Visit: Admitting: Physical Therapy

## 2024-05-18 ENCOUNTER — Ambulatory Visit: Attending: Family Medicine | Admitting: Physical Therapy

## 2024-05-18 NOTE — Therapy (Incomplete)
 OUTPATIENT PHYSICAL THERAPY THORACOLUMBAR EVALUATION   Patient Name: Breanna Thomas MRN: [id number] DOB:Nov 23, 1980, 43 y.o., female Today's Date: 05/18/2024  END OF SESSION:   Past Medical History:  Diagnosis Date   Anemia    Anxiety    Anxiety    GERD (gastroesophageal reflux disease)    Panic attacks    Vertigo    Past Surgical History:  Procedure Laterality Date   NO PAST SURGERIES     Patient Active Problem List   Diagnosis Date Noted   Nondisplaced fracture of greater tuberosity of right humerus, initial encounter for closed fracture 02/07/2023   Major depressive disorder, recurrent episode, moderate with anxious distress (HCC) 03/24/2020   Nonspecific abnormal electrocardiogram (ECG) (EKG) 02/26/2019   Morbid obesity (HCC) 02/26/2019   Chest discomfort 02/26/2019   Erroneous encounter - disregard 01/25/2019   GERD (gastroesophageal reflux disease) 04/20/2016   Cough 04/20/2016   IFG (impaired fasting glucose) 12/30/2013   Vitamin D  deficiency 12/30/2013   Back muscle spasm 12/15/2013   Anxiety 04/10/2013    PCP: Delbert, MD  REFERRING PROVIDER: Sheril, MD  REFERRING DIAG: LBP, left hip pain, vertigo  Rationale for Evaluation and Treatment: Rehabilitation  THERAPY DIAG:  No diagnosis found.  ONSET DATE: 04/16/24  SUBJECTIVE:                                                                                                                                                                                           SUBJECTIVE STATEMENT: ***  PERTINENT HISTORY:  ***  PAIN:  Are you having pain? Yes: NPRS scale: *** Pain location: *** Pain description: *** Aggravating factors: *** Relieving factors: ***  PRECAUTIONS: {Therapy precautions:24002}  RED FLAGS: {PT Red Flags:29287}   WEIGHT BEARING RESTRICTIONS: No  FALLS:  Has patient fallen in last 6 months? {fallsyesno:27318}  LIVING ENVIRONMENT: Lives with: lives with their family Lives in:  House/apartment Stairs: {opstairs:27293} Has following equipment at home: {Assistive devices:23999}  OCCUPATION: ***  PLOF: {PLOF:24004}  PATIENT GOALS: ***  NEXT MD VISIT: ***  OBJECTIVE:  Note: Objective measures were completed at Evaluation unless otherwise noted.  DIAGNOSTIC FINDINGS:  ***  PATIENT SURVEYS:  {rehab surveys:24030}  COGNITION: Overall cognitive status: {cognition:24006}     SENSATION: {sensation:27233}  MUSCLE LENGTH: Hamstrings: Right *** deg; Left *** deg Debby test: Right *** deg; Left *** deg  POSTURE: {posture:25561}  PALPATION: ***  LUMBAR ROM:   AROM eval  Flexion   Extension   Right lateral flexion   Left lateral flexion   Right rotation   Left rotation    (Blank rows = not tested)  LOWER EXTREMITY ROM:  Active  Right eval Left eval  Hip flexion    Hip extension    Hip abduction    Hip adduction    Hip internal rotation    Hip external rotation    Knee flexion    Knee extension    Ankle dorsiflexion    Ankle plantarflexion    Ankle inversion    Ankle eversion     (Blank rows = not tested)  LOWER EXTREMITY MMT:    MMT Right eval Left eval  Hip flexion    Hip extension    Hip abduction    Hip adduction    Hip internal rotation    Hip external rotation    Knee flexion    Knee extension    Ankle dorsiflexion    Ankle plantarflexion    Ankle inversion    Ankle eversion     (Blank rows = not tested)  LUMBAR SPECIAL TESTS:  {lumbar special test:25242}  FUNCTIONAL TESTS:  {Functional tests:24029}  GAIT: Distance walked: *** Assistive device utilized: {Assistive devices:23999} Level of assistance: {Levels of assistance:24026} Comments: ***  TREATMENT DATE: ***                                                                                                                                 PATIENT EDUCATION:  Education details: *** Person educated: {Person educated:25204} Education method:  {Education Method:25205} Education comprehension: {Education Comprehension:25206}  HOME EXERCISE PROGRAM: ***  ASSESSMENT:  CLINICAL IMPRESSION: Patient is a 43 y.o. female who was seen today for physical therapy evaluation and treatment for ***.   OBJECTIVE IMPAIRMENTS: Abnormal gait, cardiopulmonary status limiting activity, decreased activity tolerance, decreased balance, decreased endurance, decreased mobility, difficulty walking, decreased ROM, decreased strength, dizziness, increased muscle spasms, impaired flexibility, impaired vision/preception, improper body mechanics, postural dysfunction, and pain.   REHAB POTENTIAL: Good  CLINICAL DECISION MAKING: Stable/uncomplicated  EVALUATION COMPLEXITY: Low   GOALS: Goals reviewed with patient? Yes  SHORT TERM GOALS: Target date: 06/11/24  Independent with initial HEP Baseline: Goal status: INITIAL  LONG TERM GOALS: Target date: 07/22/24  Independent with advanced HEP Baseline:  Goal status: INITIAL  2.  *** Baseline:  Goal status: INITIAL  3.  *** Baseline:  Goal status: INITIAL  4.  *** Baseline:  Goal status: INITIAL  5.  *** Baseline:  Goal status: INITIAL  6.  *** Baseline:  Goal status: INITIAL  PLAN:  PT FREQUENCY: {rehab frequency:25116}  PT DURATION: 12 weeks  PLANNED INTERVENTIONS: 97164- PT Re-evaluation, 97110-Therapeutic exercises, 97530- Therapeutic activity, 97112- Neuromuscular re-education, 97535- Self Care, 02859- Manual therapy, [id number]- Canalith repositioning, H9716- Electrical stimulation (unattended), 97035- Ultrasound, 02987- Traction (mechanical), Patient/Family education, Balance training, Joint mobilization, Vestibular training, Visual/preceptual remediation/compensation, Cryotherapy, and Moist heat.  PLAN FOR NEXT SESSION: PIERRETTE OBADIAH OZELL LELON, PT 05/18/2024, 7:38 AM

## 2024-05-25 ENCOUNTER — Encounter: Payer: Self-pay | Admitting: Radiology

## 2024-05-25 ENCOUNTER — Encounter: Admitting: Gastroenterology

## 2024-05-25 ENCOUNTER — Telehealth: Payer: Self-pay | Admitting: Gastroenterology

## 2024-05-25 NOTE — Telephone Encounter (Signed)
 ok

## 2024-05-25 NOTE — Telephone Encounter (Signed)
 Goodmorning Dr.Nandigam,  Patient called stating she is going to have to cancel and call back to reschedule EGD for today 05/25/24 at 2 pm due to getting sick over the weekend and waking up with a fever.  Please advise  Thank you

## 2024-05-29 ENCOUNTER — Ambulatory Visit
Admission: EM | Admit: 2024-05-29 | Discharge: 2024-05-29 | Disposition: A | Discharge status: Home / Self Care | Attending: Family Medicine | Admitting: Family Medicine

## 2024-05-29 ENCOUNTER — Ambulatory Visit: Admitting: Family Medicine

## 2024-05-29 DIAGNOSIS — Z711 Person with feared health complaint in whom no diagnosis is made: Secondary | ICD-10-CM | POA: Diagnosis not present

## 2024-05-29 DIAGNOSIS — G8929 Other chronic pain: Secondary | ICD-10-CM

## 2024-05-29 DIAGNOSIS — M25552 Pain in left hip: Secondary | ICD-10-CM | POA: Diagnosis not present

## 2024-05-29 DIAGNOSIS — R209 Unspecified disturbances of skin sensation: Secondary | ICD-10-CM | POA: Diagnosis not present

## 2024-05-29 NOTE — Discharge Instructions (Addendum)
 [facility] Physical therapist in [place], [place]  Address: [address] [place], [place], [place] [address] Phone: [phone]  Follow up with [facility] to see if they can help with your chronic hip pain. You can also recheck with your orthopedist for further management of said hip pain.

## 2024-05-29 NOTE — ED Triage Notes (Signed)
 Pt reports her feet feels cold x 2 weeks, ot concern for poor circulation; on and off weakness in legs and swelling in the hip x 2 weeks. States she has an orthopedic Dr for her hip and steroid shot gives no relief. Pt wants to know if she needs ultrasound, any type of scan.

## 2024-05-29 NOTE — ED Provider Notes (Signed)
 [name]  Note:  This document was prepared using Conservation officer, historic buildings and may include unintentional dictation errors.  MRN: [id number] DOB: 1980/08/22  Subjective:   [facility] is a 43 y.o. female presenting for 2-week history of persistent cold sensation in her feet.  Has concerned that she does not have good circulation of her feet.  Also has acute on chronic left hip pain.  Is not able to tolerate steroids or NSAIDs.  Is seeing a GI specialist for this.  She is also seeing an orthopedist for her right hip but has not gotten good results with steroid injections.  She is supposed to start physical therapy but does not know where to go.  No smoking.  No diabetes.  No history of peripheral vascular or arterial disease.  No current facility-administered medications for this encounter.  Current Outpatient Medications:    acetaminophen  (TYLENOL ) 500 MG tablet, Take 500 mg by mouth every 6 (six) hours as needed for moderate pain (pain score 4-6)., Disp: , Rfl:    albuterol  (VENTOLIN  HFA) 108 (90 Base) MCG/ACT inhaler, Inhale 1 puff into the lungs every 4 (four) hours as needed for wheezing or shortness of breath., Disp: , Rfl:    amoxicillin -clavulanate (AUGMENTIN ) 875-125 MG tablet, Take 1 tablet by mouth every 12 (twelve) hours. (Patient not taking: No sig reported), Disp: 14 tablet, Rfl: 0   cetirizine  (ZYRTEC ) 10 MG tablet, Take 1 tablet (10 mg total) by mouth daily., Disp: 30 tablet, Rfl: 0   FLUoxetine (PROZAC) 20 MG capsule, Take 20 mg by mouth daily. (Patient not taking: No sig reported), Disp: , Rfl:    hydrOXYzine  (ATARAX ) 25 MG tablet, Take 0.5-1 tablets (12.5-25 mg total) by mouth every 8 (eight) hours as needed for itching., Disp: 30 tablet, Rfl: 0   magic mouthwash (lidocaine , diphenhydrAMINE , alum & mag hydroxide) suspension, Swish and spit 5 mLs 3 (three) times daily as needed for mouth pain. (Patient not taking: No sig reported), Disp:  75 mL, Rfl: 0   omeprazole  (PRILOSEC) 20 MG capsule, Take 1 capsule (20 mg total) by mouth daily., Disp: 90 capsule, Rfl: 3   sucralfate  (CARAFATE ) 1 g tablet, Take 1 tablet (1 g total) by mouth with breakfast, with lunch, and with evening meal. (Patient not taking: Reported on 04/10/2024), Disp: 90 tablet, Rfl: 0   Allergies  Allergen Reactions   Nsaids     Gastro problems so was told not to take    Past Medical History:  Diagnosis Date   Anemia    Anxiety    Anxiety    GERD (gastroesophageal reflux disease)    Panic attacks    Vertigo      Past Surgical History:  Procedure Laterality Date   NO PAST SURGERIES      Family History  Adopted: Yes  Problem Relation Age of Onset   Diabetes Mother    Hypertension Mother    Heart disease Father    Seizures Father    Seizures Other    Heart failure Other    Diabetes Sister     Social History   Tobacco Use   Smoking status: Never   Smokeless tobacco: Never  Vaping Use   Vaping status: Never Used  Substance Use Topics   Alcohol use: Not Currently   Drug use: No    ROS   Objective:   Vitals: BP 136/88 (BP Location: Left Arm)   Pulse 84   Temp 98.9 F (  37.2 C) (Oral)   Resp 18   LMP 05/20/2024 (Within Weeks)   SpO2 100%   Physical Exam Constitutional:      General: She is not in acute distress.    Appearance: Normal appearance. She is well-developed. She is not ill-appearing, toxic-appearing or diaphoretic.  HENT:     Head: Normocephalic and atraumatic.     Nose: Nose normal.     Mouth/Throat:     Mouth: Mucous membranes are moist.  Eyes:     General: No scleral icterus.       Right eye: No discharge.        Left eye: No discharge.     Extraocular Movements: Extraocular movements intact.  Cardiovascular:     Rate and Rhythm: Normal rate.  Pulmonary:     Effort: Pulmonary effort is normal.  Musculoskeletal:     Left hip: Tenderness (laterally) present. No deformity, lacerations, bony tenderness or  crepitus. Normal range of motion. Normal strength.     Right foot: Normal range of motion and normal capillary refill. No swelling, deformity, laceration, tenderness, bony tenderness or crepitus. Normal pulse.     Left foot: Normal range of motion and normal capillary refill. No swelling, deformity, laceration, tenderness, bony tenderness or crepitus. Normal pulse.       Legs:     Comments: Brisk capillary refill for both extremities.  Dorsalis pedis and posterior tibialis pulses 2+.  Skin temperature warm.  Skin:    General: Skin is warm and dry.  Neurological:     General: No focal deficit present.     Mental Status: She is alert and oriented to person, place, and time.  Psychiatric:        Mood and Affect: Mood normal.        Behavior: Behavior normal.     Assessment and Plan :   PDMP not reviewed this encounter.  1. Chronic left hip pain   2. Feared condition not demonstrated   3. Sensation of cold in lower extremity    Patient's foot exam is completely normal.  I reassured patient.  Can follow-up with her PCP.  Regarding her chronic left hip pain, recommended trying a physical therapist with [facility].  Otherwise, recommend following up with her orthopedist for further management.  Can use Tylenol  for pain relief.  Counseled patient on potential for adverse effects with medications prescribed/recommended today, ER and return-to-clinic precautions discussed, patient verbalized understanding.    Christopher Savannah, PA-C 05/29/24 1230

## 2024-06-08 ENCOUNTER — Ambulatory Visit
Admission: EM | Admit: 2024-06-08 | Discharge: 2024-06-08 | Disposition: A | Discharge status: Home / Self Care | Attending: Urgent Care | Admitting: Urgent Care

## 2024-06-08 ENCOUNTER — Ambulatory Visit: Payer: Self-pay

## 2024-06-08 DIAGNOSIS — K0889 Other specified disorders of teeth and supporting structures: Secondary | ICD-10-CM | POA: Diagnosis not present

## 2024-06-08 DIAGNOSIS — K047 Periapical abscess without sinus: Secondary | ICD-10-CM

## 2024-06-08 MED ORDER — AMOXICILLIN-POT CLAVULANATE 875-125 MG PO TABS: 1.0000 | ORAL_TABLET | Freq: Two times a day (BID) | ORAL | 0 refills | Status: AC

## 2024-06-08 MED ORDER — TRAMADOL HCL 50 MG PO TABS: 50.0000 mg | ORAL_TABLET | Freq: Four times a day (QID) | ORAL | 0 refills | Status: DC | PRN

## 2024-06-08 NOTE — ED Provider Notes (Signed)
 [facility]  Note:  This document was prepared using Conservation officer, historic buildings and may include unintentional dictation errors.  MRN: [id number] DOB: 16-Jul-1981  Subjective:   [facility] is a 43 y.o. female presenting for 1 week history of persistent and recurrent right lower dental pain.  Patient has previously had issues with this area but unfortunately has not been able to establish with a dentist.  Pain radiates into her jaw and up along the right side of her face.  No fever, drainage, bleeding.  No current facility-administered medications for this encounter.  Current Outpatient Medications:    acetaminophen  (TYLENOL ) 500 MG tablet, Take 500 mg by mouth every 6 (six) hours as needed for moderate pain (pain score 4-6)., Disp: , Rfl:    albuterol  (VENTOLIN  HFA) 108 (90 Base) MCG/ACT inhaler, Inhale 1 puff into the lungs every 4 (four) hours as needed for wheezing or shortness of breath., Disp: , Rfl:    amoxicillin -clavulanate (AUGMENTIN ) 875-125 MG tablet, Take 1 tablet by mouth every 12 (twelve) hours. (Patient not taking: No sig reported), Disp: 14 tablet, Rfl: 0   cetirizine  (ZYRTEC ) 10 MG tablet, Take 1 tablet (10 mg total) by mouth daily., Disp: 30 tablet, Rfl: 0   FLUoxetine (PROZAC) 20 MG capsule, Take 20 mg by mouth daily. (Patient not taking: No sig reported), Disp: , Rfl:    hydrOXYzine  (ATARAX ) 25 MG tablet, Take 0.5-1 tablets (12.5-25 mg total) by mouth every 8 (eight) hours as needed for itching., Disp: 30 tablet, Rfl: 0   magic mouthwash (lidocaine , diphenhydrAMINE , alum & mag hydroxide) suspension, Swish and spit 5 mLs 3 (three) times daily as needed for mouth pain. (Patient not taking: No sig reported), Disp: 75 mL, Rfl: 0   omeprazole  (PRILOSEC) 20 MG capsule, Take 1 capsule (20 mg total) by mouth daily., Disp: 90 capsule, Rfl: 3   sucralfate  (CARAFATE ) 1 g tablet, Take 1 tablet (1 g total) by mouth with breakfast, with lunch, and with  evening meal. (Patient not taking: Reported on 04/10/2024), Disp: 90 tablet, Rfl: 0   Allergies  Allergen Reactions   Nsaids     Gastro problems so was told not to take    Past Medical History:  Diagnosis Date   Anemia    Anxiety    Anxiety    GERD (gastroesophageal reflux disease)    Panic attacks    Vertigo      Past Surgical History:  Procedure Laterality Date   NO PAST SURGERIES      Family History  Adopted: Yes  Problem Relation Age of Onset   Diabetes Mother    Hypertension Mother    Heart disease Father    Seizures Father    Seizures Other    Heart failure Other    Diabetes Sister     Social History   Tobacco Use   Smoking status: Never   Smokeless tobacco: Never  Vaping Use   Vaping status: Never Used  Substance Use Topics   Alcohol use: Not Currently   Drug use: No    ROS   Objective:   Vitals: BP 136/83 (BP Location: Left Arm)   Pulse 88   Temp 98.7 F (37.1 C) (Oral)   Resp 16   LMP 05/20/2024 (Within Weeks)   SpO2 98%   Physical Exam Constitutional:      General: She is not in acute distress.    Appearance: Normal appearance. She is well-developed. She is not ill-appearing,  toxic-appearing or diaphoretic.  HENT:     Head: Normocephalic and atraumatic.     Nose: Nose normal.     Mouth/Throat:     Mouth: Mucous membranes are moist.     Dentition: Does not have dentures. Dental caries present. No dental tenderness, gingival swelling, dental abscesses or gum lesions.     Pharynx: No pharyngeal swelling, oropharyngeal exudate, posterior oropharyngeal erythema or uvula swelling.     Tonsils: No tonsillar exudate or tonsillar abscesses. 0 on the right. 0 on the left.   Eyes:     General: No scleral icterus.       Right eye: No discharge.        Left eye: No discharge.     Extraocular Movements: Extraocular movements intact.  Cardiovascular:     Rate and Rhythm: Normal rate.  Pulmonary:     Effort: Pulmonary effort is normal.   Skin:    General: Skin is warm and dry.  Neurological:     General: No focal deficit present.     Mental Status: She is alert and oriented to person, place, and time.  Psychiatric:        Mood and Affect: Mood normal.        Behavior: Behavior normal.    Assessment and Plan :   I have reviewed the PDMP during this encounter.  1. Dental infection   2. Pain, dental    Start Augmentin  for dental infection/abscess, use tramadol for pain since she cannot tolerate NSAIDs due to an ulcer. Emphasized need for dental surgeon consult. Counseled patient on potential for adverse effects with medications prescribed/recommended today, strict ER and return-to-clinic precautions discussed, patient verbalized understanding.    [facility], [place] 06/08/24 [phone]

## 2024-06-08 NOTE — ED Triage Notes (Signed)
 Pt c/o right lower dental pain x 1 week-taking tylenol  and a gel-NAD-steady gait

## 2024-06-08 NOTE — Discharge Instructions (Addendum)
 Start amoxicillin -clavulanate for a suspected dental infection. Please schedule Tylenol  at 500 mg - 650 mg once every 6 hours as needed for aches and pains.  If you still have pain despite taking Tylenol  regularly, this is breakthrough pain.  You can use tramadol once every 6 hours for this.  Once your pain is better controlled, switch back to just Tylenol . Make sure you establish care with a new dental specialist for definitive treatment.

## 2024-06-12 DIAGNOSIS — M7062 Trochanteric bursitis, left hip: Secondary | ICD-10-CM | POA: Diagnosis not present

## 2024-06-12 DIAGNOSIS — M25552 Pain in left hip: Secondary | ICD-10-CM | POA: Diagnosis not present

## 2024-06-20 ENCOUNTER — Ambulatory Visit
Admission: RE | Admit: 2024-06-20 | Discharge: 2024-06-20 | Disposition: A | Discharge status: Home / Self Care | Attending: Physician Assistant | Admitting: Physician Assistant

## 2024-06-20 VITALS — BP 117/83 | HR 98 | Temp 98.0°F | Resp 16

## 2024-06-20 DIAGNOSIS — K029 Dental caries, unspecified: Secondary | ICD-10-CM | POA: Diagnosis not present

## 2024-06-20 MED ORDER — TRAMADOL HCL 50 MG PO TABS: 50.0000 mg | ORAL_TABLET | Freq: Two times a day (BID) | ORAL | 0 refills | Status: AC | PRN

## 2024-06-20 NOTE — ED Triage Notes (Signed)
 Pt states right dental pain for the past 3 weeks.  States she has a education officer, community appointment on Wednesday. States she is out of the Tramadol  that she was given a few weeks ago and Tylenol  does not help the pain.

## 2024-06-20 NOTE — Discharge Instructions (Signed)
 VISIT SUMMARY:  You came in today because of tooth pain that has been bothering you for over two weeks. The pain is mainly in your back teeth, gets worse at night, and hasn't been helped by extra strength Tylenol . You previously went to an urgent care where you were given antibiotics and pain medication, but you have run out of the pain medication. You also mentioned swelling and hardness in your gums, which improved with the antibiotics. You have a dental appointment scheduled for Wednesday.  YOUR PLAN:  -DENTAL ABSCESS WITH PAIN: A dental abscess is an infection at the root of a tooth or between the gum and a tooth, causing pain and swelling. You have been experiencing severe tooth pain, especially at night, and swelling around your gums. We have refilled your pain medication to help manage the pain until your dental appointment on Wednesday. Additionally, you can use Biotene mouthwash and Orajel for temporary pain relief. Please make sure to follow up with your dentist as scheduled.  INSTRUCTIONS:  Please follow up with your dentist as scheduled on Wednesday.

## 2024-06-20 NOTE — ED Provider Notes (Signed)
 [facility]    CSN: [id number] Arrival date & time: 06/20/24  1459      History   Chief Complaint Chief Complaint  Patient presents with   Oral Swelling    Tooth ache pain.SABRAswelling - Entered by patient    HPI [facility] is a 43 y.o. female.  has a past medical history of Anemia, Anxiety, Anxiety, GERD (gastroesophageal reflux disease), Panic attacks, and Vertigo.   HPI  Discussed the use of AI scribe software for clinical note transcription with the patient, who gave verbal consent to proceed.  The patient presents with tooth pain.  They have been experiencing tooth pain for over two weeks, primarily affecting the back teeth. The pain intensifies at night and is not relieved by extra strength Tylenol . Initially, they sought care at an urgent care facility where they were prescribed antibiotics and pain medication. However, they have since run out of tramadol .  They describe associated gum swelling and hardness in the area of the back teeth, with previous drainage and a bad taste in their mouth, which resolved with antibiotic treatment. No fever or chills. Due to their history of stomach ulcers, they cannot take ibuprofen  for pain management. They have an upcoming dental appointment scheduled for Wednesday.   Past Medical History:  Diagnosis Date   Anemia    Anxiety    Anxiety    GERD (gastroesophageal reflux disease)    Panic attacks    Vertigo     Patient Active Problem List   Diagnosis Date Noted   Nondisplaced fracture of greater tuberosity of right humerus, initial encounter for closed fracture 02/07/2023   Major depressive disorder, recurrent episode, moderate with anxious distress (HCC) 03/24/2020   Nonspecific abnormal electrocardiogram (ECG) (EKG) 02/26/2019   Morbid obesity (HCC) 02/26/2019   Chest discomfort 02/26/2019   Erroneous encounter - disregard 01/25/2019   GERD (gastroesophageal reflux disease) 04/20/2016   Cough 04/20/2016    IFG (impaired fasting glucose) 12/30/2013   Vitamin D  deficiency 12/30/2013   Back muscle spasm 12/15/2013   Anxiety 04/10/2013    Past Surgical History:  Procedure Laterality Date   NO PAST SURGERIES      OB History     Gravida  3   Para  3   Term      Preterm      AB      Living         SAB      IAB      Ectopic      Multiple      Live Births               Home Medications    Prior to Admission medications   Medication Sig Start Date End Date Taking? Authorizing Provider  traMADol  (ULTRAM ) 50 MG tablet Take 1 tablet (50 mg total) by mouth every 12 (twelve) hours as needed for up to 4 days for severe pain (pain score 7-10). 06/20/24 06/24/24 Yes Keidan Aumiller E, PA-C  acetaminophen  (TYLENOL ) 500 MG tablet Take 500 mg by mouth every 6 (six) hours as needed for moderate pain (pain score 4-6). 10/01/19   [provider]  albuterol  (VENTOLIN  HFA) 108 (90 Base) MCG/ACT inhaler Inhale 1 puff into the lungs every 4 (four) hours as needed for wheezing or shortness of breath. 07/27/19   [provider]  amoxicillin -clavulanate (AUGMENTIN ) 875-125 MG tablet Take 1 tablet by mouth every 12 (twelve) hours. 06/08/24   Christopher Savannah, PA-C  cetirizine  (ZYRTEC ) 10 MG tablet Take 1 tablet (10 mg total) by mouth daily. 08/05/23   Christopher Savannah, PA-C  FLUoxetine (PROZAC) 20 MG capsule Take 20 mg by mouth daily. Patient not taking: No sig reported 04/03/24   [provider]  hydrOXYzine  (ATARAX ) 25 MG tablet Take 0.5-1 tablets (12.5-25 mg total) by mouth every 8 (eight) hours as needed for itching. 05/27/23   Christopher Savannah, PA-C  magic mouthwash (lidocaine , diphenhydrAMINE , alum & mag hydroxide) suspension Swish and spit 5 mLs 3 (three) times daily as needed for mouth pain. Patient not taking: No sig reported 04/13/24   Palumbo, April, MD  omeprazole  (PRILOSEC) 20 MG capsule Take 1 capsule (20 mg total) by mouth daily. 04/23/24   Heinz, Camie BRAVO, PA-C  sucralfate   (CARAFATE ) 1 g tablet Take 1 tablet (1 g total) by mouth with breakfast, with lunch, and with evening meal. Patient not taking: Reported on 04/10/2024 01/02/24   Billy Asberry FALCON, PA-C    Family History Family History  Adopted: Yes  Problem Relation Age of Onset   Diabetes Mother    Hypertension Mother    Heart disease Father    Seizures Father    Seizures Other    Heart failure Other    Diabetes Sister     Social History Social History   Tobacco Use   Smoking status: Never   Smokeless tobacco: Never  Vaping Use   Vaping status: Never Used  Substance Use Topics   Alcohol use: Not Currently   Drug use: No     Allergies   Nsaids   Review of Systems Review of Systems  Constitutional:  Negative for chills and fever.  HENT:  Positive for dental problem and facial swelling.      Physical Exam Triage Vital Signs ED Triage Vitals  Encounter Vitals Group     BP 06/20/24 1537 117/83     Girls Systolic BP Percentile --      Girls Diastolic BP Percentile --      Boys Systolic BP Percentile --      Boys Diastolic BP Percentile --      Pulse Rate 06/20/24 1537 98     Resp 06/20/24 1537 16     Temp 06/20/24 1537 98 F (36.7 C)     Temp Source 06/20/24 1537 Oral     SpO2 06/20/24 1537 98 %     Weight --      Height --      Head Circumference --      Peak Flow --      Pain Score 06/20/24 1536 8     Pain Loc --      Pain Education --      Exclude from Growth Chart --    No data found.  Updated Vital Signs BP 117/83 (BP Location: Left Arm)   Pulse 98   Temp 98 F (36.7 C) (Oral)   Resp 16   LMP 05/20/2024 (Within Weeks)   SpO2 98%   Visual Acuity Right Eye Distance:   Left Eye Distance:   Bilateral Distance:    Right Eye Near:   Left Eye Near:    Bilateral Near:     Physical Exam Vitals reviewed.  Constitutional:      General: She is awake.     Appearance: Normal appearance. She is well-developed and well-groomed.  HENT:     Head: Normocephalic  and atraumatic.     Jaw: Tenderness present.  Mouth/Throat:     Lips: Pink.     Mouth: Mucous membranes are moist.     Dentition: Dental tenderness and dental caries present. No gingival swelling, dental abscesses or gum lesions.     Pharynx: Oropharynx is clear. Uvula midline.   Eyes:     General: Lids are normal. Gaze aligned appropriately.     Extraocular Movements: Extraocular movements intact.     Conjunctiva/sclera: Conjunctivae normal.  Pulmonary:     Effort: Pulmonary effort is normal.  Lymphadenopathy:     Head:     Right side of head: No submental, submandibular or preauricular adenopathy.     Left side of head: No submental, submandibular or preauricular adenopathy.  Neurological:     Mental Status: She is alert and oriented to person, place, and time.  Psychiatric:        Attention and Perception: Attention and perception normal.        Mood and Affect: Mood and affect normal.        Speech: Speech normal.        Behavior: Behavior normal. Behavior is cooperative.      UC Treatments / Results  Labs (all labs ordered are listed, but only abnormal results are displayed) Labs Reviewed - No data to display  EKG   Radiology No results found.  Procedures Procedures (including critical care time)  Medications Ordered in UC Medications - No data to display  Initial Impression / Assessment and Plan / UC Course  I have reviewed the triage vital signs and the nursing notes.  Pertinent labs & imaging results that were available during my care of the patient were reviewed by me and considered in my medical decision making (see chart for details).      Final Clinical Impressions(s) / UC Diagnoses   Final diagnoses:  Pain due to dental caries   Dental abscess with pain Tooth pain for over two weeks, likely due to dental abscess. Pain is severe, especially at night, and unrelieved by acetaminophen . Swelling and hardness around the gum line, but no  significant swelling or signs of active infection. Previous antibiotics resolved drainage and bad taste. Pain localized to two posterior teeth. No fever or chills. - Refilled pain medication to manage pain until dental appointment on Wednesday. PDMP reviewed, no evidence of worrisome controlled substance use patterns at this time  - Advised use of Biotene mouthwash and Orajel for pain relief. - Ensure follow-up with dentist as scheduled.    Discharge Instructions      VISIT SUMMARY:  You came in today because of tooth pain that has been bothering you for over two weeks. The pain is mainly in your back teeth, gets worse at night, and hasn't been helped by extra strength Tylenol . You previously went to an urgent care where you were given antibiotics and pain medication, but you have run out of the pain medication. You also mentioned swelling and hardness in your gums, which improved with the antibiotics. You have a dental appointment scheduled for Wednesday.  YOUR PLAN:  -DENTAL ABSCESS WITH PAIN: A dental abscess is an infection at the root of a tooth or between the gum and a tooth, causing pain and swelling. You have been experiencing severe tooth pain, especially at night, and swelling around your gums. We have refilled your pain medication to help manage the pain until your dental appointment on Wednesday. Additionally, you can use Biotene mouthwash and Orajel for temporary pain relief. Please make sure to  follow up with your dentist as scheduled.  INSTRUCTIONS:  Please follow up with your dentist as scheduled on Wednesday.     ED Prescriptions     Medication Sig Dispense Auth. Provider   traMADol  (ULTRAM ) 50 MG tablet Take 1 tablet (50 mg total) by mouth every 12 (twelve) hours as needed for up to 4 days for severe pain (pain score 7-10). 8 tablet Maybell Misenheimer E, PA-C      I have reviewed the PDMP during this encounter.   Marylene Rocky BRAVO, PA-C 06/20/24 1658

## 2024-06-29 DIAGNOSIS — M7062 Trochanteric bursitis, left hip: Secondary | ICD-10-CM | POA: Diagnosis not present

## 2024-07-02 DIAGNOSIS — M25552 Pain in left hip: Secondary | ICD-10-CM | POA: Diagnosis not present

## 2024-07-08 ENCOUNTER — Ambulatory Visit: Admitting: Gastroenterology

## 2024-07-08 ENCOUNTER — Encounter: Payer: Self-pay | Admitting: Gastroenterology

## 2024-07-08 VITALS — BP 110/78 | HR 99 | Ht 64.0 in | Wt 284.4 lb

## 2024-07-08 DIAGNOSIS — R140 Abdominal distension (gaseous): Secondary | ICD-10-CM

## 2024-07-08 DIAGNOSIS — S73192A Other sprain of left hip, initial encounter: Secondary | ICD-10-CM | POA: Diagnosis not present

## 2024-07-08 DIAGNOSIS — M461 Sacroiliitis, not elsewhere classified: Secondary | ICD-10-CM | POA: Diagnosis not present

## 2024-07-08 DIAGNOSIS — K30 Functional dyspepsia: Secondary | ICD-10-CM | POA: Diagnosis not present

## 2024-07-08 DIAGNOSIS — M25852 Other specified joint disorders, left hip: Secondary | ICD-10-CM | POA: Diagnosis not present

## 2024-07-08 DIAGNOSIS — K449 Diaphragmatic hernia without obstruction or gangrene: Secondary | ICD-10-CM | POA: Diagnosis not present

## 2024-07-08 DIAGNOSIS — M25552 Pain in left hip: Secondary | ICD-10-CM | POA: Diagnosis not present

## 2024-07-08 DIAGNOSIS — K581 Irritable bowel syndrome with constipation: Secondary | ICD-10-CM

## 2024-07-08 DIAGNOSIS — K219 Gastro-esophageal reflux disease without esophagitis: Secondary | ICD-10-CM

## 2024-07-08 MED ORDER — OMEPRAZOLE 40 MG PO CPDR: 40.0000 mg | DELAYED_RELEASE_CAPSULE | Freq: Every day | ORAL | 3 refills | Status: AC

## 2024-07-08 NOTE — Patient Instructions (Addendum)
 We have sent the following medications to your pharmacy for you to pick up at your convenience: Omeprazole  40 mg once daily   VISIT SUMMARY:  During your visit, we discussed your ongoing abdominal bloating and discomfort, likely related to your hiatal hernia and possible lactose intolerance. We also addressed your infrequent bowel movements and adjusted your medication to better manage your symptoms.  YOUR PLAN:  HIATAL HERNIA: You have a small hiatal hernia that may be causing acid reflux and associated symptoms. -Increase omeprazole  to 40 mg daily to manage acid reflux and associated symptoms.  FUNCTIONAL DYSPEPSIA AND BLOATING, LIKELY RELATED TO LACTOSE INTOLERANCE: Your bloating and discomfort may be related to lactose intolerance, especially after consuming dairy products and large meals. -Avoid dairy products for one week to test for lactose intolerance. -Use Ibgard for bloating as needed three times daily.  CONSTIPATION: You have infrequent bowel movements, which may contribute to your bloating and discomfort. -Start taking MiraLAX daily, adjusting the dose to achieve one or two bowel movements per day.   _______________________________________________________  If your blood pressure at your visit was 140/90 or greater, please contact your primary care physician to follow up on this.  _______________________________________________________  If you are age 9 or older, your body mass index should be between 23-30. Your Body mass index is 48.81 kg/m. If this is out of the aforementioned range listed, please consider follow up with your Primary Care Provider.  If you are age 66 or younger, your body mass index should be between 19-25. Your Body mass index is 48.81 kg/m. If this is out of the aformentioned range listed, please consider follow up with your Primary Care Provider.   ________________________________________________________  The [facility] providers would like to  encourage you to use MYCHART to communicate with providers for non-urgent requests or questions.  Due to long hold times on the telephone, sending your provider a message by [facility] may be a faster and more efficient way to get a response.  Please allow 48 business hours for a response.  Please remember that this is for non-urgent requests.  _______________________________________________________  [name] is using a team-based approach to care.  Your team is made up of your doctor and two to three APPS. Our APPS (Nurse Practitioners and Physician Assistants) work with your physician to ensure care continuity for you. They are fully qualified to address your health concerns and develop a treatment plan. They communicate directly with your gastroenterologist to care for you. Seeing the Advanced Practice Practitioners on your physician's team can help you by facilitating care more promptly, often allowing for earlier appointments, access to diagnostic testing, procedures, and other specialty referrals.   Thank you for choosing me and [facility].  Dr.Nandigam

## 2024-07-08 NOTE — Progress Notes (Signed)
 Drinda E Lacerte    [id number]    Nov 26, 1980  Primary Care Physician:Newlin, Corrina, MD  Referring Physician: Delbert Corrina, MD [address] [place],  [place] [address]   Chief complaint: Abdominal bloating, discomfort  Discussed the use of AI scribe software for clinical note transcription with the patient, who gave verbal consent to proceed.  History of Present Illness Breanna Thomas is a 43 year old female with a hiatal hernia who presents with abdominal bloating and discomfort.  Abdominal pain and bloating - Persistent stomach pain and bloating, with intermittent improvement but recent recurrence - Bloating occurs after eating regular-sized meals - Discomfort and anxiety when sleeping on her side - Previously eliminated meats except seafood from diet, which initially improved symptoms, but symptoms have recurred - Continues to consume dairy products and questions if they contribute to symptoms  Hiatal hernia and gastrointestinal history - History of small hiatal hernia identified on upper GI series - Previously told she had an ulcer, but upper GI series showed no ulcer - Discontinued ibuprofen  due to concerns about ulcers, which were not confirmed  Bowel habits - Bowel movements occur every other day or every two days - No current use of medications or supplements to aid bowel movements  Medication use and tolerability - Currently taking omeprazole  20 mg once daily without adverse effects - Previously discontinued another medication due to dizziness, which resolved after stopping the medication  Upper GI series 04/24/2024 1. Small hiatal hernia. 2.  No gastroesophageal reflux. 3.  normal esophagus, stomach and duodenum  CT abdomen pelvis with contrast 04/17/2024 1. Symmetric wall thickening of a minimally distended urinary bladder. Correlate with urinalysis to exclude cystitis. 2. Tiny hiatal hernia.  Outpatient Encounter  Medications as of 07/08/2024  Medication Sig   acetaminophen  (TYLENOL ) 500 MG tablet Take 500 mg by mouth every 6 (six) hours as needed for moderate pain (pain score 4-6).   albuterol  (VENTOLIN  HFA) 108 (90 Base) MCG/ACT inhaler Inhale 1 puff into the lungs every 4 (four) hours as needed for wheezing or shortness of breath.   amoxicillin -clavulanate (AUGMENTIN ) 875-125 MG tablet Take 1 tablet by mouth every 12 (twelve) hours.   cetirizine  (ZYRTEC ) 10 MG tablet Take 1 tablet (10 mg total) by mouth daily.   hydrOXYzine  (ATARAX ) 25 MG tablet Take 0.5-1 tablets (12.5-25 mg total) by mouth every 8 (eight) hours as needed for itching.   omeprazole  (PRILOSEC) 20 MG capsule Take 1 capsule (20 mg total) by mouth daily.   [DISCONTINUED] FLUoxetine (PROZAC) 20 MG capsule Take 20 mg by mouth daily. (Patient not taking: No sig reported)   [DISCONTINUED] magic mouthwash (lidocaine , diphenhydrAMINE , alum & mag hydroxide) suspension Swish and spit 5 mLs 3 (three) times daily as needed for mouth pain. (Patient not taking: No sig reported)   [DISCONTINUED] sucralfate  (CARAFATE ) 1 g tablet Take 1 tablet (1 g total) by mouth with breakfast, with lunch, and with evening meal. (Patient not taking: Reported on 04/10/2024)   No facility-administered encounter medications on file as of 07/08/2024.    Allergies as of 07/08/2024 - Review Complete 07/08/2024  Allergen Reaction Noted   Nsaids  04/13/2024    Past Medical History:  Diagnosis Date   Anemia    Anxiety    Anxiety    GERD (gastroesophageal reflux disease)    Panic attacks    Vertigo     Past Surgical History:  Procedure Laterality Date  NO PAST SURGERIES      Family History  Adopted: Yes  Problem Relation Age of Onset   Diabetes Mother    Hypertension Mother    Heart disease Father    Seizures Father    Seizures Other    Heart failure Other    Diabetes Sister     Social History   Socioeconomic History   Marital status: Divorced     Spouse name: Not on file   Number of children: 3   Years of education: College   Highest education level: Not on file  Occupational History    Employer: [employer]    Comment: [employer]  Tobacco Use   Smoking status: Never   Smokeless tobacco: Never  Vaping Use   Vaping status: Never Used  Substance and Sexual Activity   Alcohol use: Not Currently   Drug use: No   Sexual activity: Not Currently    Birth control/protection: None  Other Topics Concern   Not on file  Social History Narrative   Patient lives at home alone.   Caffeine Use: none in the past 2 weeks, soda occasionally   Social Drivers of Health   Tobacco Use: Low Risk (06/20/2024)   Patient History    Smoking Tobacco Use: Never    Smokeless Tobacco Use: Never    Passive Exposure: Not on file  Financial Resource Strain: Not on file  Food Insecurity: Not on file  Transportation Needs: Not on file  Physical Activity: Not on file  Stress: Not on file  Social Connections: Unknown (11/24/2021)   Received from [facility]   Social Network    Social Network: Not on file  Intimate Partner Violence: Unknown (10/23/2021)   Received from [facility]   HITS    Physically Hurt: Not on file    Insult or Talk Down To: Not on file    Threaten Physical Harm: Not on file    Scream or Curse: Not on file  Depression (PHQ2-9): Low Risk (04/23/2024)   Depression (PHQ2-9)    PHQ-2 Score: 4  Alcohol Screen: Not on file  Housing: Not on file  Utilities: Not on file  Health Literacy: Not on file      Review of systems: All other review of systems negative except as mentioned in the HPI.   Physical Exam: Vitals:   07/08/24 1440  BP: 110/78  Pulse: 99   Body mass index is 48.81 kg/m. Gen:      No acute distress HEENT:  sclera anicteric CV: s1s2 rrr, no murmur Lungs: B/l clear. Abd:      soft, non-tender; no palpable masses, no distension Ext:    No edema Neuro: alert and oriented x 3 Psych: normal mood  and affect  Data Reviewed:  Reviewed labs, radiology imaging, old records and pertinent past GI work up   Assessment and Plan Assessment & Plan Hiatal hernia Small hiatal hernia identified on upper GI series, not causing significant symptoms. No evidence of ulceration or other concerning findings. 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 - Increased omeprazole  to 40 mg daily to manage acid reflux and associated symptoms.  Functional dyspepsia and bloating, likely related to lactose intolerance Bloating and dyspepsia likely related to lactose intolerance. Symptoms include bloating after consuming dairy products and large meals. Upper GI series and CT scan were reassuring, showing no ulcers or other significant findings. Avoidance of dairy products  is recommended to test for lactose intolerance. Discussed that lactose intolerance is common and avoiding dairy is not harmful, though it may cause bloating if consumed. - Avoid dairy products for one week to test for lactose intolerance. - Provided samples of Ibgard for bloating, to be used as needed.  IBS constipation Infrequent bowel movements, occurring every other day or every two days. No current use of laxatives. Constipation may contribute to bloating and discomfort. - Start MiraLAX daily, adjusting dose to achieve one or two bowel movements per day.     This visit required 40 minutes of patient care (this includes precharting, chart review, review of results, face-to-face time used for counseling as well as treatment plan and follow-up. The patient was provided an opportunity to ask questions and all were answered. The patient agreed with the plan and demonstrated an understanding of the instructions.  LOIS Wilkie Mcgee , MD    CC: Delbert Clam, MD

## 2024-07-28 ENCOUNTER — Other Ambulatory Visit: Payer: Self-pay

## 2024-07-28 ENCOUNTER — Ambulatory Visit
Admission: RE | Admit: 2024-07-28 | Discharge: 2024-07-28 | Disposition: A | Discharge status: Home / Self Care | Source: Ambulatory Visit | Attending: Physician Assistant

## 2024-07-28 VITALS — BP 134/85 | HR 86 | Temp 97.7°F | Resp 17

## 2024-07-28 DIAGNOSIS — R0989 Other specified symptoms and signs involving the circulatory and respiratory systems: Secondary | ICD-10-CM | POA: Diagnosis not present

## 2024-07-28 LAB — POC SOFIA SARS ANTIGEN FIA: SARS Coronavirus 2 Ag: NEGATIVE

## 2024-07-28 LAB — POCT INFLUENZA A/B
Influenza A, POC: NEGATIVE
Influenza B, POC: NEGATIVE

## 2024-07-28 MED ORDER — FLUTICASONE PROPIONATE 50 MCG/ACT NA SUSP: 1.0000 | Freq: Every day | NASAL | 0 refills | Status: AC

## 2024-07-28 MED ORDER — BENZONATATE 100 MG PO CAPS: 100.0000 mg | ORAL_CAPSULE | Freq: Three times a day (TID) | ORAL | 0 refills | Status: AC

## 2024-07-28 NOTE — Discharge Instructions (Signed)

## 2024-07-28 NOTE — ED Provider Notes (Signed)
 " [facility]    CSN: [id number] Arrival date & time: 07/28/24  1408      History   Chief Complaint Chief Complaint  Patient presents with   Nasal Congestion    Runny nose, congestion, soar throat, fever - Entered by patient    HPI Breanna Thomas is a 44 y.o. female.   HPI  Pt is here today with concerns of fever, chill, nasal congestion, coughing, loss of taste and smell, rhinorrhea She states this has been ongoing for about 4 days She denies recent known sick contacts but states she did got to the hospital with her son so is unsure if she caught something there She denies a previous hx of asthma or breathing conditions Interventions: tylenol  and Nyquil   Past Medical History:  Diagnosis Date   Anemia    Anxiety    Anxiety    GERD (gastroesophageal reflux disease)    Panic attacks    Vertigo     Patient Active Problem List   Diagnosis Date Noted   Nondisplaced fracture of greater tuberosity of right humerus, initial encounter for closed fracture 02/07/2023   Major depressive disorder, recurrent episode, moderate with anxious distress (HCC) 03/24/2020   Nonspecific abnormal electrocardiogram (ECG) (EKG) 02/26/2019   Morbid obesity (HCC) 02/26/2019   Chest discomfort 02/26/2019   Erroneous encounter - disregard 01/25/2019   GERD (gastroesophageal reflux disease) 04/20/2016   Cough 04/20/2016   IFG (impaired fasting glucose) 12/30/2013   Vitamin D  deficiency 12/30/2013   Back muscle spasm 12/15/2013   Anxiety 04/10/2013    Past Surgical History:  Procedure Laterality Date   NO PAST SURGERIES      OB History     Gravida  3   Para  3   Term      Preterm      AB      Living         SAB      IAB      Ectopic      Multiple      Live Births               Home Medications    Prior to Admission medications  Medication Sig Start Date End Date Taking? Authorizing Provider  benzonatate  (TESSALON ) 100 MG capsule Take 1  capsule (100 mg total) by mouth every 8 (eight) hours. 07/28/24  Yes Bence Trapp E, PA-C  fluticasone  (FLONASE ) 50 MCG/ACT nasal spray Place 1 spray into both nostrils daily. 07/28/24  Yes Edge Mauger E, PA-C  acetaminophen  (TYLENOL ) 500 MG tablet Take 500 mg by mouth every 6 (six) hours as needed for moderate pain (pain score 4-6). 10/01/19   [provider]  albuterol  (VENTOLIN  HFA) 108 (90 Base) MCG/ACT inhaler Inhale 1 puff into the lungs every 4 (four) hours as needed for wheezing or shortness of breath. 07/27/19   [provider]  amoxicillin -clavulanate (AUGMENTIN ) 875-125 MG tablet Take 1 tablet by mouth every 12 (twelve) hours. 06/08/24   Christopher Savannah, PA-C  cetirizine  (ZYRTEC ) 10 MG tablet Take 1 tablet (10 mg total) by mouth daily. 08/05/23   Christopher Savannah, PA-C  hydrOXYzine  (ATARAX ) 25 MG tablet Take 0.5-1 tablets (12.5-25 mg total) by mouth every 8 (eight) hours as needed for itching. 05/27/23   Christopher Savannah, PA-C  omeprazole  (PRILOSEC) 40 MG capsule Take 1 capsule (40 mg total) by mouth daily. 07/08/24   Nandigam, Kavitha V, MD    Family History Family History  Adopted: Yes  Problem Relation Age of Onset   Diabetes Mother    Hypertension Mother    Heart disease Father    Seizures Father    Diabetes Sister    Seizures Other    Heart failure Other     Social History Social History[1]   Allergies   Nsaids   Review of Systems Review of Systems  Constitutional:  Positive for chills and fever.  HENT:  Positive for congestion, rhinorrhea and sore throat.   Respiratory:  Positive for cough. Negative for shortness of breath and wheezing.   Gastrointestinal:  Negative for diarrhea, nausea and vomiting.  Skin:  Negative for rash.     Physical Exam Triage Vital Signs ED Triage Vitals  Encounter Vitals Group     BP 07/28/24 1454 134/85     Girls Systolic BP Percentile --      Girls Diastolic BP Percentile --      Boys Systolic BP Percentile --      Boys  Diastolic BP Percentile --      Pulse Rate 07/28/24 1454 86     Resp 07/28/24 1454 17     Temp 07/28/24 1454 97.7 F (36.5 C)     Temp Source 07/28/24 1454 Oral     SpO2 07/28/24 1454 96 %     Weight --      Height --      Head Circumference --      Peak Flow --      Pain Score 07/28/24 1452 0     Pain Loc --      Pain Education --      Exclude from Growth Chart --    No data found.  Updated Vital Signs BP 134/85 (BP Location: Right Arm)   Pulse 86   Temp 97.7 F (36.5 C) (Oral)   Resp 17   LMP 07/22/2024 (Within Weeks)   SpO2 96%   Visual Acuity Right Eye Distance:   Left Eye Distance:   Bilateral Distance:    Right Eye Near:   Left Eye Near:    Bilateral Near:     Physical Exam Vitals reviewed.  Constitutional:      General: She is awake.     Appearance: Normal appearance. She is well-developed and well-groomed.  HENT:     Head: Normocephalic and atraumatic.     Right Ear: Hearing, tympanic membrane and ear canal normal.     Left Ear: Hearing, tympanic membrane and ear canal normal.     Mouth/Throat:     Lips: Pink.     Mouth: Mucous membranes are moist.     Pharynx: Oropharynx is clear. Uvula midline. No pharyngeal swelling, oropharyngeal exudate, posterior oropharyngeal erythema, uvula swelling or postnasal drip.  Cardiovascular:     Rate and Rhythm: Normal rate and regular rhythm.     Pulses: Normal pulses.          Radial pulses are 2+ on the right side and 2+ on the left side.     Heart sounds: Normal heart sounds. No murmur heard.    No friction rub. No gallop.  Pulmonary:     Effort: Pulmonary effort is normal.     Breath sounds: Normal breath sounds. No decreased air movement. No decreased breath sounds, wheezing, rhonchi or rales.  Musculoskeletal:     Cervical back: Normal range of motion and neck supple.  Lymphadenopathy:     Head:     Right side of head: No  submental, submandibular or preauricular adenopathy.     Left side of head: No  submental, submandibular or preauricular adenopathy.     Cervical:     Right cervical: No superficial cervical adenopathy.    Left cervical: No superficial cervical adenopathy.     Upper Body:     Right upper body: No supraclavicular adenopathy.     Left upper body: No supraclavicular adenopathy.  Skin:    General: Skin is warm and dry.  Neurological:     General: No focal deficit present.     Mental Status: She is alert and oriented to person, place, and time.  Psychiatric:        Mood and Affect: Mood normal.        Behavior: Behavior normal. Behavior is cooperative.        Thought Content: Thought content normal.        Judgment: Judgment normal.      UC Treatments / Results  Labs (all labs ordered are listed, but only abnormal results are displayed) Labs Reviewed  POC SOFIA SARS ANTIGEN FIA  POCT INFLUENZA A/B    EKG   Radiology No results found.  Procedures Procedures (including critical care time)  Medications Ordered in UC Medications - No data to display  Initial Impression / Assessment and Plan / UC Course  I have reviewed the triage vital signs and the nursing notes.  Pertinent labs & imaging results that were available during my care of the patient were reviewed by me and considered in my medical decision making (see chart for details).      Final Clinical Impressions(s) / UC Diagnoses   Final diagnoses:  Symptoms of upper respiratory infection (URI)   Patient is here today with concerns of fever, chills, nasal congestion coughing loss of taste and smell has been ongoing for about 4 days.  Vitals and physical exam are largely reassuring and rapid testing is negative for COVID and flu.  At this time recommend continued use of OTC medications for symptomatic relief.  Will send prescription for Flonase  and Tessalon  Perles to assist with symptomatic management.  ED and return precautions reviewed and provided in AVS.  Follow-up as needed.    Discharge  Instructions      Based on your described symptoms and the duration of symptoms it is likely that you have a viral upper respiratory infection (often called a cold)  Symptoms can last for 3-10 days with lingering cough and intermittent symptoms potentially  lasting several  weeks after that.  The goal of treatment at this time is to reduce your symptoms and discomfort   You can use the following medications and measures to help yourself feel better until your body fights this off: DayQuil/NyQuil, TheraFlu, Alka-Seltzer  (these medications typically have the same active ingredients in them so you can choose whichever one you prefer and take consistently during the day and night according to the manufactures instructions.) Flonase  A daily antihistamine such as Zyrtec , Claritin, Allegra per your preference.  Please choose 1 and take consistently. Increased fluids.  It is recommended that you take in at least 64 ounces of water per day when you are not sick so it is important to increase this when you are sick and your body may be running fever. Rest Cough drops Chloraseptic throat spray to help with sore throat Nasal saline spray or nasal flushes to help with congestion and runny nose  If your symptoms seem like they are getting worse over  the next 5 to 7 days or not improving you can always follow-up here in urgent care or go to your primary care provider for further management. Go to the ER if you begin to have more serious symptoms such as shortness of breath, trouble breathing, loss of consciousness, swelling around the eyes, high fever, severe lasting headaches, vision changes or neck pain/stiffness.       ED Prescriptions     Medication Sig Dispense Auth. Provider   fluticasone  (FLONASE ) 50 MCG/ACT nasal spray Place 1 spray into both nostrils daily. 11.1 mL Keyira Mondesir E, PA-C   benzonatate  (TESSALON ) 100 MG capsule Take 1 capsule (100 mg total) by mouth every 8 (eight) hours. 21  capsule Jayah Balthazar E, PA-C      PDMP not reviewed this encounter.     [1]  Social History Tobacco Use   Smoking status: Never   Smokeless tobacco: Never  Vaping Use   Vaping status: Never Used  Substance Use Topics   Alcohol use: Not Currently   Drug use: No     Danyel Tobey, Rocky BRAVO, PA-C 07/31/24 1203  "

## 2024-07-28 NOTE — ED Triage Notes (Addendum)
 Pt presents with c/o nasal congestion, cough, fevers, sore throat, and loss of taste/smell. This is day three of symptoms. Highest temperature at home has been 100 F. OTC Tylenol  + Nyquil taken for symptoms with no improvement/relief. Denies sick contacts. No pain. Voices discomfort/sickness all over.

## 2024-08-04 ENCOUNTER — Ambulatory Visit
Admission: EM | Admit: 2024-08-04 | Discharge: 2024-08-04 | Disposition: A | Discharge status: Home / Self Care | Attending: Family Medicine | Admitting: Family Medicine

## 2024-08-04 DIAGNOSIS — S0123XA Puncture wound without foreign body of nose, initial encounter: Secondary | ICD-10-CM | POA: Diagnosis not present

## 2024-08-04 DIAGNOSIS — L089 Local infection of the skin and subcutaneous tissue, unspecified: Secondary | ICD-10-CM | POA: Diagnosis not present

## 2024-08-04 MED ORDER — DOXYCYCLINE HYCLATE 100 MG PO CAPS: 100.0000 mg | ORAL_CAPSULE | Freq: Two times a day (BID) | ORAL | 0 refills | Status: AC

## 2024-08-04 NOTE — ED Provider Notes (Signed)
 " [facility]  Note:  This document was prepared using Conservation officer, historic buildings and may include unintentional dictation errors.  MRN: [id number] DOB: 12-12-1980  Subjective:   Breanna Thomas is a 44 y.o. female presenting for 2-day history of drainage and tenderness from her left nostril piercing.  Patient had a viral URI recently.  She was having to blow her nose quite a bit.  Believes that she got an infection from this.  No fever, nasal swelling.  The piercing is not new.  Current Outpatient Medications  Medication Instructions   acetaminophen  (TYLENOL ) 500 mg, Every 6 hours PRN   albuterol  (VENTOLIN  HFA) 108 (90 Base) MCG/ACT inhaler 1 puff, Every 4 hours PRN   amoxicillin -clavulanate (AUGMENTIN ) 875-125 MG tablet 1 tablet, Oral, Every 12 hours   benzonatate  (TESSALON ) 100 mg, Oral, Every 8 hours   cetirizine  (ZYRTEC ) 10 mg, Oral, Daily   fluticasone  (FLONASE ) 50 MCG/ACT nasal spray 1 spray, Each Nare, Daily   hydrOXYzine  (ATARAX ) 12.5-25 mg, Oral, Every 8 hours PRN   omeprazole  (PRILOSEC) 40 mg, Oral, Daily    Allergies[1]  Past Medical History:  Diagnosis Date   Anemia    Anxiety    Anxiety    GERD (gastroesophageal reflux disease)    Panic attacks    Vertigo      Past Surgical History:  Procedure Laterality Date   NO PAST SURGERIES      Family History  Adopted: Yes  Problem Relation Age of Onset   Diabetes Mother    Hypertension Mother    Heart disease Father    Seizures Father    Diabetes Sister    Seizures Other    Heart failure Other     Social History   Occupational History    Employer: [employer]    Comment: [employer]  Tobacco Use   Smoking status: Never   Smokeless tobacco: Never  Vaping Use   Vaping status: Never Used  Substance and Sexual Activity   Alcohol use: Not Currently   Drug use: No   Sexual activity: Not Currently    Birth control/protection: None     ROS   Objective:    Vitals: BP 122/87 (BP Location: Left Arm)   Pulse 95   Temp 98 F (36.7 C) (Oral)   Resp 18   Ht 5' 4 (1.626 m)   Wt 280 lb (127 kg)   LMP 07/22/2024 (Within Weeks)   SpO2 98%   BMI 48.06 kg/m   Physical Exam Constitutional:      General: She is not in acute distress.    Appearance: Normal appearance. She is well-developed. She is not ill-appearing, toxic-appearing or diaphoretic.  HENT:     Head: Normocephalic and atraumatic.      Nose: Nose normal.     Mouth/Throat:     Mouth: Mucous membranes are moist.  Eyes:     General: No scleral icterus.       Right eye: No discharge.        Left eye: No discharge.     Extraocular Movements: Extraocular movements intact.  Cardiovascular:     Rate and Rhythm: Normal rate.  Pulmonary:     Effort: Pulmonary effort is normal.  Skin:    General: Skin is warm and dry.  Neurological:     General: No focal deficit present.     Mental Status: She is alert and oriented to person, place, and time.  Psychiatric:  Mood and Affect: Mood normal.        Behavior: Behavior normal.     Assessment and Plan :   PDMP not reviewed this encounter.  1. Infected nasal piercing      Start doxycycline  to address infected nasal piercing.  Use supportive care otherwise.  Counseled patient on potential for adverse effects with medications prescribed/recommended today, ER and return-to-clinic precautions discussed, patient verbalized understanding.     [1]  Allergies Allergen Reactions   Nsaids     Gastro problems so was told not to take     Breanna Thomas, Breanna Thomas 08/07/24 0813  "

## 2024-08-04 NOTE — ED Triage Notes (Signed)
 Pt states that she has some drainage from her nose piercing. X2 days  Pt states that she recently had a cold which has since resolved and now has some pus draining from the piercing.

## 2024-08-04 NOTE — Discharge Instructions (Signed)
 Keep the area clean and dry. Start doxycycline  for the infected nasal piercing.

## 2024-08-17 ENCOUNTER — Ambulatory Visit: Admitting: Gastroenterology

## 2024-09-07 ENCOUNTER — Ambulatory Visit: Admitting: Gastroenterology
